# Patient Record
Sex: Male | Born: 1975 | Race: White | Hispanic: No | Marital: Single | Smoking: Former smoker
Health system: Southern US, Community
[De-identification: ages and names within clinical notes are randomized; demographics above are authoritative.]

## PROBLEM LIST (undated history)

## (undated) DIAGNOSIS — D6861 Antiphospholipid syndrome: Secondary | ICD-10-CM

## (undated) DIAGNOSIS — E119 Type 2 diabetes mellitus without complications: Secondary | ICD-10-CM

## (undated) DIAGNOSIS — Z87891 Personal history of nicotine dependence: Secondary | ICD-10-CM

## (undated) DIAGNOSIS — Z5189 Encounter for other specified aftercare: Secondary | ICD-10-CM

## (undated) DIAGNOSIS — Z0000 Encounter for general adult medical examination without abnormal findings: Principal | ICD-10-CM

## (undated) DIAGNOSIS — G4730 Sleep apnea, unspecified: Secondary | ICD-10-CM

## (undated) DIAGNOSIS — R760 Raised antibody titer: Secondary | ICD-10-CM

## (undated) DIAGNOSIS — T79A0XA Compartment syndrome, unspecified, initial encounter: Secondary | ICD-10-CM

## (undated) DIAGNOSIS — F209 Schizophrenia, unspecified: Secondary | ICD-10-CM

## (undated) DIAGNOSIS — H6120 Impacted cerumen, unspecified ear: Secondary | ICD-10-CM

## (undated) DIAGNOSIS — K5900 Constipation, unspecified: Secondary | ICD-10-CM

## (undated) DIAGNOSIS — E049 Nontoxic goiter, unspecified: Secondary | ICD-10-CM

## (undated) DIAGNOSIS — D649 Anemia, unspecified: Secondary | ICD-10-CM

## (undated) DIAGNOSIS — E669 Obesity, unspecified: Secondary | ICD-10-CM

## (undated) DIAGNOSIS — R030 Elevated blood-pressure reading, without diagnosis of hypertension: Secondary | ICD-10-CM

## (undated) DIAGNOSIS — F419 Anxiety disorder, unspecified: Secondary | ICD-10-CM

## (undated) DIAGNOSIS — L0390 Cellulitis, unspecified: Secondary | ICD-10-CM

## (undated) DIAGNOSIS — E785 Hyperlipidemia, unspecified: Secondary | ICD-10-CM

## (undated) DIAGNOSIS — F32A Depression, unspecified: Secondary | ICD-10-CM

## (undated) DIAGNOSIS — F329 Major depressive disorder, single episode, unspecified: Secondary | ICD-10-CM

## (undated) HISTORY — DX: Sleep apnea, unspecified: G47.30

## (undated) HISTORY — DX: Impacted cerumen, unspecified ear: H61.20

## (undated) HISTORY — PX: I & D EXTREMITY: SHX5045

## (undated) HISTORY — DX: Encounter for other specified aftercare: Z51.89

## (undated) HISTORY — PX: WISDOM TOOTH EXTRACTION: SHX21

## (undated) HISTORY — DX: Depression, unspecified: F32.A

## (undated) HISTORY — DX: Encounter for general adult medical examination without abnormal findings: Z00.00

## (undated) HISTORY — DX: Anxiety disorder, unspecified: F41.9

## (undated) HISTORY — DX: Cellulitis, unspecified: L03.90

## (undated) HISTORY — DX: Nontoxic goiter, unspecified: E04.9

## (undated) HISTORY — DX: Type 2 diabetes mellitus without complications: E11.9

## (undated) HISTORY — DX: Constipation, unspecified: K59.00

## (undated) HISTORY — DX: Obesity, unspecified: E66.9

## (undated) HISTORY — DX: Personal history of nicotine dependence: Z87.891

## (undated) HISTORY — DX: Raised antibody titer: R76.0

## (undated) HISTORY — DX: Anemia, unspecified: D64.9

## (undated) HISTORY — DX: Elevated blood-pressure reading, without diagnosis of hypertension: R03.0

## (undated) HISTORY — PX: TONSILLECTOMY: SUR1361

## (undated) HISTORY — DX: Hyperlipidemia, unspecified: E78.5

## (undated) HISTORY — PX: LEG SURGERY: SHX1003

## (undated) HISTORY — DX: Antiphospholipid syndrome: D68.61

## (undated) HISTORY — PX: COLONOSCOPY: SHX174

## (undated) HISTORY — DX: Schizophrenia, unspecified: F20.9

## (undated) HISTORY — PX: OTHER SURGICAL HISTORY: SHX169

---
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
## 1998-01-14 ENCOUNTER — Inpatient Hospital Stay (HOSPITAL_COMMUNITY): Admission: EM | Admit: 1998-01-14 | Discharge: 1998-02-04 | Payer: Self-pay | Admitting: Emergency Medicine

## 1998-04-15 ENCOUNTER — Inpatient Hospital Stay (HOSPITAL_COMMUNITY): Admission: RE | Admit: 1998-04-15 | Discharge: 1998-04-19 | Payer: Self-pay | Admitting: *Deleted

## 1999-02-24 ENCOUNTER — Inpatient Hospital Stay (HOSPITAL_COMMUNITY): Admission: EM | Admit: 1999-02-24 | Discharge: 1999-02-25 | Payer: Self-pay | Admitting: Emergency Medicine

## 1999-02-25 ENCOUNTER — Inpatient Hospital Stay (HOSPITAL_COMMUNITY): Admission: AD | Admit: 1999-02-25 | Discharge: 1999-03-21 | Payer: Self-pay | Admitting: *Deleted

## 2000-04-27 ENCOUNTER — Encounter: Payer: Self-pay | Admitting: Internal Medicine

## 2000-04-27 ENCOUNTER — Encounter: Admission: RE | Admit: 2000-04-27 | Discharge: 2000-04-27 | Payer: Self-pay | Admitting: Internal Medicine

## 2004-12-21 ENCOUNTER — Encounter: Admission: RE | Admit: 2004-12-21 | Discharge: 2004-12-21 | Payer: Self-pay | Admitting: Internal Medicine

## 2006-01-04 ENCOUNTER — Encounter: Admission: RE | Admit: 2006-01-04 | Discharge: 2006-01-04 | Payer: Self-pay

## 2006-03-29 ENCOUNTER — Encounter: Admission: RE | Admit: 2006-03-29 | Discharge: 2006-06-27 | Payer: Self-pay | Admitting: Psychiatry

## 2006-08-02 ENCOUNTER — Encounter: Admission: RE | Admit: 2006-08-02 | Discharge: 2006-10-31 | Payer: Self-pay | Admitting: Psychiatry

## 2006-10-30 ENCOUNTER — Encounter: Admission: RE | Admit: 2006-10-30 | Discharge: 2007-01-28 | Payer: Self-pay | Admitting: Psychiatry

## 2007-03-05 ENCOUNTER — Encounter: Admission: RE | Admit: 2007-03-05 | Discharge: 2007-06-03 | Payer: Self-pay | Admitting: Psychiatry

## 2007-06-27 ENCOUNTER — Encounter: Admission: RE | Admit: 2007-06-27 | Discharge: 2007-09-25 | Payer: Self-pay | Admitting: Psychiatry

## 2007-11-21 ENCOUNTER — Encounter: Admission: RE | Admit: 2007-11-21 | Discharge: 2008-02-19 | Payer: Self-pay | Admitting: Psychiatry

## 2008-04-16 ENCOUNTER — Encounter: Admission: RE | Admit: 2008-04-16 | Discharge: 2008-06-16 | Payer: Self-pay | Admitting: Psychiatry

## 2008-06-24 ENCOUNTER — Encounter: Admission: RE | Admit: 2008-06-24 | Discharge: 2008-07-27 | Payer: Self-pay | Admitting: Psychiatry

## 2008-07-28 ENCOUNTER — Encounter: Admission: RE | Admit: 2008-07-28 | Discharge: 2008-10-26 | Payer: Self-pay | Admitting: Psychiatry

## 2008-10-28 ENCOUNTER — Encounter: Admission: RE | Admit: 2008-10-28 | Discharge: 2008-10-28 | Payer: Self-pay | Admitting: Psychiatry

## 2009-01-04 ENCOUNTER — Encounter: Admission: RE | Admit: 2009-01-04 | Discharge: 2009-04-04 | Payer: Self-pay | Admitting: Psychiatry

## 2009-04-06 ENCOUNTER — Encounter: Admission: RE | Admit: 2009-04-06 | Discharge: 2009-07-05 | Payer: Self-pay | Admitting: Psychiatry

## 2009-07-06 ENCOUNTER — Encounter: Admission: RE | Admit: 2009-07-06 | Discharge: 2009-07-06 | Payer: Self-pay | Admitting: Psychiatry

## 2009-08-30 ENCOUNTER — Encounter: Admission: RE | Admit: 2009-08-30 | Discharge: 2009-08-30 | Payer: Self-pay | Admitting: Internal Medicine

## 2009-09-14 ENCOUNTER — Encounter: Admission: RE | Admit: 2009-09-14 | Discharge: 2009-09-22 | Payer: Self-pay | Admitting: Psychiatry

## 2009-11-09 ENCOUNTER — Encounter: Admission: RE | Admit: 2009-11-09 | Discharge: 2009-11-09 | Payer: Self-pay | Admitting: Psychiatry

## 2010-02-08 ENCOUNTER — Encounter: Admission: RE | Admit: 2010-02-08 | Discharge: 2010-02-08 | Payer: Self-pay | Admitting: Psychiatry

## 2010-05-11 ENCOUNTER — Encounter: Admission: RE | Admit: 2010-05-11 | Discharge: 2010-06-23 | Payer: Self-pay | Admitting: Psychiatry

## 2010-07-12 ENCOUNTER — Encounter
Admission: RE | Admit: 2010-07-12 | Discharge: 2010-07-12 | Payer: Self-pay | Source: Home / Self Care | Attending: Psychiatry | Admitting: Psychiatry

## 2010-10-13 ENCOUNTER — Encounter
Admission: RE | Admit: 2010-10-13 | Discharge: 2010-10-25 | Payer: Self-pay | Source: Home / Self Care | Attending: Psychiatry | Admitting: Psychiatry

## 2011-01-12 ENCOUNTER — Encounter: Payer: Medicare Other | Attending: Psychiatry | Admitting: *Deleted

## 2011-01-12 ENCOUNTER — Encounter: Admit: 2011-01-12 | Payer: Self-pay | Admitting: Psychiatry

## 2011-01-12 DIAGNOSIS — Z713 Dietary counseling and surveillance: Secondary | ICD-10-CM | POA: Insufficient documentation

## 2011-01-12 DIAGNOSIS — E669 Obesity, unspecified: Secondary | ICD-10-CM | POA: Insufficient documentation

## 2011-03-15 ENCOUNTER — Emergency Department (HOSPITAL_COMMUNITY): Payer: Medicare Other

## 2011-03-15 ENCOUNTER — Inpatient Hospital Stay (HOSPITAL_COMMUNITY)
Admission: EM | Admit: 2011-03-15 | Discharge: 2011-03-24 | DRG: 464 | Disposition: A | Discharge status: Home Health | Payer: Medicare Other | Attending: Internal Medicine | Admitting: Internal Medicine

## 2011-03-15 ENCOUNTER — Encounter (HOSPITAL_COMMUNITY): Payer: Self-pay | Admitting: Radiology

## 2011-03-15 DIAGNOSIS — IMO0001 Reserved for inherently not codable concepts without codable children: Secondary | ICD-10-CM | POA: Diagnosis present

## 2011-03-15 DIAGNOSIS — F209 Schizophrenia, unspecified: Secondary | ICD-10-CM | POA: Diagnosis present

## 2011-03-15 DIAGNOSIS — K5900 Constipation, unspecified: Secondary | ICD-10-CM | POA: Diagnosis present

## 2011-03-15 DIAGNOSIS — R7401 Elevation of levels of liver transaminase levels: Secondary | ICD-10-CM | POA: Diagnosis present

## 2011-03-15 DIAGNOSIS — D62 Acute posthemorrhagic anemia: Secondary | ICD-10-CM | POA: Diagnosis not present

## 2011-03-15 DIAGNOSIS — M7989 Other specified soft tissue disorders: Secondary | ICD-10-CM

## 2011-03-15 DIAGNOSIS — D509 Iron deficiency anemia, unspecified: Secondary | ICD-10-CM | POA: Diagnosis present

## 2011-03-15 DIAGNOSIS — M6282 Rhabdomyolysis: Secondary | ICD-10-CM | POA: Diagnosis present

## 2011-03-15 DIAGNOSIS — L03119 Cellulitis of unspecified part of limb: Secondary | ICD-10-CM | POA: Diagnosis present

## 2011-03-15 DIAGNOSIS — E871 Hypo-osmolality and hyponatremia: Secondary | ICD-10-CM | POA: Diagnosis present

## 2011-03-15 DIAGNOSIS — R7402 Elevation of levels of lactic acid dehydrogenase (LDH): Secondary | ICD-10-CM | POA: Diagnosis present

## 2011-03-15 DIAGNOSIS — E785 Hyperlipidemia, unspecified: Secondary | ICD-10-CM | POA: Diagnosis present

## 2011-03-15 DIAGNOSIS — L02419 Cutaneous abscess of limb, unspecified: Secondary | ICD-10-CM | POA: Diagnosis present

## 2011-03-15 DIAGNOSIS — R51 Headache: Secondary | ICD-10-CM | POA: Diagnosis not present

## 2011-03-15 DIAGNOSIS — M79A29 Nontraumatic compartment syndrome of unspecified lower extremity: Principal | ICD-10-CM | POA: Diagnosis present

## 2011-03-15 LAB — COMPREHENSIVE METABOLIC PANEL
ALT: 121 U/L — ABNORMAL HIGH (ref 0–53)
AST: 252 U/L — ABNORMAL HIGH (ref 0–37)
Albumin: 3.3 g/dL — ABNORMAL LOW (ref 3.5–5.2)
Alkaline Phosphatase: 50 U/L (ref 39–117)
BUN: 12 mg/dL (ref 6–23)
CO2: 26 mEq/L (ref 19–32)
Calcium: 8.4 mg/dL (ref 8.4–10.5)
Chloride: 94 mEq/L — ABNORMAL LOW (ref 96–112)
Creatinine, Ser: 0.84 mg/dL (ref 0.50–1.35)
GFR calc Af Amer: >60 mL/min (ref >60)
GFR calc non Af Amer: >60 mL/min (ref >60)
Glucose, Bld: 104 mg/dL — ABNORMAL HIGH (ref 70–99)
Potassium: 3.9 mEq/L (ref 3.5–5.1)
Sodium: 130 mEq/L — ABNORMAL LOW (ref 135–145)
Total Bilirubin: 0.3 mg/dL (ref 0.3–1.2)
Total Protein: 7 g/dL (ref 6.0–8.3)

## 2011-03-15 LAB — CBC
HCT: 35.8 % — ABNORMAL LOW (ref 39.0–52.0)
Hemoglobin: 12.2 g/dL — ABNORMAL LOW (ref 13.0–17.0)
MCH: 31 pg (ref 26.0–34.0)
MCHC: 34.1 g/dL (ref 30.0–36.0)
MCV: 90.9 fL (ref 78.0–100.0)
Platelets: 239 10*3/uL (ref 150–400)
RBC: 3.94 MIL/uL — ABNORMAL LOW (ref 4.22–5.81)
RDW: 12.8 % (ref 11.5–15.5)
WBC: 14.5 10*3/uL — ABNORMAL HIGH (ref 4.0–10.5)

## 2011-03-15 LAB — DIFFERENTIAL
Basophils Absolute: 0 10*3/uL (ref 0.0–0.1)
Basophils Relative: 0 % (ref 0–1)
Eosinophils Absolute: 0.1 10*3/uL (ref 0.0–0.7)
Eosinophils Relative: 1 % (ref 0–5)
Lymphocytes Relative: 11 % — ABNORMAL LOW (ref 12–46)
Lymphs Abs: 1.6 10*3/uL (ref 0.7–4.0)
Monocytes Absolute: 2 10*3/uL — ABNORMAL HIGH (ref 0.1–1.0)
Monocytes Relative: 14 % — ABNORMAL HIGH (ref 3–12)
Neutro Abs: 10.7 10*3/uL — ABNORMAL HIGH (ref 1.7–7.7)
Neutrophils Relative %: 74 % (ref 43–77)

## 2011-03-15 LAB — CK: Total CK: 10781 U/L — ABNORMAL HIGH (ref 7–232)

## 2011-03-15 MED ORDER — IOHEXOL 300 MG/ML  SOLN
100.0000 mL | Freq: Once | INTRAMUSCULAR | Status: AC | PRN
  Administered 2011-03-15: 100 mL via INTRAVENOUS

## 2011-03-16 ENCOUNTER — Inpatient Hospital Stay (HOSPITAL_COMMUNITY): Payer: Medicare Other

## 2011-03-16 ENCOUNTER — Other Ambulatory Visit: Payer: Self-pay | Admitting: Orthopedic Surgery

## 2011-03-16 LAB — BASIC METABOLIC PANEL
BUN: 13 mg/dL (ref 6–23)
CO2: 27 mEq/L (ref 19–32)
Calcium: 7.6 mg/dL — ABNORMAL LOW (ref 8.4–10.5)
Chloride: 103 mEq/L (ref 96–112)
Creatinine, Ser: 0.89 mg/dL (ref 0.50–1.35)
GFR calc Af Amer: >60 mL/min (ref >60)
GFR calc non Af Amer: >60 mL/min (ref >60)
Glucose, Bld: 109 mg/dL — ABNORMAL HIGH (ref 70–99)
Potassium: 4.1 mEq/L (ref 3.5–5.1)
Sodium: 137 mEq/L (ref 135–145)

## 2011-03-16 LAB — HEPATIC FUNCTION PANEL
ALT: 94 U/L — ABNORMAL HIGH (ref 0–53)
AST: 176 U/L — ABNORMAL HIGH (ref 0–37)
Albumin: 2.5 g/dL — ABNORMAL LOW (ref 3.5–5.2)
Alkaline Phosphatase: 53 U/L (ref 39–117)
Bilirubin, Direct: <0.1 mg/dL (ref 0.0–0.3)
Total Bilirubin: 0.2 mg/dL — ABNORMAL LOW (ref 0.3–1.2)
Total Protein: 5.7 g/dL — ABNORMAL LOW (ref 6.0–8.3)

## 2011-03-16 LAB — POCT I-STAT 3, ART BLOOD GAS (G3+)
Acid-Base Excess: 1 mmol/L (ref 0.0–2.0)
Bicarbonate: 27.5 mEq/L — ABNORMAL HIGH (ref 20.0–24.0)
O2 Saturation: 95 %
Patient temperature: 98.6
TCO2: 29 mmol/L (ref 0–100)
pCO2 arterial: 50.3 mmHg — ABNORMAL HIGH (ref 35.0–45.0)
pH, Arterial: 7.345 — ABNORMAL LOW (ref 7.350–7.450)
pO2, Arterial: 81 mmHg (ref 80.0–100.0)

## 2011-03-16 LAB — CBC
HCT: 31.8 % — ABNORMAL LOW (ref 39.0–52.0)
Hemoglobin: 10.5 g/dL — ABNORMAL LOW (ref 13.0–17.0)
MCH: 30.5 pg (ref 26.0–34.0)
MCHC: 33 g/dL (ref 30.0–36.0)
MCV: 92.4 fL (ref 78.0–100.0)
Platelets: 217 10*3/uL (ref 150–400)
RBC: 3.44 MIL/uL — ABNORMAL LOW (ref 4.22–5.81)
RDW: 13.1 % (ref 11.5–15.5)
WBC: 10 10*3/uL (ref 4.0–10.5)

## 2011-03-16 LAB — DIFFERENTIAL
Basophils Absolute: 0 10*3/uL (ref 0.0–0.1)
Basophils Relative: 0 % (ref 0–1)
Eosinophils Absolute: 0.1 10*3/uL (ref 0.0–0.7)
Eosinophils Relative: 1 % (ref 0–5)
Lymphocytes Relative: 12 % (ref 12–46)
Lymphs Abs: 1.2 10*3/uL (ref 0.7–4.0)
Monocytes Absolute: 1.4 10*3/uL — ABNORMAL HIGH (ref 0.1–1.0)
Monocytes Relative: 14 % — ABNORMAL HIGH (ref 3–12)
Neutro Abs: 7.4 10*3/uL (ref 1.7–7.7)
Neutrophils Relative %: 74 % (ref 43–77)

## 2011-03-16 LAB — MRSA PCR SCREENING: MRSA by PCR: NEGATIVE

## 2011-03-16 LAB — AMMONIA: Ammonia: 45 umol/L (ref 11–60)

## 2011-03-16 LAB — VALPROIC ACID LEVEL: Valproic Acid Lvl: 24.3 ug/mL — ABNORMAL LOW (ref 50.0–100.0)

## 2011-03-16 LAB — MAGNESIUM: Magnesium: 2.1 mg/dL (ref 1.5–2.5)

## 2011-03-17 ENCOUNTER — Ambulatory Visit: Payer: Medicare Other | Admitting: Internal Medicine

## 2011-03-17 ENCOUNTER — Inpatient Hospital Stay (HOSPITAL_COMMUNITY): Payer: Medicare Other

## 2011-03-17 LAB — DIFFERENTIAL
Basophils Absolute: 0 10*3/uL (ref 0.0–0.1)
Basophils Relative: 0 % (ref 0–1)
Eosinophils Absolute: 0.2 10*3/uL (ref 0.0–0.7)
Eosinophils Relative: 2 % (ref 0–5)
Lymphocytes Relative: 15 % (ref 12–46)
Lymphs Abs: 1.4 10*3/uL (ref 0.7–4.0)
Monocytes Absolute: 1.5 10*3/uL — ABNORMAL HIGH (ref 0.1–1.0)
Monocytes Relative: 16 % — ABNORMAL HIGH (ref 3–12)
Neutro Abs: 6.2 10*3/uL (ref 1.7–7.7)
Neutrophils Relative %: 67 % (ref 43–77)

## 2011-03-17 LAB — CBC
HCT: 29.2 % — ABNORMAL LOW (ref 39.0–52.0)
Hemoglobin: 10.1 g/dL — ABNORMAL LOW (ref 13.0–17.0)
MCH: 32.2 pg (ref 26.0–34.0)
MCHC: 34.6 g/dL (ref 30.0–36.0)
MCV: 93 fL (ref 78.0–100.0)
Platelets: 234 10*3/uL (ref 150–400)
RBC: 3.14 MIL/uL — ABNORMAL LOW (ref 4.22–5.81)
RDW: 13.2 % (ref 11.5–15.5)
WBC: 9.2 10*3/uL (ref 4.0–10.5)

## 2011-03-17 LAB — COMPREHENSIVE METABOLIC PANEL
ALT: 83 U/L — ABNORMAL HIGH (ref 0–53)
AST: 94 U/L — ABNORMAL HIGH (ref 0–37)
Albumin: 2.3 g/dL — ABNORMAL LOW (ref 3.5–5.2)
Alkaline Phosphatase: 55 U/L (ref 39–117)
BUN: 11 mg/dL (ref 6–23)
CO2: 29 mEq/L (ref 19–32)
Calcium: 7.9 mg/dL — ABNORMAL LOW (ref 8.4–10.5)
Chloride: 101 mEq/L (ref 96–112)
Creatinine, Ser: 0.81 mg/dL (ref 0.50–1.35)
GFR calc Af Amer: >60 mL/min (ref >60)
GFR calc non Af Amer: >60 mL/min (ref >60)
Glucose, Bld: 185 mg/dL — ABNORMAL HIGH (ref 70–99)
Potassium: 3.9 mEq/L (ref 3.5–5.1)
Sodium: 135 mEq/L (ref 135–145)
Total Bilirubin: 0.1 mg/dL — ABNORMAL LOW (ref 0.3–1.2)
Total Protein: 5.6 g/dL — ABNORMAL LOW (ref 6.0–8.3)

## 2011-03-17 LAB — HEPATITIS PANEL, ACUTE
HCV Ab: NEGATIVE
Hep A IgM: NEGATIVE
Hep B C IgM: NEGATIVE
Hepatitis B Surface Ag: NEGATIVE

## 2011-03-17 LAB — PRO B NATRIURETIC PEPTIDE: Pro B Natriuretic peptide (BNP): 46.9 pg/mL (ref 0–125)

## 2011-03-17 LAB — PHOSPHORUS: Phosphorus: 2.9 mg/dL (ref 2.3–4.6)

## 2011-03-17 LAB — MAGNESIUM: Magnesium: 2.2 mg/dL (ref 1.5–2.5)

## 2011-03-17 LAB — CK: Total CK: 3523 U/L — ABNORMAL HIGH (ref 7–232)

## 2011-03-18 LAB — CK: Total CK: 1008 U/L — ABNORMAL HIGH (ref 7–232)

## 2011-03-18 LAB — CBC
HCT: 26.8 % — ABNORMAL LOW (ref 39.0–52.0)
Hemoglobin: 8.9 g/dL — ABNORMAL LOW (ref 13.0–17.0)
MCH: 30.7 pg (ref 26.0–34.0)
MCHC: 33.2 g/dL (ref 30.0–36.0)
MCV: 92.4 fL (ref 78.0–100.0)
Platelets: 267 10*3/uL (ref 150–400)
RBC: 2.9 MIL/uL — ABNORMAL LOW (ref 4.22–5.81)
RDW: 13 % (ref 11.5–15.5)
WBC: 8.7 10*3/uL (ref 4.0–10.5)

## 2011-03-19 LAB — VITAMIN B12: Vitamin B-12: 589 pg/mL (ref 211–911)

## 2011-03-19 LAB — CK TOTAL AND CKMB (NOT AT ARMC)
CK, MB: 2.4 ng/mL (ref 0.3–4.0)
Relative Index: 0.7 (ref 0.0–2.5)
Total CK: 360 U/L — ABNORMAL HIGH (ref 7–232)

## 2011-03-19 LAB — COMPREHENSIVE METABOLIC PANEL
ALT: 50 U/L (ref 0–53)
AST: 26 U/L (ref 0–37)
Albumin: 2 g/dL — ABNORMAL LOW (ref 3.5–5.2)
Alkaline Phosphatase: 43 U/L (ref 39–117)
BUN: 12 mg/dL (ref 6–23)
CO2: 29 mEq/L (ref 19–32)
Calcium: 8 mg/dL — ABNORMAL LOW (ref 8.4–10.5)
Chloride: 103 mEq/L (ref 96–112)
Creatinine, Ser: 0.8 mg/dL (ref 0.50–1.35)
GFR calc Af Amer: >60 mL/min (ref >60)
GFR calc non Af Amer: >60 mL/min (ref >60)
Glucose, Bld: 101 mg/dL — ABNORMAL HIGH (ref 70–99)
Potassium: 3.8 mEq/L (ref 3.5–5.1)
Sodium: 139 mEq/L (ref 135–145)
Total Bilirubin: 0.1 mg/dL — ABNORMAL LOW (ref 0.3–1.2)
Total Protein: 5.4 g/dL — ABNORMAL LOW (ref 6.0–8.3)

## 2011-03-19 LAB — CBC
HCT: 25.9 % — ABNORMAL LOW (ref 39.0–52.0)
Hemoglobin: 9 g/dL — ABNORMAL LOW (ref 13.0–17.0)
MCH: 31.7 pg (ref 26.0–34.0)
MCHC: 34.7 g/dL (ref 30.0–36.0)
MCV: 91.2 fL (ref 78.0–100.0)
Platelets: 281 10*3/uL (ref 150–400)
RBC: 2.84 MIL/uL — ABNORMAL LOW (ref 4.22–5.81)
RDW: 13 % (ref 11.5–15.5)
WBC: 7.9 10*3/uL (ref 4.0–10.5)

## 2011-03-19 LAB — DIFFERENTIAL
Basophils Absolute: 0 10*3/uL (ref 0.0–0.1)
Basophils Relative: 0 % (ref 0–1)
Eosinophils Absolute: 0.5 10*3/uL (ref 0.0–0.7)
Eosinophils Relative: 6 % — ABNORMAL HIGH (ref 0–5)
Lymphocytes Relative: 30 % (ref 12–46)
Lymphs Abs: 2.4 10*3/uL (ref 0.7–4.0)
Monocytes Absolute: 0.8 10*3/uL (ref 0.1–1.0)
Monocytes Relative: 10 % (ref 3–12)
Neutro Abs: 4.3 10*3/uL (ref 1.7–7.7)
Neutrophils Relative %: 54 % (ref 43–77)

## 2011-03-19 LAB — IRON AND TIBC
Iron: 28 ug/dL — ABNORMAL LOW (ref 42–135)
Saturation Ratios: 14 % — ABNORMAL LOW (ref 20–55)
TIBC: 204 ug/dL — ABNORMAL LOW (ref 215–435)
UIBC: 176 ug/dL

## 2011-03-19 LAB — VANCOMYCIN, TROUGH: Vancomycin Tr: 5.8 ug/mL — ABNORMAL LOW (ref 10.0–20.0)

## 2011-03-19 LAB — HIV ANTIBODY (ROUTINE TESTING W REFLEX): HIV: NONREACTIVE

## 2011-03-19 LAB — FOLATE: Folate: 9.7 ng/mL

## 2011-03-19 LAB — FERRITIN: Ferritin: 190 ng/mL (ref 22–322)

## 2011-03-20 ENCOUNTER — Inpatient Hospital Stay (HOSPITAL_COMMUNITY): Payer: Medicare Other

## 2011-03-20 LAB — CBC
HCT: 22.1 % — ABNORMAL LOW (ref 39.0–52.0)
Hemoglobin: 7.5 g/dL — ABNORMAL LOW (ref 13.0–17.0)
MCH: 31 pg (ref 26.0–34.0)
MCHC: 33.9 g/dL (ref 30.0–36.0)
MCV: 91.3 fL (ref 78.0–100.0)
Platelets: 282 10*3/uL (ref 150–400)
RBC: 2.42 MIL/uL — ABNORMAL LOW (ref 4.22–5.81)
RDW: 13.2 % (ref 11.5–15.5)
WBC: 8.1 10*3/uL (ref 4.0–10.5)

## 2011-03-20 LAB — BASIC METABOLIC PANEL
BUN: 11 mg/dL (ref 6–23)
CO2: 30 mEq/L (ref 19–32)
Calcium: 7.8 mg/dL — ABNORMAL LOW (ref 8.4–10.5)
Chloride: 104 mEq/L (ref 96–112)
Creatinine, Ser: 0.74 mg/dL (ref 0.50–1.35)
GFR calc Af Amer: >60 mL/min (ref >60)
GFR calc non Af Amer: >60 mL/min (ref >60)
Glucose, Bld: 103 mg/dL — ABNORMAL HIGH (ref 70–99)
Potassium: 3.8 mEq/L (ref 3.5–5.1)
Sodium: 138 mEq/L (ref 135–145)

## 2011-03-20 LAB — ABO/RH: ABO/RH(D): O NEG

## 2011-03-20 LAB — HEMOGLOBIN: Hemoglobin: 8.2 g/dL — ABNORMAL LOW (ref 13.0–17.0)

## 2011-03-20 NOTE — H&P (Signed)
Joseph [name], Joseph [name]                ACCOUNT NO.:  [id number]  MEDICAL RECORD NO.:  [id number]  LOCATION:  [facility]                         FACILITY:  [facility]  PHYSICIAN:  Eduard Clos, MDDATE OF BIRTH:  1975-10-20  DATE OF ADMISSION:  03/15/2011 DATE OF DISCHARGE:                             HISTORY & PHYSICAL   PRIMARY CARE PHYSICIAN:  [facility] [place].  CHIEF COMPLAINT:  Left leg pain and swelling.  HISTORY OF PRESENT ILLNESS:  A 35 year old male with a history of schizophrenia and hyperlipidemia has been experiencing some swelling in the left lower extremity on Saturday, that is almost 5 days ago.  The patient states that the weekend he did mow the lawn.  He did not notice any trauma or any insect bite.  His left anterior shin started swelling slowly with fever and chills which progressively got worst involving his left foot.  He came to the ER yesterday and he had Dopplers done which is not showing a definite DVT.  His CBC shows leukocytosis, so at this time he has been admitted for cellulitis.  CAT scan of the left lower extremity done eventually showed features of cellulitis and myositis. His CK has been very high at this time and is also concerning for any compartment syndrome.  Orthopedic Dr. Dorene Grebe has been already consulted by ER physician who is going to see the patient soon.  The patient does have some pain in the left lower extremity which is relieved by Dilaudid.  Denies any chest pain, shortness of breath, nausea, vomiting, abdominal pain, dysuria, discharge, diarrhea.  Denies any dizziness or loss of consciousness.  PAST MEDICAL HISTORY:  History of schizophrenia and hyperlipidemia.  MEDICATIONS PRIOR TO ADMISSION: 1. Aleve 220 mg as needed p.r.n. 2. Clonazepam 1 mg p.o. at bedtime. 3. Depakote 1000 mg at bedtime. 4. Docusate. 5. Fenofibrate 134 mg p.o. at bedtime. 6. Klonopin 1 mg twice daily. 7. Multivitamins p.o. daily. 8. Pravastatin 40 mg  daily. 9. Prozac 20 mg daily. 10.Zyprexa 20 mg at bedtime.  ALLERGIES:  HALDOL.  SOCIAL HISTORY:  The patient denies smoking cigarettes, drinking alcohol or use illegal drugs.  Lives in a group home.  FAMILY HISTORY:  Significant for grandparents having MI.  REVIEW OF SYSTEMS:  As per the history of present illness nothing else significant.  PHYSICAL EXAMINATION:  GENERAL:  The patient examined at bedside not in acute distress. VITAL SIGNS:  Blood pressure 112/70, pulse is 97 per minute, temperature 98.6, respirations 18 per minute, O2 sat 94%. HEENT:  Anicteric.  No pallor.  No discharge from ears, eyes, nose or mouth. CHEST:  Bilateral air entry present.  No rhonchi.  No crepitation. HEART:  S1 and S2 heard. ABDOMEN:  Soft, nontender.  Bowel sounds heard. CNS:  The patient is alert, awake and oriented to time, place, and person.  Moves upper and lower extremity 5/5. EXTREMITIES:  There is swelling in the left anterior shin up to his left foot.  In the anterior shin has a very erythematous area involving 10 cm to 5 cm which is also tender.  At this time does not show any acute ischemic changes, cyanosis or clubbing.  Pulses  are feeble.  Right lower extremity looks normal.  Upper extremities look normal.  LABORATORY FINDINGS:  Doppler of the lower extremities done, preliminary report shows his left lower extremity venous completed.  No obvious evidence of DVT, superficial thrombosis or Baker cyst in the left lower extremity.  Cannot completely rule out DVT in the left PTV and peroneal veins due to nonvisualization.  Swelling and pressure in calf may be compressing them.  CT tibia and fibula of the left lower extremity. Impression, findings consistent with cellulitis, edema in the tibialis, anterior extensor digitorum longus and tibialis posterior muscles is consistent with myositis.  No discrete rim-enhancing fluid collection to suggest abscess as identified.  CBC; WBCs 14.5,  hemoglobin is 2.2, hematocrit is 35.8, platelets 239.  Complete metabolic panel; sodium 130, potassium 3.9, chloride 94, carbon dioxide 26, glucose 104, BUN 12, creatinine 0.8, total bilirubin is 0.3, alka phos 50, AST 252, ALT 121, total protein 7, albumin 3.3, calcium 8.4, [place] [address].  ASSESSMENT: 1. Cellulitis of the left lower extremity with myositis. 2. Clinical features concerning for compartment syndrome. 3. Increased CPK and LFTs, may be related to rhabdomyolysis and also     #2 diagnosis. 4. History of schizophrenia. 5. History of hyperlipidemia.  PLAN: 1. At this time I will admit the patient to medical floor. 2. For his cellulitis, the patient has been already started on vanc     and Zosyn without clindamycin.  At this time patient's CAT scan     does show features of myositis and CK being high is concerning for     compartment syndrome.  At this time the patient does have sensation     in his left lower extremity.  Dr. Dorene Grebe of orthopedic has been     already consulted by ER physician Dr. Denton Lank.  Dr. Dorene Grebe is     going to see the patient very soon to make sure there is no     compartment syndrome.  He will follow the recommendations with     regarding this.  At this time his CK is high, his LFTs also high     which may be part of his rhabdo secondary to his myositis.  I am     going to aggressively hydrate the patient and get a UA, closely     follow his CK and his LFTs.  At this time I am ordering acute     hepatitis panel.  As the patient is on pravastatin, we will     discontinue     because of his high CK levels.  His LFTs also high.  We will check     ammonia level as the patient is also on Depakote levels.  His LFTs     may be high because of the rhabdomyolysis.  We will closely follow     his LFTs and CK and further recommendation as condition evolves.     Eduard Clos, MD     ANK/MEDQ  D:  03/16/2011  T:  03/16/2011  Job:   [id number]  cc:   [facility]  Electronically Signed by Midge Minium MD on 03/20/2011 07:45:48 AM

## 2011-03-21 LAB — CBC
HCT: 22 % — ABNORMAL LOW (ref 39.0–52.0)
Hemoglobin: 7.4 g/dL — ABNORMAL LOW (ref 13.0–17.0)
MCH: 30.6 pg (ref 26.0–34.0)
MCHC: 33.6 g/dL (ref 30.0–36.0)
MCV: 90.9 fL (ref 78.0–100.0)
Platelets: 316 10*3/uL (ref 150–400)
RBC: 2.42 MIL/uL — ABNORMAL LOW (ref 4.22–5.81)
RDW: 13.5 % (ref 11.5–15.5)
WBC: 9.2 10*3/uL (ref 4.0–10.5)

## 2011-03-21 LAB — POCT I-STAT 4, (NA,K, GLUC, HGB,HCT)
Glucose, Bld: 128 mg/dL — ABNORMAL HIGH (ref 70–99)
HCT: 30 % — ABNORMAL LOW (ref 39.0–52.0)
Hemoglobin: 10.2 g/dL — ABNORMAL LOW (ref 13.0–17.0)
Potassium: 4.5 mEq/L (ref 3.5–5.1)
Sodium: 137 mEq/L (ref 135–145)

## 2011-03-21 LAB — PREPARE RBC (CROSSMATCH)

## 2011-03-22 LAB — CULTURE, BLOOD (ROUTINE X 2)
Culture  Setup Time: 201206210319
Culture  Setup Time: 201206210319
Culture: NO GROWTH
Culture: NO GROWTH

## 2011-03-22 LAB — TYPE AND SCREEN
ABO/RH(D): O NEG
Antibody Screen: NEGATIVE
Unit division: 0
Unit division: 0

## 2011-03-22 LAB — CBC
HCT: 26.9 % — ABNORMAL LOW (ref 39.0–52.0)
Hemoglobin: 8.9 g/dL — ABNORMAL LOW (ref 13.0–17.0)
MCH: 30 pg (ref 26.0–34.0)
MCHC: 33.1 g/dL (ref 30.0–36.0)
MCV: 90.6 fL (ref 78.0–100.0)
Platelets: 362 10*3/uL (ref 150–400)
RBC: 2.97 MIL/uL — ABNORMAL LOW (ref 4.22–5.81)
RDW: 14.6 % (ref 11.5–15.5)
WBC: 8.9 10*3/uL (ref 4.0–10.5)

## 2011-03-22 LAB — BASIC METABOLIC PANEL
BUN: 13 mg/dL (ref 6–23)
CO2: 26 mEq/L (ref 19–32)
Calcium: 8.1 mg/dL — ABNORMAL LOW (ref 8.4–10.5)
Chloride: 109 mEq/L (ref 96–112)
Creatinine, Ser: 0.95 mg/dL (ref 0.50–1.35)
GFR calc Af Amer: >60 mL/min (ref >60)
GFR calc non Af Amer: >60 mL/min (ref >60)
Glucose, Bld: 128 mg/dL — ABNORMAL HIGH (ref 70–99)
Potassium: 3.8 mEq/L (ref 3.5–5.1)
Sodium: 143 mEq/L (ref 135–145)

## 2011-03-22 LAB — POCT OCCULT BLOOD STOOL (DEVICE): Fecal Occult Bld: NEGATIVE

## 2011-03-22 LAB — VANCOMYCIN, TROUGH: Vancomycin Tr: 9.9 ug/mL — ABNORMAL LOW (ref 10.0–20.0)

## 2011-03-25 NOTE — Discharge Summary (Signed)
  Joseph [name], Joseph [name]                ACCOUNT NO.:  [id number]  MEDICAL RECORD NO.:  [id number]  LOCATION:  5008                         FACILITY:  [facility]  PHYSICIAN:  Lonia Blood, M.D.       DATE OF BIRTH:  1976-03-11  DATE OF ADMISSION:  03/15/2011 DATE OF DISCHARGE:  03/25/2011                              DISCHARGE SUMMARY   PRIMARY CARE PHYSICIAN:  Cala Bradford R. Renae Gloss, MD with [facility].  DISCHARGE DIAGNOSES: 1. Left lower extremity cellulitis with myositis in compartments     syndrome, status post fasciotomy by Dr. Dorene Grebe from     Orthopedics. 2. Acute blood loss anemia postoperatively. 3. Hyperlipidemia. 4. Schizophrenia. 5. Constipation.  DISCHARGE MEDICATIONS: 1. Aleve 1 tablet by mouth twice a day as needed for pain. 2. Klonopin 1 mg by mouth twice a day and 1 mg at bedtime. 3. Depakote Extended Release 1000 mg at bedtime. 4. Colace 100 mg twice a day. 5. Fenofibric acid 135 mg daily. 6. Fluconazole 100 mg daily for 10 days. 7. Multivitamin 1 tablet daily. 8. Percocet 5/325 one to two tablets every 4 hours as needed for pain. 9. Pravachol 40 mg daily. 10.Prozac 20 mg daily. 11.Septra Double Strength 800/160 two tablets twice a day. 12.Flomax 0.4 mg daily. 13.Zyprexa 20 mg at bedtime.  CONDITION ON DISCHARGE:  Joseph [name] is going to be discharged home under the care of his family with home health wound care.  He will follow up with Dr. Dorene Grebe on March 30, 2011, in the office for followup on his leg.  For complete hospital course including radiographic studies, procedures performed, history of present illness, and hospital course, refer to dictated progress note done by Dr. Hartley Barefoot on March 21, 2011.  On March 24, 2011, Joseph [name] wound [facility] was removed.  The wound was found to be healing nicely.  He was dressed as per the instructions of Dr. August Saucer.  The patient had no other complications since the dictation done by Dr. Sunnie Nielsen on March 21, 2011.     Lonia Blood, M.D.     SL/MEDQ  D:  03/24/2011  T:  03/25/2011  Job:  [id number]  Electronically Signed by Lonia Blood M.D. on 03/25/2011 04:47:28 PM

## 2011-03-26 NOTE — Group Therapy Note (Signed)
Joseph [name], Joseph [name]                ACCOUNT NO.:  [id number]  MEDICAL RECORD NO.:  [id number]  LOCATION:                                 FACILITY:  PHYSICIAN:  Hartley Barefoot, MD    DATE OF BIRTH:  1975-10-19                                PROGRESS NOTE   CURRENT DIAGNOSES: 1. Left lower extremity cellulitis, myositis, compartment syndrome. 2. Anemia, acute blood loss postsurgery expected. 3. Hyperlipidemia. 4. Transaminases in the setting of increased total CK. 5. Hyponatremia. 6. Constipation. 7. Schizophrenia.  CURRENT MEDICATIONS: 1. Klonopin 1 mg p.o. b.i.d. 2. Depakote 1000 mg nightly. 3. Docusate 100 mg p.o. b.i.d. 4. Ferrous sulfate 325 mg p.o. b.i.d. 5. Prozac 20 mg p.o. daily. 6. Multivitamins p.o. daily. 7. Zyprexa 20 mg p.o. daily. 8. Zofran. 9. MiraLax p.o. b.i.d. 10.Flomax 0.4 mg p.o. daily. 11.Vancomycin per pharmacy.  RADIOGRAPHIC STUDY: 1. Abdominal x-ray on June 25 showed no acute or specific finding. 2. Chest x-ray June 22, improved air aeration.  No definitive     pneumonia.  Stable cardiomegaly. 3. CT of tibia and fibula left show on June 20, finding consistent     with cellulitis.  Edema in the tibialis anterior, extensor     digitorum longus and tibialis posterior muscle is consistent with     myositis.  No discrete rim enhancing fluid collection to suggest     abscess is identified.  PROCEDURES PERFORMED: 1. On June 21, left leg full compartment fasciotomy with debridement     of anterior compartment devitalized muscle. 2. June 22, debridement of the left leg devitalized anterior     compartment muscle with replication of a wound VAC 3. June 24, left leg I and D with delayed primary closure, a split     thickness skin graft 25 cm x 4 cm.  BRIEF HISTORY OF PRESENT ILLNESS: This is a very pleasant 35 year old with past medical history of schizophrenia, hyperlipidemia has been experiencing some swelling in the left lower extremity that  started 5 days prior to admission.  The patient states that the weekend he did mow the lawn.  He did not notice any trauma or any insect bite.  Hs left anterior shin is sweating slowly with fever and chills which progressively got worse involving his left foot.  He came to the emergency department.  His white blood cell showed leukocytosis,  so at this time he has been admitted for cellulitis.  A CT scan of the left lower extremity show features of cellulitis and myositis.  His CK has been very elevated and he has some clinical findings concerning for compartment syndrome.  Orthopedic Dr. Dorene Grebe has been already consulted by the ER physician who is going to see the patient soon.  HOSPITAL COURSE: 1. Left lower extremity cellulitis, myositis with compartment     syndrome.  The patient was admitted to regular floor.  He was taken     to the OR on the date of admission and he had a left leg full     compartment fasciotomy with debridement of anterior compartment     devitalized muscle.  He has had 2 more debridement procedures.  The     patient has been on IV antibiotics, today he is day 6 of IV van,     Zosyn and clindamycin.  I will discontinue clindamycin today.  I     discussed with Dr. August Saucer and he was recommending to continue the IV     antibiotics while the patient is in the hospital.  We can consider     to discharge him on oral antibiotics but that will depend on how     further debridement and how the tissue looks.  So we will need to     follow Dr. Diamantina Providence recommendation to see if the patient is going to     be able to go home on oral antibiotics. 2. Anemia, acute blood loss expected postsurgery.  The patient's     hemoglobin on admission was 12.2.  Over the course of     hospitalization has decreased.  Today is at 7.4.  I will transfuse     2 units of packed red blood cell.  He was started on ferrous     sulfate.  He had an anemia panel that had some features of iron      deficiency anemia.  We will guaiac stools. 3. Hyperlipidemia, hold statin in the setting of increased total CK. 4. Transaminases resolved.  It has normalized.  A viral hepatitis     panel negative.  I suspect elevated liver enzymes in the setting of     an elevated total CK and muscle enzymes test. 5. Constipation.  KUB was negative.  The patient had a bowel movement     on June 25.  We will continue with bowel regimen. 6. Elevated CK, early rhabdo secondary to cellulitis compartment     syndrome.  The patient's CK level on admission was at 1078 but     over the course of hospitalization has decreased to 360. 7. Hyponatremia.  On admission, the patient's sodium was 130.  He     received IV fluids. Hyponatremia has resolved. 9. History of schizophrenia.  We will continue with home medications.     He is stable at this time.       Hartley Barefoot, MD     BR/MEDQ  D:  03/21/2011  T:  03/22/2011  Job:  [id number]  Electronically Signed by Hartley Barefoot MD on 03/26/2011 09:06:24 AM

## 2011-04-07 NOTE — Op Note (Signed)
  [name], [name]                ACCOUNT NO.:  [id number]  MEDICAL RECORD NO.:  [id number]  LOCATION:  5008                         FACILITY:  [facility]  PHYSICIAN:  Burnard Bunting, M.D.    DATE OF BIRTH:  1976-05-19  DATE OF PROCEDURE:  03/17/2011 DATE OF DISCHARGE:                              OPERATIVE REPORT   PREOPERATIVE DIAGNOSIS:  Left leg compartment syndrome.  POSTOPERATIVE DIAGNOSIS:  Left leg compartment syndrome.  PROCEDURE:  Debridement of left leg devitalized anterior compartment muscle with reapplication of wound VAC.  SURGEON:  Burnard Bunting, MD  ASSISTANT:  None.  ANESTHESIA:  General endotracheal.  ESTIMATED BLOOD LOSS:  200 mL.  DRAINS:  None.  Wound VAC applied.  INDICATIONS:  Joseph [name] is a patient 2 days out, left 4-compartment fasciotomy, did have some dead muscle in the anterior compartment which was debrided at the initial setting, presents now for repeat debridement.  The patient presents now for repeat debridement, possible delayed primary closure.  PROCEDURE IN DETAIL:  The patient was brought to the operating room where general endotracheal anesthesia was induced.  Preoperative antibiotics remained.  Time-out was called.  Left leg was prescrubbed with Hibiclens and draped in sterile manner.  The anterior compartment muscle which remained after the initial compartment release and debridement was completely devitalized, appeared dusky without bleed, would not contract, it was removed.  All muscle from the anterior compartment required debridement.  The incisions were extended distally and proximally. Neurovascular bundle was maintained.  Following a debridement, thorough irrigation was performed and a thrombin spray was applied.  The wound was reapproximated.  About a 25 x 4 cm strip of lateral muscle remained exposed.  Plan is to bring him back in 2-3 days for repeat closure versus skin grafting.    Burnard Bunting, M.D.    GSD/MEDQ   D:  03/17/2011  T:  03/18/2011  Job:  [id number]  Electronically Signed by Reece Agar.  Jiro Kiester M.D. on 04/07/2011 05:38:07 PM

## 2011-04-07 NOTE — Op Note (Signed)
  Joseph [name], Joseph [name]                ACCOUNT NO.:  [id number]  MEDICAL RECORD NO.:  [id number]  LOCATION:                                 FACILITY:  PHYSICIAN:  Burnard Bunting, M.D.    DATE OF BIRTH:  1975/12/10  DATE OF PROCEDURE:  03/19/2011 DATE OF DISCHARGE:                              OPERATIVE REPORT   PREOPERATIVE DIAGNOSIS:  Left leg compartment syndrome with devitalized anterior compartment muscle.  POSTOPERATIVE DIAGNOSIS:  Left leg compartment syndrome with devitalized anterior compartment muscle.  PROCEDURE:  Left leg I and D with delayed primary closure, split thickness skin graft 25 cm x 4 cm.  SURGEON:  Burnard Bunting, MD  ASSISTANT:  None.  ANESTHESIA:  General endotracheal.  BLOOD LOSS:  150 mL.  DRAINS:  None.  INDICATIONS:  This is a third operative trip for Joseph [name] who is a patient with compartment syndrome and who had excision of his anterior compartment musculature and he presents now for continued operative management of this problem.  PROCEDURE IN DETAIL:  The patient was brought to the operating room where general endotracheal anesthesia was induced.  Preoperative IV antibiotics were maintained.  Time-out was called.  Left leg was scrubbed with Hibiclens and saline and draped in sterile manner.  The lateral posterior compartment muscle was viable and intact, it did contract with cauterization.  There was serous fluid collection within the anterior compartment which had several dead space.  Anterior tibial artery remains intact.  Debridement was performed using curettes.  No more devitalized muscle was noted distally or proximally.  Serous fluid collection was removed and 6 liters of irrigating solution were used to irrigate out this anterior compartment bed.  The fascia overlying the tibia in the lateral compartment was then used to close over a Hemovac drain, the dead space which was the anterior compartment.  The skin was then  reapproximating using #2 Nylon sutures.  The strip 4 cm x 25 cm were made and that was covered by split-thickness skin graft which was sutured with chromic suture into position.  The wound VAC was reapplied over this skin graft bed.  The patient tolerated procedure well without immediate complication.  Bulky wrap was applied.  Mepilex was applied to the donor site.     Burnard Bunting, M.D.     GSD/MEDQ  D:  03/21/2011  T:  03/21/2011  Job:  [id number]  Electronically Signed by Reece Agar.  Vaden Becherer M.D. on 04/07/2011 05:38:11 PM

## 2011-04-07 NOTE — Op Note (Signed)
  NAMETEHRAN, RABENOLD                ACCOUNT NO.:  [id number]  MEDICAL RECORD NO.:  [id number]  LOCATION:  3302                         FACILITY:  [facility]  PHYSICIAN:  Burnard Bunting, M.D.    DATE OF BIRTH:  Oct 07, 1975  DATE OF PROCEDURE:  03/16/2011 DATE OF DISCHARGE:                              OPERATIVE REPORT   PREOPERATIVE DIAGNOSIS:  Left leg compartment syndrome.  POSTOPERATIVE DIAGNOSIS:  Left leg compartment syndrome with dead anterior compartment muscle.  PROCEDURE:  Left leg full compartment fasciotomy with debridement of anterior compartment devitalized muscle.  SURGEON:  Burnard Bunting, MD  ASSISTANT:  None.  ANESTHESIA:  General endotracheal.  ESTIMATED BLOOD LOSS:  100 mL.  DRAINS:  None.  HEMOVAC:  None.  Wound VAC was utilized to the lateral compartment.  INDICATIONS:  Joseph [name] is a 35 year old patient with left leg compartment syndrome that developed likely as a result of exertional compartment syndrome presents for operative management.  Compartment pressures were elevated and detailed in the consult note.  PROCEDURE IN DETAIL:  The patient was brought to the operating room where general endotracheal anesthesia was induced and preoperative antibiotics were administered.  Left leg was shaved and prepped with Hibiclens and saline and draped in a sterile manner.  Time-out was called.  Lateral incision was made from just below the fibular head to about 8 cm above the lateral malleolus.  Skin and subcutaneous tissues were sharply divided.  The transverse incision was made traversing the intermuscular septum between the anterior and lateral compartments. Lateral compartments released with care being taken to avoid injury to superficial peroneal nerve.  Lateral compartment musculature appeared viable and it did contract with stimulation.  Anterior compartment was released.  This muscle was nonviable.  Significant portion had to be debrided including almost  all the tibialis anterior muscle.  There was a peripheral rim of muscle which did bleed but it was not attached to functional tendons.  Significant portion of the central portion of the anterior compartment did require debridement because of the dead devitalized muscle.  This muscle was sent to Pathology for assessment. At this time, the superficial posterior compartment was released.  This compartment was soft.  No devitalized muscle was present.  Attention was directed medially.  An incision was made approximately 20 cm on the posterior medial aspect of the tibial crest.  Skin and subcutaneous tissues were sharply divided.  Care was taken to avoid injury to saphenous vein and nerve.  The fascia overlying the deep posterior compartment was divided.  This muscle tissue also appeared viable.  This medial incision was irrigated.  Skin was able to be closed using 2-0 Vicryl suture.  Wound VAC was placed in the lateral incision.  The plan is for return to OR on Friday for repeat debridement and delayed primary closure.  He will not have functional anterior compartment musculature in the future.     Burnard Bunting, M.D.     GSD/MEDQ  D:  03/16/2011  T:  03/16/2011  Job:  [id number]  Electronically Signed by Reece Agar.  DEAN M.D. on 04/07/2011 05:38:04 PM

## 2011-04-07 NOTE — Consult Note (Signed)
NAMEJABIER, [name]                ACCOUNT NO.:  [id number]  MEDICAL RECORD NO.:  [id number]  LOCATION:  3302                         FACILITY:  [facility]  PHYSICIAN:  Burnard Bunting, M.D.    DATE OF BIRTH:  04-May-1976  DATE OF CONSULTATION:  03/16/2011 DATE OF DISCHARGE:                                CONSULTATION   REQUESTING PHYSICIAN:  Trudi Ida. Denton Lank, MD  CHIEF COMPLAINT:  Left leg pain.  HISTORY OF PRESENT ILLNESS:  Joseph [name] is a 35 year old patient with left leg pain.  The patient actually developed left leg swelling after cutting the grass on Saturday 3-4 days ago.  Denies any trauma or injury.  He went to the primary care provider's office and no definite diagnosis was made.  Denies any fever and chills.  No prior history of cellulitis.  The patient's pain has become progressively severe and the patient reports decreased foot strength.  ALLERGIES:  He is allergic to HALDOL.  CURRENT MEDICATIONS:  Aleve, clonazepam, Depakote, docusate sodium, fenofibrate, Klonopin, multiple vitamins, pravastatin, Prozac, and Zyprexa.  PAST MEDICAL HISTORY:  Notable for schizophrenia.  FAMILY HISTORY:  There is no history of schizophrenia.  SOCIAL HISTORY:  He works as a Public affairs consultant.  No smoking, no drinking, and no drug abuse.  Lives in a group home.  REVIEW OF SYSTEMS:  All other systems reviewed are negative as they relate to the left leg.  Specifically, there are no right leg symptoms.  PHYSICAL EXAMINATION:  GENERAL:  He is well developed, well nourished, not in acute distress, alert, and oriented. VITAL SIGNS:  Heart rate 98, blood pressure 121/81. CHEST:  Clear to auscultation. HEART:  Beats are regular rate and rhythm. ABDOMEN:  Benign. EXTREMITIES:  Left leg demonstrates swelling in the calf.  He has no active dorsiflexion or eversion on the left.  He does have active dorsiflexion and eversion on the right.  On the left hand side, he does have active plantar flexion.   He does not report any decreased sensation on the dorsal plantar aspect of the foot on the left-hand side.  Does have swelling.  His anterior and lateral compartments are tight to palpation of the left-hand side, not on the right.  LABORATORY VALUES:  White count 14.5 and hematocrit 35.8.  Sodium and potassium 130 and 3.9, BUN and creatinine 12 and 0.84.  CK total is 10,781.  MRI scan shows edema in the anterior and lateral compartment. Compartment pressures are measured.  Anterior compartment is 80, lateral compartment is 45, and superficial posterior compartment is 15.  IMPRESSION:  Compartment syndrome.  PLAN:  Emergent fasciotomies, nerve is not working, foot is perfused, difficult to palpate a dorsalis pedis pulse because of the swelling in the foot.  This is a potential limb threatening injury.  Medical decision-making is complicated by decision for surgery and the severe nature of the problem.  The patient understands the risks and benefits of surgery.  All questions answered.  His nerve damage could be permanent.     Burnard Bunting, M.D.     GSD/MEDQ  D:  03/16/2011  T:  03/16/2011  Job:  [id number]  Electronically Signed by Reece Agar.  Hoyt Leanos M.D. on 04/07/2011 05:38:01 PM

## 2011-04-13 ENCOUNTER — Ambulatory Visit: Payer: Medicare Other | Admitting: *Deleted

## 2011-04-26 ENCOUNTER — Ambulatory Visit: Payer: Medicare Other | Attending: Orthopedic Surgery | Admitting: Physical Therapy

## 2011-04-26 DIAGNOSIS — R262 Difficulty in walking, not elsewhere classified: Secondary | ICD-10-CM | POA: Insufficient documentation

## 2011-04-26 DIAGNOSIS — M25676 Stiffness of unspecified foot, not elsewhere classified: Secondary | ICD-10-CM | POA: Insufficient documentation

## 2011-04-26 DIAGNOSIS — M25673 Stiffness of unspecified ankle, not elsewhere classified: Secondary | ICD-10-CM | POA: Insufficient documentation

## 2011-04-26 DIAGNOSIS — R5381 Other malaise: Secondary | ICD-10-CM | POA: Insufficient documentation

## 2011-04-26 DIAGNOSIS — IMO0001 Reserved for inherently not codable concepts without codable children: Secondary | ICD-10-CM | POA: Insufficient documentation

## 2011-04-26 DIAGNOSIS — M25579 Pain in unspecified ankle and joints of unspecified foot: Secondary | ICD-10-CM | POA: Insufficient documentation

## 2011-05-02 ENCOUNTER — Ambulatory Visit: Payer: Medicare Other | Admitting: *Deleted

## 2011-05-08 ENCOUNTER — Ambulatory Visit: Payer: Medicare Other | Admitting: Rehabilitative and Restorative Service Providers"

## 2011-05-09 ENCOUNTER — Ambulatory Visit: Payer: Medicare Other | Admitting: Physical Therapy

## 2011-05-11 ENCOUNTER — Ambulatory Visit: Payer: Medicare Other | Admitting: Physical Therapy

## 2011-05-16 ENCOUNTER — Ambulatory Visit: Payer: Medicare Other | Admitting: Physical Therapy

## 2011-05-18 ENCOUNTER — Encounter: Payer: Medicare Other | Admitting: Physical Therapy

## 2011-05-23 ENCOUNTER — Encounter: Payer: Medicare Other | Admitting: Physical Therapy

## 2011-06-05 ENCOUNTER — Encounter: Payer: Medicare Other | Admitting: Physical Therapy

## 2011-06-13 ENCOUNTER — Encounter: Payer: Self-pay | Admitting: Internal Medicine

## 2011-06-20 ENCOUNTER — Ambulatory Visit (INDEPENDENT_AMBULATORY_CARE_PROVIDER_SITE_OTHER): Payer: Medicare Other | Admitting: Internal Medicine

## 2011-06-20 ENCOUNTER — Encounter: Payer: Self-pay | Admitting: Internal Medicine

## 2011-06-20 VITALS — BP 112/70 | HR 84 | Ht 73.0 in | Wt 309.4 lb

## 2011-06-20 DIAGNOSIS — F411 Generalized anxiety disorder: Secondary | ICD-10-CM

## 2011-06-20 DIAGNOSIS — R1310 Dysphagia, unspecified: Secondary | ICD-10-CM

## 2011-06-20 NOTE — Progress Notes (Signed)
HISTORY OF PRESENT ILLNESS:  Joseph [name] is a 35 y.o. male with morbid obesity, hyperlipidemia, anxiety, and schizophrenia for which she is on multiple psychotropic drugs. He presents today regarding swallowing issues. He is accompanied by his father. He reports, for the first time, about 2 weeks ago dosing irritation in the posterior pharyngeal region with meals. He describes a "loose skin sensation". He said that, approximately 7 times, he has had coughing spells after meals. Generally solid food. No classic reflux symptoms or esophageal dysphagia. No history of aspiration pneumonia. No odynophagia. His GI review of systems is otherwise negative. No prior history of GI issues. Most recently, dealing with compartment syndrome of the left lower extremity, for which he underwent surgery. He is recovering well.  REVIEW OF SYSTEMS:  All non-GI ROS negative except for anxiety  Past Medical History  Diagnosis Date  . Anemia     post operative  . Hyperlipidemia   . Schizophrenia   . Anxiety   . Obesity   . Cellulitis     bilateral thighs    Past Surgical History  Procedure Date  . I&d extremity     left leg  . Tonsillectomy   . Wisdom tooth extraction     Social History [name]  reports that he has never smoked. He has never used smokeless tobacco. He reports that he does not drink alcohol or use illicit drugs.  family history includes Diverticulosis in his father and GER disease in his father.  There is no history of Colon cancer.  Allergies  Allergen Reactions  . Haldol (Haloperidol Decanoate)        PHYSICAL EXAMINATION: Vital signs: BP 112/70  Pulse 84  Ht 6\' 1"  (1.854 m)  Wt 309 lb 6.4 oz (140.343 kg)  BMI 40.82 kg/m2  Constitutional: Obese, generally well-appearing, no acute distress Psychiatric: alert and oriented x3, cooperative Eyes: extraocular movements intact, anicteric, conjunctiva pink Mouth: oral pharynx moist, no lesions Neck: supple no  lymphadenopathy Cardiovascular: heart regular rate and rhythm, no murmur Lungs: clear to auscultation bilaterally Abdomen: soft, obese, nontender, nondistended, no obvious ascites, no peritoneal signs, normal bowel sounds, no organomegaly Extremities: no lower extremity edema bilaterally. Left leg brace Skin: no lesions on visible extremities Neuro: No focal deficits.  ASSESSMENT:  #1. Vague pharyngeal discomfort with meals for 2 weeks duration. Associated coughing. Rule out anatomic abnormality. Rule out oropharyngeal dysphagia #2. Morbid obesity #3. Schizophrenia and anxiety on multiple psychotropic meds  PLAN:  #1. Upper endoscopy with possible esophageal dilation.The nature of the procedure, as well as the risks, benefits, and alternatives were carefully and thoroughly reviewed with the patient. Ample time for discussion and questions allowed. The patient understood, was satisfied, and agreed to proceed.  #2. Given his body habitus and psychiatric issues in combination with his medications, he would be best served with propofol sedation and CRNA supervision. I have discussed this with the patient and his father.

## 2011-06-20 NOTE — Patient Instructions (Signed)
EGD [facility] 07/05/11 3:00 pm arrive 2:00 pm  Endoscopy brochure given for you to read

## 2011-07-03 ENCOUNTER — Telehealth: Payer: Self-pay | Admitting: Internal Medicine

## 2011-07-05 ENCOUNTER — Encounter: Payer: Medicare Other | Admitting: Internal Medicine

## 2011-07-05 ENCOUNTER — Telehealth: Payer: Self-pay | Admitting: Internal Medicine

## 2011-07-05 NOTE — Telephone Encounter (Signed)
Excellent. Thanks!

## 2011-07-05 NOTE — Telephone Encounter (Signed)
Called to let Dr. Marina Goodell know that his son has not had difficulty swallowing or choking for a week so they have cancelled the appt for the endoscopy.

## 2011-07-09 ENCOUNTER — Inpatient Hospital Stay (HOSPITAL_COMMUNITY)
Admission: EM | Admit: 2011-07-09 | Discharge: 2011-07-15 | DRG: 501 | Disposition: A | Discharge status: Home / Self Care | Payer: Medicare Other | Attending: Orthopedic Surgery | Admitting: Orthopedic Surgery

## 2011-07-09 ENCOUNTER — Emergency Department (HOSPITAL_COMMUNITY): Payer: Medicare Other

## 2011-07-09 DIAGNOSIS — R339 Retention of urine, unspecified: Secondary | ICD-10-CM | POA: Diagnosis present

## 2011-07-09 DIAGNOSIS — I10 Essential (primary) hypertension: Secondary | ICD-10-CM | POA: Diagnosis present

## 2011-07-09 DIAGNOSIS — C96A Histiocytic sarcoma: Secondary | ICD-10-CM | POA: Diagnosis present

## 2011-07-09 DIAGNOSIS — E785 Hyperlipidemia, unspecified: Secondary | ICD-10-CM | POA: Diagnosis present

## 2011-07-09 DIAGNOSIS — F209 Schizophrenia, unspecified: Secondary | ICD-10-CM | POA: Diagnosis present

## 2011-07-09 DIAGNOSIS — M79A29 Nontraumatic compartment syndrome of unspecified lower extremity: Principal | ICD-10-CM | POA: Diagnosis present

## 2011-07-09 LAB — DIFFERENTIAL
Basophils Absolute: 0 10*3/uL (ref 0.0–0.1)
Basophils Relative: 0 % (ref 0–1)
Eosinophils Absolute: 0.1 10*3/uL (ref 0.0–0.7)
Eosinophils Relative: 1 % (ref 0–5)
Lymphocytes Relative: 13 % (ref 12–46)
Lymphs Abs: 1.5 10*3/uL (ref 0.7–4.0)
Monocytes Absolute: 0.9 10*3/uL (ref 0.1–1.0)
Monocytes Relative: 8 % (ref 3–12)
Neutro Abs: 9.5 10*3/uL — ABNORMAL HIGH (ref 1.7–7.7)
Neutrophils Relative %: 79 % — ABNORMAL HIGH (ref 43–77)

## 2011-07-09 LAB — COMPREHENSIVE METABOLIC PANEL
ALT: 43 U/L (ref 0–53)
AST: 37 U/L (ref 0–37)
Albumin: 3.9 g/dL (ref 3.5–5.2)
Alkaline Phosphatase: 56 U/L (ref 39–117)
BUN: 15 mg/dL (ref 6–23)
CO2: 26 mEq/L (ref 19–32)
Calcium: 8.3 mg/dL — ABNORMAL LOW (ref 8.4–10.5)
Chloride: 97 mEq/L (ref 96–112)
Creatinine, Ser: 0.99 mg/dL (ref 0.50–1.35)
GFR calc Af Amer: >90 mL/min (ref >90)
GFR calc non Af Amer: >90 mL/min (ref >90)
Glucose, Bld: 127 mg/dL — ABNORMAL HIGH (ref 70–99)
Potassium: 4 mEq/L (ref 3.5–5.1)
Sodium: 130 mEq/L — ABNORMAL LOW (ref 135–145)
Total Bilirubin: 0.2 mg/dL — ABNORMAL LOW (ref 0.3–1.2)
Total Protein: 6.6 g/dL (ref 6.0–8.3)

## 2011-07-09 LAB — CBC
HCT: 36.5 % — ABNORMAL LOW (ref 39.0–52.0)
Hemoglobin: 12.5 g/dL — ABNORMAL LOW (ref 13.0–17.0)
MCH: 30.4 pg (ref 26.0–34.0)
MCHC: 34.2 g/dL (ref 30.0–36.0)
MCV: 88.8 fL (ref 78.0–100.0)
Platelets: 235 10*3/uL (ref 150–400)
RBC: 4.11 MIL/uL — ABNORMAL LOW (ref 4.22–5.81)
RDW: 13.2 % (ref 11.5–15.5)
WBC: 12.1 10*3/uL — ABNORMAL HIGH (ref 4.0–10.5)

## 2011-07-09 LAB — CK: Total CK: 677 U/L — ABNORMAL HIGH (ref 7–232)

## 2011-07-09 MED ORDER — IOHEXOL 300 MG/ML  SOLN
100.0000 mL | Freq: Once | INTRAMUSCULAR | Status: AC | PRN
  Administered 2011-07-09: 100 mL via INTRAVENOUS

## 2011-07-10 ENCOUNTER — Other Ambulatory Visit: Payer: Self-pay | Admitting: Orthopedic Surgery

## 2011-07-10 ENCOUNTER — Ambulatory Visit: Payer: Medicare Other | Admitting: *Deleted

## 2011-07-10 LAB — BASIC METABOLIC PANEL
BUN: 14 mg/dL (ref 6–23)
CO2: 25 mEq/L (ref 19–32)
Calcium: 8 mg/dL — ABNORMAL LOW (ref 8.4–10.5)
Chloride: 102 mEq/L (ref 96–112)
Creatinine, Ser: 1.1 mg/dL (ref 0.50–1.35)
GFR calc Af Amer: >90 mL/min (ref >90)
GFR calc non Af Amer: 84 mL/min — ABNORMAL LOW (ref >90)
Glucose, Bld: 158 mg/dL — ABNORMAL HIGH (ref 70–99)
Potassium: 4.5 mEq/L (ref 3.5–5.1)
Sodium: 134 mEq/L — ABNORMAL LOW (ref 135–145)

## 2011-07-10 LAB — CBC
HCT: 30.3 % — ABNORMAL LOW (ref 39.0–52.0)
Hemoglobin: 10.2 g/dL — ABNORMAL LOW (ref 13.0–17.0)
MCH: 30.1 pg (ref 26.0–34.0)
MCHC: 33.7 g/dL (ref 30.0–36.0)
MCV: 89.4 fL (ref 78.0–100.0)
Platelets: 255 10*3/uL (ref 150–400)
RBC: 3.39 MIL/uL — ABNORMAL LOW (ref 4.22–5.81)
RDW: 13.6 % (ref 11.5–15.5)
WBC: 10.9 10*3/uL — ABNORMAL HIGH (ref 4.0–10.5)

## 2011-07-10 LAB — GLUCOSE, CAPILLARY
Glucose-Capillary: 129 mg/dL — ABNORMAL HIGH (ref 70–99)
Glucose-Capillary: 137 mg/dL — ABNORMAL HIGH (ref 70–99)
Glucose-Capillary: 153 mg/dL — ABNORMAL HIGH (ref 70–99)
Glucose-Capillary: 131 mg/dL — ABNORMAL HIGH (ref 70–99)
Glucose-Capillary: 148 mg/dL — ABNORMAL HIGH (ref 70–99)
Glucose-Capillary: 194 mg/dL — ABNORMAL HIGH (ref 70–99)

## 2011-07-11 LAB — CBC
HCT: 23.2 % — ABNORMAL LOW (ref 39.0–52.0)
Hemoglobin: 7.7 g/dL — ABNORMAL LOW (ref 13.0–17.0)
MCH: 30.1 pg (ref 26.0–34.0)
MCHC: 33.6 g/dL (ref 30.0–36.0)
MCV: 89.6 fL (ref 78.0–100.0)
Platelets: 173 10*3/uL (ref 150–400)
RBC: 2.59 MIL/uL — ABNORMAL LOW (ref 4.22–5.81)
RDW: 13.7 % (ref 11.5–15.5)
WBC: 6.7 10*3/uL (ref 4.0–10.5)

## 2011-07-11 LAB — GLUCOSE, CAPILLARY
Glucose-Capillary: 115 mg/dL — ABNORMAL HIGH (ref 70–99)
Glucose-Capillary: 114 mg/dL — ABNORMAL HIGH (ref 70–99)
Glucose-Capillary: 100 mg/dL — ABNORMAL HIGH (ref 70–99)
Glucose-Capillary: 118 mg/dL — ABNORMAL HIGH (ref 70–99)

## 2011-07-12 ENCOUNTER — Telehealth: Payer: Self-pay | Admitting: *Deleted

## 2011-07-12 LAB — CROSSMATCH
ABO/RH(D): O NEG
Antibody Screen: NEGATIVE
Unit division: 0
Unit division: 0

## 2011-07-12 LAB — CBC
HCT: 25.1 % — ABNORMAL LOW (ref 39.0–52.0)
Hemoglobin: 8.4 g/dL — ABNORMAL LOW (ref 13.0–17.0)
MCH: 30.7 pg (ref 26.0–34.0)
MCHC: 33.5 g/dL (ref 30.0–36.0)
MCV: 91.6 fL (ref 78.0–100.0)
Platelets: 159 10*3/uL (ref 150–400)
RBC: 2.74 MIL/uL — ABNORMAL LOW (ref 4.22–5.81)
RDW: 13.7 % (ref 11.5–15.5)
WBC: 7.6 10*3/uL (ref 4.0–10.5)

## 2011-07-12 LAB — GLUCOSE, CAPILLARY
Glucose-Capillary: 114 mg/dL — ABNORMAL HIGH (ref 70–99)
Glucose-Capillary: 101 mg/dL — ABNORMAL HIGH (ref 70–99)
Glucose-Capillary: 165 mg/dL — ABNORMAL HIGH (ref 70–99)
Glucose-Capillary: 108 mg/dL — ABNORMAL HIGH (ref 70–99)

## 2011-07-12 LAB — BASIC METABOLIC PANEL
BUN: 12 mg/dL (ref 6–23)
CO2: 30 mEq/L (ref 19–32)
Calcium: 8.3 mg/dL — ABNORMAL LOW (ref 8.4–10.5)
Chloride: 104 mEq/L (ref 96–112)
Creatinine, Ser: 1.12 mg/dL (ref 0.50–1.35)
GFR calc Af Amer: >90 mL/min (ref >90)
GFR calc non Af Amer: 82 mL/min — ABNORMAL LOW (ref >90)
Glucose, Bld: 118 mg/dL — ABNORMAL HIGH (ref 70–99)
Potassium: 4.4 mEq/L (ref 3.5–5.1)
Sodium: 139 mEq/L (ref 135–145)

## 2011-07-12 NOTE — Op Note (Signed)
Joseph [name], Joseph [name]                ACCOUNT NO.:  [id number]  MEDICAL RECORD NO.:  [id number]  LOCATION:  5007                         FACILITY:  [facility]  PHYSICIAN:  Nadara Mustard, MD     DATE OF BIRTH:  1975/10/27  DATE OF PROCEDURE:  07/11/2011 DATE OF DISCHARGE:                              OPERATIVE REPORT   PREOPERATIVE DIAGNOSIS:  Status post fasciotomies for compartment syndrome with necrotic anterior and lateral compartments with debridement in the anterior and lateral compartments and placement of a wound VAC.  POSTOP DIAGNOSIS:  Status post fasciotomies for compartment syndrome with necrotic anterior and lateral compartments with debridement in the anterior and lateral compartments and placement of a wound VAC.  PROCEDURES: 1. Further excisional debridement of the anterior and lateral     compartments of the muscle. 2. Irrigation, debridement, and cleansing with pulsatile lavage. 3. Complex wound closure.  SURGEON:  Nadara Mustard, MD  ANESTHESIA:  General.  ESTIMATED BLOOD LOSS:  Minimal.  ANTIBIOTICS:  Vancomycin and Zosyn preoperatively.  DRAINS:  None.  COMPLICATIONS:  None.  TOURNIQUET TIME:  None.  CULTURES OBTAINED:  X1.  DISPOSITION:  To PACU in stable condition.  INDICATION FOR PROCEDURE:  The patient is a 35 year old gentleman who was initially seen late Sunday evening, early Monday morning with a compartment syndrome.  His anterior and lateral compartment pressures were over 120.  He was brought emergently to the operating room for decompression of the compartments.  At this time, he was found to have necrotic muscles in both the anterior and lateral compartments.  These were debrided.  A wound VAC was placed, and he returns today for further irrigation, debridement, and wound closure.  Risks and benefits were discussed with the patient and his parents including infection, neurovascular injury, nonhealing of the wound, need for  additional surgery.  The patient's parents state they understand and wished to proceed at this time.  DESCRIPTION OF PROCEDURE:  The patient was brought to OR #1 and underwent a general anesthetic.  After adequate level of anesthesia was obtained, the patient's right lower extremity was prepped using Betadine paint, Betadine scrub, and draped into a sterile field.  The wound VAC was removed prior to scrubbing.  The wound was irrigated with pulsatile lavage.  There was further necrotic tissue within the compartment.  This was debrided back to viable healthy muscle.  The wound was irrigated with pulsatile lavage.  All fascial planes were clean.  There was no purulence, no signs of any deep abscess.  Deep culture was obtained. The wound was then closed.  The wound extended essentially from the knee to the ankle over the anterior and lateral compartments.  This was closed using 2-0 nylon with a far-near-near-far suture technique as well as interspersed staples.  The wound closed well without tension on the skin.  The wound was covered with Adaptic, orthopedic sponges, AB dressing, Kerlix, and Coban.  The patient was extubated and taken to the PACU in stable condition.  Continue IV antibiotics.  Anticipate discharge home with possibly some p.o. antibiotics.     Nadara Mustard, MD     MVD/MEDQ  D:  07/11/2011  T:  07/12/2011  Job:  [id number]  Electronically Signed by [name] MD on 07/12/2011 06:30:05 AM

## 2011-07-12 NOTE — Op Note (Signed)
Joseph [name], Joseph [name]                ACCOUNT NO.:  [id number]  MEDICAL RECORD NO.:  [id number]  LOCATION:  5007                         FACILITY:  [facility]  PHYSICIAN:  Nadara Mustard, MD     DATE OF BIRTH:  Mar 03, 1976  DATE OF PROCEDURE:  07/10/2011 DATE OF DISCHARGE:                              OPERATIVE REPORT   PREOPERATIVE DIAGNOSIS:  Compartment syndrome, anterior and lateral compartments, right leg.  POSTOPERATIVE DIAGNOSES: 1. Compartment syndrome, anterior and lateral compartments, right leg. 2. Necrotic muscle, anterior and lateral compartments, right leg.  SURGICAL PROCEDURE: 1. Fasciotomy, anterior and lateral compartments, right leg. 2. Excision of the muscles of the entire anterior and lateral     compartments, right leg. 3. Placement of a wound VAC at -75 mmHg.  SURGEON:  Nadara Mustard, MD.  ANESTHESIA:  General.  ESTIMATED BLOOD LOSS:  Minimal.  ANTIBIOTICS:  Kefzol 2 grams.  DRAINS:  None.  COMPLICATIONS:  None.  TOURNIQUET TIME:  None.  DISPOSITION:  To PACU in stable condition.  INDICATIONS FOR PROCEDURE:  The patient is a 35 year old gentleman whose mother called me approximately a quarter to 5 stating that the patient felt like he had a compartment syndrome in the right leg which was somewhat to the compartment syndrome that he had in his left leg.  She told me that the EMS was there and they were bringing him to [facility] ER.  I was called approximately 6 hours later just before 11:00 p.m. stating that the patient had a potential compartment syndrome and that they had obtained a CT scan of the leg.  ER physician Dr. Dione Booze states that he did not personally examine the patient and that the PA was evaluating and treating the patient.  The patient's family stated they told the ER staff 2 times that they wanted me to be called and I was not called at their request.  PAST MEDICAL HISTORY:  Significant for diabetes, hypertension,  high cholesterol, and schizophrenia.  FAMILY HISTORY:  Negative.  SOCIAL HISTORY:  Negative for tobacco.  ALLERGIES:  HALDOL.  MEDICATIONS:  Clonazepam, Colace, Depakote, fenofibrate, Klonopin, pravastatin.  REVIEW OF SYSTEMS:  Positive for sleep apnea.  The patient uses a CPAP machine.  PHYSICAL EXAMINATION:  Blood pressure 139/73, pulse 74, respiratory rate 20, temperature 98.7.  White blood cell count 12100.  In general, he is in mild distress.  Review of general, HEENT, neck, chest, breasts, heart, abdomen, and pelvis GU within normal limits.  Examination of both lower extremities, he does have palpable pulses bilaterally.  He does have a significant swelling in the right lower extremity with a hard tense distended anterior and lateral compartments.  There are palpable pulses.  There is no tenderness to palpation medially.  CT scan is equivocal.  Compartment pressures were measured at bedside and showed the posterior and deep compartment pressures of approximately 15 mmHg. The anterior and lateral compartment pressures were 120.  ASSESSMENT:  Compartment syndrome, right leg.  PLAN:  Discussed with the patient and his parents the need for emergent surgical intervention.  Discussed the risks including infection, neurovascular injury, dead muscle, need for additional surgery.  The patient  and family state they understand and wished to proceed at this time.  DESCRIPTION OF PROCEDURE:  The patient was brought to OR room #5 and underwent a general anesthetic.  After adequate level of anesthesia obtained, patient's right lower extremity was prepped using DuraPrep and draped into the sterile field.  An incision was made longitudinally over the length of the anterior and lateral compartments.  The compartments were distended.  There was a significant amount of subcutaneous fluid. The anterior and lateral compartments were both released.  Care was taken to protect the superficial  peroneal nerve.  Examination of the muscles showed a foul odor.  The muscles had no contractility.  They had a soft consistency.  There was no contractility. Electrocautery provided no muscle contracture.  The muscles were easily debrided from the anterior and lateral compartment with blunt dissection.  The anterior and lateral compartments of the muscles had essentially melted.  After continued debridement, there was more foul odor but there was no deep purulence.  Hemostasis was obtained.  There was 1 small bleeder coming off the anterior tibial artery and this was closed with 6-0 Prolene. The wound was cleansed.  A wound VAC was placed deep within the wound. This was covered with [place].  This was set to -75 mm of suction.  There was a good suction fit.  The patient was extubated, taken to PACU in stable condition.  Plan for return to the operating room either Tuesday or Wednesday.  This was also discussed with his parents.     Nadara Mustard, MD     MVD/MEDQ  D:  07/10/2011  T:  07/10/2011  Job:  [id number]  Electronically Signed by [name] MD on 07/12/2011 06:30:00 AM

## 2011-07-13 DIAGNOSIS — T798XXA Other early complications of trauma, initial encounter: Secondary | ICD-10-CM

## 2011-07-13 LAB — URINE MICROSCOPIC-ADD ON

## 2011-07-13 LAB — RETICULOCYTES
RBC.: 2.73 MIL/uL — ABNORMAL LOW (ref 4.22–5.81)
Retic Count, Absolute: 87.4 10*3/uL (ref 19.0–186.0)
Retic Ct Pct: 3.2 % — ABNORMAL HIGH (ref 0.4–3.1)

## 2011-07-13 LAB — IRON AND TIBC
Iron: 10 ug/dL — ABNORMAL LOW (ref 42–135)
Saturation Ratios: 3 % — ABNORMAL LOW (ref 20–55)
TIBC: 291 ug/dL (ref 215–435)
UIBC: 281 ug/dL (ref 125–400)

## 2011-07-13 LAB — CBC
HCT: 23.6 % — ABNORMAL LOW (ref 39.0–52.0)
Hemoglobin: 7.9 g/dL — ABNORMAL LOW (ref 13.0–17.0)
MCH: 30.6 pg (ref 26.0–34.0)
MCHC: 33.5 g/dL (ref 30.0–36.0)
MCV: 91.5 fL (ref 78.0–100.0)
Platelets: 157 10*3/uL (ref 150–400)
RBC: 2.58 MIL/uL — ABNORMAL LOW (ref 4.22–5.81)
RDW: 14 % (ref 11.5–15.5)
WBC: 6.1 10*3/uL (ref 4.0–10.5)

## 2011-07-13 LAB — BASIC METABOLIC PANEL
BUN: 10 mg/dL (ref 6–23)
CO2: 29 mEq/L (ref 19–32)
Calcium: 8.6 mg/dL (ref 8.4–10.5)
Chloride: 106 mEq/L (ref 96–112)
Creatinine, Ser: 1.04 mg/dL (ref 0.50–1.35)
GFR calc Af Amer: >90 mL/min (ref >90)
GFR calc non Af Amer: >90 mL/min (ref >90)
Glucose, Bld: 102 mg/dL — ABNORMAL HIGH (ref 70–99)
Potassium: 3.8 mEq/L (ref 3.5–5.1)
Sodium: 142 mEq/L (ref 135–145)

## 2011-07-13 LAB — URINALYSIS, ROUTINE W REFLEX MICROSCOPIC
Bilirubin Urine: NEGATIVE
Glucose, UA: NEGATIVE mg/dL
Ketones, ur: NEGATIVE mg/dL
Nitrite: NEGATIVE
Protein, ur: NEGATIVE mg/dL
Specific Gravity, Urine: 1.019 (ref 1.005–1.030)
Urobilinogen, UA: 0.2 mg/dL (ref 0.0–1.0)
pH: 7 (ref 5.0–8.0)

## 2011-07-13 LAB — VITAMIN B12: Vitamin B-12: 538 pg/mL (ref 211–911)

## 2011-07-13 LAB — GLUCOSE, CAPILLARY
Glucose-Capillary: 98 mg/dL (ref 70–99)
Glucose-Capillary: 133 mg/dL — ABNORMAL HIGH (ref 70–99)

## 2011-07-13 LAB — FERRITIN: Ferritin: 29 ng/mL (ref 22–322)

## 2011-07-13 LAB — FOLATE: Folate: 18.6 ng/mL

## 2011-07-13 NOTE — Telephone Encounter (Signed)
ERROR

## 2011-07-14 LAB — GLUCOSE, CAPILLARY
Glucose-Capillary: 132 mg/dL — ABNORMAL HIGH (ref 70–99)
Glucose-Capillary: 122 mg/dL — ABNORMAL HIGH (ref 70–99)
Glucose-Capillary: 115 mg/dL — ABNORMAL HIGH (ref 70–99)
Glucose-Capillary: 117 mg/dL — ABNORMAL HIGH (ref 70–99)
Glucose-Capillary: 119 mg/dL — ABNORMAL HIGH (ref 70–99)
Glucose-Capillary: 109 mg/dL — ABNORMAL HIGH (ref 70–99)

## 2011-07-14 LAB — HOMOCYSTEINE: Homocysteine: 8 umol/L (ref 4.0–15.4)

## 2011-07-14 LAB — WOUND CULTURE: Culture: NO GROWTH

## 2011-07-15 LAB — GLUCOSE, CAPILLARY
Glucose-Capillary: 95 mg/dL (ref 70–99)
Glucose-Capillary: 124 mg/dL — ABNORMAL HIGH (ref 70–99)

## 2011-07-17 LAB — LUPUS ANTICOAGULANT PANEL
DRVVT: 43.8 secs — ABNORMAL HIGH (ref 34.1–42.2)
Lupus Anticoagulant: DETECTED — AB
PTT Lupus Anticoagulant: 49.5 secs — ABNORMAL HIGH (ref 28.0–43.0)
PTTLA 4:1 Mix: 49.1 secs — ABNORMAL HIGH (ref 28.0–43.0)
PTTLA Confirmation: 11.8 secs — ABNORMAL HIGH (ref <8.0)
dRVVT Incubated 1:1 Mix: 37.8 secs (ref 34.1–42.2)

## 2011-07-17 LAB — CARDIOLIPIN ANTIBODIES, IGG, IGM, IGA
Anticardiolipin IgA: 4 U/mL — ABNORMAL LOW
Anticardiolipin IgG: 2 GPL U/mL — ABNORMAL LOW
Anticardiolipin IgM: 1 [MPL'U]/mL — ABNORMAL LOW

## 2011-07-17 LAB — ANTITHROMBIN III: AntiThromb III Func: 91 % (ref 76–126)

## 2011-07-17 LAB — PROTEIN S ACTIVITY: Protein S Activity: 76 % (ref 69–129)

## 2011-07-17 LAB — PROTEIN C ACTIVITY: Protein C Activity: 116 % (ref 75–133)

## 2011-07-17 LAB — BETA-2-GLYCOPROTEIN I ABS, IGG/M/A
Beta-2 Glyco I IgG: 0 G Units (ref <20)
Beta-2-Glycoprotein I IgA: 4 A Units (ref <20)
Beta-2-Glycoprotein I IgM: 1 M Units (ref <20)

## 2011-07-17 LAB — FACTOR 7 ASSAY: Factor VII Activity: 123 % (ref 60–150)

## 2011-07-17 LAB — FACTOR 8 ASSAY: Coagulation Factor VIII: 82 % (ref 73–140)

## 2011-07-17 LAB — FACTOR 5 LEIDEN

## 2011-07-17 LAB — FACTOR 9 ASSAY: Coagulation Factor IX: 173 % — ABNORMAL HIGH (ref 75–134)

## 2011-07-17 NOTE — Discharge Summary (Signed)
  NAMEGLENNIE, RODDA                ACCOUNT NO.:  [id number]  MEDICAL RECORD NO.:  [id number]  LOCATION:  5007                         FACILITY:  [facility]  PHYSICIAN:  Nadara Mustard, MD     DATE OF BIRTH:  Jul 26, 1976  DATE OF ADMISSION:  07/09/2011 DATE OF DISCHARGE:  07/15/2011                              DISCHARGE SUMMARY   DISCHARGE DIAGNOSIS:  Compartment syndrome of the anterior and lateral compartments of right leg.  SURGICAL PROCEDURES: 1. Fasciotomy anterior and lateral compartments right leg. 2. Excision of necrotic muscle of the entire anterior and lateral     compartments 3. Placement of a wound VAC.  FOLLOWUP SURGERIES:  Removal of the wound VAC, further irrigation and debridement of skin, soft tissue, and necrotic muscle, and closure of the wound.  DISCHARGE MEDICATIONS:  Include the admission medications plus a prescription for Vicodin for pain.  The patient underwent physical therapy during his hospital stay and wasindependent with ambulation with Physical Therapy.  ANTIBIOTIC TREATMENT:  The patient underwent treatment with both vancomycin and Zosyn during his hospital stay.  CONSULTATION:  The patient received a Hemonc consultation for evaluation for a possible hypercoagulable state.  The patient's hospital course was essentially unremarkable.  He had no complications during his hospital stay.  The patient did have extended stay in the emergency room prior to consultation by Orthopedics.  The patient was discharged to home in stable condition with follow up in the office this next week.  The dressing will stay intact.  He will wear a fracture boot for a foot drop on the right.  The patient's family is instructed to call should there be any abnormalities prior to follow up. Prescription for Vicodin for pain provided.  Discharged to home in stable condition.     Nadara Mustard, MD     MVD/MEDQ  D:  07/14/2011  T:  07/15/2011  Job:   [id number]  Electronically Signed by [name] MD on 07/17/2011 05:57:34 PM

## 2011-07-18 LAB — PROTEIN S, TOTAL: Protein S Ag, Total: 167 % — ABNORMAL HIGH (ref 60–150)

## 2011-07-18 LAB — PROTEIN C, TOTAL: Protein C, Total: 99 % (ref 72–160)

## 2011-07-20 LAB — PROTHROMBIN GENE MUTATION

## 2011-07-26 NOTE — Consult Note (Signed)
Joseph [name], Joseph [name]                ACCOUNT NO.:  [id number]  MEDICAL RECORD NO.:  [id number]  LOCATION:  5007                         FACILITY:  [facility]  PHYSICIAN:  Drue Second, M.D.     DATE OF BIRTH:  03-Dec-1975  DATE OF CONSULTATION:  07/13/2011 DATE OF DISCHARGE:                                CONSULTATION   CONSULTING PHYSICIAN:  Dr. Welton Flakes.  REQUESTING PHYSICIAN:  Dr. Lajoyce Corners.  REASON FOR CONSULT:  Compartment syndrome.  HISTORY OF PRESENT ILLNESS:  Joseph [name] is a 35 year old white male with history of cellulitis and myositis of the left lower extremity back in June 2012, with subsequent compartment syndrome requiring debridements. He also required at that time IV antibiotics including Zosyn and clindamycin.  At that time, he was also noticed to have anemia, with a hemoglobin of 7.4, felt to be secondary to acute blood loss requiring transfusion of 2 units of packed RBCs as well as p.o. iron.  He was discharged on March 25, 2011, to return on July 09, 2011, this time with right lower extremity compartment syndrome requiring debridement on July 10, 2011, and revision on July 12, 2011.  His H and H on admission was 12.5 and 36.5 respectively, dropping to 7.7 and 23 today. He received transfusion of 2 units of packed RBCs on July 09, 2011, with a negative Hemoccult.  We were asked to see the patient in consultation, regarding the diagnosis of compartment syndrome, as well as the anemia involved, in the setting of acute blood loss.  PAST MEDICAL HISTORY: 1. Left lower extremity cellulitis and myositis and compartment     syndrome in June 2012, with recurrence in the opposite extremity in     October 2012. 2. History of acute blood loss anemia. 3. Hyperlipidemia. 4. Schizophrenia.  SURGERIES:  Status post multiple excisional debridements in the lower extremities secondary to compartment syndrome.  ALLERGIES:  HALDOL.  MEDICATIONS:  Klonopin, Depakote, Colace,  Ensure, fenofibrate, Prozac, multivitamin, Zyprexa, Zosyn, Pravachol, vancomycin, Tylenol, AlternaGEL, Dulcolax, Benadryl, Norco, Maalox, Cepacol, Robaxin, Reglan, Zofran, Percocet, temazepam.  REVIEW OF SYSTEMS:  At this time, the patient is denying any fever, chills, night sweats, or shortness of breath at rest, but on exertion, he becomes short of breath, with increasing fatigue, and of note, he states that 10 minutes prior to the events of compartment syndrome, he experiences excruciating pain preceding this.  There are no other symptoms associated with that.  He denies any GI or GU symptoms.  No cardiac complaints.  Rest of the review of systems is essentially negative.  FAMILY HISTORY:  Mother and father are both alive, with no history of clotting disorders.  Mostly, in the family there is strong history of CAD.  SOCIAL HISTORY:  The patient is single.  No children.  Full code.  He is dish washer.  He lives at a group home.  Of note, his father, his friend's with the father of one of our partners, Dr. Truett Perna.  PHYSICAL EXAMINATION:  GENERAL:  [name], somnolent at this time, but able to answer questions appropriately. VITAL SIGNS:  Blood pressure 115/54, pulse 85, respirations 18, temperature 98.2, O2  sats 96% on room air. HEENT:  Normocephalic, atraumatic.  Sclerae anicteric.  Oral cavity without thrush or lesions.  NECK:  Supple.  No cervical or supraclavicular masses. LUNGS:  Clear to auscultation bilaterally.  No axillary masses. CARDIOVASCULAR:  Regular rate and rhythm without murmurs, rubs, or gallops.  ABDOMEN:  Obese, nontender, bowel sounds x4.  No hepatosplenomegaly. GU AND RECTAL:  Deferred. EXTREMITIES:  Both lower extremities had splint/braces in place.  Unable to assess fully, due to the significant wrapping around it.  There are no other areas of bruising or bleeding or petechial rash. NEURO:  Otherwise nonfocal.  LABORATORY  DATA:  Hemoglobin 7.9, hematocrit 23.6, white count 6.1, platelets 127,000.  MCV 91.5, ANC 9.5, lymphocytes 1.5, monocytes 0.9. Iron on June 2012 was 28, TIBC 204, percentage saturation 14, ferritin 190, folic acid 9.7, B12 589.  New iron studies are pending.  Sodium 142, potassium 3.8, BUN 10, creatinine 1.04, glucose 102, total bilirubin 0.2, alkaline phosphatase 56, AST 37, ALT 43, total protein 6.6, albumin 3.9, calcium 8.6.  HIV was negative on June 2012 along with negative hepatitis panel.  Anemia panel is pending.  Wound cultures negative.  Hemoccult negative.  ASSESSMENT/PLAN:  Dr. Welton Flakes has seen and evaluated the patient.  This is a 35 year old male admitted with compartment syndrome on the right lower extremity, who previously had his left leg with compartment syndrome in June 2012.  We were asked to see the patient for possible causes.  The review of the patient's history does not reveal a history of coagulopathy.  He had never had a history of PE or DVTs to indicate a hypercoagulable state.  RECOMMENDATIONS: 1. Check full hypercoagulable panel to rule out hypercoagulability. 2. Check factors 8, 9, and 12. 3. We will follow with you and depending on the results, further     recommendations are to proceed. 4. Suggest having Vascular Service involved.  Thank you very much for allowing [place] the opportunity to participate in the care of Joseph [name].     Drue Second, M.D.     KK/MEDQ  D:  07/14/2011  T:  07/14/2011  Job:  [id number]  Electronically Signed by Drue Second MD on 07/26/2011 08:22:50 AM

## 2011-07-27 ENCOUNTER — Encounter: Payer: Medicare Other | Admitting: Oncology

## 2011-07-31 ENCOUNTER — Encounter (HOSPITAL_COMMUNITY): Payer: Self-pay | Admitting: Emergency Medicine

## 2011-07-31 ENCOUNTER — Emergency Department (HOSPITAL_COMMUNITY)
Admission: EM | Admit: 2011-07-31 | Discharge: 2011-07-31 | Disposition: A | Discharge status: Home / Self Care | Payer: Medicare Other | Attending: Emergency Medicine | Admitting: Emergency Medicine

## 2011-07-31 ENCOUNTER — Telehealth: Payer: Self-pay | Admitting: *Deleted

## 2011-07-31 ENCOUNTER — Other Ambulatory Visit: Payer: Self-pay

## 2011-07-31 ENCOUNTER — Emergency Department (HOSPITAL_COMMUNITY): Payer: Medicare Other

## 2011-07-31 DIAGNOSIS — IMO0001 Reserved for inherently not codable concepts without codable children: Secondary | ICD-10-CM | POA: Insufficient documentation

## 2011-07-31 DIAGNOSIS — E669 Obesity, unspecified: Secondary | ICD-10-CM | POA: Insufficient documentation

## 2011-07-31 DIAGNOSIS — M7989 Other specified soft tissue disorders: Secondary | ICD-10-CM | POA: Insufficient documentation

## 2011-07-31 DIAGNOSIS — R079 Chest pain, unspecified: Secondary | ICD-10-CM | POA: Insufficient documentation

## 2011-07-31 DIAGNOSIS — M791 Myalgia, unspecified site: Secondary | ICD-10-CM

## 2011-07-31 DIAGNOSIS — M79609 Pain in unspecified limb: Secondary | ICD-10-CM | POA: Insufficient documentation

## 2011-07-31 DIAGNOSIS — R209 Unspecified disturbances of skin sensation: Secondary | ICD-10-CM | POA: Insufficient documentation

## 2011-07-31 LAB — COMPREHENSIVE METABOLIC PANEL
ALT: 24 U/L (ref 0–53)
AST: 33 U/L (ref 0–37)
Albumin: 4.2 g/dL (ref 3.5–5.2)
Alkaline Phosphatase: 53 U/L (ref 39–117)
BUN: 12 mg/dL (ref 6–23)
CO2: 22 mEq/L (ref 19–32)
Calcium: 10 mg/dL (ref 8.4–10.5)
Chloride: 103 mEq/L (ref 96–112)
Creatinine, Ser: 0.98 mg/dL (ref 0.50–1.35)
GFR calc Af Amer: >90 mL/min (ref >90)
GFR calc non Af Amer: >90 mL/min (ref >90)
Glucose, Bld: 87 mg/dL (ref 70–99)
Potassium: 4.4 mEq/L (ref 3.5–5.1)
Sodium: 139 mEq/L (ref 135–145)
Total Bilirubin: 0.2 mg/dL — ABNORMAL LOW (ref 0.3–1.2)
Total Protein: 7.2 g/dL (ref 6.0–8.3)

## 2011-07-31 LAB — CBC
HCT: 31.4 % — ABNORMAL LOW (ref 39.0–52.0)
Hemoglobin: 10.2 g/dL — ABNORMAL LOW (ref 13.0–17.0)
MCH: 28.5 pg (ref 26.0–34.0)
MCHC: 32.5 g/dL (ref 30.0–36.0)
MCV: 87.7 fL (ref 78.0–100.0)
Platelets: 415 10*3/uL — ABNORMAL HIGH (ref 150–400)
RBC: 3.58 MIL/uL — ABNORMAL LOW (ref 4.22–5.81)
RDW: 13.8 % (ref 11.5–15.5)
WBC: 5.9 10*3/uL (ref 4.0–10.5)

## 2011-07-31 LAB — DIFFERENTIAL
Basophils Absolute: 0 10*3/uL (ref 0.0–0.1)
Basophils Relative: 1 % (ref 0–1)
Eosinophils Absolute: 0.2 10*3/uL (ref 0.0–0.7)
Eosinophils Relative: 3 % (ref 0–5)
Lymphocytes Relative: 35 % (ref 12–46)
Lymphs Abs: 2 10*3/uL (ref 0.7–4.0)
Monocytes Absolute: 0.6 10*3/uL (ref 0.1–1.0)
Monocytes Relative: 11 % (ref 3–12)
Neutro Abs: 3 10*3/uL (ref 1.7–7.7)
Neutrophils Relative %: 51 % (ref 43–77)

## 2011-07-31 LAB — CK: Total CK: 258 U/L — ABNORMAL HIGH (ref 7–232)

## 2011-07-31 LAB — TROPONIN I: Troponin I: <0.3 ng/mL (ref <0.30)

## 2011-07-31 MED ORDER — DIAZEPAM 5 MG PO TABS: 5.0000 mg | ORAL_TABLET | Freq: Two times a day (BID) | ORAL | Status: AC

## 2011-07-31 MED ORDER — SODIUM CHLORIDE 0.9 % IV BOLUS (SEPSIS)
1000.0000 mL | Freq: Once | INTRAVENOUS | Status: AC
  Administered 2011-07-31: 1000 mL via INTRAVENOUS

## 2011-07-31 MED ORDER — SODIUM CHLORIDE 0.9 % IV SOLN
1000.0000 mL | INTRAVENOUS | Status: AC
  Administered 2011-07-31: 1000 mL via INTRAVENOUS

## 2011-07-31 NOTE — ED Provider Notes (Signed)
4:43 PM  Patient care resumed from Dr. Fonnie Jarvis and Trixie Dredge.  Patient able to be discharged due to labs listed below. It was advised to the patient that he stop taking his statin. He also will followup with Dr. Lajoyce Corners sometime this week to discuss this medication change and current muscle aches. The patient stated to me that he has been having muscle spasms. I will place the patient on valium as needed, however strict instructions were given not to take valium and home medications including kolonipin and vicoden for increased risk of falling.   Component     Latest Ref Rng 07/31/2011  CK Total     7 - 232 U/L 258 (H)  Troponin I     <0.30 ng/mL <0.30    [place], [place] 07/31/11 1643

## 2011-07-31 NOTE — ED Notes (Signed)
Patient states June 2012 left lower extremity compartment syndrome unknown reason and then two weeks ago developed compartment syndrome right lower extremity. Developed 3 days ago chest pain bilateral upper extremity pain and bilateral lower extremity pain.  Took pain medication at home for compartment syndrome and pain resolved 0/10. Currently pain now 3/10 achy pain. Airway intact bilateral equal chest rise and fall.  Edema right foot +2 and left foot trace. Full sensation.

## 2011-07-31 NOTE — ED Provider Notes (Signed)
History     CSN: [id number] Arrival date & time: 07/31/2011 12:39 PM   First MD Initiated Contact with Patient 07/31/11 1259      Chief Complaint  Patient presents with  . Arm Pain    (Consider location/radiation/quality/duration/timing/severity/associated sxs/prior treatment) HPI This 35 year old male has had compartment syndrome in each leg in the past with an unusual history of no trauma but just sudden severe acute sharp pain. He now presents pain free however over the last several days for the least the last 4-5 days 24 hours a day and pain to both arms and both legs of the entire arm in the entire leg in both sides as well as chest pain 24 hours a day for several days and since he couldn't sleep well last night he took 4 Klonopin this morning but was able to rest and then became totally pain-free. He now feels back to baseline with baseline slight numbness to both feet with mild swelling to both feet which is baseline for him. There is no color change to his arms or his legs no exertional chest pain no cough no shortness of breath no fever no confusion no localizing new weakness or numbness or other concerns. Past Medical History  Diagnosis Date  . Anemia     post operative  . Hyperlipidemia   . Schizophrenia   . Anxiety   . Obesity   . Cellulitis     bilateral thighs    Past Surgical History  Procedure Date  . I&d extremity     left leg  . Tonsillectomy   . Wisdom tooth extraction     Family History  Problem Relation Age of Onset  . GER disease Father   . Diverticulosis Father   . Colon cancer Neg Hx     History  Substance Use Topics  . Smoking status: Never Smoker   . Smokeless tobacco: Never Used  . Alcohol Use: No      Review of Systems  Constitutional: Negative for fever.       10 Systems reviewed and are negative for acute change except as noted in the HPI.  HENT: Negative for congestion.   Eyes: Negative for discharge and redness.  Respiratory:  Negative for cough and shortness of breath.   Cardiovascular: Positive for chest pain.  Gastrointestinal: Negative for vomiting and abdominal pain.  Musculoskeletal: Positive for myalgias. Negative for back pain.  Skin: Negative for rash.  Neurological: Negative for syncope, numbness and headaches.  Psychiatric/Behavioral:       No behavior change.    Allergies  Haldol  Home Medications   Current Outpatient Rx  Name Route Sig Dispense Refill  . CLONAZEPAM 1 MG PO TABS Oral Take 1 mg by mouth 2 (two) times daily as needed.      Marland Kitchen DIVALPROEX SODIUM 500 MG PO TBEC Oral Take 1,000 mg by mouth at bedtime.      Marland Kitchen DOXYCYCLINE MONOHYDRATE 100 MG PO TABS Oral Take 100 mg by mouth daily.      . FENOFIBRATE MICRONIZED 134 MG PO CAPS Oral Take 134 mg by mouth daily.      Marland Kitchen FLUOXETINE HCL 20 MG PO TABS Oral Take 20 mg by mouth daily.      Marland Kitchen HYDROCODONE-ACETAMINOPHEN 5-500 MG PO TABS Oral Take 1 tablet by mouth every 6 (six) hours as needed. For pain     . MULTIVITAMINS PO CAPS Oral Take 1 capsule by mouth daily.      Marland Kitchen  NAPROXEN SODIUM 220 MG PO TABS Oral Take 220 mg by mouth every 8 (eight) hours as needed.      Marland Kitchen PRAVASTATIN SODIUM 40 MG PO TABS Oral Take 40 mg by mouth daily.      Marland Kitchen DIAZEPAM 5 MG PO TABS Oral Take 1 tablet (5 mg total) by mouth 2 (two) times daily. 10 tablet 0    BP 119/84  Pulse 85  Temp(Src) 98.6 F (37 C) (Oral)  Resp 18  SpO2 98%  Physical Exam  Nursing note and vitals reviewed. Constitutional:       Awake, alert, nontoxic appearance.  HENT:  Head: Atraumatic.  Eyes: Right eye exhibits no discharge. Left eye exhibits no discharge.  Neck: Normal range of motion. Neck supple.  Cardiovascular: Normal rate and regular rhythm.   No murmur heard. Pulmonary/Chest: Effort normal and breath sounds normal. No respiratory distress. He has no wheezes. He has no rales. He exhibits no tenderness.  Abdominal: Soft. There is no tenderness. There is no rebound.    Musculoskeletal: He exhibits edema. He exhibits no tenderness.       Baseline ROM, no obvious new focal weakness.  He does have baseline decreased light touch to both feet, both hands and both feet capillary refill less than 2 seconds and are warm with baseline light touch, he moves all 4 Shoney's at baseline, his right lower leg wound has healed well with no erythema or dehiscence or purulent drainage, both arms and both legs are totally nontender at this time of initial examination.  Neurological:       Mental status and motor strength appears baseline for patient and situation.  Skin: No rash noted.  Psychiatric: He has a normal mood and affect.    ED Course  Procedures (including critical care time) The case was discussed with Dr. [name] who agrees to the ED assessment of checking labs it does not appear that the patient is at high risk for compartment syndrome at this time. If labs are unremarkable the patient can follow up with Dr. due to later this week and the patient will stop his pravastatin.  ECG: Normal sinus rhythm, ventricular rate 84 beats per minute, normal axis, normal intervals, no acute ischemic changes noted, normal ECG, no significant change from prior ECG Labs Reviewed  CBC - Abnormal; Notable for the following:    RBC 3.58 (*)    Hemoglobin 10.2 (*)    HCT 31.4 (*)    Platelets 415 (*)    All other components within normal limits  COMPREHENSIVE METABOLIC PANEL - Abnormal; Notable for the following:    Total Bilirubin 0.2 (*)    All other components within normal limits  CK - Abnormal; Notable for the following:    Total CK 258 (*)    All other components within normal limits  DIFFERENTIAL  TROPONIN I  LAB REPORT - SCANNED   No results found.   1. Muscle ache   2.  Generalized pain.    MDM  See CDU holding PA note for ED Course.  Case d/w Ortho.  Pt stable in ED with no significant deterioration in condition.        Hurman Horn,  MD 08/02/11 [phone]

## 2011-07-31 NOTE — Telephone Encounter (Signed)
Returned call to [employer] mother, Rommie Dunn.

## 2011-07-31 NOTE — ED Notes (Signed)
Pt here for eval of bilateral arm pain, chest pain and bilateral leg pain x 5 days; pt sts can not sleep; pt with hx of compartment syndrome to bilateral LE; pt sts took 4 Klonipin this am with relief

## 2011-08-02 NOTE — ED Provider Notes (Signed)
Medical screening examination/treatment/procedure(s) were conducted as a shared visit with non-physician practitioner(s) and myself.  I personally evaluated the patient during the encounter  Hurman Horn, MD 08/02/11 2217

## 2011-08-11 ENCOUNTER — Telehealth: Payer: Self-pay | Admitting: *Deleted

## 2011-08-11 NOTE — Telephone Encounter (Signed)
Made appt for 11/27 @9am .

## 2011-08-15 ENCOUNTER — Other Ambulatory Visit: Payer: Medicare Other | Admitting: Lab

## 2011-08-15 ENCOUNTER — Telehealth: Payer: Self-pay | Admitting: Oncology

## 2011-08-15 ENCOUNTER — Ambulatory Visit (HOSPITAL_BASED_OUTPATIENT_CLINIC_OR_DEPARTMENT_OTHER): Payer: Medicare Other

## 2011-08-15 ENCOUNTER — Encounter: Payer: Self-pay | Admitting: Oncology

## 2011-08-15 ENCOUNTER — Ambulatory Visit (HOSPITAL_BASED_OUTPATIENT_CLINIC_OR_DEPARTMENT_OTHER): Payer: Medicare Other | Admitting: Oncology

## 2011-08-15 ENCOUNTER — Ambulatory Visit: Payer: Medicare Other

## 2011-08-15 DIAGNOSIS — F419 Anxiety disorder, unspecified: Secondary | ICD-10-CM | POA: Insufficient documentation

## 2011-08-15 DIAGNOSIS — D6861 Antiphospholipid syndrome: Secondary | ICD-10-CM

## 2011-08-15 DIAGNOSIS — D6859 Other primary thrombophilia: Secondary | ICD-10-CM

## 2011-08-15 DIAGNOSIS — R894 Abnormal immunological findings in specimens from other organs, systems and tissues: Secondary | ICD-10-CM

## 2011-08-15 DIAGNOSIS — R760 Raised antibody titer: Secondary | ICD-10-CM

## 2011-08-15 DIAGNOSIS — D689 Coagulation defect, unspecified: Secondary | ICD-10-CM

## 2011-08-15 DIAGNOSIS — D649 Anemia, unspecified: Secondary | ICD-10-CM

## 2011-08-15 DIAGNOSIS — D509 Iron deficiency anemia, unspecified: Secondary | ICD-10-CM

## 2011-08-15 HISTORY — DX: Raised antibody titer: R76.0

## 2011-08-15 HISTORY — DX: Antiphospholipid syndrome: D68.61

## 2011-08-15 LAB — CBC WITH DIFFERENTIAL/PLATELET
BASO%: 0.7 % (ref 0.0–2.0)
Basophils Absolute: 0 10*3/uL (ref 0.0–0.1)
EOS%: 3.5 % (ref 0.0–7.0)
Eosinophils Absolute: 0.2 10*3/uL (ref 0.0–0.5)
HCT: 36.3 % — ABNORMAL LOW (ref 38.4–49.9)
HGB: 11.7 g/dL — ABNORMAL LOW (ref 13.0–17.1)
LYMPH%: 29.7 % (ref 14.0–49.0)
MCH: 27.8 pg (ref 27.2–33.4)
MCHC: 32.4 g/dL (ref 32.0–36.0)
MCV: 85.8 fL (ref 79.3–98.0)
MONO#: 0.6 10*3/uL (ref 0.1–0.9)
MONO%: 10.7 % (ref 0.0–14.0)
NEUT#: 3.1 10*3/uL (ref 1.5–6.5)
NEUT%: 55.4 % (ref 39.0–75.0)
Platelets: 315 10*3/uL (ref 140–400)
RBC: 4.23 10*6/uL (ref 4.20–5.82)
RDW: 15.7 % — ABNORMAL HIGH (ref 11.0–14.6)
WBC: 5.6 10*3/uL (ref 4.0–10.3)
lymph#: 1.7 10*3/uL (ref 0.9–3.3)

## 2011-08-15 NOTE — Telephone Encounter (Signed)
gv pt appt for dec2012.  sent pt back to labs

## 2011-08-16 ENCOUNTER — Telehealth: Payer: Self-pay | Admitting: Oncology

## 2011-08-16 ENCOUNTER — Other Ambulatory Visit: Payer: Self-pay | Admitting: Oncology

## 2011-08-16 ENCOUNTER — Ambulatory Visit: Payer: Medicare Other | Admitting: Oncology

## 2011-08-16 MED ORDER — FERUMOXYTOL INJECTION 510 MG/17 ML
510.0000 mg | INTRAVENOUS | Status: DC
  Filled 2011-08-16: qty 17

## 2011-08-16 NOTE — Progress Notes (Signed)
Joseph [name] [id number] [birth year] 35 y.o. 08/16/2011 12:38 PM  CC [name], MD  REASON FOR CONSULTATION:  35 year old gentleman with bilateral compartment syndrome I had originally seen the patient as an inpatient consultation. Patient is seen in followup post hospitalization discharge.   REFERRING PHYSICIAN: [name], M.D.  HISTORY OF PRESENT ILLNESS:  Joseph [name] is a 35 y.o. male.  Pleasant gentleman. Patient has a history of having recurrent lateral compartment syndrome. Most recently he was admitted on 07/09/2011. Weighted on compartment syndrome of the anterior and lateral compartment of the right leg. Previously he had had a compartment syndrome of the left leg. He underwent a fasciotomy anterior and lateral compartments of the right leg. He also had excision of necrotic muscle of the entire entire anterior and lateral compartments. Then he had placement of a wound VAC. Patient's primary care physician consulted me for discussion of whether there was a hematologic reason for patient developing recurrent compartments syndromes. A full hypercoagulable panel was performed. The results of which are now known. He is noted to have a lupus anticoagulant. Also he had several protein S protein seed that were elevated. His protein S total was 167 protein C. was normal. The functional protein C was normal as well. PTT was elevated at 49.5. The one border 1 mixing study was elevated at 49.1 indicating that her lupus anticoagulant was present her postoperatively he did well and then he was sent home. Also anticardiolipin antibody was performed his cardiolipin antibody IgG was greater than 11 IgM greater than 10 IgA greater than 13.  Today patient is seen in followup with his parents accompanying him.   Past Medical History  Diagnosis Date  . Anemia     post operative  . Hyperlipidemia   . Schizophrenia   . Anxiety   . Obesity   . Cellulitis     bilateral thighs  . 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 Antiphospholipid syndrome 08/15/2011    Past Surgical History  Procedure Date  . I&d extremity     left leg  . Tonsillectomy   . Wisdom tooth extraction     Family History  Problem Relation Age of Onset  . GER disease Father   . Diverticulosis Father   . Colon cancer Neg Hx     Social History History  Substance Use Topics  . Smoking status: Never Smoker   . Smokeless tobacco: Never Used  . Alcohol Use: No    Allergies  Allergen Reactions  . Haldol (Haloperidol Decanoate)     Current Outpatient Prescriptions  Medication Sig Dispense Refill  . clonazePAM (KLONOPIN) 1 MG tablet Take 1 mg by mouth 2 (two) times daily as needed.        . divalproex (DEPAKOTE) 500 MG EC tablet Take 1,000 mg by mouth at bedtime.        Marland Kitchen FLUoxetine (PROZAC) 20 MG tablet Take 20 mg by mouth daily.        Marland Kitchen HYDROcodone-acetaminophen (VICODIN) 5-500 MG per tablet Take 1 tablet by mouth every 6 (six) hours as needed. For pain       . Multiple Vitamin (MULTIVITAMIN) capsule Take 1 capsule by mouth daily.        . naproxen sodium (ANAPROX) 220 MG tablet Take 220 mg by mouth every 8 (eight) hours as needed.        . pravastatin (PRAVACHOL) 40 MG tablet Take 40 mg by mouth daily.        Marland Kitchen  SitaGLIPtin-MetFORMIN HCl (JANUMET XR) 50-1000 MG TB24 Take 1 tablet by mouth daily.         No current facility-administered medications for this visit.   Facility-Administered Medications Ordered in Other Visits  Medication Dose Route Frequency Provider Last Rate Last Dose  . ferumoxytol (FERAHEME) injection 510 mg  510 mg Intravenous Weekly Victorino December, MD        REVIEW OF SYSTEMS:  Constitutional: negative Eyes: negative Ears, nose, mouth, throat, and face: negative Respiratory: negative Cardiovascular: negative Gastrointestinal: negative Genitourinary:negative Integument/breast: negative Hematologic/lymphatic: negative Musculoskeletal:negative, Patient has a  brace on the right leg. He also has it in his left leg as well. There is no edema noted. Neurological: negative  PHYSICAL EXAMINATION:  JXB:JYNWG, healthy, no distress, well nourished, well developed and smiling SKIN: skin color, texture, turgor are normal, no rashes or significant lesions HEAD: No masses, lesions, tenderness or abnormalities EYES: normal, PERRLA, EOMI, Conjunctiva are pink and non-injected, sclera clear EARS: External ears normal OROPHARYNX:no exudate, no erythema and lips, buccal mucosa, and tongue normal  NECK: supple, no adenopathy, no bruits, no JVD, thyroid normal size, non-tender, without nodularity LYMPH:  no palpable lymphadenopathy, no hepatosplenomegaly BREAST:not examined LUNGS: negative findings:  chest symmetric with normal A/P diameter, no chest deformities noted, normal respiratory rate and rhythm, no chest wall tenderness HEART: regular rate & rhythm, no murmurs and no gallops ABDOMEN:abdomen soft, non-tender and obese BACK: Back symmetric, no curvature., No CVA tenderness EXTREMITIES:positive findings:  edema +1 bilaterally braces are noted he has a walking June the right leg  NEURO: alert & oriented x 3 with fluent speech, no focal motor/sensory deficits    STUDIES/RESULTS: Dg Chest 2 View  07/31/2011  *RADIOLOGY REPORT*  Clinical Data: Chest pain  CHEST - 2 VIEW  Comparison: 03/17/2011  Findings: Cardiomediastinal silhouette is stable.  No acute infiltrate or pleural effusion.  No pulmonary edema.  Bony thorax is stable.  IMPRESSION: No active disease.  Original Report Authenticated By: Natasha Mead, M.D.      ASSESSMENT    35 year old gentleman with recurrent compartment syndromes. He most recently was admitted with right leg compartment syndrome. There was extensive necrotic tissue noted. He underwent a successful debridement and excision and fasciotomy. Patient had a focal hypercoag panel performed he is noted to have a lupus anticoagulant.  Question is whether this is the what is causing his compartment syndrome. Might my thinking is not but it certainly could be a possibility. Of note patient has never had previous blood clots or any other problems due to hypercoagulability. Patient also has an antiphospholipid possible syndrome. My recommendation at this time is for the patient to have a repeat hypoechoic panel and then we will discuss further as to the treatment should he continue to have persistence of a lupus anticoagulant.    PLAN:    Repeat hypercoag panel today. She is also anemic. I will check iron studies on him as well. I did ask them to [place] to have the patient start iron orally. However his ferritin is very low then I would recommend parenteral iron. I spent significant amount of time with the patient and his parents discussing hypercoagulability and the workup that it entails. They appreciated this visit. I will set him up for a followup visit after I have the results of the new hypercoag panel.     All questions were answered. The patient knows to call the clinic with any problems, questions or concerns. We can certainly see the patient  much sooner if necessary.  Thank you so much for allowing me to participate in the care of Doyce Loose. I will continue to follow up the patient with you and assist in his care.  I spent 40 minutes counseling the patient face to face. The total time spent in the appointment was 60 minutes. Drue Second, MD Medical/Oncology [facility] [phone] (beeper) [phone] (Office)  08/16/2011, 12:38 PM 08/16/2011, 12:38 PM

## 2011-08-16 NOTE — Telephone Encounter (Signed)
called pt lmovm for appts on 11/28 and 12/05

## 2011-08-21 LAB — COMPREHENSIVE METABOLIC PANEL
ALT: 30 U/L (ref 0–53)
AST: 23 U/L (ref 0–37)
Albumin: 4.6 g/dL (ref 3.5–5.2)
Alkaline Phosphatase: 58 U/L (ref 39–117)
BUN: 10 mg/dL (ref 6–23)
CO2: 22 mEq/L (ref 19–32)
Calcium: 9.5 mg/dL (ref 8.4–10.5)
Chloride: 107 mEq/L (ref 96–112)
Creatinine, Ser: 0.95 mg/dL (ref 0.50–1.35)
Glucose, Bld: 105 mg/dL — ABNORMAL HIGH (ref 70–99)
Potassium: 4.9 mEq/L (ref 3.5–5.3)
Sodium: 139 mEq/L (ref 135–145)
Total Bilirubin: 0.2 mg/dL — ABNORMAL LOW (ref 0.3–1.2)
Total Protein: 7.2 g/dL (ref 6.0–8.3)

## 2011-08-21 LAB — HYPERCOAGULABLE PANEL, COMPREHENSIVE
AntiThromb III Func: 120 % (ref 76–126)
Anticardiolipin IgA: 4 APL U/mL (ref <22)
Anticardiolipin IgG: 5 GPL U/mL (ref <23)
Anticardiolipin IgM: 2 MPL U/mL (ref <11)
Beta-2 Glyco I IgG: 0 G Units (ref <20)
Beta-2-Glycoprotein I IgA: 2 A Units (ref <20)
Beta-2-Glycoprotein I IgM: 2 M Units (ref <20)
DRVVT: 37 secs (ref 34.1–42.2)
Lupus Anticoagulant: NOT DETECTED
PTT Lupus Anticoagulant: 36.5 secs (ref 28.0–43.0)
Protein C Activity: 144 % — ABNORMAL HIGH (ref 75–133)
Protein C, Total: 85 % (ref 72–160)
Protein S Activity: 78 % (ref 69–129)
Protein S Total: 125 % (ref 60–150)

## 2011-08-21 LAB — IRON AND TIBC
%SAT: 10 % — ABNORMAL LOW (ref 20–55)
Iron: 44 ug/dL (ref 42–165)
TIBC: 438 ug/dL — ABNORMAL HIGH (ref 215–435)
UIBC: 394 ug/dL (ref 125–400)

## 2011-08-21 LAB — FERRITIN: Ferritin: 17 ng/mL — ABNORMAL LOW (ref 22–322)

## 2011-08-22 ENCOUNTER — Other Ambulatory Visit: Payer: Self-pay | Admitting: Oncology

## 2011-08-22 ENCOUNTER — Ambulatory Visit: Payer: Medicare Other | Admitting: *Deleted

## 2011-08-22 DIAGNOSIS — D509 Iron deficiency anemia, unspecified: Secondary | ICD-10-CM

## 2011-08-23 ENCOUNTER — Ambulatory Visit: Payer: Medicare Other

## 2011-08-25 ENCOUNTER — Telehealth: Payer: Self-pay | Admitting: *Deleted

## 2011-08-25 NOTE — Telephone Encounter (Signed)
Called to remind pt of appt for Iron infusion. LMOVM appt 12/5 at 9am. To call with concerns

## 2011-08-25 NOTE — Telephone Encounter (Signed)
Message copied by Cooper Render on Fri Aug 25, 2011 12:38 PM ------      Message from: Victorino December      Created: Wed Aug 16, 2011 11:19 AM       Please call patient and let them know that they need to come in for IV iron , POF to schedulers

## 2011-08-30 ENCOUNTER — Ambulatory Visit: Payer: Medicare Other

## 2011-09-15 ENCOUNTER — Telehealth: Payer: Self-pay | Admitting: *Deleted

## 2011-09-15 ENCOUNTER — Ambulatory Visit (HOSPITAL_BASED_OUTPATIENT_CLINIC_OR_DEPARTMENT_OTHER): Payer: Medicare Other | Admitting: Oncology

## 2011-09-15 ENCOUNTER — Ambulatory Visit: Payer: Medicare Other

## 2011-09-15 ENCOUNTER — Telehealth: Payer: Self-pay | Admitting: Oncology

## 2011-09-15 ENCOUNTER — Other Ambulatory Visit: Payer: Medicare Other | Admitting: Lab

## 2011-09-15 VITALS — BP 123/74 | HR 73

## 2011-09-15 DIAGNOSIS — D509 Iron deficiency anemia, unspecified: Secondary | ICD-10-CM

## 2011-09-15 DIAGNOSIS — T79A0XA Compartment syndrome, unspecified, initial encounter: Secondary | ICD-10-CM

## 2011-09-15 DIAGNOSIS — D649 Anemia, unspecified: Secondary | ICD-10-CM

## 2011-09-15 MED ORDER — SODIUM CHLORIDE 0.9 % IV SOLN: Freq: Once | INTRAVENOUS | Status: DC

## 2011-09-15 MED ORDER — FERUMOXYTOL INJECTION 510 MG/17 ML
510.0000 mg | Freq: Once | INTRAVENOUS | Status: AC
  Administered 2011-09-15: 510 mg via INTRAVENOUS
  Filled 2011-09-15: qty 17

## 2011-09-15 NOTE — Telephone Encounter (Signed)
Gv pt appt for dec201

## 2011-09-15 NOTE — Patient Instructions (Signed)
Call md for problems 

## 2011-09-15 NOTE — Telephone Encounter (Signed)
gave patient appointments for 09-15-2011 and 09-22-2011 and the 2013 asked michelle to print out calendar and give to the patient

## 2011-09-20 NOTE — Progress Notes (Signed)
OFFICE PROGRESS NOTE  CC Nadara Mustard, MD, MD [address] [place] [address]  DIAGNOSIS: 35 year old gentleman with bilateral compartment syndrome and iron deficiency   CURRENT THERAPY:patient will received parenteral iron weekly x2 weeks  INTERVAL HISTORY: Joseph [name] 35 y.o. male returns forfollowup visit today. We discussed patient's full hypoechoic panel which was nonrevealing. I have discussed this with the patient's family. I do not think that his compartment syndrome was secondary to an inherited hypocoagulability. Clinically patient seems to be doing well he has not had any recurrence of his compartment syndrome. He denies any fevers chills night sweats headaches shortness of breath chest pains palpitations no lower extremity edema or swelling or pain.  MEDICAL HISTORY: Past Medical History  Diagnosis Date  . Anemia     post operative  . Hyperlipidemia   . Schizophrenia   . Anxiety   . Obesity   . Cellulitis     bilateral thighs  . Lupus anticoagulant positive 08/15/2011  . Antiphospholipid syndrome 08/15/2011    ALLERGIES:  is allergic to haldol.  MEDICATIONS:  Current Outpatient Prescriptions  Medication Sig Dispense Refill  . clonazePAM (KLONOPIN) 1 MG tablet Take 1 mg by mouth 2 (two) times daily as needed.        . divalproex (DEPAKOTE) 500 MG EC tablet Take 1,000 mg by mouth at bedtime.        Marland Kitchen FLUoxetine (PROZAC) 20 MG tablet Take 20 mg by mouth daily.        . Multiple Vitamin (MULTIVITAMIN) capsule Take 1 capsule by mouth daily.        . pravastatin (PRAVACHOL) 40 MG tablet Take 40 mg by mouth daily.        . SitaGLIPtin-MetFORMIN HCl (JANUMET XR) 50-1000 MG TB24 Take 1 tablet by mouth daily.        Marland Kitchen HYDROcodone-acetaminophen (VICODIN) 5-500 MG per tablet Take 1 tablet by mouth every 6 (six) hours as needed. For pain       . naproxen sodium (ANAPROX) 220 MG tablet Take 220 mg by mouth every 8 (eight) hours as needed.           SURGICAL HISTORY:  Past Surgical History  Procedure Date  . I&d extremity     left leg  . Tonsillectomy   . Wisdom tooth extraction     REVIEW OF SYSTEMS:  Pertinent items are noted in HPI.    ECOG PERFORMANCE STATUS: 1 - Symptomatic but completely ambulatory  Blood pressure 131/76, pulse 76, temperature 98.2 F (36.8 C), height 6' 0.5" (1.842 m), weight 251 lb 6.4 oz (114.034 kg).  LABORATORY DATA: Lab Results  Component Value Date   WBC 5.6 08/15/2011   HGB 11.7* 08/15/2011   HCT 36.3* 08/15/2011   MCV 85.8 08/15/2011   PLT 315 08/15/2011      Chemistry      Component Value Date/Time   NA 139 08/15/2011 1129   K 4.9 08/15/2011 1129   CL 107 08/15/2011 1129   CO2 22 08/15/2011 1129   BUN 10 08/15/2011 1129   CREATININE 0.95 08/15/2011 1129      Component Value Date/Time   CALCIUM 9.5 08/15/2011 1129   ALKPHOS 58 08/15/2011 1129   AST 23 08/15/2011 1129   ALT 30 08/15/2011 1129   BILITOT 0.2* 08/15/2011 1129     ASSESSMENT: 35 year old gentleman with  #1 history of bilateral lower extremity compartment syndrome. No primary hypercoagulability has been found to account for the development of  compartment syndrome. I do think that it this may be vascular in nature.  #2 patient does have anemia due to iron deficiency and we will give him IV iron infusions. Total of 2 weeks of iron is planned in the form of feraheme.   PLAN: he will receive dose #1 of feraheme today and he will return in one week's time for dose #2. After that we will check iron studies on him again.   All questions were answered. The patient knows to call the clinic with any problems, questions or concerns. We can certainly see the patient much sooner if necessary.  I spent 20 minutes counseling the patient face to face. The total time spent in the appointment was 30 minutes.    Drue Second, MD Medical/Oncology [facility] [phone] (beeper) [phone]  (Office)  09/20/2011, 12:12 PM

## 2011-09-22 ENCOUNTER — Ambulatory Visit (HOSPITAL_BASED_OUTPATIENT_CLINIC_OR_DEPARTMENT_OTHER): Payer: Medicare Other

## 2011-09-22 VITALS — BP 118/75 | HR 88 | Temp 98.7°F

## 2011-09-22 DIAGNOSIS — D509 Iron deficiency anemia, unspecified: Secondary | ICD-10-CM

## 2011-09-22 MED ORDER — FERUMOXYTOL INJECTION 510 MG/17 ML
510.0000 mg | Freq: Once | INTRAVENOUS | Status: AC
  Administered 2011-09-22: 510 mg via INTRAVENOUS
  Filled 2011-09-22: qty 17

## 2011-09-22 MED ORDER — SODIUM CHLORIDE 0.9 % IV SOLN
Freq: Once | INTRAVENOUS | Status: AC
  Administered 2011-09-22: 15:00:00 via INTRAVENOUS

## 2011-09-22 NOTE — Patient Instructions (Signed)
1515 Pt ambulatory upon discharge and father at side.  Both verbalized understanding of next appt date and to call if any problems.

## 2011-10-07 DIAGNOSIS — F200 Paranoid schizophrenia: Secondary | ICD-10-CM | POA: Diagnosis not present

## 2011-10-24 DIAGNOSIS — F200 Paranoid schizophrenia: Secondary | ICD-10-CM | POA: Diagnosis not present

## 2011-11-03 ENCOUNTER — Emergency Department (INDEPENDENT_AMBULATORY_CARE_PROVIDER_SITE_OTHER): Payer: Medicare Other

## 2011-11-03 ENCOUNTER — Inpatient Hospital Stay (HOSPITAL_BASED_OUTPATIENT_CLINIC_OR_DEPARTMENT_OTHER)
Admission: EM | Admit: 2011-11-03 | Discharge: 2011-11-06 | DRG: 195 | Disposition: A | Discharge status: Home / Self Care | Payer: Medicare Other | Attending: Internal Medicine | Admitting: Internal Medicine

## 2011-11-03 ENCOUNTER — Encounter (HOSPITAL_BASED_OUTPATIENT_CLINIC_OR_DEPARTMENT_OTHER): Payer: Self-pay

## 2011-11-03 DIAGNOSIS — R0602 Shortness of breath: Secondary | ICD-10-CM

## 2011-11-03 DIAGNOSIS — Z0000 Encounter for general adult medical examination without abnormal findings: Secondary | ICD-10-CM | POA: Diagnosis not present

## 2011-11-03 DIAGNOSIS — Z79899 Other long term (current) drug therapy: Secondary | ICD-10-CM

## 2011-11-03 DIAGNOSIS — J189 Pneumonia, unspecified organism: Principal | ICD-10-CM | POA: Diagnosis present

## 2011-11-03 DIAGNOSIS — Z23 Encounter for immunization: Secondary | ICD-10-CM

## 2011-11-03 DIAGNOSIS — R0989 Other specified symptoms and signs involving the circulatory and respiratory systems: Secondary | ICD-10-CM

## 2011-11-03 DIAGNOSIS — R069 Unspecified abnormalities of breathing: Secondary | ICD-10-CM | POA: Diagnosis not present

## 2011-11-03 DIAGNOSIS — E119 Type 2 diabetes mellitus without complications: Secondary | ICD-10-CM | POA: Diagnosis present

## 2011-11-03 DIAGNOSIS — M79609 Pain in unspecified limb: Secondary | ICD-10-CM | POA: Diagnosis not present

## 2011-11-03 DIAGNOSIS — D649 Anemia, unspecified: Secondary | ICD-10-CM | POA: Diagnosis present

## 2011-11-03 DIAGNOSIS — E785 Hyperlipidemia, unspecified: Secondary | ICD-10-CM | POA: Diagnosis present

## 2011-11-03 DIAGNOSIS — G4730 Sleep apnea, unspecified: Secondary | ICD-10-CM | POA: Diagnosis present

## 2011-11-03 DIAGNOSIS — J029 Acute pharyngitis, unspecified: Secondary | ICD-10-CM | POA: Diagnosis not present

## 2011-11-03 DIAGNOSIS — R0902 Hypoxemia: Secondary | ICD-10-CM | POA: Diagnosis present

## 2011-11-03 DIAGNOSIS — R918 Other nonspecific abnormal finding of lung field: Secondary | ICD-10-CM | POA: Diagnosis not present

## 2011-11-03 DIAGNOSIS — F1010 Alcohol abuse, uncomplicated: Secondary | ICD-10-CM | POA: Diagnosis present

## 2011-11-03 DIAGNOSIS — F259 Schizoaffective disorder, unspecified: Secondary | ICD-10-CM | POA: Diagnosis present

## 2011-11-03 DIAGNOSIS — J069 Acute upper respiratory infection, unspecified: Secondary | ICD-10-CM | POA: Diagnosis not present

## 2011-11-03 DIAGNOSIS — D72829 Elevated white blood cell count, unspecified: Secondary | ICD-10-CM | POA: Diagnosis present

## 2011-11-03 DIAGNOSIS — R894 Abnormal immunological findings in specimens from other organs, systems and tissues: Secondary | ICD-10-CM | POA: Diagnosis present

## 2011-11-03 DIAGNOSIS — R0609 Other forms of dyspnea: Secondary | ICD-10-CM | POA: Diagnosis not present

## 2011-11-03 DIAGNOSIS — M79A29 Nontraumatic compartment syndrome of unspecified lower extremity: Secondary | ICD-10-CM | POA: Diagnosis not present

## 2011-11-03 DIAGNOSIS — F411 Generalized anxiety disorder: Secondary | ICD-10-CM | POA: Diagnosis present

## 2011-11-03 DIAGNOSIS — F209 Schizophrenia, unspecified: Secondary | ICD-10-CM | POA: Diagnosis present

## 2011-11-03 HISTORY — DX: Compartment syndrome, unspecified, initial encounter: T79.A0XA

## 2011-11-03 LAB — COMPREHENSIVE METABOLIC PANEL
ALT: 73 U/L — ABNORMAL HIGH (ref 0–53)
AST: 42 U/L — ABNORMAL HIGH (ref 0–37)
Albumin: 3.4 g/dL — ABNORMAL LOW (ref 3.5–5.2)
Alkaline Phosphatase: 74 U/L (ref 39–117)
BUN: 12 mg/dL (ref 6–23)
CO2: 27 mEq/L (ref 19–32)
Calcium: 8.2 mg/dL — ABNORMAL LOW (ref 8.4–10.5)
Chloride: 100 mEq/L (ref 96–112)
Creatinine, Ser: 0.9 mg/dL (ref 0.50–1.35)
GFR calc Af Amer: >90 mL/min (ref >90)
GFR calc non Af Amer: >90 mL/min (ref >90)
Glucose, Bld: 104 mg/dL — ABNORMAL HIGH (ref 70–99)
Potassium: 3.7 mEq/L (ref 3.5–5.1)
Sodium: 138 mEq/L (ref 135–145)
Total Bilirubin: 0.2 mg/dL — ABNORMAL LOW (ref 0.3–1.2)
Total Protein: 6.5 g/dL (ref 6.0–8.3)

## 2011-11-03 LAB — GLUCOSE, CAPILLARY
Glucose-Capillary: 115 mg/dL — ABNORMAL HIGH (ref 70–99)
Glucose-Capillary: 99 mg/dL (ref 70–99)

## 2011-11-03 LAB — DIFFERENTIAL
Basophils Absolute: 0 10*3/uL (ref 0.0–0.1)
Basophils Relative: 0 % (ref 0–1)
Eosinophils Absolute: 0.1 10*3/uL (ref 0.0–0.7)
Eosinophils Relative: 1 % (ref 0–5)
Lymphocytes Relative: 13 % (ref 12–46)
Lymphs Abs: 1.8 10*3/uL (ref 0.7–4.0)
Monocytes Absolute: 1.3 10*3/uL — ABNORMAL HIGH (ref 0.1–1.0)
Monocytes Relative: 9 % (ref 3–12)
Neutro Abs: 11.4 10*3/uL — ABNORMAL HIGH (ref 1.7–7.7)
Neutrophils Relative %: 78 % — ABNORMAL HIGH (ref 43–77)

## 2011-11-03 LAB — CBC
HCT: 36.5 % — ABNORMAL LOW (ref 39.0–52.0)
Hemoglobin: 12.3 g/dL — ABNORMAL LOW (ref 13.0–17.0)
MCH: 28.9 pg (ref 26.0–34.0)
MCHC: 33.7 g/dL (ref 30.0–36.0)
MCV: 85.7 fL (ref 78.0–100.0)
Platelets: 187 10*3/uL (ref 150–400)
RBC: 4.26 MIL/uL (ref 4.22–5.81)
RDW: 18.9 % — ABNORMAL HIGH (ref 11.5–15.5)
WBC: 14.6 10*3/uL — ABNORMAL HIGH (ref 4.0–10.5)

## 2011-11-03 MED ORDER — DEXTROSE 5 % IV SOLN
INTRAVENOUS | Status: AC
  Administered 2011-11-03: 500 mg via INTRAVENOUS
  Filled 2011-11-03: qty 500

## 2011-11-03 MED ORDER — DEXTROSE 5 % IV SOLN
500.0000 mg | Freq: Once | INTRAVENOUS | Status: AC
  Administered 2011-11-03: 500 mg via INTRAVENOUS

## 2011-11-03 MED ORDER — DEXTROSE 5 % IV SOLN
INTRAVENOUS | Status: AC
  Administered 2011-11-03: 1 g via INTRAVENOUS
  Filled 2011-11-03: qty 10

## 2011-11-03 MED ORDER — DEXTROSE 5 % IV SOLN
1.0000 g | Freq: Once | INTRAVENOUS | Status: AC
  Administered 2011-11-03: 1 g via INTRAVENOUS

## 2011-11-03 MED ORDER — PNEUMOCOCCAL VAC POLYVALENT 25 MCG/0.5ML IJ INJ
0.5000 mL | INJECTION | INTRAMUSCULAR | Status: AC
  Administered 2011-11-04: 0.5 mL via INTRAMUSCULAR
  Filled 2011-11-03: qty 0.5

## 2011-11-03 MED ORDER — BIOTENE DRY MOUTH MT LIQD
15.0000 mL | Freq: Two times a day (BID) | OROMUCOSAL | Status: DC
Start: 2011-11-03 — End: 2011-11-06
  Administered 2011-11-04 – 2011-11-06 (×4): 15 mL via OROMUCOSAL

## 2011-11-03 MED ORDER — ALBUTEROL SULFATE (5 MG/ML) 0.5% IN NEBU
5.0000 mg | INHALATION_SOLUTION | Freq: Once | RESPIRATORY_TRACT | Status: AC
  Administered 2011-11-03: 5 mg via RESPIRATORY_TRACT
  Filled 2011-11-03: qty 1

## 2011-11-03 MED ORDER — IOHEXOL 350 MG/ML SOLN: 100.0000 mL | Freq: Once | INTRAVENOUS | Status: AC | PRN

## 2011-11-03 MED ORDER — ACETAMINOPHEN 500 MG PO TABS
1000.0000 mg | ORAL_TABLET | Freq: Once | ORAL | Status: AC
  Administered 2011-11-03: 1000 mg via ORAL
  Filled 2011-11-03: qty 2

## 2011-11-03 MED ORDER — SODIUM CHLORIDE 0.9 % IV SOLN
INTRAVENOUS | Status: DC
  Administered 2011-11-03: 21:00:00 via INTRAVENOUS

## 2011-11-03 NOTE — ED Notes (Addendum)
Pt parents who are now at bedside, they state that pt took two [place] and drank a beer last night then went to bed.  Pt continues to be very drowsy unless he is continuously stimulated.  Pt was found with bed in a reclined position, mouth gaping open, with sonorous respirations.  Pt was stimulated and informed that he need to sit up in the bed and attempt to remain awake.  Pt and family were educated on obstructive sleep apnea.

## 2011-11-03 NOTE — ED Provider Notes (Signed)
History     CSN: [id number]  Arrival date & time 11/03/11  1600   First MD Initiated Contact with Patient 11/03/11 1629      Chief Complaint  Patient presents with  . Shortness of Breath    (Consider location/radiation/quality/duration/timing/severity/associated sxs/prior treatment) HPI Comments: Started last night with blood-streaked sputum, today woke short of breath.  Patient is a 36 y.o. male presenting with shortness of breath. The history is provided by the patient.  Shortness of Breath  The current episode started yesterday. The problem occurs continuously. The problem has been gradually worsening. The problem is moderate. The symptoms are relieved by nothing. The symptoms are aggravated by nothing. Associated symptoms include shortness of breath. His past medical history does not include asthma. Urine output has been normal. There were no sick contacts. He has received no recent medical care.    Past Medical History  Diagnosis Date  . Anemia     post operative  . Hyperlipidemia   . Schizophrenia   . Anxiety   . Obesity   . Cellulitis     bilateral thighs  . Lupus anticoagulant positive 08/15/2011  . Antiphospholipid syndrome 08/15/2011  . Compartment syndrome     Past Surgical History  Procedure Date  . I&d extremity     left leg  . Tonsillectomy   . Wisdom tooth extraction   . Leg surgery     Family History  Problem Relation Age of Onset  . GER disease Father   . Diverticulosis Father   . Colon cancer Neg Hx     History  Substance Use Topics  . Smoking status: Never Smoker   . Smokeless tobacco: Never Used  . Alcohol Use: Yes      Review of Systems  Respiratory: Positive for shortness of breath.   All other systems reviewed and are negative.    Allergies  Haldol  Home Medications   Current Outpatient Rx  Name Route Sig Dispense Refill  . CLONAZEPAM 1 MG PO TABS Oral Take 1 mg by mouth 2 (two) times daily as needed. For anxiety    .  DIVALPROEX SODIUM 500 MG PO TBEC Oral Take 1,000 mg by mouth at bedtime.      . FLUOXETINE HCL 20 MG PO TABS Oral Take 20 mg by mouth daily.      Marland Kitchen HYDROCODONE-ACETAMINOPHEN 5-500 MG PO TABS Oral Take 1 tablet by mouth every 6 (six) hours as needed. For pain     . MULTIVITAMINS PO CAPS Oral Take 1 capsule by mouth daily.      Marland Kitchen PRAVASTATIN SODIUM 40 MG PO TABS Oral Take 40 mg by mouth daily.      Marland Kitchen SITAGLIPTIN-METFORMIN HCL ER 50-1000 MG PO TB24 Oral Take 1 tablet by mouth daily.        BP 130/70  Pulse 127  Temp(Src) 101.2 F (38.4 C) (Oral)  Resp 28  SpO2 93%  Physical Exam  Nursing note and vitals reviewed. Constitutional: He is oriented to person, place, and time. He appears well-developed and well-nourished. No distress.  HENT:  Head: Normocephalic and atraumatic.  Right Ear: External ear normal.  Left Ear: External ear normal.  Mouth/Throat: Oropharynx is clear and moist.  Neck: Normal range of motion. Neck supple.  Cardiovascular: Normal rate and regular rhythm.   No murmur heard. Pulmonary/Chest: Effort normal. No respiratory distress.       There are rales in the left base.    Abdominal: Soft. Bowel sounds are  normal. He exhibits no distension. There is no tenderness.  Musculoskeletal: Normal range of motion. He exhibits no edema.  Lymphadenopathy:    He has no cervical adenopathy.  Neurological: He is alert and oriented to person, place, and time.  Skin: Skin is warm and dry. He is not diaphoretic.    ED Course  Procedures (including critical care time)  Labs Reviewed  GLUCOSE, CAPILLARY - Abnormal; Notable for the following:    Glucose-Capillary 115 (*)    All other components within normal limits   No results found.   No diagnosis found.    MDM  Patient with hypoxia, tachycardia.  He is febrile and chest xray shows pneumonia.  No PE per ct scan.  I called and made arrangements for the patient to be transferred to [facility].  He was given rocephin and  zithromax (after blood cultures were drawn).          Geoffery Lyons, MD 11/03/11 2230

## 2011-11-03 NOTE — ED Notes (Signed)
C/o woke shob this am-prod cough started yesterday

## 2011-11-03 NOTE — ED Notes (Signed)
Pt was connected to continuous pulse ox monitoring.

## 2011-11-03 NOTE — ED Notes (Signed)
Pt answering all ?s appropriately-falls asleep in chair when not being spoken to-easily arouses

## 2011-11-03 NOTE — ED Notes (Signed)
Resp. Note: Arrived to give patient HHN as ordered.  Patient with obvious upper airway obstruction, SpO2 85%.  Resp effort with no air exhange, patient easily wakens to voice.

## 2011-11-04 ENCOUNTER — Encounter (HOSPITAL_COMMUNITY): Payer: Self-pay

## 2011-11-04 DIAGNOSIS — F259 Schizoaffective disorder, unspecified: Secondary | ICD-10-CM | POA: Diagnosis present

## 2011-11-04 DIAGNOSIS — F1010 Alcohol abuse, uncomplicated: Secondary | ICD-10-CM | POA: Diagnosis present

## 2011-11-04 DIAGNOSIS — E119 Type 2 diabetes mellitus without complications: Secondary | ICD-10-CM | POA: Diagnosis present

## 2011-11-04 DIAGNOSIS — G4730 Sleep apnea, unspecified: Secondary | ICD-10-CM | POA: Diagnosis present

## 2011-11-04 DIAGNOSIS — J189 Pneumonia, unspecified organism: Secondary | ICD-10-CM | POA: Diagnosis present

## 2011-11-04 LAB — CBC
HCT: 35.9 % — ABNORMAL LOW (ref 39.0–52.0)
Hemoglobin: 11.5 g/dL — ABNORMAL LOW (ref 13.0–17.0)
MCH: 28.4 pg (ref 26.0–34.0)
MCHC: 32 g/dL (ref 30.0–36.0)
MCV: 88.6 fL (ref 78.0–100.0)
Platelets: 172 10*3/uL (ref 150–400)
RBC: 4.05 MIL/uL — ABNORMAL LOW (ref 4.22–5.81)
RDW: 19.4 % — ABNORMAL HIGH (ref 11.5–15.5)
WBC: 10 10*3/uL (ref 4.0–10.5)

## 2011-11-04 LAB — INFLUENZA PANEL BY PCR (TYPE A & B)
H1N1 flu by pcr: NOT DETECTED
Influenza A By PCR: NEGATIVE
Influenza B By PCR: NEGATIVE

## 2011-11-04 LAB — GLUCOSE, CAPILLARY
Glucose-Capillary: 116 mg/dL — ABNORMAL HIGH (ref 70–99)
Glucose-Capillary: 170 mg/dL — ABNORMAL HIGH (ref 70–99)
Glucose-Capillary: 116 mg/dL — ABNORMAL HIGH (ref 70–99)
Glucose-Capillary: 105 mg/dL — ABNORMAL HIGH (ref 70–99)

## 2011-11-04 LAB — BASIC METABOLIC PANEL
BUN: 11 mg/dL (ref 6–23)
CO2: 30 mEq/L (ref 19–32)
Calcium: 8.4 mg/dL (ref 8.4–10.5)
Chloride: 103 mEq/L (ref 96–112)
Creatinine, Ser: 0.94 mg/dL (ref 0.50–1.35)
GFR calc Af Amer: >90 mL/min (ref >90)
GFR calc non Af Amer: >90 mL/min (ref >90)
Glucose, Bld: 109 mg/dL — ABNORMAL HIGH (ref 70–99)
Potassium: 3.8 mEq/L (ref 3.5–5.1)
Sodium: 139 mEq/L (ref 135–145)

## 2011-11-04 LAB — HEMOGLOBIN A1C
Hgb A1c MFr Bld: 5.8 % — ABNORMAL HIGH (ref <5.7)
Mean Plasma Glucose: 120 mg/dL — ABNORMAL HIGH (ref <117)

## 2011-11-04 LAB — VALPROIC ACID LEVEL: Valproic Acid Lvl: <10 ug/mL — ABNORMAL LOW (ref 50.0–100.0)

## 2011-11-04 LAB — EXPECTORATED SPUTUM ASSESSMENT W REFEX TO RESP CULTURE: Special Requests: NORMAL

## 2011-11-04 LAB — TSH: TSH: 1.216 u[IU]/mL (ref 0.350–4.500)

## 2011-11-04 LAB — EXPECTORATED SPUTUM ASSESSMENT W GRAM STAIN, RFLX TO RESP C

## 2011-11-04 MED ORDER — SODIUM CHLORIDE 0.9 % IJ SOLN
3.0000 mL | Freq: Two times a day (BID) | INTRAMUSCULAR | Status: DC
  Administered 2011-11-04 – 2011-11-05 (×3): 3 mL via INTRAVENOUS

## 2011-11-04 MED ORDER — ACETAMINOPHEN 325 MG PO TABS
650.0000 mg | ORAL_TABLET | Freq: Four times a day (QID) | ORAL | Status: DC | PRN
  Administered 2011-11-04 – 2011-11-05 (×3): 650 mg via ORAL
  Filled 2011-11-04 (×3): qty 2

## 2011-11-04 MED ORDER — ALBUTEROL SULFATE (5 MG/ML) 0.5% IN NEBU: 2.5000 mg | INHALATION_SOLUTION | RESPIRATORY_TRACT | Status: DC

## 2011-11-04 MED ORDER — CLONAZEPAM 1 MG PO TABS: 1.0000 mg | ORAL_TABLET | Freq: Two times a day (BID) | ORAL | Status: DC | PRN

## 2011-11-04 MED ORDER — LORAZEPAM 1 MG PO TABS
1.0000 mg | ORAL_TABLET | Freq: Four times a day (QID) | ORAL | Status: DC | PRN
  Administered 2011-11-05 – 2011-11-06 (×4): 1 mg via ORAL
  Filled 2011-11-04 (×4): qty 1

## 2011-11-04 MED ORDER — IPRATROPIUM BROMIDE 0.02 % IN SOLN: 0.5000 mg | Freq: Four times a day (QID) | RESPIRATORY_TRACT | Status: DC

## 2011-11-04 MED ORDER — METFORMIN HCL 500 MG PO TABS
1000.0000 mg | ORAL_TABLET | Freq: Every day | ORAL | Status: DC
  Administered 2011-11-04 – 2011-11-06 (×3): 1000 mg via ORAL
  Filled 2011-11-04 (×4): qty 2

## 2011-11-04 MED ORDER — SITAGLIP PHOS-METFORMIN HCL ER 50-1000 MG PO TB24: 1.0000 | ORAL_TABLET | Freq: Every day | ORAL | Status: DC

## 2011-11-04 MED ORDER — ONDANSETRON HCL 4 MG PO TABS: 4.0000 mg | ORAL_TABLET | Freq: Four times a day (QID) | ORAL | Status: DC | PRN

## 2011-11-04 MED ORDER — SODIUM CHLORIDE 0.9 % IV SOLN
INTRAVENOUS | Status: DC
  Administered 2011-11-04: 100 mL via INTRAVENOUS
  Administered 2011-11-06: 1000 mL via INTRAVENOUS

## 2011-11-04 MED ORDER — LINAGLIPTIN 5 MG PO TABS
5.0000 mg | ORAL_TABLET | Freq: Every day | ORAL | Status: DC
  Administered 2011-11-04 – 2011-11-06 (×3): 5 mg via ORAL
  Filled 2011-11-04 (×4): qty 1

## 2011-11-04 MED ORDER — THIAMINE HCL 100 MG/ML IJ SOLN
100.0000 mg | Freq: Every day | INTRAMUSCULAR | Status: DC
  Filled 2011-11-04 (×2): qty 1

## 2011-11-04 MED ORDER — ONDANSETRON HCL 4 MG/2ML IJ SOLN: 4.0000 mg | Freq: Four times a day (QID) | INTRAMUSCULAR | Status: DC | PRN

## 2011-11-04 MED ORDER — IPRATROPIUM BROMIDE 0.02 % IN SOLN: 0.5000 mg | RESPIRATORY_TRACT | Status: DC

## 2011-11-04 MED ORDER — ACETAMINOPHEN 650 MG RE SUPP
650.0000 mg | Freq: Four times a day (QID) | RECTAL | Status: DC | PRN
  Filled 2011-11-04: qty 2

## 2011-11-04 MED ORDER — ALBUTEROL SULFATE (5 MG/ML) 0.5% IN NEBU
2.5000 mg | INHALATION_SOLUTION | Freq: Four times a day (QID) | RESPIRATORY_TRACT | Status: DC
  Administered 2011-11-04 – 2011-11-06 (×7): 2.5 mg via RESPIRATORY_TRACT
  Filled 2011-11-04 (×8): qty 0.5

## 2011-11-04 MED ORDER — ALBUTEROL SULFATE (5 MG/ML) 0.5% IN NEBU
2.5000 mg | INHALATION_SOLUTION | RESPIRATORY_TRACT | Status: DC
  Administered 2011-11-04: 2.5 mg via RESPIRATORY_TRACT
  Filled 2011-11-04: qty 0.5

## 2011-11-04 MED ORDER — HYDROCODONE-ACETAMINOPHEN 5-325 MG PO TABS: 1.0000 | ORAL_TABLET | ORAL | Status: DC | PRN

## 2011-11-04 MED ORDER — FLUOXETINE HCL 20 MG PO TABS: 20.0000 mg | ORAL_TABLET | Freq: Every day | ORAL | Status: DC

## 2011-11-04 MED ORDER — IPRATROPIUM BROMIDE 0.02 % IN SOLN
0.5000 mg | Freq: Four times a day (QID) | RESPIRATORY_TRACT | Status: DC
  Administered 2011-11-04 – 2011-11-06 (×7): 0.5 mg via RESPIRATORY_TRACT
  Filled 2011-11-04 (×8): qty 2.5

## 2011-11-04 MED ORDER — VITAMIN B-1 100 MG PO TABS
100.0000 mg | ORAL_TABLET | Freq: Every day | ORAL | Status: DC
  Administered 2011-11-04 – 2011-11-06 (×3): 100 mg via ORAL
  Filled 2011-11-04 (×3): qty 1

## 2011-11-04 MED ORDER — DEXTROSE 5 % IV SOLN
500.0000 mg | INTRAVENOUS | Status: DC
  Administered 2011-11-04 – 2011-11-05 (×2): 500 mg via INTRAVENOUS
  Filled 2011-11-04 (×4): qty 500

## 2011-11-04 MED ORDER — DIVALPROEX SODIUM 500 MG PO DR TAB
1000.0000 mg | DELAYED_RELEASE_TABLET | Freq: Every day | ORAL | Status: DC
  Administered 2011-11-04 – 2011-11-05 (×2): 1000 mg via ORAL
  Filled 2011-11-04 (×4): qty 2

## 2011-11-04 MED ORDER — FLUOXETINE HCL 20 MG PO CAPS
20.0000 mg | ORAL_CAPSULE | Freq: Every day | ORAL | Status: DC
  Administered 2011-11-04 – 2011-11-06 (×3): 20 mg via ORAL
  Filled 2011-11-04 (×3): qty 1

## 2011-11-04 MED ORDER — IPRATROPIUM BROMIDE 0.02 % IN SOLN
0.5000 mg | RESPIRATORY_TRACT | Status: DC
  Administered 2011-11-04: 0.5 mg via RESPIRATORY_TRACT
  Filled 2011-11-04: qty 2.5

## 2011-11-04 MED ORDER — FOLIC ACID 1 MG PO TABS
1.0000 mg | ORAL_TABLET | Freq: Every day | ORAL | Status: DC
  Administered 2011-11-04 – 2011-11-06 (×3): 1 mg via ORAL
  Filled 2011-11-04 (×3): qty 1

## 2011-11-04 MED ORDER — ADULT MULTIVITAMIN W/MINERALS CH
1.0000 | ORAL_TABLET | Freq: Every day | ORAL | Status: DC
  Administered 2011-11-04 – 2011-11-06 (×3): 1 via ORAL
  Filled 2011-11-04 (×3): qty 1

## 2011-11-04 MED ORDER — DEXTROSE 5 % IV SOLN
1.0000 g | INTRAVENOUS | Status: DC
  Administered 2011-11-04 – 2011-11-05 (×2): 1 g via INTRAVENOUS
  Filled 2011-11-04 (×4): qty 10

## 2011-11-04 MED ORDER — INSULIN ASPART 100 UNIT/ML ~~LOC~~ SOLN
0.0000 [IU] | Freq: Three times a day (TID) | SUBCUTANEOUS | Status: DC
  Administered 2011-11-04 – 2011-11-05 (×2): 3 [IU] via SUBCUTANEOUS
  Filled 2011-11-04: qty 3

## 2011-11-04 MED ORDER — LORAZEPAM 2 MG/ML IJ SOLN: 1.0000 mg | Freq: Four times a day (QID) | INTRAMUSCULAR | Status: DC | PRN

## 2011-11-04 NOTE — Progress Notes (Signed)
Checked with pt about CPAP and he states he does not wear at home and does not want to wear while in the hospital. RN aware.

## 2011-11-04 NOTE — H&P (Signed)
PCP:   Nadara Mustard, MD, MD   Chief Complaint: Shortness of breath and cough   HPI: Joseph [name] is an 36 y.o. male history of schizophrenia, hyperlipidemia, Lupus anticoagulant positive, sleep apnea, noncompliant with CPAP use, compartment syndrome while on statin, presents to [facility] complaining of shortness of breath and blood-tinged sputum productive cough for 2 days. He denied any chest pain, nausea, vomiting, but admitted to having fever. He has no diarrhea, myalgia, distant travel or any ill contact. Evaluation in the emergency room included a white count of 14,000, hemoglobin of 12.3, chest x-ray with retrocardiac infiltrate, and CT pulmonary angiogram showed no PE, confirmed multifocal infiltrates. He was found to be hypoxemic by pulse oximetry, and supplemental O2 was given. Hospitalist was asked to admit patient for pneumonia.  Rewiew of Systems:  The patient denies anorexia, fever, weight loss,, vision loss, decreased hearing, hoarseness, chest pain, syncope,  peripheral edema, balance deficits, abdominal pain, melena, hematochezia, severe indigestion/heartburn, hematuria, incontinence, genital sores, muscle weakness, suspicious skin lesions, transient blindness, difficulty walking, depression, unusual weight change, abnormal bleeding, enlarged lymph nodes, angioedema, and breast masses.   Past Medical History  Diagnosis Date  . Anemia     post operative  . Hyperlipidemia   . Schizophrenia   . Anxiety   . Obesity   . Cellulitis     bilateral thighs  . Lupus anticoagulant positive 08/15/2011  . Antiphospholipid syndrome 08/15/2011  . Compartment syndrome     Past Surgical History  Procedure Date  . I&d extremity     left leg  . Tonsillectomy   . Wisdom tooth extraction   . Leg surgery     Medications:  HOME MEDS: Prior to Admission medications   Medication Sig Start Date End Date Taking? Authorizing Provider  clonazePAM (KLONOPIN) 1 MG tablet Take  1 mg by mouth 2 (two) times daily as needed. For anxiety   Yes Historical Provider, MD  divalproex (DEPAKOTE) 500 MG EC tablet Take 1,000 mg by mouth at bedtime.     Yes Historical Provider, MD  FLUoxetine (PROZAC) 20 MG tablet Take 20 mg by mouth daily.     Yes Historical Provider, MD  HYDROcodone-acetaminophen (VICODIN) 5-500 MG per tablet Take 1 tablet by mouth every 6 (six) hours as needed. For pain    Yes Historical Provider, MD  Multiple Vitamin (MULTIVITAMIN) capsule Take 1 capsule by mouth daily.     Yes Historical Provider, MD  pravastatin (PRAVACHOL) 40 MG tablet Take 40 mg by mouth daily.     Yes Historical Provider, MD  SitaGLIPtin-MetFORMIN HCl (JANUMET XR) 50-1000 MG TB24 Take 1 tablet by mouth daily.     Yes Historical Provider, MD     Allergies:  Allergies  Allergen Reactions  . Haldol (Haloperidol Decanoate)     Painful     Social History:   reports that he has never smoked. He has never used smokeless tobacco. He reports that he drinks alcohol. He reports that he does not use illicit drugs.  Family History: Family History  Problem Relation Age of Onset  . GER disease Father   . Diverticulosis Father   . Colon cancer Neg Hx      Physical Exam: Filed Vitals:   11/03/11 1705 11/03/11 1945 11/03/11 2246 11/04/11 0512  BP:  123/71 109/76 131/74  Pulse:  107 95 105  Temp:  98.9 F (37.2 C) 98.8 F (37.1 C) 98.3 F (36.8 C)  TempSrc:  Oral Oral Oral  Resp:  28 24 22   Height:   6\' 2"  (1.88 m)   Weight:   121.4 kg (267 lb 10.2 oz)   SpO2: 94% 92% 95% 97%   Blood pressure 131/74, pulse 105, temperature 98.3 F (36.8 C), temperature source Oral, resp. rate 22, height 6\' 2"  (1.88 m), weight 121.4 kg (267 lb 10.2 oz), SpO2 97.00%.  GEN:   person lying in the stretcher in no acute distress; cooperative with exam, having somewhat flat affect PSYCH:  alert and oriented x4; does not appear anxious does not appear depressed;  HEENT: Mucous membranes pink and  anicteric; PERRLA; EOM intact; no cervical lymphadenopathy nor thyromegaly or carotid bruit; no JVD; Breasts:: Not examined CHEST WALL: No tenderness CHEST: Normal respiration, clear to auscultation bilaterally, no wheezes. HEART: Regular rate and rhythm; no murmurs rubs or gallops BACK: No kyphosis or scoliosis; no CVA tenderness ABDOMEN: Obese, soft non-tender; no masses, no organomegaly, normal abdominal bowel sounds; no pannus; no intertriginous candida. Rectal Exam: Not done EXTREMITIES: No bone or joint deformity; age-appropriate arthropathy of the hands and knees; trace edema; no ulcerations. Bilateral lower extremity surgical scar well healed Genitalia: not examined PULSES: 2+ and symmetric SKIN: Normal hydration no rash or ulceration. There is no central or peripheral cyanosis CNS: Cranial nerves 2-12 grossly intact no focal neurologic deficit   Labs & Imaging Results for orders placed during the hospital encounter of 11/03/11 (from the past 48 hour(s))  GLUCOSE, CAPILLARY     Status: Abnormal   Collection Time   11/03/11  4:24 PM      Component Value Range Comment   Glucose-Capillary 115 (*) 70 - 99 (mg/dL)   CBC     Status: Abnormal   Collection Time   11/03/11  6:33 PM      Component Value Range Comment   WBC 14.6 (*) 4.0 - 10.5 (K/uL)    RBC 4.26  4.22 - 5.81 (MIL/uL)    Hemoglobin 12.3 (*) 13.0 - 17.0 (g/dL)    HCT 16.1 (*) 09.6 - 52.0 (%)    MCV 85.7  78.0 - 100.0 (fL)    MCH 28.9  26.0 - 34.0 (pg)    MCHC 33.7  30.0 - 36.0 (g/dL)    RDW 04.5 (*) 40.9 - 15.5 (%)    Platelets 187  150 - 400 (K/uL)   DIFFERENTIAL     Status: Abnormal   Collection Time   11/03/11  6:33 PM      Component Value Range Comment   Neutrophils Relative 78 (*) 43 - 77 (%)    Neutro Abs 11.4 (*) 1.7 - 7.7 (K/uL)    Lymphocytes Relative 13  12 - 46 (%)    Lymphs Abs 1.8  0.7 - 4.0 (K/uL)    Monocytes Relative 9  3 - 12 (%)    Monocytes Absolute 1.3 (*) 0.1 - 1.0 (K/uL)    Eosinophils Relative  1  0 - 5 (%)    Eosinophils Absolute 0.1  0.0 - 0.7 (K/uL)    Basophils Relative 0  0 - 1 (%)    Basophils Absolute 0.0  0.0 - 0.1 (K/uL)   COMPREHENSIVE METABOLIC PANEL     Status: Abnormal   Collection Time   11/03/11  6:33 PM      Component Value Range Comment   Sodium 138  135 - 145 (mEq/L)    Potassium 3.7  3.5 - 5.1 (mEq/L)    Chloride 100  96 - 112 (mEq/L)  CO2 27  19 - 32 (mEq/L)    Glucose, Bld 104 (*) 70 - 99 (mg/dL)    BUN 12  6 - 23 (mg/dL)    Creatinine, Ser 0.10  0.50 - 1.35 (mg/dL)    Calcium 8.2 (*) 8.4 - 10.5 (mg/dL)    Total Protein 6.5  6.0 - 8.3 (g/dL)    Albumin 3.4 (*) 3.5 - 5.2 (g/dL)    AST 42 (*) 0 - 37 (U/L)    ALT 73 (*) 0 - 53 (U/L)    Alkaline Phosphatase 74  39 - 117 (U/L)    Total Bilirubin 0.2 (*) 0.3 - 1.2 (mg/dL)    GFR calc non Af Amer >90  >90 (mL/min)    GFR calc Af Amer >90  >90 (mL/min)   GLUCOSE, CAPILLARY     Status: Normal   Collection Time   11/03/11 10:45 PM      Component Value Range Comment   Glucose-Capillary 99  70 - 99 (mg/dL)   VALPROIC ACID LEVEL     Status: Abnormal   Collection Time   11/04/11  1:36 AM      Component Value Range Comment   Valproic Acid Lvl <10.0 (*) 50.0 - 100.0 (ug/mL)    Dg Chest 2 View  11/03/2011  *RADIOLOGY REPORT*  Clinical Data: Shortness of breath  CHEST - 2 VIEW  Comparison: 07/31/2011  Findings: Retrocardiac/left lower lobe opacity, suspicious for pneumonia.  Right lung essentially clear. No pleural effusion or pneumothorax.  Cardiomediastinal silhouette is within normal limits.  Visualized osseous structures are within normal limits.  IMPRESSION: Retrocardiac/left lower lobe opacity, suspicious for pneumonia.  Original Report Authenticated By: Charline Bills, M.D.   Ct Angio Chest W/cm &/or Wo Cm  11/03/2011  *RADIOLOGY REPORT*  Clinical Data: Shortness of breath, difficulty breathing, evaluate for PE  CT ANGIOGRAPHY CHEST  Technique:  Multidetector CT imaging of the chest using the standard protocol  during bolus administration of intravenous contrast. Multiplanar reconstructed images including MIPs were obtained and reviewed to evaluate the vascular anatomy.  Contrast:  100 ml Omnipaque-300 IV  Comparison: Chest radiographs dated 11/03/2011.  Findings: No evidence of pulmonary embolism.  Multifocal patchy opacities, most prominent in the left lower lobe but also present in the right middle lobe, lingula, and medial right lower lobe, compatible with multifocal pneumonia.  No pleural effusion or pneumothorax.  Visualized thyroid is unremarkable.  The heart is normal in size.  No pericardial effusion.  Small subcentimeter mediastinal/right hilar lymph nodes, likely reactive.  Visualized upper abdomen is unremarkable.  Visualized osseous structures are within normal limits.  IMPRESSION: No evidence of pulmonary embolism.  Multifocal patchy opacities, most prominent in the left lower lobe, compatible with multifocal pneumonia.  Original Report Authenticated By: Charline Bills, M.D.      Assessment Present on Admission:  .Antiphospholipid syndrome Community-acquired pneumonia Compartments syndrome, question related to statin Schizophrenia  PLAN: Will admit him to telemetry, give supplemental O2, treat HCAP with IV Rocephin and Zithromax. I will continue his other medications. He is very stable, full code, and will be admitted to triad hospitalist service.  Other plans as per orders.    Sylina Henion 11/04/2011, 5:16 AM

## 2011-11-04 NOTE — Progress Notes (Signed)
Subjective: Patient admitted last night- with SOB and productive cough, fever Still SOB and coughing  Objective: Vital signs in last 24 hours: Filed Vitals:   11/03/11 1705 11/03/11 1945 11/03/11 2246 11/04/11 0512  BP:  123/71 109/76 131/74  Pulse:  107 95 105  Temp:  98.9 F (37.2 C) 98.8 F (37.1 C) 98.3 F (36.8 C)  TempSrc:  Oral Oral Oral  Resp:  28 24 22   Height:   6\' 2"  (1.88 m)   Weight:   121.4 kg (267 lb 10.2 oz)   SpO2: 94% 92% 95% 97%   Weight change:   Intake/Output Summary (Last 24 hours) at 11/04/11 0933 Last data filed at 11/04/11 1610  Gross per 24 hour  Intake    940 ml  Output   1200 ml  Net   -260 ml    Physical Exam: General: Awake, Oriented, No acute distress. HEENT: EOMI. Neck: Supple, No JVD CV: S1 and S2, RRR Lungs: coarse B/S, occ wheezing Abdomen: Soft, Nontender, Nondistended, +bowel sounds, obese Ext: Good pulses. Trace edema.   Lab Results:  [facility] 11/04/11 0630 11/03/11 1833  NA 139 138  K 3.8 3.7  CL 103 100  CO2 30 27  GLUCOSE 109* 104*  BUN 11 12  CREATININE 0.94 0.90  CALCIUM 8.4 8.2*  MG -- --  PHOS -- --    Basename 11/03/11 1833  AST 42*  ALT 73*  ALKPHOS 74  BILITOT 0.2*  PROT 6.5  ALBUMIN 3.4*     Basename 11/04/11 0630 11/03/11 1833  WBC 10.0 14.6*  NEUTROABS -- 11.4*  HGB 11.5* 12.3*  HCT 35.9* 36.5*  MCV 88.6 85.7  PLT 172 187    Micro Results: No results found for this or any previous visit (from the past 240 hour(s)).  Studies/Results: Dg Chest 2 View  11/03/2011  *RADIOLOGY REPORT*  Clinical Data: Shortness of breath  CHEST - 2 VIEW  Comparison: 07/31/2011  Findings: Retrocardiac/left lower lobe opacity, suspicious for pneumonia.  Right lung essentially clear. No pleural effusion or pneumothorax.  Cardiomediastinal silhouette is within normal limits.  Visualized osseous structures are within normal limits.  IMPRESSION: Retrocardiac/left lower lobe opacity, suspicious for pneumonia.  Original  Report Authenticated By: Charline Bills, M.D.   Ct Angio Chest W/cm &/or Wo Cm  11/03/2011  *RADIOLOGY REPORT*  Clinical Data: Shortness of breath, difficulty breathing, evaluate for PE  CT ANGIOGRAPHY CHEST  Technique:  Multidetector CT imaging of the chest using the standard protocol during bolus administration of intravenous contrast. Multiplanar reconstructed images including MIPs were obtained and reviewed to evaluate the vascular anatomy.  Contrast:  100 ml Omnipaque-300 IV  Comparison: Chest radiographs dated 11/03/2011.  Findings: No evidence of pulmonary embolism.  Multifocal patchy opacities, most prominent in the left lower lobe but also present in the right middle lobe, lingula, and medial right lower lobe, compatible with multifocal pneumonia.  No pleural effusion or pneumothorax.  Visualized thyroid is unremarkable.  The heart is normal in size.  No pericardial effusion.  Small subcentimeter mediastinal/right hilar lymph nodes, likely reactive.  Visualized upper abdomen is unremarkable.  Visualized osseous structures are within normal limits.  IMPRESSION: No evidence of pulmonary embolism.  Multifocal patchy opacities, most prominent in the left lower lobe, compatible with multifocal pneumonia.  Original Report Authenticated By: Charline Bills, M.D.    Medications: I have reviewed the patient's current medications. Scheduled Meds:   . acetaminophen  1,000 mg Oral Once  . ipratropium  0.5 mg  Nebulization Q4H   And  . albuterol  2.5 mg Nebulization Q4H  . albuterol  5 mg Nebulization Once  . antiseptic oral rinse  15 mL Mouth Rinse BID  . azithromycin  500 mg Intravenous Once  . azithromycin  500 mg Intravenous Q24H  . cefTRIAXone (ROCEPHIN)  IV  1 g Intravenous Once  . cefTRIAXone (ROCEPHIN)  IV  1 g Intravenous Q24H  . divalproex  1,000 mg Oral QHS  . FLUoxetine  20 mg Oral Daily  . folic acid  1 mg Oral Daily  . insulin aspart  0-15 Units Subcutaneous TID WC  . linagliptin  5  mg Oral Q breakfast  . metFORMIN  1,000 mg Oral Q breakfast  . mulitivitamin with minerals  1 tablet Oral Daily  . pneumococcal 23 valent vaccine  0.5 mL Intramuscular Tomorrow-1000  . sodium chloride  3 mL Intravenous Q12H  . thiamine  100 mg Oral Daily   Or  . thiamine  100 mg Intravenous Daily  . DISCONTD: FLUoxetine  20 mg Oral Daily  . DISCONTD: SitaGLIPtin-MetFORMIN HCl  1 tablet Oral Daily   Continuous Infusions:   . sodium chloride 100 mL (11/04/11 0815)  . DISCONTD: sodium chloride 100 mL/hr at 11/04/11 0553   PRN Meds:.acetaminophen, acetaminophen, clonazePAM, HYDROcodone-acetaminophen, iohexol, LORazepam, LORazepam, ondansetron (ZOFRAN) IV, ondansetron  Assessment/Plan:   1. Pneumonia- azithromycin/rocephin, nebs  2. Sleep apnea- non compliant 3. Alcohol abuse- CIWA, encourage cessation 4. Schizophrenia- depakote 5. Leukocytosis- resolved 6. DM- metformin, SSI     LOS: 1 day  Rubel Heckard, DO 11/04/2011, 9:33 AM

## 2011-11-04 NOTE — Progress Notes (Signed)
11/04/11 Nursing 0107 Patient has order for cpap at [facility].  Patient states that he doesn't use a cpap at home anymore. Patient currently on 3L of O2.  Patient refusing cpap at this time. Notified Dr. Conley Rolls. No new orders given. Will continue to monitor patient.     Nelda Marseille, RN

## 2011-11-04 NOTE — Progress Notes (Signed)
Placed patient on CPAP at 10cm for the night. Sp02=97% will continue to monitor patient

## 2011-11-05 LAB — BASIC METABOLIC PANEL
BUN: 9 mg/dL (ref 6–23)
CO2: 26 mEq/L (ref 19–32)
Calcium: 9 mg/dL (ref 8.4–10.5)
Chloride: 103 mEq/L (ref 96–112)
Creatinine, Ser: 0.84 mg/dL (ref 0.50–1.35)
GFR calc Af Amer: >90 mL/min (ref >90)
GFR calc non Af Amer: >90 mL/min (ref >90)
Glucose, Bld: 93 mg/dL (ref 70–99)
Potassium: 3.9 mEq/L (ref 3.5–5.1)
Sodium: 139 mEq/L (ref 135–145)

## 2011-11-05 LAB — CBC
HCT: 36.6 % — ABNORMAL LOW (ref 39.0–52.0)
Hemoglobin: 11.9 g/dL — ABNORMAL LOW (ref 13.0–17.0)
MCH: 28.5 pg (ref 26.0–34.0)
MCHC: 32.5 g/dL (ref 30.0–36.0)
MCV: 87.8 fL (ref 78.0–100.0)
Platelets: 171 10*3/uL (ref 150–400)
RBC: 4.17 MIL/uL — ABNORMAL LOW (ref 4.22–5.81)
RDW: 18.6 % — ABNORMAL HIGH (ref 11.5–15.5)
WBC: 6.8 10*3/uL (ref 4.0–10.5)

## 2011-11-05 LAB — GLUCOSE, CAPILLARY
Glucose-Capillary: 100 mg/dL — ABNORMAL HIGH (ref 70–99)
Glucose-Capillary: 118 mg/dL — ABNORMAL HIGH (ref 70–99)
Glucose-Capillary: 155 mg/dL — ABNORMAL HIGH (ref 70–99)
Glucose-Capillary: 118 mg/dL — ABNORMAL HIGH (ref 70–99)

## 2011-11-05 MED ORDER — ENOXAPARIN SODIUM 40 MG/0.4ML ~~LOC~~ SOLN
40.0000 mg | SUBCUTANEOUS | Status: DC
  Administered 2011-11-05 – 2011-11-06 (×2): 40 mg via SUBCUTANEOUS
  Filled 2011-11-05 (×2): qty 0.4

## 2011-11-05 NOTE — Progress Notes (Signed)
Subjective: Patient feeling better, cough decreasing, no fever no chills Family at bedside  Objective: Vital signs in last 24 hours: Filed Vitals:   11/04/11 2129 11/05/11 0528 11/05/11 0829 11/05/11 0845  BP: 127/70 128/77    Pulse: 89 84  113  Temp: 98.5 F (36.9 C) 98.4 F (36.9 C)    TempSrc: Oral Oral    Resp: 18 17    Height:      Weight:      SpO2: 99% 93% 94%    Weight change:   Intake/Output Summary (Last 24 hours) at 11/05/11 1038 Last data filed at 11/05/11 1013  Gross per 24 hour  Intake   1675 ml  Output   4050 ml  Net  -2375 ml    Physical Exam: General: Awake, Oriented, No acute distress. HEENT: EOMI. Neck: Supple, No JVD CV: S1 and S2, RRR Lungs: clear B/L no wheezing Abdomen: Soft, Nontender, Nondistended, +bowel sounds, obese Ext: Good pulses. Trace edema., mild tremor   Lab Results:  Basename 11/05/11 0505 11/04/11 0630  NA 139 139  K 3.9 3.8  CL 103 103  CO2 26 30  GLUCOSE 93 109*  BUN 9 11  CREATININE 0.84 0.94  CALCIUM 9.0 8.4  MG -- --  PHOS -- --    Basename 11/03/11 1833  AST 42*  ALT 73*  ALKPHOS 74  BILITOT 0.2*  PROT 6.5  ALBUMIN 3.4*     Basename 11/05/11 0505 11/04/11 0630 11/03/11 1833  WBC 6.8 10.0 --  NEUTROABS -- -- 11.4*  HGB 11.9* 11.5* --  HCT 36.6* 35.9* --  MCV 87.8 88.6 --  PLT 171 172 --    Micro Results: Recent Results (from the past 240 hour(s))  CULTURE, BLOOD (ROUTINE X 2)     Status: Normal (Preliminary result)   Collection Time   11/03/11  8:20 PM      Component Value Range Status Comment   Specimen Description BLOOD LEFT ANTECUBITAL   Final    Special Requests NONE BOTTLES DRAWN AEROBIC AND ANAEROBIC  10 CC   Final    Culture  Setup Time [id number]   Final    Culture     Final    Value:        BLOOD CULTURE RECEIVED NO GROWTH TO DATE CULTURE WILL BE HELD FOR 5 DAYS BEFORE ISSUING A FINAL NEGATIVE REPORT   Report Status PENDING   Incomplete   CULTURE, BLOOD (ROUTINE X 2)     Status:  Normal (Preliminary result)   Collection Time   11/03/11  8:30 PM      Component Value Range Status Comment   Specimen Description BLOOD RIGHT ARM   Final    Special Requests NONE BOTTLES DRAWN AEROBIC AND ANAEROBIC 10 CC   Final    Culture  Setup Time [id number]   Final    Culture     Final    Value:        BLOOD CULTURE RECEIVED NO GROWTH TO DATE CULTURE WILL BE HELD FOR 5 DAYS BEFORE ISSUING A FINAL NEGATIVE REPORT   Report Status PENDING   Incomplete   CULTURE, SPUTUM-ASSESSMENT     Status: Normal   Collection Time   11/04/11 12:26 PM      Component Value Range Status Comment   Specimen Description SPUTUM   Final    Special Requests Normal   Final    Sputum evaluation     Final    Value: THIS SPECIMEN  IS ACCEPTABLE. RESPIRATORY CULTURE REPORT TO FOLLOW.   Report Status 11/04/2011 FINAL   Final     Studies/Results: Dg Chest 2 View  11/03/2011  *RADIOLOGY REPORT*  Clinical Data: Shortness of breath  CHEST - 2 VIEW  Comparison: 07/31/2011  Findings: Retrocardiac/left lower lobe opacity, suspicious for pneumonia.  Right lung essentially clear. No pleural effusion or pneumothorax.  Cardiomediastinal silhouette is within normal limits.  Visualized osseous structures are within normal limits.  IMPRESSION: Retrocardiac/left lower lobe opacity, suspicious for pneumonia.  Original Report Authenticated By: Charline Bills, M.D.   Ct Angio Chest W/cm &/or Wo Cm  11/03/2011  *RADIOLOGY REPORT*  Clinical Data: Shortness of breath, difficulty breathing, evaluate for PE  CT ANGIOGRAPHY CHEST  Technique:  Multidetector CT imaging of the chest using the standard protocol during bolus administration of intravenous contrast. Multiplanar reconstructed images including MIPs were obtained and reviewed to evaluate the vascular anatomy.  Contrast:  100 ml Omnipaque-300 IV  Comparison: Chest radiographs dated 11/03/2011.  Findings: No evidence of pulmonary embolism.  Multifocal patchy opacities, most prominent in  the left lower lobe but also present in the right middle lobe, lingula, and medial right lower lobe, compatible with multifocal pneumonia.  No pleural effusion or pneumothorax.  Visualized thyroid is unremarkable.  The heart is normal in size.  No pericardial effusion.  Small subcentimeter mediastinal/right hilar lymph nodes, likely reactive.  Visualized upper abdomen is unremarkable.  Visualized osseous structures are within normal limits.  IMPRESSION: No evidence of pulmonary embolism.  Multifocal patchy opacities, most prominent in the left lower lobe, compatible with multifocal pneumonia.  Original Report Authenticated By: Charline Bills, M.D.    Medications: I have reviewed the patient's current medications. Scheduled Meds:    . albuterol  2.5 mg Nebulization QID   And  . ipratropium  0.5 mg Nebulization QID  . antiseptic oral rinse  15 mL Mouth Rinse BID  . azithromycin  500 mg Intravenous Q24H  . cefTRIAXone (ROCEPHIN)  IV  1 g Intravenous Q24H  . divalproex  1,000 mg Oral QHS  . enoxaparin (LOVENOX) injection  40 mg Subcutaneous Q24H  . FLUoxetine  20 mg Oral Daily  . folic acid  1 mg Oral Daily  . insulin aspart  0-15 Units Subcutaneous TID WC  . linagliptin  5 mg Oral Q breakfast  . metFORMIN  1,000 mg Oral Q breakfast  . mulitivitamin with minerals  1 tablet Oral Daily  . pneumococcal 23 valent vaccine  0.5 mL Intramuscular Tomorrow-1000  . sodium chloride  3 mL Intravenous Q12H  . thiamine  100 mg Oral Daily   Or  . thiamine  100 mg Intravenous Daily  . DISCONTD: albuterol  2.5 mg Nebulization Q4H  . DISCONTD: albuterol  2.5 mg Nebulization Q4H  . DISCONTD: ipratropium  0.5 mg Nebulization Q4H  . DISCONTD: ipratropium  0.5 mg Nebulization QID   Continuous Infusions:    . sodium chloride 100 mL (11/04/11 0815)   PRN Meds:.acetaminophen, acetaminophen, clonazePAM, HYDROcodone-acetaminophen, LORazepam, LORazepam, ondansetron (ZOFRAN) IV,  ondansetron  Assessment/Plan:   1. Pneumonia- azithromycin/rocephin, nebs, change to PO tomm  2. Sleep apnea- non compliant- wore briefly last night 3. Alcohol abuse- CIWA, encourage cessation, mild tremor, continue to monitor 4. Schizophrenia- depakote 5. Leukocytosis- resolved 6. DM- metformin, SSI     LOS: 2 days  Kayde Atkerson, DO 11/05/2011, 10:38 AM

## 2011-11-06 ENCOUNTER — Ambulatory Visit: Payer: Medicare Other | Admitting: Internal Medicine

## 2011-11-06 LAB — GLUCOSE, CAPILLARY
Glucose-Capillary: 102 mg/dL — ABNORMAL HIGH (ref 70–99)
Glucose-Capillary: 92 mg/dL (ref 70–99)

## 2011-11-06 MED ORDER — LEVOFLOXACIN 500 MG PO TABS: 500.0000 mg | ORAL_TABLET | Freq: Every day | ORAL | Status: AC

## 2011-11-06 MED ORDER — LEVOFLOXACIN 500 MG PO TABS
500.0000 mg | ORAL_TABLET | Freq: Every day | ORAL | Status: DC
  Administered 2011-11-06: 500 mg via ORAL
  Filled 2011-11-06: qty 1

## 2011-11-06 MED ORDER — FOLIC ACID 1 MG PO TABS: 1.0000 mg | ORAL_TABLET | Freq: Every day | ORAL | Status: DC

## 2011-11-06 MED ORDER — THIAMINE HCL 100 MG PO TABS: 100.0000 mg | ORAL_TABLET | Freq: Every day | ORAL | Status: DC

## 2011-11-06 NOTE — Progress Notes (Signed)
Subjective: Patient just out of shower, feeling great  Objective: Vital signs in last 24 hours: Filed Vitals:   11/05/11 2247 11/06/11 0055 11/06/11 0533 11/06/11 1100  BP: 131/79 120/70 125/76   Pulse: 102 104 82   Temp: 98.1 F (36.7 C)  97.7 F (36.5 C)   TempSrc: Oral  Oral   Resp: 18  20   Height:      Weight:      SpO2: 97%  97% 95%   Weight change:   Intake/Output Summary (Last 24 hours) at 11/06/11 1351 Last data filed at 11/06/11 1212  Gross per 24 hour  Intake 6131.67 ml  Output   1100 ml  Net 5031.67 ml    Physical Exam: General: Awake, Oriented, No acute distress. HEENT: EOMI. Neck: Supple, No JVD CV: S1 and S2, RRR Lungs: clear B/L no wheezing Abdomen: Soft, Nontender, Nondistended, +bowel sounds, obese Ext: Good pulses. Trace edema., mild tremor   Lab Results:  Basename 11/05/11 0505 11/04/11 0630  NA 139 139  K 3.9 3.8  CL 103 103  CO2 26 30  GLUCOSE 93 109*  BUN 9 11  CREATININE 0.84 0.94  CALCIUM 9.0 8.4  MG -- --  PHOS -- --    Basename 11/03/11 1833  AST 42*  ALT 73*  ALKPHOS 74  BILITOT 0.2*  PROT 6.5  ALBUMIN 3.4*     Basename 11/05/11 0505 11/04/11 0630 11/03/11 1833  WBC 6.8 10.0 --  NEUTROABS -- -- 11.4*  HGB 11.9* 11.5* --  HCT 36.6* 35.9* --  MCV 87.8 88.6 --  PLT 171 172 --    Micro Results: Recent Results (from the past 240 hour(s))  CULTURE, BLOOD (ROUTINE X 2)     Status: Normal (Preliminary result)   Collection Time   11/03/11  8:20 PM      Component Value Range Status Comment   Specimen Description BLOOD LEFT ANTECUBITAL   Final    Special Requests NONE BOTTLES DRAWN AEROBIC AND ANAEROBIC  10 CC   Final    Culture  Setup Time [id number]   Final    Culture     Final    Value:        BLOOD CULTURE RECEIVED NO GROWTH TO DATE CULTURE WILL BE HELD FOR 5 DAYS BEFORE ISSUING A FINAL NEGATIVE REPORT   Report Status PENDING   Incomplete   CULTURE, BLOOD (ROUTINE X 2)     Status: Normal (Preliminary result)   Collection Time   11/03/11  8:30 PM      Component Value Range Status Comment   Specimen Description BLOOD RIGHT ARM   Final    Special Requests NONE BOTTLES DRAWN AEROBIC AND ANAEROBIC 10 CC   Final    Culture  Setup Time [id number]   Final    Culture     Final    Value:        BLOOD CULTURE RECEIVED NO GROWTH TO DATE CULTURE WILL BE HELD FOR 5 DAYS BEFORE ISSUING A FINAL NEGATIVE REPORT   Report Status PENDING   Incomplete   CULTURE, SPUTUM-ASSESSMENT     Status: Normal   Collection Time   11/04/11 12:26 PM      Component Value Range Status Comment   Specimen Description SPUTUM   Final    Special Requests Normal   Final    Sputum evaluation     Final    Value: THIS SPECIMEN IS ACCEPTABLE. RESPIRATORY CULTURE REPORT TO FOLLOW.  Report Status 11/04/2011 FINAL   Final   CULTURE, RESPIRATORY     Status: Normal (Preliminary result)   Collection Time   11/04/11 12:26 PM      Component Value Range Status Comment   Specimen Description SPUTUM   Final    Special Requests NONE   Final    Gram Stain PENDING   Incomplete    Culture NORMAL OROPHARYNGEAL FLORA   Final    Report Status PENDING   Incomplete     Studies/Results: No results found.  Medications: I have reviewed the patient's current medications. Scheduled Meds:    . albuterol  2.5 mg Nebulization QID   And  . ipratropium  0.5 mg Nebulization QID  . antiseptic oral rinse  15 mL Mouth Rinse BID  . azithromycin  500 mg Intravenous Q24H  . cefTRIAXone (ROCEPHIN)  IV  1 g Intravenous Q24H  . divalproex  1,000 mg Oral QHS  . enoxaparin (LOVENOX) injection  40 mg Subcutaneous Q24H  . FLUoxetine  20 mg Oral Daily  . folic acid  1 mg Oral Daily  . insulin aspart  0-15 Units Subcutaneous TID WC  . linagliptin  5 mg Oral Q breakfast  . metFORMIN  1,000 mg Oral Q breakfast  . mulitivitamin with minerals  1 tablet Oral Daily  . sodium chloride  3 mL Intravenous Q12H  . thiamine  100 mg Oral Daily   Continuous Infusions:    .  sodium chloride 1,000 mL (11/06/11 0932)   PRN Meds:.acetaminophen, acetaminophen, clonazePAM, HYDROcodone-acetaminophen, LORazepam, LORazepam, ondansetron (ZOFRAN) IV, ondansetron  Assessment/Plan:   1. Pneumonia- change to levaquin  2. Sleep apnea- non compliant- needs to wear 3. Alcohol abuse- CIWA, encourage cessation 4. Schizophrenia- depakote 5. Leukocytosis- resolved 6. DM- metformin, SSI  Plan to D/C home today   LOS: 3 days  VANN, JESSICA, DO 11/06/2011, 1:51 PM

## 2011-11-06 NOTE — Progress Notes (Signed)
Utilization review complete 

## 2011-11-06 NOTE — Discharge Summary (Signed)
Discharge Summary  Joseph [name] MR#: [id number]  DOB:07/22/1976  Date of Admission: 11/03/2011 Date of Discharge: 11/06/2011  Patient's PCP: Nadara Mustard, MD, MD  Attending Physician:Tacara Hadlock   Discharge Diagnoses: Principal Problem:  *Pneumonia Active Problems:  Sleep apnea  Alcohol abuse  Schizoaffective disorder  Diabetes mellitus  Brief Admitting History and Physical Joseph [name] is an 36 y.o. male history of schizophrenia, hyperlipidemia, Lupus anticoagulant positive, sleep apnea, noncompliant with CPAP use, compartment syndrome while on statin, presents to [facility] complaining of shortness of breath and blood-tinged sputum productive cough for 2 days. He denied any chest pain, nausea, vomiting, but admitted to having fever. He has no diarrhea, myalgia, distant travel or any ill contact. Evaluation in the emergency room included a white count of 14,000, hemoglobin of 12.3, chest x-ray with retrocardiac infiltrate, and CT pulmonary angiogram showed no PE, confirmed multifocal infiltrates. He was found to be hypoxemic by pulse oximetry, and supplemental O2 was given. Hospitalist was asked to admit patient for pneumonia.   Discharge Medications Medication List  As of 11/06/2011  1:59 PM   TAKE these medications         clonazePAM 1 MG tablet   Commonly known as: KLONOPIN   Take 1 mg by mouth 2 (two) times daily as needed. For anxiety      divalproex 500 MG DR tablet   Commonly known as: DEPAKOTE   Take 1,000 mg by mouth at bedtime.      FLUoxetine 20 MG tablet   Commonly known as: PROZAC   Take 20 mg by mouth daily.      folic acid 1 MG tablet   Commonly known as: FOLVITE   Take 1 tablet (1 mg total) by mouth daily.      HYDROcodone-acetaminophen 5-500 MG per tablet   Commonly known as: VICODIN   Take 1 tablet by mouth every 6 (six) hours as needed. For pain      JANUMET XR 50-1000 MG Tb24   Generic drug: SitaGLIPtin-MetFORMIN HCl   Take 1 tablet  by mouth daily.      levofloxacin 500 MG tablet   Commonly known as: LEVAQUIN   Take 1 tablet (500 mg total) by mouth daily.      multivitamin capsule   Take 1 capsule by mouth daily.      pravastatin 40 MG tablet   Commonly known as: PRAVACHOL   Take 40 mg by mouth daily.      thiamine 100 MG tablet   Take 1 tablet (100 mg total) by mouth daily.            Hospital Course: Pneumonia- treated with IV abx til D/C then changed to PO abx, nebs .Sleep apnea- patient not compliant with wearing his CPAP mask .Alcohol abuse- encourage cessation .Schizoaffective disorder- continue meds .Diabetes mellitus- restart medications   Day of Discharge BP 125/76  Pulse 82  Temp(Src) 97.7 F (36.5 C) (Oral)  Resp 20  Ht 6\' 2"  (1.88 m)  Wt 121.4 kg (267 lb 10.2 oz)  BMI 34.36 kg/m2  SpO2 95%  Results for orders placed during the hospital encounter of 11/03/11 (from the past 48 hour(s))  GLUCOSE, CAPILLARY     Status: Abnormal   Collection Time   11/04/11  4:44 PM      Component Value Range Comment   Glucose-Capillary 116 (*) 70 - 99 (mg/dL)   GLUCOSE, CAPILLARY     Status: Abnormal   Collection Time  11/04/11  9:08 PM      Component Value Range Comment   Glucose-Capillary 105 (*) 70 - 99 (mg/dL)    Comment 1 Notify RN     CBC     Status: Abnormal   Collection Time   11/05/11  5:05 AM      Component Value Range Comment   WBC 6.8  4.0 - 10.5 (K/uL)    RBC 4.17 (*) 4.22 - 5.81 (MIL/uL)    Hemoglobin 11.9 (*) 13.0 - 17.0 (g/dL)    HCT 11.9 (*) 14.7 - 52.0 (%)    MCV 87.8  78.0 - 100.0 (fL)    MCH 28.5  26.0 - 34.0 (pg)    MCHC 32.5  30.0 - 36.0 (g/dL)    RDW 82.9 (*) 56.2 - 15.5 (%)    Platelets 171  150 - 400 (K/uL)   BASIC METABOLIC PANEL     Status: Normal   Collection Time   11/05/11  5:05 AM      Component Value Range Comment   Sodium 139  135 - 145 (mEq/L)    Potassium 3.9  3.5 - 5.1 (mEq/L)    Chloride 103  96 - 112 (mEq/L)    CO2 26  19 - 32 (mEq/L)    Glucose,  Bld 93  70 - 99 (mg/dL)    BUN 9  6 - 23 (mg/dL)    Creatinine, Ser 1.30  0.50 - 1.35 (mg/dL)    Calcium 9.0  8.4 - 10.5 (mg/dL)    GFR calc non Af Amer >90  >90 (mL/min)    GFR calc Af Amer >90  >90 (mL/min)   GLUCOSE, CAPILLARY     Status: Abnormal   Collection Time   11/05/11  7:51 AM      Component Value Range Comment   Glucose-Capillary 100 (*) 70 - 99 (mg/dL)   GLUCOSE, CAPILLARY     Status: Abnormal   Collection Time   11/05/11 11:54 AM      Component Value Range Comment   Glucose-Capillary 118 (*) 70 - 99 (mg/dL)   GLUCOSE, CAPILLARY     Status: Abnormal   Collection Time   11/05/11  5:13 PM      Component Value Range Comment   Glucose-Capillary 155 (*) 70 - 99 (mg/dL)   GLUCOSE, CAPILLARY     Status: Abnormal   Collection Time   11/05/11 10:51 PM      Component Value Range Comment   Glucose-Capillary 118 (*) 70 - 99 (mg/dL)   GLUCOSE, CAPILLARY     Status: Abnormal   Collection Time   11/06/11  7:48 AM      Component Value Range Comment   Glucose-Capillary 102 (*) 70 - 99 (mg/dL)   GLUCOSE, CAPILLARY     Status: Normal   Collection Time   11/06/11 11:55 AM      Component Value Range Comment   Glucose-Capillary 92  70 - 99 (mg/dL)     Dg Chest 2 View  05/01/5783  *RADIOLOGY REPORT*  Clinical Data: Shortness of breath  CHEST - 2 VIEW  Comparison: 07/31/2011  Findings: Retrocardiac/left lower lobe opacity, suspicious for pneumonia.  Right lung essentially clear. No pleural effusion or pneumothorax.  Cardiomediastinal silhouette is within normal limits.  Visualized osseous structures are within normal limits.  IMPRESSION: Retrocardiac/left lower lobe opacity, suspicious for pneumonia.  Original Report Authenticated By: Charline Bills, M.D.   Ct Angio Chest W/cm &/or Wo Cm  11/03/2011  *RADIOLOGY REPORT*  Clinical Data: Shortness of breath, difficulty breathing, evaluate for PE  CT ANGIOGRAPHY CHEST  Technique:  Multidetector CT imaging of the chest using the standard protocol  during bolus administration of intravenous contrast. Multiplanar reconstructed images including MIPs were obtained and reviewed to evaluate the vascular anatomy.  Contrast:  100 ml Omnipaque-300 IV  Comparison: Chest radiographs dated 11/03/2011.  Findings: No evidence of pulmonary embolism.  Multifocal patchy opacities, most prominent in the left lower lobe but also present in the right middle lobe, lingula, and medial right lower lobe, compatible with multifocal pneumonia.  No pleural effusion or pneumothorax.  Visualized thyroid is unremarkable.  The heart is normal in size.  No pericardial effusion.  Small subcentimeter mediastinal/right hilar lymph nodes, likely reactive.  Visualized upper abdomen is unremarkable.  Visualized osseous structures are within normal limits.  IMPRESSION: No evidence of pulmonary embolism.  Multifocal patchy opacities, most prominent in the left lower lobe, compatible with multifocal pneumonia.  Original Report Authenticated By: Charline Bills, M.D.     Disposition: home with parents  Diet: diabetic  Activity: gradually increase  Follow-up Appts: PCP in 1week Discharge Orders    Future Appointments: Provider: Department: Dept Phone: Center:   11/10/2011 1:45 PM Ala Dach Letitia Libra., MD [facility] [phone] [email or web address]                   Future Orders Please Complete By Expires   Diet Carb Modified      Increase activity slowly      Discharge instructions      Comments:   Stop alcohol Return to work on 2/18      Time spent on discharge, talking to the patient, and coordinating care: 37 mins.   SignedMarlin Canary, DO 11/06/2011, 1:59 PM

## 2011-11-06 NOTE — Progress Notes (Signed)
Patient discharged to home with family, discharged instructions given and reviewed with patient.  Patient verbalized understanding, care notes given for new meds and pertinent education. Skin intact at discharge, IV discharged and intact. Patient escorted to car via wheelchair by NT. 

## 2011-11-06 NOTE — Progress Notes (Signed)
   CARE MANAGEMENT NOTE 11/06/2011  Patient:  Joseph [name], Joseph [name]   Account Number:  [id number]  Date Initiated:  11/06/2011  Documentation initiated by:  Donn Pierini  Subjective/Objective Assessment:   Pt admitted with PNA     Action/Plan:   PTA pt lived at home alone, was independent with ADLs   Anticipated DC Date:  11/07/2011   Anticipated DC Plan:  HOME/SELF CARE      DC Planning Services  CM consult      Choice offered to / List presented to:             Status of service:  Completed, signed off Medicare Important Message given?   (If response is "NO", the following Medicare IM given date fields will be blank) Date Medicare IM given:   Date Additional Medicare IM given:    Discharge Disposition:  HOME/SELF CARE  Per UR Regulation:    Comments:  PCP-Duda  11/06/11- 1530- Donn Pierini RN, BSN [phone] Pt for discharge home today, no d/c needs identified

## 2011-11-07 LAB — CULTURE, RESPIRATORY W GRAM STAIN

## 2011-11-07 LAB — CULTURE, RESPIRATORY: Culture: NORMAL

## 2011-11-10 ENCOUNTER — Encounter: Payer: Self-pay | Admitting: Internal Medicine

## 2011-11-10 ENCOUNTER — Ambulatory Visit (INDEPENDENT_AMBULATORY_CARE_PROVIDER_SITE_OTHER): Payer: Medicare Other | Admitting: Internal Medicine

## 2011-11-10 DIAGNOSIS — F259 Schizoaffective disorder, unspecified: Secondary | ICD-10-CM

## 2011-11-10 DIAGNOSIS — F1010 Alcohol abuse, uncomplicated: Secondary | ICD-10-CM

## 2011-11-10 DIAGNOSIS — J189 Pneumonia, unspecified organism: Secondary | ICD-10-CM

## 2011-11-10 DIAGNOSIS — T798XXA Other early complications of trauma, initial encounter: Secondary | ICD-10-CM

## 2011-11-10 DIAGNOSIS — T79A0XA Compartment syndrome, unspecified, initial encounter: Secondary | ICD-10-CM

## 2011-11-10 LAB — CULTURE, BLOOD (ROUTINE X 2)
Culture  Setup Time: 201302090435
Culture  Setup Time: 201302090435
Culture: NO GROWTH
Culture: NO GROWTH

## 2011-11-12 DIAGNOSIS — T79A0XA Compartment syndrome, unspecified, initial encounter: Secondary | ICD-10-CM | POA: Insufficient documentation

## 2011-11-12 NOTE — Assessment & Plan Note (Signed)
Agrees to f/u with psychiatry

## 2011-11-12 NOTE — Assessment & Plan Note (Signed)
Unknown etiology. There are reports of statin induced compartment syndrome but is felt to be rare. Obtain CK enzyem at close f/u and discuss risks/benefits of continued statin use.

## 2011-11-12 NOTE — Assessment & Plan Note (Signed)
Clinically improved. Continue abx to completion. Schedule close follow up. Discuss f/u cxr at next visit

## 2011-11-12 NOTE — Assessment & Plan Note (Signed)
Denies etoh use

## 2011-11-12 NOTE — Progress Notes (Signed)
  Subjective:    Patient ID: Joseph [name], male    DOB: 1976/02/24, 36 y.o.   MRN: [id number]  HPI Pt presents to clinic to est care and for follow up of multiple medical problems. Recent hospitalization for LLL pneumonia noted on cxr and chest ct. dc'ed on levaquin which he takes without side effect. Much improved.. H/o bilateral le compartment syndrome but without a known etiology. Questions whether statin could be related. Currently taking pravastatin without myalgias or other complaints. H/o dm well controlled with recent a1c 5.8. Relates fsbs recently ~120's without hypoglycemia.  H/o schizophrenia ?schizoaffective d/o followed by psychiatry but hasn't seen recently. Self dc'ed depakote due to drowsiness. Mood and behvaior have not suffered.   Past Medical History  Diagnosis Date  . Anemia     post operative  . Hyperlipidemia   . Schizophrenia   . Anxiety   . Obesity   . Cellulitis     bilateral thighs  . Lupus anticoagulant positive 08/15/2011  . Antiphospholipid syndrome 08/15/2011  . Compartment syndrome    Past Surgical History  Procedure Date  . I&d extremity     left leg  . Tonsillectomy   . Wisdom tooth extraction   . Leg surgery     reports that he has never smoked. He has never used smokeless tobacco. He reports that he drinks about 16.8 ounces of alcohol per week. He reports that he does not use illicit drugs. family history includes Diverticulosis in his father and GER disease in his father.  There is no history of Colon cancer. Allergies  Allergen Reactions  . Haldol (Haloperidol Decanoate)     Painful      Review of Systems  Constitutional: Negative for fever and chills.  Respiratory: Positive for cough.   Musculoskeletal: Negative for myalgias.  Psychiatric/Behavioral: Negative for behavioral problems and agitation.  All other systems reviewed and are negative.       Objective:   Physical Exam  Physical Exam  Nursing note and vitals  reviewed. Constitutional: Appears well-developed and well-nourished. No distress.  HENT:  Head: Normocephalic and atraumatic.  Right Ear: External ear normal.  Left Ear: External ear normal.  Eyes: Conjunctivae are normal. No scleral icterus.  Neck: Neck supple. Carotid bruit is not present.  Cardiovascular: Normal rate, regular rhythm and normal heart sounds.  Exam reveals no gallop and no friction rub.   No murmur heard. Pulmonary/Chest: Effort normal and breath sounds normal. No respiratory distress. He has no wheezes. no rales.  Lymphadenopathy:    He has no cervical adenopathy.  Neurological:Alert.  Skin: Skin is warm and dry. Not diaphoretic.  Psychiatric: Has a normal mood and affect.        Assessment & Plan:

## 2011-11-16 NOTE — Telephone Encounter (Signed)
completed

## 2011-11-27 ENCOUNTER — Ambulatory Visit: Payer: Medicare Other | Admitting: Internal Medicine

## 2011-11-27 DIAGNOSIS — F200 Paranoid schizophrenia: Secondary | ICD-10-CM | POA: Diagnosis not present

## 2011-11-28 ENCOUNTER — Ambulatory Visit (INDEPENDENT_AMBULATORY_CARE_PROVIDER_SITE_OTHER): Payer: Medicare Other | Admitting: Internal Medicine

## 2011-11-28 ENCOUNTER — Encounter: Payer: Self-pay | Admitting: Internal Medicine

## 2011-11-28 ENCOUNTER — Ambulatory Visit (HOSPITAL_BASED_OUTPATIENT_CLINIC_OR_DEPARTMENT_OTHER)
Admission: RE | Admit: 2011-11-28 | Discharge: 2011-11-28 | Disposition: A | Discharge status: Home / Self Care | Payer: Medicare Other | Source: Ambulatory Visit | Attending: Internal Medicine | Admitting: Internal Medicine

## 2011-11-28 VITALS — BP 126/86 | HR 96 | Temp 98.6°F | Resp 18 | Ht 72.0 in

## 2011-11-28 DIAGNOSIS — J189 Pneumonia, unspecified organism: Secondary | ICD-10-CM

## 2011-11-28 DIAGNOSIS — Z09 Encounter for follow-up examination after completed treatment for conditions other than malignant neoplasm: Secondary | ICD-10-CM | POA: Insufficient documentation

## 2011-11-28 DIAGNOSIS — E785 Hyperlipidemia, unspecified: Secondary | ICD-10-CM | POA: Diagnosis not present

## 2011-11-28 NOTE — Progress Notes (Signed)
  Subjective:    Patient ID: Joseph [name], male    DOB: 1976-05-23, 36 y.o.   MRN: [id number]  HPI Pt presents to clinic for followup of multiple medical problems. Off statin tx since bilateral le compartment syndrome. Recent LLL pneumonia and is without cough, fever or chills. Recent dx of dm with a1c 5.8. Tolerating medication without adverse effect. No active complaints.  Past Medical History  Diagnosis Date  . Anemia     post operative  . Hyperlipidemia   . Schizophrenia   . Anxiety   . Obesity   . Cellulitis     bilateral thighs  . Lupus anticoagulant positive 08/15/2011  . Antiphospholipid syndrome 08/15/2011  . Compartment syndrome    Past Surgical History  Procedure Date  . I&d extremity     left leg  . Tonsillectomy   . Wisdom tooth extraction   . Leg surgery     reports that he has never smoked. He has never used smokeless tobacco. He reports that he drinks about 16.8 ounces of alcohol per week. He reports that he does not use illicit drugs. family history includes Diverticulosis in his father and GER disease in his father.  There is no history of Colon cancer. Allergies  Allergen Reactions  . Haldol (Haloperidol Decanoate)     Painful       Review of Systems see hpi     Objective:   Physical Exam  Nursing note and vitals reviewed. Constitutional: He appears well-developed and well-nourished. No distress.  HENT:  Head: Normocephalic and atraumatic.  Right Ear: External ear normal.  Left Ear: External ear normal.  Eyes: Conjunctivae are normal. No scleral icterus.  Neck: Neck supple.  Cardiovascular: Normal rate, regular rhythm and normal heart sounds.  Exam reveals no gallop and no friction rub.   No murmur heard. Pulmonary/Chest: Effort normal and breath sounds normal. No respiratory distress. He has no wheezes. He has no rales.  Neurological: He is alert.  Skin: Skin is warm and dry. He is not diaphoretic.          Assessment & Plan:

## 2011-11-28 NOTE — Patient Instructions (Signed)
Please return to lab on Thursday morning for your lipid panel (272.4) Also please schedule fasting labwork before your next appointment. Chem7, a1c, urine microalbumin 250.0

## 2011-11-30 ENCOUNTER — Telehealth: Payer: Self-pay | Admitting: *Deleted

## 2011-11-30 DIAGNOSIS — E785 Hyperlipidemia, unspecified: Secondary | ICD-10-CM | POA: Diagnosis not present

## 2011-11-30 LAB — LIPID PANEL
Cholesterol: 130 mg/dL (ref 0–200)
HDL: 46 mg/dL (ref >39)
LDL Cholesterol: 72 mg/dL (ref 0–99)
Total CHOL/HDL Ratio: 2.8 Ratio
Triglycerides: 61 mg/dL (ref <150)
VLDL: 12 mg/dL (ref 0–40)

## 2011-11-30 NOTE — Telephone Encounter (Signed)
Pt presented to the lab for fasting lipid panel. Order entered per 11/28/11 office note and forwarded to the lab.

## 2011-12-01 ENCOUNTER — Encounter: Payer: Self-pay | Admitting: Internal Medicine

## 2011-12-01 ENCOUNTER — Ambulatory Visit (INDEPENDENT_AMBULATORY_CARE_PROVIDER_SITE_OTHER): Payer: Medicare Other | Admitting: Internal Medicine

## 2011-12-01 ENCOUNTER — Ambulatory Visit: Payer: Medicare Other | Admitting: Internal Medicine

## 2011-12-01 VITALS — BP 110/76 | HR 97 | Temp 97.9°F | Resp 18

## 2011-12-01 DIAGNOSIS — Z720 Tobacco use: Secondary | ICD-10-CM

## 2011-12-01 DIAGNOSIS — Z87891 Personal history of nicotine dependence: Secondary | ICD-10-CM

## 2011-12-01 DIAGNOSIS — G4733 Obstructive sleep apnea (adult) (pediatric): Secondary | ICD-10-CM | POA: Diagnosis not present

## 2011-12-01 DIAGNOSIS — F209 Schizophrenia, unspecified: Secondary | ICD-10-CM | POA: Insufficient documentation

## 2011-12-01 DIAGNOSIS — E785 Hyperlipidemia, unspecified: Secondary | ICD-10-CM | POA: Diagnosis not present

## 2011-12-01 DIAGNOSIS — F17200 Nicotine dependence, unspecified, uncomplicated: Secondary | ICD-10-CM | POA: Insufficient documentation

## 2011-12-01 DIAGNOSIS — J029 Acute pharyngitis, unspecified: Secondary | ICD-10-CM | POA: Insufficient documentation

## 2011-12-01 HISTORY — DX: Personal history of nicotine dependence: Z87.891

## 2011-12-01 LAB — POCT RAPID STREP A (OFFICE): Rapid Strep A Screen: NEGATIVE

## 2011-12-01 NOTE — Assessment & Plan Note (Signed)
Counseled regarding the need for cessation. Discussed potential complications of continued tobacco use. Total time of discussion ~48min

## 2011-12-01 NOTE — Assessment & Plan Note (Signed)
Rapid strep neg. Followup if no improvement or worsening.  

## 2011-12-01 NOTE — Assessment & Plan Note (Signed)
Discussed pulmonary referral to reevaluation. States will consider

## 2011-12-01 NOTE — Progress Notes (Signed)
  Subjective:    Patient ID: Joseph [name], male    DOB: 12/25/1975, 36 y.o.   MRN: [id number]  HPI Pt presents to clinic for evaluation of throat discomfort. ntoes one day h/o throat irritation, nasal congestion and NP cough. No fever or chills. No sick exposures. Reviewed recent lipid panel nl off statin tx. Father states pt smoked for two days in an attempt to help anxiety. Takes klonopin under direction of psychiatry. Notes past h/o osa previously tx'ed by cpap but patient stopped. Is sleepy this am but states did not sleep well last night and took klonopin this am.   Past Medical History  Diagnosis Date  . Anemia     post operative  . Hyperlipidemia   . Schizophrenia   . Anxiety   . Obesity   . Cellulitis     bilateral thighs  . Lupus anticoagulant positive 08/15/2011  . Antiphospholipid syndrome 08/15/2011  . Compartment syndrome    Past Surgical History  Procedure Date  . I&d extremity     left leg  . Tonsillectomy   . Wisdom tooth extraction   . Leg surgery     reports that he has never smoked. He has never used smokeless tobacco. He reports that he drinks about 16.8 ounces of alcohol per week. He reports that he does not use illicit drugs. family history includes Diverticulosis in his father and GER disease in his father.  There is no history of Colon cancer. Allergies  Allergen Reactions  . Haldol (Haloperidol Decanoate)     Painful   . Statins     ? Related to compartment syndrome    Review of Systems see hpi    Objective:   Physical Exam  Nursing note and vitals reviewed. Constitutional: He appears well-developed and well-nourished. No distress.  HENT:  Head: Normocephalic and atraumatic.  Right Ear: External ear normal.  Left Ear: External ear normal.  Nose: Nose normal.  Mouth/Throat: Uvula is midline. Posterior oropharyngeal erythema present. No oropharyngeal exudate, posterior oropharyngeal edema or tonsillar abscesses.  Eyes: Conjunctivae are normal.  No scleral icterus.  Neck: Neck supple.  Pulmonary/Chest: Effort normal and breath sounds normal. No respiratory distress. He has no wheezes. He has no rales.  Skin: He is not diaphoretic.          Assessment & Plan:

## 2011-12-01 NOTE — Assessment & Plan Note (Signed)
Good control off statin

## 2011-12-02 NOTE — Assessment & Plan Note (Signed)
Concern for statin tx having contributed to compartment syndrome. Avoid statin tx. Obtain lipid profile

## 2011-12-02 NOTE — Assessment & Plan Note (Signed)
Asx. Obtain f/u cxr 

## 2011-12-07 DIAGNOSIS — F200 Paranoid schizophrenia: Secondary | ICD-10-CM | POA: Diagnosis not present

## 2011-12-13 DIAGNOSIS — F200 Paranoid schizophrenia: Secondary | ICD-10-CM | POA: Diagnosis not present

## 2011-12-21 DIAGNOSIS — F200 Paranoid schizophrenia: Secondary | ICD-10-CM | POA: Diagnosis not present

## 2012-01-04 ENCOUNTER — Ambulatory Visit (INDEPENDENT_AMBULATORY_CARE_PROVIDER_SITE_OTHER): Payer: Medicare Other | Admitting: Internal Medicine

## 2012-01-04 ENCOUNTER — Encounter: Payer: Self-pay | Admitting: Internal Medicine

## 2012-01-04 DIAGNOSIS — E119 Type 2 diabetes mellitus without complications: Secondary | ICD-10-CM

## 2012-01-04 DIAGNOSIS — Z111 Encounter for screening for respiratory tuberculosis: Secondary | ICD-10-CM | POA: Diagnosis not present

## 2012-01-04 MED ORDER — THIAMINE HCL 100 MG PO TABS: 100.0000 mg | ORAL_TABLET | Freq: Every day | ORAL | Status: DC

## 2012-01-04 MED ORDER — MULTIVITAMINS PO CAPS: 1.0000 | ORAL_CAPSULE | Freq: Every day | ORAL | Status: DC

## 2012-01-04 MED ORDER — FOLIC ACID 1 MG PO TABS: 1.0000 mg | ORAL_TABLET | Freq: Every day | ORAL | Status: DC

## 2012-01-04 MED ORDER — ASPIRIN EC 81 MG PO TBEC: 81.0000 mg | DELAYED_RELEASE_TABLET | Freq: Every day | ORAL | Status: DC

## 2012-01-04 MED ORDER — SITAGLIPTIN PHOS-METFORMIN HCL 50-500 MG PO TABS: 1.0000 | ORAL_TABLET | Freq: Two times a day (BID) | ORAL | Status: DC

## 2012-01-04 MED ORDER — DOCUSATE SODIUM 100 MG PO CAPS: 100.0000 mg | ORAL_CAPSULE | Freq: Two times a day (BID) | ORAL | Status: DC

## 2012-01-04 MED ORDER — IBUPROFEN 200 MG PO TABS: 200.0000 mg | ORAL_TABLET | Freq: Four times a day (QID) | ORAL | Status: AC | PRN

## 2012-01-04 NOTE — Patient Instructions (Signed)
Please keep follow up appointment scheduled for June 11,2013

## 2012-01-04 NOTE — Progress Notes (Signed)
  Subjective:    Patient ID: Joseph [name], male    DOB: 09-20-1976, 36 y.o.   MRN: [id number]  HPI Pt presents to clinic for followup of multiple medical problems. Recently moved back into group home setting and is accompanied by staff member today. Reports fasting glucose low 100's without hypoglycemia. Needs ppd placed and physical form completed for home. Total time of visit ~27 minutes of which >50% spent in counseling.  Past Medical History  Diagnosis Date  . Anemia     post operative  . Hyperlipidemia   . Schizophrenia   . Anxiety   . Obesity   . Cellulitis     bilateral thighs  . Lupus anticoagulant positive 08/15/2011  . Antiphospholipid syndrome 08/15/2011  . Compartment syndrome    Past Surgical History  Procedure Date  . I&d extremity     left leg  . Tonsillectomy   . Wisdom tooth extraction   . Leg surgery     reports that he has never smoked. He has never used smokeless tobacco. He reports that he drinks about 16.8 ounces of alcohol per week. He reports that he does not use illicit drugs. family history includes Diverticulosis in his father and GER disease in his father.  There is no history of Colon cancer. Allergies  Allergen Reactions  . Haldol (Haloperidol Decanoate)     Painful   . Statins     ? Related to compartment syndrome      Review of Systems see hpi     Objective:   Physical Exam  Nursing note and vitals reviewed. Constitutional: He appears well-developed and well-nourished. No distress.  HENT:  Head: Normocephalic and atraumatic.  Right Ear: Tympanic membrane, external ear and ear canal normal.  Left Ear: Tympanic membrane, external ear and ear canal normal.  Nose: Nose normal.  Mouth/Throat: Oropharynx is clear and moist. No oropharyngeal exudate.  Eyes: Conjunctivae and EOM are normal. Pupils are equal, round, and reactive to light. No scleral icterus.  Neck: Neck supple. No thyromegaly present.  Cardiovascular: Normal rate, regular  rhythm and normal heart sounds.  Exam reveals no gallop and no friction rub.   No murmur heard. Pulmonary/Chest: Effort normal and breath sounds normal. No respiratory distress. He has no wheezes. He has no rales.  Abdominal: Soft. Bowel sounds are normal. He exhibits no distension and no mass. There is no hepatomegaly. There is no tenderness. There is no rebound and no guarding.  Lymphadenopathy:    He has no cervical adenopathy.  Skin: He is not diaphoretic.          Assessment & Plan:

## 2012-01-04 NOTE — Assessment & Plan Note (Signed)
Good control based on fsbs. Discussed nutrition consult and pt will consider

## 2012-01-08 DIAGNOSIS — F200 Paranoid schizophrenia: Secondary | ICD-10-CM | POA: Diagnosis not present

## 2012-01-08 DIAGNOSIS — E7800 Pure hypercholesterolemia, unspecified: Secondary | ICD-10-CM | POA: Diagnosis not present

## 2012-01-08 DIAGNOSIS — E119 Type 2 diabetes mellitus without complications: Secondary | ICD-10-CM | POA: Diagnosis not present

## 2012-01-08 DIAGNOSIS — Z0000 Encounter for general adult medical examination without abnormal findings: Secondary | ICD-10-CM | POA: Diagnosis not present

## 2012-01-09 ENCOUNTER — Telehealth: Payer: Self-pay | Admitting: *Deleted

## 2012-01-09 DIAGNOSIS — R7611 Nonspecific reaction to tuberculin skin test without active tuberculosis: Secondary | ICD-10-CM

## 2012-01-09 LAB — TB SKIN TEST
Induration: 15
TB Skin Test: POSITIVE mm

## 2012-01-09 NOTE — Telephone Encounter (Signed)
Call placed to [facility] [phone] spoke with Joan Mayans, she stated when ppd was read it was 15 mm induration.  Lequita Halt stated patient has a cough, but she is unable to determine if that is an actual symptom because the patient is a heavy smoker. Lequita Halt stated that she will arrange for patient to have Chest xray done on Wednesday 01/10/2012.  Call placed to [facility], spoke with Alphonse Guild, she has confirmed that patient has a positive ppd. She was advised patient will need follow up per Dr Rodena Medin instruction. She has scheduled patient  For 8:45 am on 01/10/2012. She was advised to have patient obtain chest xray around 8am prior to appointment. She has verbalized understanding and stated patient will be in a 8am.

## 2012-01-09 NOTE — Telephone Encounter (Signed)
cxr pa/lat dx +PPD. Need mm of induration for ppd since we didn't read it. Confirm no active tb sx's (cough, hemoptysis, sweats, wt loss)

## 2012-01-09 NOTE — Telephone Encounter (Signed)
Alphonse Guild called and left voice message stating a nurse has read patients ppd with definite positive reading and they would like to know what the next step is.

## 2012-01-10 ENCOUNTER — Ambulatory Visit (INDEPENDENT_AMBULATORY_CARE_PROVIDER_SITE_OTHER): Payer: Medicare Other | Admitting: Internal Medicine

## 2012-01-10 ENCOUNTER — Encounter: Payer: Self-pay | Admitting: Internal Medicine

## 2012-01-10 ENCOUNTER — Ambulatory Visit (HOSPITAL_BASED_OUTPATIENT_CLINIC_OR_DEPARTMENT_OTHER)
Admission: RE | Admit: 2012-01-10 | Discharge: 2012-01-10 | Disposition: A | Discharge status: Home / Self Care | Payer: Medicare Other | Source: Ambulatory Visit | Attending: Internal Medicine | Admitting: Internal Medicine

## 2012-01-10 VITALS — BP 124/84 | HR 102 | Temp 97.9°F

## 2012-01-10 DIAGNOSIS — R0602 Shortness of breath: Secondary | ICD-10-CM | POA: Diagnosis not present

## 2012-01-10 DIAGNOSIS — E119 Type 2 diabetes mellitus without complications: Secondary | ICD-10-CM | POA: Diagnosis not present

## 2012-01-10 DIAGNOSIS — R7611 Nonspecific reaction to tuberculin skin test without active tuberculosis: Secondary | ICD-10-CM | POA: Diagnosis not present

## 2012-01-10 DIAGNOSIS — R7401 Elevation of levels of liver transaminase levels: Secondary | ICD-10-CM

## 2012-01-10 DIAGNOSIS — R748 Abnormal levels of other serum enzymes: Secondary | ICD-10-CM

## 2012-01-10 DIAGNOSIS — R7402 Elevation of levels of lactic acid dehydrogenase (LDH): Secondary | ICD-10-CM | POA: Diagnosis not present

## 2012-01-10 LAB — HEPATIC FUNCTION PANEL
ALT: 31 U/L (ref 0–53)
AST: 22 U/L (ref 0–37)
Albumin: 4.7 g/dL (ref 3.5–5.2)
Alkaline Phosphatase: 53 U/L (ref 39–117)
Bilirubin, Direct: 0.1 mg/dL (ref 0.0–0.3)
Indirect Bilirubin: 0.2 mg/dL (ref 0.0–0.9)
Total Bilirubin: 0.3 mg/dL (ref 0.3–1.2)
Total Protein: 7 g/dL (ref 6.0–8.3)

## 2012-01-10 MED ORDER — B-6 50 MG PO TABS: ORAL_TABLET | ORAL | Status: DC

## 2012-01-10 MED ORDER — ISONIAZID 300 MG PO TABS: 300.0000 mg | ORAL_TABLET | Freq: Every day | ORAL | Status: AC

## 2012-01-10 NOTE — Patient Instructions (Signed)
Cancel June appointment.  Schedule [id number]-v58.69 prior to next appointment

## 2012-01-11 LAB — MICROALBUMIN / CREATININE URINE RATIO
Creatinine, Urine: 145.5 mg/dL
Microalb Creat Ratio: 5.3 mg/g (ref 0.0–30.0)
Microalb, Ur: 0.77 mg/dL (ref 0.00–1.89)

## 2012-01-12 ENCOUNTER — Telehealth: Payer: Self-pay | Admitting: *Deleted

## 2012-01-12 NOTE — Telephone Encounter (Signed)
Call placed to [facility]. I spoke with Alphonse Guild, she was informed per Dr Pollyann Kennedy instructions and has verbalized understanding.She acknowledged to have patient start the INH.  She has requested the physical form faxed to [phone].

## 2012-01-12 NOTE — Telephone Encounter (Signed)
Message copied by Glendell Docker on Fri Jan 12, 2012  4:53 PM ------      Message from: Staci Righter.      Created: Thu Jan 11, 2012  8:53 PM       lft nl. Ok to begin inh

## 2012-01-14 DIAGNOSIS — R748 Abnormal levels of other serum enzymes: Secondary | ICD-10-CM | POA: Insufficient documentation

## 2012-01-14 DIAGNOSIS — R7611 Nonspecific reaction to tuberculin skin test without active tuberculosis: Secondary | ICD-10-CM | POA: Insufficient documentation

## 2012-01-14 NOTE — Assessment & Plan Note (Signed)
Asx. 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 Discussed potential side effects of INH including hepatotoxicity and neuropathy. Pt wishes to proceed with INH tx. Obtain lft and if nl begin medication with b6.

## 2012-01-14 NOTE — Assessment & Plan Note (Signed)
Obtain lft. Advised to avoid etoh. States understanding.

## 2012-01-14 NOTE — Progress Notes (Signed)
  Subjective:    Patient ID: Joseph [name], male    DOB: May 26, 1976, 36 y.o.   MRN: [id number]  HPI Pt presents to clinic for evaluation of +PPD. Recently moved back into a group home and had ppd placed and read as + 15mm induration. Reviewed by me today at 15mm. No known h/o tb, bcg or tb exposure. No hemoptysis, sweats or weight loss. Chronic intermittent NP cough that is described as mild and felt to be related to smoking. CXR reviewed performed today and reviewed without infiltrate. H/o mildly elevated lfts. Denies etoh use current or recent.  Was seen by endocrinology at request of father and combo medication changed to monotherapy. No other complaint.  Past Medical History  Diagnosis Date  . Anemia     post operative  . Hyperlipidemia   . Schizophrenia   . Anxiety   . Obesity   . Cellulitis     bilateral thighs  . Lupus anticoagulant positive 08/15/2011  . Antiphospholipid syndrome 08/15/2011  . Compartment syndrome    Past Surgical History  Procedure Date  . I&d extremity     left leg  . Tonsillectomy   . Wisdom tooth extraction   . Leg surgery     reports that he has been smoking.  He has never used smokeless tobacco. He reports that he drinks about 16.8 ounces of alcohol per week. He reports that he does not use illicit drugs. family history includes Diverticulosis in his father and GER disease in his father.  There is no history of Colon cancer. Allergies  Allergen Reactions  . Haldol (Haloperidol Decanoate)     Painful   . Statins     ? Related to compartment syndrome     Review of Systems see hpi     Objective:   Physical Exam  Nursing note and vitals reviewed. Constitutional: He appears well-developed and well-nourished. No distress.  HENT:  Head: Normocephalic and atraumatic.  Eyes: Conjunctivae are normal. No scleral icterus.  Cardiovascular: Normal rate, regular rhythm and normal heart sounds.   Pulmonary/Chest: Effort normal and breath sounds normal.  No respiratory distress. He has no wheezes. He has no rales.  Neurological: He is alert.  Skin: Skin is warm and dry. He is not diaphoretic.          Assessment & Plan:

## 2012-01-15 DIAGNOSIS — F209 Schizophrenia, unspecified: Secondary | ICD-10-CM | POA: Diagnosis not present

## 2012-01-19 ENCOUNTER — Telehealth: Payer: Self-pay | Admitting: *Deleted

## 2012-01-19 NOTE — Telephone Encounter (Signed)
Voice message received from Macario Golds asst. Interior and spatial designer at [employer]. Her voice message stated there was a delay in getting the Rx for the Isoniazid for patient and he will be starting the medication today. The medication was received, and she would like to know if patient should discontinue the thiamine and folic acid since he is starting B6. Her message stated she was not sure if Dr Rodena Medin wanted patient to continue the additional B-vitamins since he will be taking the B6.

## 2012-01-19 NOTE — Telephone Encounter (Signed)
He can continue thiamin and folic acid with the b6 vitamin.

## 2012-01-19 NOTE — Telephone Encounter (Signed)
Call returned to Joseph [name] at [phone], she was advised per [facility]'s instructions and has verbalized understanding.

## 2012-01-29 DIAGNOSIS — F200 Paranoid schizophrenia: Secondary | ICD-10-CM | POA: Diagnosis not present

## 2012-02-08 ENCOUNTER — Ambulatory Visit: Payer: Medicare Other | Admitting: Internal Medicine

## 2012-02-13 ENCOUNTER — Ambulatory Visit (INDEPENDENT_AMBULATORY_CARE_PROVIDER_SITE_OTHER): Payer: Medicare Other | Admitting: Internal Medicine

## 2012-02-13 ENCOUNTER — Ambulatory Visit: Payer: Medicare Other | Admitting: Internal Medicine

## 2012-02-13 ENCOUNTER — Encounter: Payer: Self-pay | Admitting: Internal Medicine

## 2012-02-13 VITALS — BP 130/82 | HR 91 | Temp 97.9°F | Resp 20 | Wt 256.0 lb

## 2012-02-13 DIAGNOSIS — R7611 Nonspecific reaction to tuberculin skin test without active tuberculosis: Secondary | ICD-10-CM | POA: Diagnosis not present

## 2012-02-13 NOTE — Patient Instructions (Signed)
Please cancel your June appointment

## 2012-02-13 NOTE — Progress Notes (Signed)
  Subjective:    Patient ID: Joseph [name], male    DOB: 1976/05/16, 36 y.o.   MRN: [id number]  HPI Pt presents to clinic for followup of multiple medical problems. Tolerating INH without complaint. No definitive neuropathy sx's. Baseline lfts prior to beginning tx were nl. Denies recent etoh use. Seeing endocrine for DM currently. Total time of visit ~26 minutes of which >50% spent in counseling.  Past Medical History  Diagnosis Date  . Anemia     post operative  . Hyperlipidemia   . Schizophrenia   . Anxiety   . Obesity   . Cellulitis     bilateral thighs  . Lupus anticoagulant positive 08/15/2011  . Antiphospholipid syndrome 08/15/2011  . Compartment syndrome    Past Surgical History  Procedure Date  . I&d extremity     left leg  . Tonsillectomy   . Wisdom tooth extraction   . Leg surgery     reports that he has been smoking.  He has never used smokeless tobacco. He reports that he drinks about 16.8 ounces of alcohol per week. He reports that he does not use illicit drugs. family history includes Diverticulosis in his father and GER disease in his father.  There is no history of Colon cancer. Allergies  Allergen Reactions  . Haldol (Haloperidol Decanoate)     Painful   . Statins     ? Related to compartment syndrome      Review of Systems see hpi     Objective:   Physical Exam  Physical Exam  Nursing note and vitals reviewed. Constitutional: Appears well-developed and well-nourished. No distress.  HENT:  Head: Normocephalic and atraumatic.  Right Ear: External ear normal.  Left Ear: External ear normal.  Eyes: Conjunctivae are normal. No scleral icterus.  Neck: Neck supple. Carotid bruit is not present.  Cardiovascular: Normal rate, regular rhythm and normal heart sounds.  Exam reveals no gallop and no friction rub.   No murmur heard. Pulmonary/Chest: Effort normal and breath sounds normal. No respiratory distress. He has no wheezes. no rales.    Lymphadenopathy:    He has no cervical adenopathy.  Neurological:Alert.  Skin: Skin is warm and dry. Not diaphoretic.  Psychiatric: Has a normal mood and affect.        Assessment & Plan:

## 2012-02-14 ENCOUNTER — Ambulatory Visit: Payer: Medicare Other | Admitting: Internal Medicine

## 2012-02-14 NOTE — Assessment & Plan Note (Signed)
Tolerating INH. Continue for 9 month course. Given past h/o abn lft will schedule periodic lft monitoring. Continue b6 to diminish chance of neuropathy.

## 2012-02-22 DIAGNOSIS — F200 Paranoid schizophrenia: Secondary | ICD-10-CM | POA: Diagnosis not present

## 2012-03-05 ENCOUNTER — Ambulatory Visit: Payer: Medicare Other | Admitting: Internal Medicine

## 2012-03-07 ENCOUNTER — Other Ambulatory Visit: Payer: Self-pay | Admitting: Internal Medicine

## 2012-03-07 DIAGNOSIS — Z79899 Other long term (current) drug therapy: Secondary | ICD-10-CM | POA: Diagnosis not present

## 2012-03-08 LAB — HEPATIC FUNCTION PANEL
ALT: 25 U/L (ref 0–53)
AST: 20 U/L (ref 0–37)
Albumin: 4 g/dL (ref 3.5–5.2)
Alkaline Phosphatase: 55 U/L (ref 39–117)
Bilirubin, Direct: <0.1 mg/dL (ref 0.0–0.3)
Total Bilirubin: 0.2 mg/dL — ABNORMAL LOW (ref 0.3–1.2)
Total Protein: 6.3 g/dL (ref 6.0–8.3)

## 2012-03-11 DIAGNOSIS — F200 Paranoid schizophrenia: Secondary | ICD-10-CM | POA: Diagnosis not present

## 2012-04-08 DIAGNOSIS — F200 Paranoid schizophrenia: Secondary | ICD-10-CM | POA: Diagnosis not present

## 2012-04-19 DIAGNOSIS — F200 Paranoid schizophrenia: Secondary | ICD-10-CM | POA: Diagnosis not present

## 2012-04-29 DIAGNOSIS — F200 Paranoid schizophrenia: Secondary | ICD-10-CM | POA: Diagnosis not present

## 2012-05-01 ENCOUNTER — Other Ambulatory Visit: Payer: Self-pay | Admitting: Internal Medicine

## 2012-05-01 DIAGNOSIS — Z79899 Other long term (current) drug therapy: Secondary | ICD-10-CM | POA: Diagnosis not present

## 2012-05-01 LAB — HEPATIC FUNCTION PANEL
ALT: 33 U/L (ref 0–53)
AST: 32 U/L (ref 0–37)
Albumin: 4 g/dL (ref 3.5–5.2)
Alkaline Phosphatase: 63 U/L (ref 39–117)
Bilirubin, Direct: 0.1 mg/dL (ref 0.0–0.3)
Indirect Bilirubin: 0.3 mg/dL (ref 0.0–0.9)
Total Bilirubin: 0.4 mg/dL (ref 0.3–1.2)
Total Protein: 6.7 g/dL (ref 6.0–8.3)

## 2012-05-08 DIAGNOSIS — E119 Type 2 diabetes mellitus without complications: Secondary | ICD-10-CM | POA: Diagnosis not present

## 2012-05-08 DIAGNOSIS — E7800 Pure hypercholesterolemia, unspecified: Secondary | ICD-10-CM | POA: Diagnosis not present

## 2012-05-16 DIAGNOSIS — F200 Paranoid schizophrenia: Secondary | ICD-10-CM | POA: Diagnosis not present

## 2012-05-20 DIAGNOSIS — F200 Paranoid schizophrenia: Secondary | ICD-10-CM | POA: Diagnosis not present

## 2012-06-06 DIAGNOSIS — F200 Paranoid schizophrenia: Secondary | ICD-10-CM | POA: Diagnosis not present

## 2012-06-11 DIAGNOSIS — Z23 Encounter for immunization: Secondary | ICD-10-CM | POA: Diagnosis not present

## 2012-06-13 ENCOUNTER — Telehealth: Payer: Self-pay

## 2012-06-20 DIAGNOSIS — F200 Paranoid schizophrenia: Secondary | ICD-10-CM | POA: Diagnosis not present

## 2012-06-21 ENCOUNTER — Encounter: Payer: Self-pay | Admitting: Internal Medicine

## 2012-06-21 ENCOUNTER — Ambulatory Visit (INDEPENDENT_AMBULATORY_CARE_PROVIDER_SITE_OTHER): Payer: Medicare Other | Admitting: Internal Medicine

## 2012-06-21 ENCOUNTER — Ambulatory Visit (HOSPITAL_BASED_OUTPATIENT_CLINIC_OR_DEPARTMENT_OTHER)
Admission: RE | Admit: 2012-06-21 | Discharge: 2012-06-21 | Disposition: A | Discharge status: Home / Self Care | Payer: Medicare Other | Source: Ambulatory Visit | Attending: Internal Medicine | Admitting: Internal Medicine

## 2012-06-21 VITALS — BP 100/74 | HR 97 | Temp 98.2°F | Resp 16 | Wt 257.0 lb

## 2012-06-21 DIAGNOSIS — E119 Type 2 diabetes mellitus without complications: Secondary | ICD-10-CM

## 2012-06-21 DIAGNOSIS — M5412 Radiculopathy, cervical region: Secondary | ICD-10-CM

## 2012-06-21 DIAGNOSIS — Z79899 Other long term (current) drug therapy: Secondary | ICD-10-CM

## 2012-06-21 DIAGNOSIS — M47812 Spondylosis without myelopathy or radiculopathy, cervical region: Secondary | ICD-10-CM | POA: Insufficient documentation

## 2012-06-21 DIAGNOSIS — R209 Unspecified disturbances of skin sensation: Secondary | ICD-10-CM | POA: Insufficient documentation

## 2012-06-21 DIAGNOSIS — E785 Hyperlipidemia, unspecified: Secondary | ICD-10-CM

## 2012-06-21 DIAGNOSIS — R7611 Nonspecific reaction to tuberculin skin test without active tuberculosis: Secondary | ICD-10-CM

## 2012-06-21 DIAGNOSIS — R202 Paresthesia of skin: Secondary | ICD-10-CM

## 2012-06-21 DIAGNOSIS — M5030 Other cervical disc degeneration, unspecified cervical region: Secondary | ICD-10-CM | POA: Diagnosis not present

## 2012-06-21 LAB — CBC WITH DIFFERENTIAL/PLATELET
Basophils Absolute: 0 10*3/uL (ref 0.0–0.1)
Basophils Relative: 0 % (ref 0–1)
Eosinophils Absolute: 0.4 10*3/uL (ref 0.0–0.7)
Eosinophils Relative: 5 % (ref 0–5)
HCT: 43.5 % (ref 39.0–52.0)
Hemoglobin: 14.9 g/dL (ref 13.0–17.0)
Lymphocytes Relative: 32 % (ref 12–46)
Lymphs Abs: 2.3 10*3/uL (ref 0.7–4.0)
MCH: 31.2 pg (ref 26.0–34.0)
MCHC: 34.3 g/dL (ref 30.0–36.0)
MCV: 91 fL (ref 78.0–100.0)
Monocytes Absolute: 0.8 10*3/uL (ref 0.1–1.0)
Monocytes Relative: 11 % (ref 3–12)
Neutro Abs: 3.7 10*3/uL (ref 1.7–7.7)
Neutrophils Relative %: 52 % (ref 43–77)
Platelets: 275 10*3/uL (ref 150–400)
RBC: 4.78 MIL/uL (ref 4.22–5.81)
RDW: 12.7 % (ref 11.5–15.5)
WBC: 7.1 10*3/uL (ref 4.0–10.5)

## 2012-06-21 NOTE — Assessment & Plan Note (Signed)
Currently asymptomatic. History suggestive of radicular symptoms. Exam nonfocal. Obtain C-spine radiograph and followup  closely if any further symptoms.

## 2012-06-21 NOTE — Assessment & Plan Note (Signed)
Continue INH for a total of ten months. Continue B6.

## 2012-06-21 NOTE — Assessment & Plan Note (Signed)
Currently followed by endocrinology

## 2012-06-21 NOTE — Assessment & Plan Note (Signed)
Obtain fasting lipid profile and liver function tests. Avoid statin therapy

## 2012-06-21 NOTE — Progress Notes (Signed)
  Subjective:    Patient ID: Joseph [name], male    DOB: 1976/08/03, 36 y.o.   MRN: [id number]  HPI Pt presents to clinic for followup of multiple medical problems. Tolerating INH without evidence of neuropathy or abnormal LFTs. Began in April. History of hyperlipidemia and cannot tolerate statin therapy as it was suspected to have caused his compartment syndrome. Notes two episodes intermittently of bilateral arm numbness and tingling involving the entire arm and hands. He was subjective weakness. Duration was four minutes followed by spontaneous resolution. No injury or other trigger noted. Last occurrence was several weeks ago. Denies neck pain or other neurologic deficit.  Past Medical History  Diagnosis Date  . Anemia     post operative  . Hyperlipidemia   . Schizophrenia   . Anxiety   . Obesity   . Cellulitis     bilateral thighs  . Lupus anticoagulant positive 08/15/2011  . Antiphospholipid syndrome 08/15/2011  . Compartment syndrome    Past Surgical History  Procedure Date  . I&d extremity     left leg  . Tonsillectomy   . Wisdom tooth extraction   . Leg surgery     reports that he has been smoking.  He has never used smokeless tobacco. He reports that he drinks about 16.8 ounces of alcohol per week. He reports that he does not use illicit drugs. family history includes Diverticulosis in his father and GER disease in his father.  There is no history of Colon cancer. Allergies  Allergen Reactions  . Haldol (Haloperidol Decanoate)     Painful   . Statins     ? Related to compartment syndrome      Review of Systems see hpi     Objective:   Physical Exam  Physical Exam  Nursing note and vitals reviewed. Constitutional: Appears well-developed and well-nourished. No distress.  HENT:  Head: Normocephalic and atraumatic.  Right Ear: External ear normal.  Left Ear: External ear normal.  Eyes: Conjunctivae are normal. No scleral icterus.  Neck: Neck supple.  Carotid bruit is not present.  Cardiovascular: Normal rate, regular rhythm and normal heart sounds.  Exam reveals no gallop and no friction rub.   No murmur heard. Pulmonary/Chest: Effort normal and breath sounds normal. No respiratory distress. He has no wheezes. no rales.  Lymphadenopathy:    He has no cervical adenopathy.  Neurological:Alert.  Skin: Skin is warm and dry. Not diaphoretic.  Psychiatric: Has a normal mood and affect.  Muscular skeletal: Five over five strength bilateral upper chimneys including MCP wrist flexor extensors as well as intertriginous muscles. No atrophy noted. Full range of motion bilateral arms.      Assessment & Plan:

## 2012-06-22 LAB — BASIC METABOLIC PANEL WITH GFR
BUN: 11 mg/dL (ref 6–23)
CO2: 28 meq/L (ref 19–32)
Calcium: 9 mg/dL (ref 8.4–10.5)
Chloride: 104 meq/L (ref 96–112)
Creat: 0.77 mg/dL (ref 0.50–1.35)
Glucose, Bld: 66 mg/dL — ABNORMAL LOW (ref 70–99)
Potassium: 4.3 meq/L (ref 3.5–5.3)
Sodium: 139 meq/L (ref 135–145)

## 2012-06-22 LAB — HEPATIC FUNCTION PANEL
ALT: 55 U/L — ABNORMAL HIGH (ref 0–53)
AST: 31 U/L (ref 0–37)
Albumin: 4.3 g/dL (ref 3.5–5.2)
Alkaline Phosphatase: 67 U/L (ref 39–117)
Bilirubin, Direct: 0.1 mg/dL (ref 0.0–0.3)
Indirect Bilirubin: 0.3 mg/dL (ref 0.0–0.9)
Total Bilirubin: 0.4 mg/dL (ref 0.3–1.2)
Total Protein: 6.8 g/dL (ref 6.0–8.3)

## 2012-06-22 LAB — LIPID PANEL
Cholesterol: 171 mg/dL (ref 0–200)
HDL: 35 mg/dL — ABNORMAL LOW (ref >39)
LDL Cholesterol: 85 mg/dL (ref 0–99)
Total CHOL/HDL Ratio: 4.9 Ratio
Triglycerides: 254 mg/dL — ABNORMAL HIGH (ref <150)
VLDL: 51 mg/dL — ABNORMAL HIGH (ref 0–40)

## 2012-06-22 LAB — VALPROIC ACID LEVEL: Valproic Acid Lvl: 28.3 ug/mL — ABNORMAL LOW (ref 50.0–100.0)

## 2012-06-22 LAB — VITAMIN B12: Vitamin B-12: 958 pg/mL — ABNORMAL HIGH (ref 211–911)

## 2012-07-02 DIAGNOSIS — F200 Paranoid schizophrenia: Secondary | ICD-10-CM | POA: Diagnosis not present

## 2012-07-08 ENCOUNTER — Other Ambulatory Visit: Payer: Self-pay | Admitting: Internal Medicine

## 2012-07-10 NOTE — Telephone Encounter (Signed)
Done/SLS 

## 2012-07-15 ENCOUNTER — Other Ambulatory Visit: Payer: Self-pay | Admitting: Internal Medicine

## 2012-07-15 DIAGNOSIS — F200 Paranoid schizophrenia: Secondary | ICD-10-CM | POA: Diagnosis not present

## 2012-07-16 NOTE — Telephone Encounter (Signed)
Rx to pharmacy/SLS 

## 2012-07-26 DIAGNOSIS — Q66229 Congenital metatarsus adductus, unspecified foot: Secondary | ICD-10-CM | POA: Diagnosis not present

## 2012-07-26 DIAGNOSIS — Q828 Other specified congenital malformations of skin: Secondary | ICD-10-CM | POA: Diagnosis not present

## 2012-07-29 DIAGNOSIS — F200 Paranoid schizophrenia: Secondary | ICD-10-CM | POA: Diagnosis not present

## 2012-07-31 ENCOUNTER — Telehealth: Payer: Self-pay

## 2012-07-31 NOTE — Telephone Encounter (Signed)
Delmore STATES HIS SON HAD REQUESTED HIS RECORDS OVER 2 WEEKS AGO AND THEY STILL HAVEN'T BEEN COPIED FOR HIM. HE HAD SIGNED A RELEASE FOR HIS FATHER TO P/U. PLEASE CALL [phone] AND LEAVE MESSAGE WHEN THEY WOULD BE READY

## 2012-08-05 NOTE — Telephone Encounter (Signed)
Records ready for pick up. Patient not home but left message.

## 2012-08-12 DIAGNOSIS — F200 Paranoid schizophrenia: Secondary | ICD-10-CM | POA: Diagnosis not present

## 2012-08-16 ENCOUNTER — Other Ambulatory Visit: Payer: Self-pay | Admitting: Internal Medicine

## 2012-08-16 DIAGNOSIS — E785 Hyperlipidemia, unspecified: Secondary | ICD-10-CM | POA: Diagnosis not present

## 2012-08-16 DIAGNOSIS — Z79899 Other long term (current) drug therapy: Secondary | ICD-10-CM | POA: Diagnosis not present

## 2012-08-16 DIAGNOSIS — R209 Unspecified disturbances of skin sensation: Secondary | ICD-10-CM | POA: Diagnosis not present

## 2012-08-17 LAB — HEPATIC FUNCTION PANEL
ALT: 59 U/L — ABNORMAL HIGH (ref 0–53)
AST: 35 U/L (ref 0–37)
Albumin: 4.3 g/dL (ref 3.5–5.2)
Alkaline Phosphatase: 60 U/L (ref 39–117)
Bilirubin, Direct: <0.1 mg/dL (ref 0.0–0.3)
Total Bilirubin: 0.2 mg/dL — ABNORMAL LOW (ref 0.3–1.2)
Total Protein: 6.6 g/dL (ref 6.0–8.3)

## 2012-08-19 DIAGNOSIS — F200 Paranoid schizophrenia: Secondary | ICD-10-CM | POA: Diagnosis not present

## 2012-08-20 DIAGNOSIS — Q828 Other specified congenital malformations of skin: Secondary | ICD-10-CM | POA: Diagnosis not present

## 2012-09-02 ENCOUNTER — Ambulatory Visit (HOSPITAL_BASED_OUTPATIENT_CLINIC_OR_DEPARTMENT_OTHER): Payer: Medicare Other | Admitting: Oncology

## 2012-09-02 ENCOUNTER — Other Ambulatory Visit (HOSPITAL_BASED_OUTPATIENT_CLINIC_OR_DEPARTMENT_OTHER): Payer: Medicare Other | Admitting: Lab

## 2012-09-02 ENCOUNTER — Encounter: Payer: Self-pay | Admitting: Oncology

## 2012-09-02 ENCOUNTER — Telehealth: Payer: Self-pay | Admitting: *Deleted

## 2012-09-02 VITALS — BP 129/76 | HR 75 | Temp 98.9°F | Resp 20 | Ht 72.0 in | Wt 265.9 lb

## 2012-09-02 DIAGNOSIS — D509 Iron deficiency anemia, unspecified: Secondary | ICD-10-CM | POA: Diagnosis not present

## 2012-09-02 DIAGNOSIS — D649 Anemia, unspecified: Secondary | ICD-10-CM

## 2012-09-02 DIAGNOSIS — T79A0XA Compartment syndrome, unspecified, initial encounter: Secondary | ICD-10-CM | POA: Diagnosis not present

## 2012-09-02 NOTE — Telephone Encounter (Signed)
Per orders patient should come by as needed

## 2012-09-02 NOTE — Patient Instructions (Addendum)
Doing well  I can see you as needed

## 2012-09-02 NOTE — Progress Notes (Signed)
OFFICE PROGRESS NOTE  CC Hodgin Karene Fry, MD [address] [place] [address]  DIAGNOSIS: 36 year old gentleman with bilateral compartment syndrome and iron deficiency   CURRENT THERAPY: Observation  INTERVAL HISTORY: Joseph [name] 36 y.o. male returns forfollowup visit today. Clinically he is doing well without any problems. He denies any fevers chills night sweats headaches no recent hospitalizations. Of note patient recently was found to have a positive PPD test and he is on isoniazid for one year. He is tolerating this well. Remainder of the 10 point review of systems is unremarkable the MEDICAL HISTORY: Past Medical History  Diagnosis Date  . Anemia     post operative  . Hyperlipidemia   . Schizophrenia   . Anxiety   . Obesity   . Cellulitis     bilateral thighs  . Lupus anticoagulant positive 08/15/2011  . Antiphospholipid syndrome 08/15/2011  . Compartment syndrome     ALLERGIES:  is allergic to haldol and statins.  MEDICATIONS:  Current Outpatient Prescriptions  Medication Sig Dispense Refill  . aspirin EC 81 MG tablet Take 1 tablet (81 mg total) by mouth daily.  30 tablet  11  . clonazePAM (KLONOPIN) 1 MG tablet Take 1 mg by mouth 2 (two) times daily. For anxiety      . divalproex (DEPAKOTE) 500 MG EC tablet Take 1,000 mg by mouth at bedtime.       . docusate sodium (COLACE) 100 MG capsule Take 100 mg by mouth 2 (two) times daily.      Marland Kitchen FLUoxetine (PROZAC) 20 MG tablet Take 20 mg by mouth at bedtime.       . folic acid (FOLVITE) 1 MG tablet Take 1 tablet (1 mg total) by mouth daily.  30 tablet  11  . ibuprofen (ADVIL,MOTRIN) 200 MG tablet Take 200 mg by mouth every 6 (six) hours as needed.      . isoniazid (NYDRAZID) 300 MG tablet take 1 tablet by mouth once daily  30 tablet  3  . metFORMIN (GLUCOPHAGE) 500 MG tablet Take 500 mg by mouth 2 (two) times daily.       . Multiple Vitamin (MULTIVITAMIN) capsule Take 1 capsule by mouth  daily.  30 capsule  11  . OLANZapine (ZYPREXA) 20 MG tablet Take 40 mg by mouth at bedtime.      Marland Kitchen RA VITAMIN B-6 50 MG tablet take 1 tablet by mouth once daily  30 each  5  . thiamine 100 MG tablet Take 1 tablet (100 mg total) by mouth daily.  30 tablet  11    SURGICAL HISTORY:  Past Surgical History  Procedure Date  . I&d extremity     left leg  . Tonsillectomy   . Wisdom tooth extraction   . Leg surgery     REVIEW OF SYSTEMS:  Pertinent items are noted in HPI.    ECOG PERFORMANCE STATUS: 1 - Symptomatic but completely ambulatory  Blood pressure 129/76, pulse 75, temperature 98.9 F (37.2 C), temperature source Oral, resp. rate 20, height 6' (1.829 m), weight 265 lb 14.4 oz (120.611 kg).  LABORATORY DATA: Lab Results  Component Value Date   WBC 7.1 06/21/2012   HGB 14.9 06/21/2012   HCT 43.5 06/21/2012   MCV 91.0 06/21/2012   PLT 275 06/21/2012      Chemistry      Component Value Date/Time   NA 139 06/21/2012 1055   K 4.3 06/21/2012 1055   CL 104  06/21/2012 1055   CO2 28 06/21/2012 1055   BUN 11 06/21/2012 1055   CREATININE 0.77 06/21/2012 1055   CREATININE 0.84 11/05/2011 0505      Component Value Date/Time   CALCIUM 9.0 06/21/2012 1055   ALKPHOS 60 08/16/2012 1046   AST 35 08/16/2012 1046   ALT 59* 08/16/2012 1046   BILITOT 0.2* 08/16/2012 1046     ASSESSMENT: 36 year old gentleman with  #1 history of bilateral lower extremity compartment syndrome. No primary hypercoagulability has been found to account for the development of compartment syndrome. I do think that it this may be vascular in nature.  #2 history of iron deficiency status post parenteral iron  PLAN:   #1 overall patient has done well over the last 1 year. As of 06/21/2012 his blood counts look great.  #2 patient will be seen on as needed basis.  All questions were answered. The patient knows to call the clinic with any problems, questions or concerns. We can certainly see the patient much sooner if  necessary.  I spent 20 minutes counseling the patient face to face. The total time spent in the appointment was 30 minutes.    Drue Second, MD Medical/Oncology [facility] [phone] (beeper) [phone] (Office)  09/02/2012, 10:33 AM

## 2012-09-04 LAB — HYPERCOAGULABLE PANEL, COMPREHENSIVE
AntiThromb III Func: 112 % (ref 76–126)
Anticardiolipin IgA: 4 APL U/mL (ref <22)
Anticardiolipin IgG: 9 GPL U/mL (ref <23)
Anticardiolipin IgM: 1 MPL U/mL (ref <11)
Beta-2 Glyco I IgG: 0 G Units (ref <20)
Beta-2-Glycoprotein I IgA: 6 A Units (ref <20)
Beta-2-Glycoprotein I IgM: 5 M Units (ref <20)
DRVVT: 41.6 secs (ref <42.9)
Lupus Anticoagulant: NOT DETECTED
PTT Lupus Anticoagulant: 37 secs (ref 28.0–43.0)
Protein C Activity: 121 % (ref 75–133)
Protein C, Total: 71 % — ABNORMAL LOW (ref 72–160)
Protein S Activity: 122 % (ref 69–129)
Protein S Total: 101 % (ref 60–150)

## 2012-09-04 LAB — FERRITIN: Ferritin: 67 ng/mL (ref 22–322)

## 2012-09-11 DIAGNOSIS — F200 Paranoid schizophrenia: Secondary | ICD-10-CM | POA: Diagnosis not present

## 2012-09-19 ENCOUNTER — Ambulatory Visit (INDEPENDENT_AMBULATORY_CARE_PROVIDER_SITE_OTHER): Payer: Medicare Other | Admitting: Family Medicine

## 2012-09-19 ENCOUNTER — Encounter: Payer: Self-pay | Admitting: Family Medicine

## 2012-09-19 VITALS — BP 119/82 | HR 97 | Temp 98.2°F | Resp 16 | Wt 272.0 lb

## 2012-09-19 DIAGNOSIS — J069 Acute upper respiratory infection, unspecified: Secondary | ICD-10-CM

## 2012-09-19 DIAGNOSIS — J029 Acute pharyngitis, unspecified: Secondary | ICD-10-CM

## 2012-09-19 LAB — POCT RAPID STREP A (OFFICE): Rapid Strep A Screen: NEGATIVE

## 2012-09-19 MED ORDER — PHENYLEPH-CPM-DM-APAP 5-2-10-325 MG PO CAPS: 1.0000 | ORAL_CAPSULE | Freq: Four times a day (QID) | ORAL | Status: DC | PRN

## 2012-09-19 MED ORDER — SALINE SPRAY 0.2 % NA SOLN: NASAL | Status: DC

## 2012-09-19 NOTE — Addendum Note (Signed)
Addended by: Mauri Reading on: 09/19/2012 12:15 PM   Modules accepted: Orders

## 2012-09-19 NOTE — Progress Notes (Signed)
OFFICE NOTE  09/19/2012  CC:  Chief Complaint  Patient presents with  . Cough  . Nasal Congestion  . Hoarse  . Sore Throat  . URI     HPI: Patient is a 36 y.o. Caucasian male who is here for uri sx's. Pt presents complaining of respiratory symptoms for 3 days.  Primary symptoms are: ST, hoarse voice, stuffy nose/head, coughing a little.  Worst symptoms seems to be the stuffiness in nose and face.  Lately the symptoms seem to be improving some on alka selzer plus cold med. Pertinent negatives: No fevers, no wheezing, and no SOB.  No pain in face or teeth.  No significant HA.  ST mild at most.   Symptoms made worse by nothing.  Symptoms improved by alka selz cold plus. Smoker? no Recent sick contact? yes Muscle or joint aches? no Flu shot this season at least 2 wks ago? yes  Additional ROS: no n/v/d or abdominal pain.  No rash.  No neck stiffness.   +Mild fatigue.  +Mild appetite loss.   Pertinent PMH:  Past Medical History  Diagnosis Date  . Anemia     post operative  . Hyperlipidemia   . Schizophrenia   . Anxiety   . Obesity   . Cellulitis     bilateral thighs  . Lupus anticoagulant positive 08/15/2011  . Antiphospholipid syndrome 08/15/2011  . Compartment syndrome     MEDS:  Outpatient Prescriptions Prior to Visit  Medication Sig Dispense Refill  . aspirin EC 81 MG tablet Take 1 tablet (81 mg total) by mouth daily.  30 tablet  11  . clonazePAM (KLONOPIN) 1 MG tablet Take 1 mg by mouth 2 (two) times daily. For anxiety      . divalproex (DEPAKOTE) 500 MG EC tablet Take 1,000 mg by mouth at bedtime.       . docusate sodium (COLACE) 100 MG capsule Take 100 mg by mouth 2 (two) times daily.      Marland Kitchen FLUoxetine (PROZAC) 20 MG tablet Take 20 mg by mouth at bedtime.       . folic acid (FOLVITE) 1 MG tablet Take 1 tablet (1 mg total) by mouth daily.  30 tablet  11  . ibuprofen (ADVIL,MOTRIN) 200 MG tablet Take 200 mg by mouth every 6 (six) hours as needed.      . isoniazid  (NYDRAZID) 300 MG tablet take 1 tablet by mouth once daily  30 tablet  3  . metFORMIN (GLUCOPHAGE) 500 MG tablet Take 500 mg by mouth 2 (two) times daily.       . Multiple Vitamin (MULTIVITAMIN) capsule Take 1 capsule by mouth daily.  30 capsule  11  . OLANZapine (ZYPREXA) 20 MG tablet Take 40 mg by mouth at bedtime.      Marland Kitchen RA VITAMIN B-6 50 MG tablet take 1 tablet by mouth once daily  30 each  5  . thiamine 100 MG tablet Take 1 tablet (100 mg total) by mouth daily.  30 tablet  11   Last reviewed on 09/19/2012 11:25 AM by Jeoffrey Massed, MD  PE: Blood pressure 119/82, pulse 97, temperature 98.2 F (36.8 C), temperature source Oral, resp. rate 16, weight 272 lb (123.378 kg), SpO2 93.00%. VS: noted--normal. Gen: alert, NAD, NONTOXIC APPEARING. HEENT: eyes without injection, drainage, or swelling.  Ears: EACs clear, TMs with normal light reflex and landmarks.  Nose: Clear rhinorrhea, with some dried, crusty exudate adherent to mildly injected mucosa.  No purulent d/c.  No paranasal sinus TTP.  No facial swelling.  Throat and mouth without focal lesion.  No pharyngial swelling, erythema, or exudate.   Neck: supple, no LAD.   LUNGS: CTA bilat, nonlabored resps.   CV: RRR, no m/r/g. EXT: no c/c/e SKIN: no rash  LAB: Rapid strep NEG  IMPRESSION AND PLAN:  Viral URI, doing better with symptomatic care.  Continue current symptomatic care and add saline nasal spray. Push fluids, rest.   FOLLOW UP: prn

## 2012-09-19 NOTE — Patient Instructions (Signed)
Buy generic OTC saline nasal spray and use in each nostril 3-4 times per day while you have a cold.

## 2012-09-20 ENCOUNTER — Telehealth: Payer: Self-pay | Admitting: *Deleted

## 2012-09-20 NOTE — Telephone Encounter (Signed)
patient called concern of medications are making him to drowsy during the day also. Tried to return call to get more info at [phone], no answer, left message to return our call here at [facility].

## 2012-09-21 LAB — CULTURE, GROUP A STREP: Organism ID, Bacteria: NORMAL

## 2012-09-27 DIAGNOSIS — F200 Paranoid schizophrenia: Secondary | ICD-10-CM | POA: Diagnosis not present

## 2012-10-11 DIAGNOSIS — F200 Paranoid schizophrenia: Secondary | ICD-10-CM | POA: Diagnosis not present

## 2012-10-14 ENCOUNTER — Ambulatory Visit: Payer: Medicare Other | Admitting: Internal Medicine

## 2012-10-15 ENCOUNTER — Ambulatory Visit (INDEPENDENT_AMBULATORY_CARE_PROVIDER_SITE_OTHER): Payer: Medicare Other | Admitting: Family Medicine

## 2012-10-15 ENCOUNTER — Encounter: Payer: Self-pay | Admitting: Family Medicine

## 2012-10-15 ENCOUNTER — Ambulatory Visit (HOSPITAL_BASED_OUTPATIENT_CLINIC_OR_DEPARTMENT_OTHER)
Admission: RE | Admit: 2012-10-15 | Discharge: 2012-10-15 | Disposition: A | Discharge status: Home / Self Care | Payer: Medicare Other | Source: Ambulatory Visit | Attending: Family Medicine | Admitting: Family Medicine

## 2012-10-15 VITALS — BP 140/98 | HR 88 | Temp 98.4°F | Ht 72.0 in | Wt 268.0 lb

## 2012-10-15 DIAGNOSIS — F259 Schizoaffective disorder, unspecified: Secondary | ICD-10-CM

## 2012-10-15 DIAGNOSIS — R030 Elevated blood-pressure reading, without diagnosis of hypertension: Secondary | ICD-10-CM

## 2012-10-15 DIAGNOSIS — E119 Type 2 diabetes mellitus without complications: Secondary | ICD-10-CM | POA: Diagnosis not present

## 2012-10-15 DIAGNOSIS — R7611 Nonspecific reaction to tuberculin skin test without active tuberculosis: Secondary | ICD-10-CM

## 2012-10-15 DIAGNOSIS — R52 Pain, unspecified: Secondary | ICD-10-CM | POA: Diagnosis not present

## 2012-10-15 DIAGNOSIS — IMO0001 Reserved for inherently not codable concepts without codable children: Secondary | ICD-10-CM

## 2012-10-15 MED ORDER — TROLAMINE SALICYLATE 10 % EX CREA: TOPICAL_CREAM | CUTANEOUS | Status: DC | PRN

## 2012-10-15 NOTE — Patient Instructions (Addendum)
Annual exam with labs in roughly 4 months  Costochondritis Your exam shows you have symptoms and findings of costochondritis. This condition causes pain and tenderness over the rib cartilages. It is a common cause of chest pain in both children and adults. It affects women more frequently than men. The symptoms are from irritation (inflammation) of the rib cartilages. The cause is unknown. Chest pain from costochondritis is often made worse by deep breathing, coughing, and movement. Usually there is a tender area over the front of the chest. Chest pain may also be from more serious conditions such as heart disease, infections, blood clots, pleurisy, and minor injuries. Depending on the nature of your pain, your age, and other risk factors, additional medical evaluation may be needed to find the cause of your pain. Costochondritis is a self-limited condition. This means it will improve on its own without specific treatment. You should rest for the next 2 to 3 days. You can then resume full activity as the pain improves.  Anti-inflammatory drugs or pain medicine may be needed to provide relief. See your caregiver if your pain lasts longer than 1 to 2 weeks.  SEEK IMMEDIATE MEDICAL CARE IF:  You develop increased pain or pain that radiates into the back, arms, neck, or jaw.  You develop shortness of breath, productive cough, or you are coughing up blood.  You develop a fever, chills, general weakness, vomiting, or fainting. Document Released: 09/11/2005 Document Revised: 12/04/2011 Document Reviewed: 03/06/2007 [facility] Patient Information 2013 [place], [place].

## 2012-10-17 NOTE — Progress Notes (Signed)
Joseph [name] at [facility] was informed and a copy of results and order faxed

## 2012-10-20 ENCOUNTER — Encounter: Payer: Self-pay | Admitting: Family Medicine

## 2012-10-20 DIAGNOSIS — I10 Essential (primary) hypertension: Secondary | ICD-10-CM | POA: Insufficient documentation

## 2012-10-20 DIAGNOSIS — IMO0001 Reserved for inherently not codable concepts without codable children: Secondary | ICD-10-CM

## 2012-10-20 HISTORY — DX: Reserved for inherently not codable concepts without codable children: IMO0001

## 2012-10-20 NOTE — Progress Notes (Signed)
Patient ID: Joseph [name], male   DOB: 1976-02-05, 37 y.o.   MRN: [id number] TRINDON DORTON [id number] 1975-12-15 10/20/2012      Progress Note-Follow Up  Subjective  Chief Complaint  Chief Complaint  Patient presents with  . Follow-up    HPI  Patient is a 37 year old Caucasian male in today with the aid from his group home. History well. He is almost at the end of the years with INH and vitamin B6 secondary to a positive PPD last year. They deny any cough or fevers. No chest pain or palpitations. No recent illness, palpitations, shortness of breath, headaches, GI or GU complaints. Her blood sugars had been adequately controlled recently. He is taking his medications and behavioral issues are well controlled at this time.  Past Medical History  Diagnosis Date  . Anemia     post operative  . Hyperlipidemia   . Schizophrenia   . Anxiety   . Obesity   . Cellulitis     bilateral thighs  . Lupus anticoagulant positive 08/15/2011  . Antiphospholipid syndrome 08/15/2011  . Compartment syndrome   . Elevated BP 10/20/2012    Past Surgical History  Procedure Date  . I&d extremity     left leg  . Tonsillectomy   . Wisdom tooth extraction   . Leg surgery     Family History  Problem Relation Age of Onset  . GER disease Father   . Diverticulosis Father   . Colon cancer Neg Hx     History   Social History  . Marital Status: Single    Spouse Name: N/A    Number of Children: 0  . Years of Education: N/A   Occupational History  .  [employer]  .      [employer]   Social History Main Topics  . Smoking status: Former Smoker    Types: Cigarettes    Start date: 07/15/2012  . Smokeless tobacco: Former Neurosurgeon    Types: Chew     Comment: smoked for a couple of months  . 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 Drug Use: No  . Sexually Active: Not Currently   Other Topics Concern  . Not on file   Social  History Narrative  . No narrative on file    Current Outpatient Prescriptions on File Prior to Visit  Medication Sig Dispense Refill  . aspirin EC 81 MG tablet Take 1 tablet (81 mg total) by mouth daily.  30 tablet  11  . clonazePAM (KLONOPIN) 1 MG tablet Take 1 mg by mouth 2 (two) times daily as needed. For anxiety      . divalproex (DEPAKOTE) 500 MG EC tablet Take 1,000 mg by mouth at bedtime.       . docusate sodium (COLACE) 100 MG capsule Take 100 mg by mouth 2 (two) times daily.      Marland Kitchen FLUoxetine (PROZAC) 20 MG tablet Take 20 mg by mouth at bedtime.       Marland Kitchen ibuprofen (ADVIL,MOTRIN) 200 MG tablet Take 200 mg by mouth every 6 (six) hours as needed.      . isoniazid (NYDRAZID) 300 MG tablet take 1 tablet by mouth once daily  30 tablet  3  . metFORMIN (GLUCOPHAGE) 500 MG tablet Take 500 mg by mouth 2 (two) times daily.       . Multiple Vitamin (  MULTIVITAMIN) capsule Take 1 capsule by mouth daily.  30 capsule  11  . OLANZapine (ZYPREXA) 20 MG tablet Take 40 mg by mouth at bedtime.      Marland Kitchen RA VITAMIN B-6 50 MG tablet take 1 tablet by mouth once daily  30 each  5    Allergies  Allergen Reactions  . Haldol (Haloperidol Decanoate)     Painful   . Statins     ? Related to compartment syndrome    Review of Systems  Review of Systems  Constitutional: Negative for fever and malaise/fatigue.  HENT: Negative for congestion.   Eyes: Negative for discharge.  Respiratory: Negative for shortness of breath.   Cardiovascular: Negative for chest pain, palpitations and leg swelling.  Gastrointestinal: Negative for nausea, abdominal pain and diarrhea.  Genitourinary: Negative for dysuria.  Musculoskeletal: Negative for falls.  Skin: Negative for rash.  Neurological: Negative for loss of consciousness and headaches.  Endo/Heme/Allergies: Negative for polydipsia.  Psychiatric/Behavioral: Negative for depression and suicidal ideas. The patient is not nervous/anxious and does not have insomnia.      Objective  BP 140/98  Pulse 88  Temp 98.4 F (36.9 C) (Oral)  Ht 6' (1.829 m)  Wt 268 lb (121.564 kg)  BMI 36.35 kg/m2  SpO2 96%  Physical Exam  Physical Exam  Constitutional: He is oriented to person, place, and time and well-developed, well-nourished, and in no distress. No distress.  HENT:  Head: Normocephalic and atraumatic.  Eyes: Conjunctivae normal are normal.  Neck: Neck supple. No thyromegaly present.  Cardiovascular: Normal rate, regular rhythm and normal heart sounds.   No murmur heard. Pulmonary/Chest: Effort normal and breath sounds normal. No respiratory distress.  Abdominal: He exhibits no distension and no mass. There is no tenderness.  Musculoskeletal: He exhibits no edema.  Neurological: He is alert and oriented to person, place, and time.  Skin: Skin is warm.  Psychiatric: Memory, affect and judgment normal.    Lab Results  Component Value Date   TSH 1.216 11/04/2011   Lab Results  Component Value Date   WBC 7.1 06/21/2012   HGB 14.9 06/21/2012   HCT 43.5 06/21/2012   MCV 91.0 06/21/2012   PLT 275 06/21/2012   Lab Results  Component Value Date   CREATININE 0.77 06/21/2012   BUN 11 06/21/2012   NA 139 06/21/2012   K 4.3 06/21/2012   CL 104 06/21/2012   CO2 28 06/21/2012   Lab Results  Component Value Date   ALT 59* 08/16/2012   AST 35 08/16/2012   ALKPHOS 60 08/16/2012   BILITOT 0.2* 08/16/2012   Lab Results  Component Value Date   CHOL 171 06/21/2012   Lab Results  Component Value Date   HDL 35* 06/21/2012   Lab Results  Component Value Date   LDLCALC 85 06/21/2012   Lab Results  Component Value Date   TRIG 254* 06/21/2012   Lab Results  Component Value Date   CHOLHDL 4.9 06/21/2012     Assessment & Plan  Positive PPD On February 8 it will be a year since he started INH, may discontinue at that time. His CXR is normal and he is asymptomatic. Will need annual CXR for his group home but will no longer be able to get  ppd.  Diabetes mellitus Generally good control, minimize simple carbs, continue current meds.  Schizoaffective disorder Lives at a group home and is accompanied by aide today. Doing a good job of taking his medications.  Elevated  BP Minimize sodium and we will recheck at next visit.

## 2012-10-20 NOTE — Assessment & Plan Note (Signed)
Generally good control, minimize simple carbs, continue current meds.

## 2012-10-20 NOTE — Assessment & Plan Note (Signed)
Minimize sodium and we will recheck at next visit.

## 2012-10-20 NOTE — Assessment & Plan Note (Addendum)
On February 8 it will be a year since he started INH, may discontinue at that time. His CXR is normal and he is asymptomatic. Will need annual CXR for his group home but will no longer be able to get ppd.

## 2012-10-20 NOTE — Assessment & Plan Note (Signed)
Lives at a group home and is accompanied by aide today. Doing a good job of taking his medications.

## 2012-10-23 ENCOUNTER — Telehealth: Payer: Self-pay | Admitting: Internal Medicine

## 2012-10-23 DIAGNOSIS — R52 Pain, unspecified: Secondary | ICD-10-CM

## 2012-10-23 MED ORDER — TROLAMINE SALICYLATE 10 % EX CREA: TOPICAL_CREAM | CUTANEOUS | Status: DC | PRN

## 2012-10-23 NOTE — Telephone Encounter (Signed)
Joseph [name] from the group home called stating that even though Dr. Abner Greenspan called in an Rx for aspercreme, the group home still needs a written Rx faxed to them. Please fax Rx to [phone]

## 2012-10-23 NOTE — Telephone Encounter (Signed)
Faxed

## 2012-10-25 DIAGNOSIS — F200 Paranoid schizophrenia: Secondary | ICD-10-CM | POA: Diagnosis not present

## 2012-11-01 DIAGNOSIS — Q828 Other specified congenital malformations of skin: Secondary | ICD-10-CM | POA: Diagnosis not present

## 2012-11-13 DIAGNOSIS — F200 Paranoid schizophrenia: Secondary | ICD-10-CM | POA: Diagnosis not present

## 2012-11-22 DIAGNOSIS — F200 Paranoid schizophrenia: Secondary | ICD-10-CM | POA: Diagnosis not present

## 2012-11-25 DIAGNOSIS — F200 Paranoid schizophrenia: Secondary | ICD-10-CM | POA: Diagnosis not present

## 2012-12-03 ENCOUNTER — Ambulatory Visit (INDEPENDENT_AMBULATORY_CARE_PROVIDER_SITE_OTHER): Payer: Medicare Other | Admitting: Sports Medicine

## 2012-12-03 ENCOUNTER — Encounter: Payer: Self-pay | Admitting: Sports Medicine

## 2012-12-03 VITALS — BP 110/76 | HR 86 | Ht 72.0 in | Wt 268.0 lb

## 2012-12-03 DIAGNOSIS — T799XXS Unspecified early complication of trauma, sequela: Secondary | ICD-10-CM | POA: Diagnosis not present

## 2012-12-03 DIAGNOSIS — T79A0XS Compartment syndrome, unspecified, sequela: Secondary | ICD-10-CM

## 2012-12-03 DIAGNOSIS — M21372 Foot drop, left foot: Secondary | ICD-10-CM | POA: Insufficient documentation

## 2012-12-03 DIAGNOSIS — M21371 Foot drop, right foot: Secondary | ICD-10-CM | POA: Insufficient documentation

## 2012-12-03 DIAGNOSIS — M216X9 Other acquired deformities of unspecified foot: Secondary | ICD-10-CM

## 2012-12-03 NOTE — Progress Notes (Signed)
  Subjective:    Patient ID: Joseph [name], male    DOB: [birth year], 37 y.o.   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 Note his compartment syndrome resulted in bilateral  foot drop   He presents today requesting information about better AFO. Patient denies any symptoms and reports walking 1 mile a day. Patient prefers AFOs that would allow him to wear his regular shoe size.   Patient lives in a group home He is brought by one of the assistants at the home who is very knowledgeable about his health conditions  Past medical history is significant for diabetes and a series of problems as noted in his problem list   Review of Systems     Objective:   Physical Exam NAD Leg: Surgery scars on three leg compartments evident bilaterally. No swelling or redness. 1+ patella and achilles tendon reflexes. NTP. Ankle Dorsiflexion absent bilaterally.  Plantar flexion with inversion present bilaterally.  He has mildly decreased strength to eversion and plantar flexion bilaterally Inversion strength mildly decreased  Walking `gait with foot inversion bilaterally and with obvious foot drop He is able to compensate for this        Assessment & Plan:  Patient is a 37 yo male with a history of bilateral leg compartment syndrome s/p surgery in 2012 with resultant bilateral foot drops who currently uses AFOs which make his shoe size a 15. Patient would like to change AFOs and requested information about his options. Patient currently walks one mile a day while wearing his AFOs.  P Patient tested a sample Toe-off Alland AFO on the L foot compared to his current AFO on the R foot.. Though the sample AFO was not fitted to patient, the left foot showed improved foot control compared to the right. patinet will be fitted for a new AFO which  will provide improved gait and enable patient to wear his regular size shoes.

## 2012-12-03 NOTE — Assessment & Plan Note (Signed)
This left him with chronic weakness and with a foot drop in bilateral lower extremities

## 2012-12-03 NOTE — Assessment & Plan Note (Signed)
I think he is an excellent candidate for a lighter weight AFOs with an anterior tibial compression  This clearly improves his gait  We need to be able to keep him on a good walking program to lessen his risk of complications from his diabetes  I think this intervention would be helpful

## 2012-12-06 DIAGNOSIS — F200 Paranoid schizophrenia: Secondary | ICD-10-CM | POA: Diagnosis not present

## 2012-12-20 DIAGNOSIS — F200 Paranoid schizophrenia: Secondary | ICD-10-CM | POA: Diagnosis not present

## 2012-12-23 DIAGNOSIS — E7800 Pure hypercholesterolemia, unspecified: Secondary | ICD-10-CM | POA: Diagnosis not present

## 2012-12-23 DIAGNOSIS — E119 Type 2 diabetes mellitus without complications: Secondary | ICD-10-CM | POA: Diagnosis not present

## 2012-12-31 ENCOUNTER — Encounter: Payer: Self-pay | Admitting: Family Medicine

## 2012-12-31 ENCOUNTER — Other Ambulatory Visit: Payer: Self-pay | Admitting: Family Medicine

## 2012-12-31 ENCOUNTER — Ambulatory Visit (INDEPENDENT_AMBULATORY_CARE_PROVIDER_SITE_OTHER): Payer: Medicare Other | Admitting: Family Medicine

## 2012-12-31 VITALS — BP 118/78 | HR 84 | Temp 97.9°F | Ht 72.0 in | Wt 254.0 lb

## 2012-12-31 DIAGNOSIS — E785 Hyperlipidemia, unspecified: Secondary | ICD-10-CM | POA: Diagnosis not present

## 2012-12-31 DIAGNOSIS — E669 Obesity, unspecified: Secondary | ICD-10-CM

## 2012-12-31 DIAGNOSIS — K5900 Constipation, unspecified: Secondary | ICD-10-CM

## 2012-12-31 DIAGNOSIS — R7611 Nonspecific reaction to tuberculin skin test without active tuberculosis: Secondary | ICD-10-CM

## 2012-12-31 DIAGNOSIS — R569 Unspecified convulsions: Secondary | ICD-10-CM

## 2012-12-31 DIAGNOSIS — R109 Unspecified abdominal pain: Secondary | ICD-10-CM | POA: Diagnosis not present

## 2012-12-31 DIAGNOSIS — E119 Type 2 diabetes mellitus without complications: Secondary | ICD-10-CM

## 2012-12-31 DIAGNOSIS — IMO0001 Reserved for inherently not codable concepts without codable children: Secondary | ICD-10-CM

## 2012-12-31 DIAGNOSIS — R748 Abnormal levels of other serum enzymes: Secondary | ICD-10-CM

## 2012-12-31 DIAGNOSIS — G4733 Obstructive sleep apnea (adult) (pediatric): Secondary | ICD-10-CM

## 2012-12-31 DIAGNOSIS — T79A0XD Compartment syndrome, unspecified, subsequent encounter: Secondary | ICD-10-CM

## 2012-12-31 DIAGNOSIS — R030 Elevated blood-pressure reading, without diagnosis of hypertension: Secondary | ICD-10-CM

## 2012-12-31 DIAGNOSIS — Z5189 Encounter for other specified aftercare: Secondary | ICD-10-CM

## 2012-12-31 DIAGNOSIS — D509 Iron deficiency anemia, unspecified: Secondary | ICD-10-CM

## 2012-12-31 DIAGNOSIS — Z0000 Encounter for general adult medical examination without abnormal findings: Secondary | ICD-10-CM

## 2012-12-31 DIAGNOSIS — R079 Chest pain, unspecified: Secondary | ICD-10-CM

## 2012-12-31 MED ORDER — MULTIVITAMINS PO CAPS: 1.0000 | ORAL_CAPSULE | Freq: Every day | ORAL | Status: DC

## 2012-12-31 MED ORDER — ASPIRIN EC 81 MG PO TBEC: 81.0000 mg | DELAYED_RELEASE_TABLET | Freq: Every day | ORAL | Status: DC

## 2012-12-31 MED ORDER — IBUPROFEN 200 MG PO TABS: 200.0000 mg | ORAL_TABLET | Freq: Four times a day (QID) | ORAL | Status: DC | PRN

## 2012-12-31 MED ORDER — DOCUSATE SODIUM 100 MG PO CAPS: 100.0000 mg | ORAL_CAPSULE | Freq: Two times a day (BID) | ORAL | Status: DC

## 2012-12-31 NOTE — Assessment & Plan Note (Signed)
Tolerating Metformin, doing a great job with exercise and diet changes.

## 2012-12-31 NOTE — Assessment & Plan Note (Signed)
Improved repeat check today

## 2012-12-31 NOTE — Patient Instructions (Addendum)
Release of Record Dr Talmage Nap endocrinology last clinic note and [id number], labs    Preventive Care for Adults, Male A healthy lifestyle and preventive care can promote health and wellness. Preventive health guidelines for men include the following key practices:  A routine yearly physical is a good way to check with your caregiver about your health and preventative screening. It is a chance to share any concerns and updates on your health, and to receive a thorough exam.  Visit your dentist for a routine exam and preventative care every 6 months. Brush your teeth twice a day and floss once a day. Good oral hygiene prevents tooth decay and gum disease.  The frequency of eye exams is based on your age, health, family medical history, use of contact lenses, and other factors. Follow your caregiver's recommendations for frequency of eye exams.  Eat a healthy diet. Foods like vegetables, fruits, whole grains, low-fat dairy products, and lean protein foods contain the nutrients you need without too many calories. Decrease your intake of foods high in solid fats, added sugars, and salt. Eat the right amount of calories for you.Get information about a proper diet from your caregiver, if necessary.  Regular physical exercise is one of the most important things you can do for your health. Most adults should get at least 150 minutes of moderate-intensity exercise (any activity that increases your heart rate and causes you to sweat) each week. In addition, most adults need muscle-strengthening exercises on 2 or more days a week.  Maintain a healthy weight. The body mass index (BMI) is a screening tool to identify possible weight problems. It provides an estimate of body fat based on height and weight. Your caregiver can help determine your BMI, and can help you achieve or maintain a healthy weight.For adults 20 years and older:  A BMI below 18.5 is considered underweight.  A BMI of 18.5 to 24.9 is normal.  A  BMI of 25 to 29.9 is considered overweight.  A BMI of 30 and above is considered obese.  Maintain normal blood lipids and cholesterol levels by exercising and minimizing your intake of saturated fat. Eat a balanced diet with plenty of fruit and vegetables. Blood tests for lipids and cholesterol should begin at age 46 and be repeated every 5 years. If your lipid or cholesterol levels are high, you are over 50, or you are a high risk for heart disease, you may need your cholesterol levels checked more frequently.Ongoing high lipid and cholesterol levels should be treated with medicines if diet and exercise are not effective.  If you smoke, find out from your caregiver how to quit. If you do not use tobacco, do not start.  If you choose to drink alcohol, do not exceed 2 drinks per day. One drink is considered to be 12 ounces (355 mL) of beer, 5 ounces (148 mL) of wine, or 1.5 ounces (44 mL) of liquor.  Avoid use of street drugs. Do not share needles with anyone. Ask for help if you need support or instructions about stopping the use of drugs.  High blood pressure causes heart disease and increases the risk of stroke. Your blood pressure should be checked at least every 1 to 2 years. Ongoing high blood pressure should be treated with medicines, if weight loss and exercise are not effective.  If you are 34 to 39 years old, ask your caregiver if you should take aspirin to prevent heart disease.  Diabetes screening involves taking a blood sample  to check your fasting blood sugar level. This should be done once every 3 years, after age 108, if you are within normal weight and without risk factors for diabetes. Testing should be considered at a younger age or be carried out more frequently if you are overweight and have at least 1 risk factor for diabetes.  Colorectal cancer can be detected and often prevented. Most routine colorectal cancer screening begins at the age of 76 and continues through age 32.  However, your caregiver may recommend screening at an earlier age if you have risk factors for colon cancer. On a yearly basis, your caregiver may provide home test kits to check for hidden blood in the stool. Use of a small camera at the end of a tube, to directly examine the colon (sigmoidoscopy or colonoscopy), can detect the earliest forms of colorectal cancer. Talk to your caregiver about this at age 41, when routine screening begins. Direct examination of the colon should be repeated every 5 to 10 years through age 35, unless early forms of pre-cancerous polyps or small growths are found.  Hepatitis C blood testing is recommended for all people born from [birth year] through 1965 and any individual with known risks for hepatitis C.  Practice safe sex. Use condoms and avoid high-risk sexual practices to reduce the spread of sexually transmitted infections (STIs). STIs include gonorrhea, chlamydia, syphilis, trichomonas, herpes, HPV, and human immunodeficiency virus (HIV). Herpes, HIV, and HPV are viral illnesses that have no cure. They can result in disability, cancer, and death.  A one-time screening for abdominal aortic aneurysm (AAA) and surgical repair of large AAAs by sound wave imaging (ultrasonography) is recommended for ages 57 to 24 years who are current or former smokers.  Healthy men should no longer receive prostate-specific antigen (PSA) blood tests as part of routine cancer screening. Consult with your caregiver about prostate cancer screening.  Testicular cancer screening is not recommended for adult males who have no symptoms. Screening includes self-exam, caregiver exam, and other screening tests. Consult with your caregiver about any symptoms you have or any concerns you have about testicular cancer.  Use sunscreen with skin protection factor (SPF) of 30 or more. Apply sunscreen liberally and repeatedly throughout the day. You should seek shade when your shadow is shorter than you.  Protect yourself by wearing long sleeves, pants, a wide-brimmed hat, and sunglasses year round, whenever you are outdoors.  Once a month, do a whole body skin exam, using a mirror to look at the skin on your back. Notify your caregiver of new moles, moles that have irregular borders, moles that are larger than a pencil eraser, or moles that have changed in shape or color.  Stay current with required immunizations.  Influenza. You need a dose every fall (or winter). The composition of the flu vaccine changes each year, so being vaccinated once is not enough.  Pneumococcal polysaccharide. You need 1 to 2 doses if you smoke cigarettes or if you have certain chronic medical conditions. You need 1 dose at age 20 (or older) if you have never been vaccinated.  Tetanus, diphtheria, pertussis (Tdap, Td). Get 1 dose of Tdap vaccine if you are younger than age 63 years, are over 23 and have contact with an infant, are a Research scientist (physical sciences), or simply want to be protected from whooping cough. After that, you need a Td booster dose every 10 years. Consult your caregiver if you have not had at least 3 tetanus and diphtheria-containing shots sometime in  your life or have a deep or dirty wound.  HPV. This vaccine is recommended for males 13 through 39 years of age. This vaccine may be given to men 22 through 39 years of age who have not completed the 3 dose series. It is recommended for men through age 69 who have sex with men or whose immune system is weakened because of HIV infection, other illness, or medications. The vaccine is given in 3 doses over 6 months.  Measles, mumps, rubella (MMR). You need at least 1 dose of MMR if you were born in 1957 or later. You may also need a 2nd dose.  Meningococcal. If you are age 57 to 27 years and a Orthoptist living in a residence hall, or have one of several medical conditions, you need to get vaccinated against meningococcal disease. You may also need  additional booster doses.  Zoster (shingles). If you are age 43 years or older, you should get this vaccine.  Varicella (chickenpox). If you have never had chickenpox or you were vaccinated but received only 1 dose, talk to your caregiver to find out if you need this vaccine.  Hepatitis A. You need this vaccine if you have a specific risk factor for hepatitis A virus infection, or you simply wish to be protected from this disease. The vaccine is usually given as 2 doses, 6 to 18 months apart.  Hepatitis B. You need this vaccine if you have a specific risk factor for hepatitis B virus infection or you simply wish to be protected from this disease. The vaccine is given in 3 doses, usually over 6 months. Preventative Service / Frequency Ages 73 to 79  Blood pressure check.** / Every 1 to 2 years.  Lipid and cholesterol check.** / Every 5 years beginning at age 69.  Hepatitis C blood test.** / For any individual with known risks for hepatitis C.  Skin self-exam. / Monthly.  Influenza immunization.** / Every year.  Pneumococcal polysaccharide immunization.** / 1 to 2 doses if you smoke cigarettes or if you have certain chronic medical conditions.  Tetanus, diphtheria, pertussis (Tdap,Td) immunization. / A one-time dose of Tdap vaccine. After that, you need a Td booster dose every 10 years.  HPV immunization. / 3 doses over 6 months, if 26 and younger.  Measles, mumps, rubella (MMR) immunization. / You need at least 1 dose of MMR if you were born in 1957 or later. You may also need a 2nd dose.  Meningococcal immunization. / 1 dose if you are age 11 to 18 years and a Orthoptist living in a residence hall, or have one of several medical conditions, you need to get vaccinated against meningococcal disease. You may also need additional booster doses.  Varicella immunization.** / Consult your caregiver.  Hepatitis A immunization.** / Consult your caregiver. 2 doses, 6 to 18  months apart.  Hepatitis B immunization.** / Consult your caregiver. 3 doses usually over 6 months. Ages 58 to 77  Blood pressure check.** / Every 1 to 2 years.  Lipid and cholesterol check.** / Every 5 years beginning at age 25.  Fecal occult blood test (FOBT) of stool. / Every year beginning at age 63 and continuing until age 46. You may not have to do this test if you get colonoscopy every 10 years.  Flexible sigmoidoscopy** or colonoscopy.** / Every 5 years for a flexible sigmoidoscopy or every 10 years for a colonoscopy beginning at age 62 and continuing until age 11.  Hepatitis C blood test.** / For all people born from [birth year] through 1965 and any individual with known risks for hepatitis C.  Skin self-exam. / Monthly.  Influenza immunization.** / Every year.  Pneumococcal polysaccharide immunization.** / 1 to 2 doses if you smoke cigarettes or if you have certain chronic medical conditions.  Tetanus, diphtheria, pertussis (Tdap/Td) immunization.** / A one-time dose of Tdap vaccine. After that, you need a Td booster dose every 10 years.  Measles, mumps, rubella (MMR) immunization. / You need at least 1 dose of MMR if you were born in 1957 or later. You may also need a 2nd dose.  Varicella immunization.**/ Consult your caregiver.  Meningococcal immunization.** / Consult your caregiver.  Hepatitis A immunization.** / Consult your caregiver. 2 doses, 6 to 18 months apart.  Hepatitis B immunization.** / Consult your caregiver. 3 doses, usually over 6 months. Ages 38 and over  Blood pressure check.** / Every 1 to 2 years.  Lipid and cholesterol check.**/ Every 5 years beginning at age 2.  Fecal occult blood test (FOBT) of stool. / Every year beginning at age 33 and continuing until age 75. You may not have to do this test if you get colonoscopy every 10 years.  Flexible sigmoidoscopy** or colonoscopy.** / Every 5 years for a flexible sigmoidoscopy or every 10 years for a  colonoscopy beginning at age 14 and continuing until age 32.  Hepatitis C blood test.** / For all people born from [birth year] through 1965 and any individual with known risks for hepatitis C.  Abdominal aortic aneurysm (AAA) screening.** / A one-time screening for ages 39 to 15 years who are current or former smokers.  Skin self-exam. / Monthly.  Influenza immunization.** / Every year.  Pneumococcal polysaccharide immunization.** / 1 dose at age 58 (or older) if you have never been vaccinated.  Tetanus, diphtheria, pertussis (Tdap, Td) immunization. / A one-time dose of Tdap vaccine if you are over 65 and have contact with an infant, are a Research scientist (physical sciences), or simply want to be protected from whooping cough. After that, you need a Td booster dose every 10 years.  Varicella immunization. ** / Consult your caregiver.  Meningococcal immunization.** / Consult your caregiver.  Hepatitis A immunization. ** / Consult your caregiver. 2 doses, 6 to 18 months apart.  Hepatitis B immunization.** / Check with your caregiver. 3 doses, usually over 6 months. **Family history and personal history of risk and conditions may change your caregiver's recommendations. Document Released: 11/07/2001 Document Revised: 12/04/2011 Document Reviewed: 02/06/2011 [facility] Patient Information 2013 [place], [place].

## 2012-12-31 NOTE — Assessment & Plan Note (Signed)
Resolved with most recent labs

## 2012-12-31 NOTE — Assessment & Plan Note (Signed)
Resolved

## 2012-12-31 NOTE — Assessment & Plan Note (Signed)
Has finished INH treatment and had a negative CXR. asymptomatic

## 2012-12-31 NOTE — Assessment & Plan Note (Signed)
Well controlled today, no changes today
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
## 2013-01-01 ENCOUNTER — Telehealth: Payer: Self-pay

## 2013-01-01 LAB — LIPID PANEL
Cholesterol: 170 mg/dL (ref 0–200)
HDL: 45 mg/dL (ref >39)
LDL Cholesterol: 100 mg/dL — ABNORMAL HIGH (ref 0–99)
Total CHOL/HDL Ratio: 3.8 Ratio
Triglycerides: 125 mg/dL (ref <150)
VLDL: 25 mg/dL (ref 0–40)

## 2013-01-01 LAB — URINALYSIS
Bilirubin Urine: NEGATIVE
Glucose, UA: NEGATIVE mg/dL
Hgb urine dipstick: NEGATIVE
Ketones, ur: NEGATIVE mg/dL
Leukocytes, UA: NEGATIVE
Nitrite: NEGATIVE
Protein, ur: NEGATIVE mg/dL
Specific Gravity, Urine: 1.005 (ref 1.005–1.030)
Urobilinogen, UA: 0.2 mg/dL (ref 0.0–1.0)
pH: 7.5 (ref 5.0–8.0)

## 2013-01-01 LAB — HEPATIC FUNCTION PANEL
ALT: 34 U/L (ref 0–53)
AST: 20 U/L (ref 0–37)
Albumin: 4.4 g/dL (ref 3.5–5.2)
Alkaline Phosphatase: 59 U/L (ref 39–117)
Bilirubin, Direct: 0.1 mg/dL (ref 0.0–0.3)
Indirect Bilirubin: 0.4 mg/dL (ref 0.0–0.9)
Total Bilirubin: 0.5 mg/dL (ref 0.3–1.2)
Total Protein: 6.9 g/dL (ref 6.0–8.3)

## 2013-01-01 LAB — BASIC METABOLIC PANEL
BUN: 11 mg/dL (ref 6–23)
CO2: 30 mEq/L (ref 19–32)
Calcium: 9.6 mg/dL (ref 8.4–10.5)
Chloride: 103 mEq/L (ref 96–112)
Creat: 0.9 mg/dL (ref 0.50–1.35)
Glucose, Bld: 74 mg/dL (ref 70–99)
Potassium: 4.6 mEq/L (ref 3.5–5.3)
Sodium: 138 mEq/L (ref 135–145)

## 2013-01-01 LAB — CBC
HCT: 44.1 % (ref 39.0–52.0)
Hemoglobin: 14.8 g/dL (ref 13.0–17.0)
MCH: 30.8 pg (ref 26.0–34.0)
MCHC: 33.6 g/dL (ref 30.0–36.0)
MCV: 91.9 fL (ref 78.0–100.0)
Platelets: 278 10*3/uL (ref 150–400)
RBC: 4.8 MIL/uL (ref 4.22–5.81)
RDW: 13.3 % (ref 11.5–15.5)
WBC: 6.3 10*3/uL (ref 4.0–10.5)

## 2013-01-01 LAB — HEMOGLOBIN A1C
Hgb A1c MFr Bld: 5.9 % — ABNORMAL HIGH (ref <5.7)
Mean Plasma Glucose: 123 mg/dL — ABNORMAL HIGH (ref <117)

## 2013-01-01 LAB — VALPROIC ACID LEVEL: Valproic Acid Lvl: 34.1 ug/mL — ABNORMAL LOW (ref 50.0–100.0)

## 2013-01-01 LAB — PHOSPHORUS: Phosphorus: 3.1 mg/dL (ref 2.3–4.6)

## 2013-01-01 NOTE — Telephone Encounter (Signed)
Katrina informed and is calling to see if this can be added.  Katrina will call us back

## 2013-01-01 NOTE — Telephone Encounter (Signed)
Joseph [name] called back and stated this is being added

## 2013-01-01 NOTE — Telephone Encounter (Signed)
Message copied by Court [name] on Wed Jan 01, 2013  9:13 AM ------      Message from: Danise Edge A      Created: Tue Dec 31, 2012  9:45 PM       Can you see if she can add hgba1c to his labs for diabetes? Thanks            SAB ------

## 2013-01-02 ENCOUNTER — Encounter: Payer: Self-pay | Admitting: *Deleted

## 2013-01-03 ENCOUNTER — Telehealth: Payer: Self-pay

## 2013-01-03 DIAGNOSIS — F200 Paranoid schizophrenia: Secondary | ICD-10-CM | POA: Diagnosis not present

## 2013-01-03 NOTE — Telephone Encounter (Signed)
Faxed to [facility] at [phone] per request

## 2013-01-07 LAB — HM DIABETES EYE EXAM: HM Diabetic Eye Exam: NORMAL

## 2013-01-16 DIAGNOSIS — E119 Type 2 diabetes mellitus without complications: Secondary | ICD-10-CM | POA: Diagnosis not present

## 2013-01-17 DIAGNOSIS — F200 Paranoid schizophrenia: Secondary | ICD-10-CM | POA: Diagnosis not present

## 2013-01-23 ENCOUNTER — Other Ambulatory Visit: Payer: Self-pay | Admitting: Internal Medicine

## 2013-02-05 DIAGNOSIS — F200 Paranoid schizophrenia: Secondary | ICD-10-CM | POA: Diagnosis not present

## 2013-02-11 ENCOUNTER — Ambulatory Visit: Payer: Medicare Other | Admitting: Family Medicine

## 2013-02-28 DIAGNOSIS — F200 Paranoid schizophrenia: Secondary | ICD-10-CM | POA: Diagnosis not present

## 2013-03-18 DIAGNOSIS — F200 Paranoid schizophrenia: Secondary | ICD-10-CM | POA: Diagnosis not present

## 2013-04-03 ENCOUNTER — Other Ambulatory Visit: Payer: Self-pay

## 2013-04-11 DIAGNOSIS — F200 Paranoid schizophrenia: Secondary | ICD-10-CM | POA: Diagnosis not present

## 2013-05-02 DIAGNOSIS — F200 Paranoid schizophrenia: Secondary | ICD-10-CM | POA: Diagnosis not present

## 2013-05-16 DIAGNOSIS — F200 Paranoid schizophrenia: Secondary | ICD-10-CM | POA: Diagnosis not present

## 2013-05-30 DIAGNOSIS — F200 Paranoid schizophrenia: Secondary | ICD-10-CM | POA: Diagnosis not present

## 2013-06-01 DIAGNOSIS — Z23 Encounter for immunization: Secondary | ICD-10-CM | POA: Diagnosis not present

## 2013-06-02 DIAGNOSIS — F200 Paranoid schizophrenia: Secondary | ICD-10-CM | POA: Diagnosis not present

## 2013-06-12 DIAGNOSIS — Z79899 Other long term (current) drug therapy: Secondary | ICD-10-CM | POA: Diagnosis not present

## 2013-06-13 DIAGNOSIS — F200 Paranoid schizophrenia: Secondary | ICD-10-CM | POA: Diagnosis not present

## 2013-06-23 DIAGNOSIS — E7800 Pure hypercholesterolemia, unspecified: Secondary | ICD-10-CM | POA: Diagnosis not present

## 2013-06-23 DIAGNOSIS — E119 Type 2 diabetes mellitus without complications: Secondary | ICD-10-CM | POA: Diagnosis not present

## 2013-06-27 DIAGNOSIS — F200 Paranoid schizophrenia: Secondary | ICD-10-CM | POA: Diagnosis not present

## 2013-06-30 ENCOUNTER — Ambulatory Visit: Payer: Medicare Other | Admitting: Family Medicine

## 2013-06-30 ENCOUNTER — Telehealth: Payer: Self-pay | Admitting: Internal Medicine

## 2013-06-30 DIAGNOSIS — Z23 Encounter for immunization: Secondary | ICD-10-CM | POA: Diagnosis not present

## 2013-06-30 DIAGNOSIS — E119 Type 2 diabetes mellitus without complications: Secondary | ICD-10-CM | POA: Diagnosis not present

## 2013-06-30 DIAGNOSIS — E7800 Pure hypercholesterolemia, unspecified: Secondary | ICD-10-CM | POA: Diagnosis not present

## 2013-06-30 NOTE — Telephone Encounter (Signed)
So I do not do the opthamology exam he has to see an eye doctor but we can do the foot exam, just get his shoes off at visit please

## 2013-06-30 NOTE — Telephone Encounter (Signed)
Patient is due for an a1c per meaningful use.  He is coming in on 07-03-2013.  Please ask about foot exam and opthalmology exam at that time.

## 2013-07-01 NOTE — Telephone Encounter (Signed)
Left detailed message re: below instruction and to call if any questions.

## 2013-07-03 ENCOUNTER — Encounter: Payer: Self-pay | Admitting: Family Medicine

## 2013-07-03 ENCOUNTER — Ambulatory Visit (INDEPENDENT_AMBULATORY_CARE_PROVIDER_SITE_OTHER): Payer: Medicare Other | Admitting: Family Medicine

## 2013-07-03 VITALS — BP 108/80 | HR 70 | Temp 98.4°F | Ht 72.0 in | Wt 266.1 lb

## 2013-07-03 DIAGNOSIS — H6120 Impacted cerumen, unspecified ear: Secondary | ICD-10-CM

## 2013-07-03 DIAGNOSIS — G4730 Sleep apnea, unspecified: Secondary | ICD-10-CM

## 2013-07-03 DIAGNOSIS — H6123 Impacted cerumen, bilateral: Secondary | ICD-10-CM

## 2013-07-03 DIAGNOSIS — G4733 Obstructive sleep apnea (adult) (pediatric): Secondary | ICD-10-CM

## 2013-07-03 DIAGNOSIS — R030 Elevated blood-pressure reading, without diagnosis of hypertension: Secondary | ICD-10-CM | POA: Diagnosis not present

## 2013-07-03 DIAGNOSIS — E119 Type 2 diabetes mellitus without complications: Secondary | ICD-10-CM | POA: Diagnosis not present

## 2013-07-03 DIAGNOSIS — IMO0001 Reserved for inherently not codable concepts without codable children: Secondary | ICD-10-CM

## 2013-07-03 DIAGNOSIS — E785 Hyperlipidemia, unspecified: Secondary | ICD-10-CM

## 2013-07-03 MED ORDER — LISINOPRIL 2.5 MG PO TABS: 2.5000 mg | ORAL_TABLET | Freq: Every day | ORAL | Status: DC

## 2013-07-03 MED ORDER — NEOMYCIN-POLYMYXIN-HC 3.5-10000-1 OT SOLN: 3.0000 [drp] | Freq: Every day | OTIC | Status: DC

## 2013-07-03 NOTE — Patient Instructions (Signed)

## 2013-07-03 NOTE — Progress Notes (Signed)
Patient ID: Joseph [name], male   DOB: 1975/10/19, 37 y.o.   MRN: [id number] BASHIR MARCHETTI [id number] 1976-03-06 07/03/2013      Progress Note-Follow Up  Subjective  Chief Complaint  Chief Complaint  Patient presents with  . Follow-up    6 month    HPI  Patient is a 37 year old Caucasian male who is accompanied by a worker from his group home. She's doing well. Any recent illness. He has been eating better since his last visit and exercising more. He denies any recent illness. No fevers, chills, chest pain, palpitations, shortness of breath, GI or GU complaints at this time.  Past Medical History  Diagnosis Date  . Anemia     post operative  . Hyperlipidemia   . Schizophrenia   . Anxiety   . Obesity   . Cellulitis     bilateral thighs  . Lupus anticoagulant positive 08/15/2011  . Antiphospholipid syndrome 08/15/2011  . Compartment syndrome   . Elevated BP 10/20/2012    Past Surgical History  Procedure Laterality Date  . I&d extremity      left leg  . Tonsillectomy    . Wisdom tooth extraction    . Leg surgery      Family History  Problem Relation Age of Onset  . GER disease Father   . Diverticulosis Father   . Colon cancer Neg Hx     History   Social History  . Marital Status: Single    Spouse Name: N/A    Number of Children: 0  . Years of Education: N/A   Occupational History  .  [employer]  .      [employer]   Social History Main Topics  . Smoking status: Former Smoker    Types: Cigarettes    Start date: 07/15/2012  . Smokeless tobacco: Former Neurosurgeon    Types: Chew     Comment: smoked for a couple of months  . 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 Drug Use: No  . Sexual Activity: Not Currently   Other Topics Concern  . Not on file   Social History Narrative  . No narrative on file    Current Outpatient Prescriptions on File Prior to Visit  Medication Sig Dispense Refill   . clonazePAM (KLONOPIN) 1 MG tablet Take 1 mg by mouth daily as needed. For anxiety      . divalproex (DEPAKOTE) 500 MG EC tablet Take 1,000 mg by mouth at bedtime.       Marland Kitchen FLUoxetine (PROZAC) 20 MG tablet Take 10 mg at noon, and 20 mg at bedtime.      Marland Kitchen ibuprofen (ADVIL,MOTRIN) 200 MG tablet Take 1 tablet (200 mg total) by mouth every 6 (six) hours as needed.  60 tablet  5  . Multiple Vitamin (MULTIVITAMIN) capsule Take 1 capsule by mouth daily.  30 capsule  11  . OLANZapine (ZYPREXA) 20 MG tablet Take 40 mg by mouth at bedtime.      Marland Kitchen RA ASPIRIN ADULT LOW STRENGTH 81 MG EC tablet take 1 tablet by mouth once daily  30 tablet  11  . RA COL-RITE 100 MG capsule take 1 capsule by mouth twice a day  60 capsule  11  . trolamine salicylate (ASPERCREME) 10 % cream Apply topically every 2 (two) hours as needed. pain  141.7 g  5  No current facility-administered medications on file prior to visit.    Allergies  Allergen Reactions  . Haldol [Haloperidol Decanoate]     Painful   . Statins     ? Related to compartment syndrome    Review of Systems  Review of Systems  Constitutional: Negative for fever and malaise/fatigue.  HENT: Negative for congestion.   Eyes: Negative for discharge.  Respiratory: Negative for shortness of breath.   Cardiovascular: Negative for chest pain, palpitations and leg swelling.  Gastrointestinal: Negative for nausea, abdominal pain and diarrhea.  Genitourinary: Negative for dysuria.  Musculoskeletal: Negative for falls.  Skin: Negative for rash.  Neurological: Negative for loss of consciousness and headaches.  Endo/Heme/Allergies: Negative for polydipsia.  Psychiatric/Behavioral: Negative for depression and suicidal ideas. The patient is not nervous/anxious and does not have insomnia.     Objective  BP 108/80  Pulse 70  Temp(Src) 98.4 F (36.9 C) (Oral)  Ht 6' (1.829 m)  Wt 266 lb 1.9 oz (120.711 kg)  BMI 36.08 kg/m2  SpO2 94%  Physical  Exam  Physical Exam  Constitutional: He is oriented to person, place, and time and well-developed, well-nourished, and in no distress. No distress.  HENT:  Head: Normocephalic and atraumatic.  Eyes: Conjunctivae are normal.  Neck: Neck supple. No thyromegaly present.  Cardiovascular: Normal rate, regular rhythm and normal heart sounds.   No murmur heard. Pulmonary/Chest: Effort normal and breath sounds normal. No respiratory distress.  Abdominal: He exhibits no distension and no mass. There is no tenderness.  Musculoskeletal: He exhibits no edema.  Neurological: He is alert and oriented to person, place, and time.  Skin: Skin is warm.  Psychiatric: Memory, affect and judgment normal.    Lab Results  Component Value Date   TSH 1.216 11/04/2011   Lab Results  Component Value Date   WBC 6.3 12/31/2012   HGB 14.8 12/31/2012   HCT 44.1 12/31/2012   MCV 91.9 12/31/2012   PLT 278 12/31/2012   Lab Results  Component Value Date   CREATININE 0.90 12/31/2012   BUN 11 12/31/2012   NA 138 12/31/2012   K 4.6 12/31/2012   CL 103 12/31/2012   CO2 30 12/31/2012   Lab Results  Component Value Date   ALT 34 12/31/2012   AST 20 12/31/2012   ALKPHOS 59 12/31/2012   BILITOT 0.5 12/31/2012   Lab Results  Component Value Date   CHOL 170 12/31/2012   Lab Results  Component Value Date   HDL 45 12/31/2012   Lab Results  Component Value Date   LDLCALC 100* 12/31/2012   Lab Results  Component Value Date   TRIG 125 12/31/2012   Lab Results  Component Value Date   CHOLHDL 3.8 12/31/2012     Assessment & Plan  Elevated BP Well controlled, no changes.   Other and unspecified hyperlipidemia Doing well with dietary changes, and exercise  OSA (obstructive sleep apnea) Using CPAP machine  Diabetes mellitus Patient reports recent good control of sugars, eating better and exercising

## 2013-07-04 LAB — MICROALBUMIN / CREATININE URINE RATIO
Creatinine, Urine: 65.5 mg/dL
Microalb Creat Ratio: 7.6 mg/g (ref 0.0–30.0)
Microalb, Ur: 0.5 mg/dL (ref 0.00–1.89)

## 2013-07-06 NOTE — Assessment & Plan Note (Signed)
Doing well with dietary changes, and exercise

## 2013-07-06 NOTE — Assessment & Plan Note (Signed)
Patient reports recent good control of sugars, eating better and exercising

## 2013-07-06 NOTE — Assessment & Plan Note (Signed)
Well controlled, no changes 

## 2013-07-06 NOTE — Assessment & Plan Note (Signed)
Using CPAP machine

## 2013-07-11 ENCOUNTER — Encounter: Payer: Self-pay | Admitting: Family Medicine

## 2013-07-11 ENCOUNTER — Ambulatory Visit (INDEPENDENT_AMBULATORY_CARE_PROVIDER_SITE_OTHER): Payer: Medicare Other | Admitting: Family Medicine

## 2013-07-11 VITALS — BP 112/70 | HR 78 | Temp 98.2°F | Wt 263.8 lb

## 2013-07-11 DIAGNOSIS — H6120 Impacted cerumen, unspecified ear: Secondary | ICD-10-CM

## 2013-07-11 DIAGNOSIS — G4733 Obstructive sleep apnea (adult) (pediatric): Secondary | ICD-10-CM | POA: Diagnosis not present

## 2013-07-11 DIAGNOSIS — F200 Paranoid schizophrenia: Secondary | ICD-10-CM | POA: Diagnosis not present

## 2013-07-11 DIAGNOSIS — H6123 Impacted cerumen, bilateral: Secondary | ICD-10-CM

## 2013-07-11 NOTE — Patient Instructions (Signed)
Cerumen Impaction A cerumen impaction is when the wax in your ear forms a plug. This plug usually causes reduced hearing. Sometimes it also causes an earache or dizziness. Removing a cerumen impaction can be difficult and painful. The wax sticks to the ear canal. The canal is sensitive and bleeds easily. If you try to remove a heavy wax buildup with a cotton tipped swab, you may push it in further. Irrigation with water, suction, and small ear curettes may be used to clear out the wax. If the impaction is fixed to the skin in the ear canal, ear drops may be needed for a few days to loosen the wax. People who build up a lot of wax frequently can use ear wax removal products available in your local drugstore. SEEK MEDICAL CARE IF:  You develop an earache, increased hearing loss, or marked dizziness. Document Released: 10/19/2004 Document Revised: 12/04/2011 Document Reviewed: 12/09/2009 [employer] Patient Information 2014 [employer]  

## 2013-07-13 ENCOUNTER — Encounter: Payer: Self-pay | Admitting: Family Medicine

## 2013-07-13 DIAGNOSIS — H6120 Impacted cerumen, unspecified ear: Secondary | ICD-10-CM | POA: Insufficient documentation

## 2013-07-13 HISTORY — DX: Impacted cerumen, unspecified ear: H61.20

## 2013-07-13 NOTE — Assessment & Plan Note (Signed)
Tolerated flushing well. Endorses he hears better after flushing, some mild erythema in canals c/w mild Otitis externa, Cortisporin HC 3 drops bid for next 5 days then as needed.

## 2013-07-13 NOTE — Progress Notes (Signed)
Patient ID: Joseph [name], male   DOB: [birth year], 37 y.o.   MRN: [id number] Joseph [name] [id number] 03/21/1976 07/13/2013      Progress Note-Follow Up  Subjective  Chief Complaint  Chief Complaint  Patient presents with  . Cerumen Impaction    HPI  Patient is a 37 year old Caucasian male in today from his group home for disimpaction of bilateral ear canals. He continues to complain of some popping in his ears and decreased hearing. No pain. No congestion or headaches. No fevers. Otherwise he generally feels well although he is complaining of a scratchy irritated throat most notably in the mornings. His aide worker confirms he snores significantly and thinks it relates to discuss throughout the day he is better. No cough or chest pain. No reflux, palpitations, shortness of breath  Past Medical History  Diagnosis Date  . Anemia     post operative  . Hyperlipidemia   . Schizophrenia   . Anxiety   . Obesity   . Cellulitis     bilateral thighs  . Lupus anticoagulant positive 08/15/2011  . Antiphospholipid syndrome 08/15/2011  . Compartment syndrome   . Elevated BP 10/20/2012  . Cerumen impaction 07/13/2013    Past Surgical History  Procedure Laterality Date  . I&d extremity      left leg  . Tonsillectomy    . Wisdom tooth extraction    . Leg surgery      Family History  Problem Relation Age of Onset  . GER disease Father   . Diverticulosis Father   . Colon cancer Neg Hx     History   Social History  . Marital Status: Single    Spouse Name: N/A    Number of Children: 0  . Years of Education: N/A   Occupational History  .  [employer]  .      [employer]   Social History Main Topics  . Smoking status: Former Smoker    Types: Cigarettes    Start date: 07/15/2012  . Smokeless tobacco: Former Neurosurgeon    Types: Chew     Comment: smoked for a couple of months  . 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 Drug Use: No  . Sexual Activity: Not Currently   Other Topics Concern  . Not on file   Social History Narrative  . No narrative on file    Current Outpatient Prescriptions on File Prior to Visit  Medication Sig Dispense Refill  . clonazePAM (KLONOPIN) 1 MG tablet Take 1 mg by mouth daily as needed. For anxiety      . divalproex (DEPAKOTE) 500 MG EC tablet Take 1,000 mg by mouth at bedtime.       Marland Kitchen FLUoxetine (PROZAC) 20 MG tablet Take 10 mg at noon, and 20 mg at bedtime.      Marland Kitchen ibuprofen (ADVIL,MOTRIN) 200 MG tablet Take 1 tablet (200 mg total) by mouth every 6 (six) hours as needed.  60 tablet  5  . lisinopril (ZESTRIL) 2.5 MG tablet Take 1 tablet (2.5 mg total) by mouth daily.  30 tablet  3  . metFORMIN (GLUCOPHAGE-XR) 500 MG 24 hr tablet       . Multiple Vitamin (MULTIVITAMIN) capsule Take 1 capsule by mouth daily.  30 capsule  11  . neomycin-polymyxin-hydrocortisone (CORTISPORIN) otic solution Place 3 drops into both ears at bedtime. X  10 days  10 mL  0  . OLANZapine (ZYPREXA) 20 MG tablet Take 40 mg by mouth at bedtime.      Marland Kitchen RA ASPIRIN ADULT LOW STRENGTH 81 MG EC tablet take 1 tablet by mouth once daily  30 tablet  11  . RA COL-RITE 100 MG capsule take 1 capsule by mouth twice a day  60 capsule  11  . trolamine salicylate (ASPERCREME) 10 % cream Apply topically every 2 (two) hours as needed. pain  141.7 g  5   No current facility-administered medications on file prior to visit.    Allergies  Allergen Reactions  . Haldol [Haloperidol Decanoate]     Painful   . Statins     ? Related to compartment syndrome    Review of Systems  Review of Systems  Constitutional: Negative for fever and malaise/fatigue.  HENT: Negative for congestion.   Eyes: Negative for discharge.  Respiratory: Negative for shortness of breath.   Cardiovascular: Negative for chest pain, palpitations and leg swelling.  Gastrointestinal: Negative for nausea, abdominal pain and diarrhea.   Genitourinary: Negative for dysuria.  Musculoskeletal: Negative for falls.  Skin: Negative for rash.  Neurological: Negative for loss of consciousness and headaches.  Endo/Heme/Allergies: Negative for polydipsia.  Psychiatric/Behavioral: Negative for depression and suicidal ideas. The patient is not nervous/anxious and does not have insomnia.     Objective  BP 112/70  Pulse 78  Temp(Src) 98.2 F (36.8 C) (Oral)  Wt 263 lb 12 oz (119.636 kg)  BMI 35.76 kg/m2  SpO2 98%  Physical Exam  Physical Exam  Constitutional: He is oriented to person, place, and time and well-developed, well-nourished, and in no distress. No distress.  HENT:  Head: Normocephalic and atraumatic.  Eyes: Conjunctivae are normal.  Cerumen in canals b/l once removed external canal erythematous and scaly.   Neck: Neck supple. No thyromegaly present.  Cardiovascular: Normal rate, regular rhythm and normal heart sounds.   No murmur heard. Pulmonary/Chest: Effort normal and breath sounds normal. No respiratory distress.  Abdominal: He exhibits no distension and no mass. There is no tenderness.  Musculoskeletal: He exhibits no edema.  Neurological: He is alert and oriented to person, place, and time.  Skin: Skin is warm.  Psychiatric: Memory, affect and judgment normal.    Lab Results  Component Value Date   TSH 1.216 11/04/2011   Lab Results  Component Value Date   WBC 6.3 12/31/2012   HGB 14.8 12/31/2012   HCT 44.1 12/31/2012   MCV 91.9 12/31/2012   PLT 278 12/31/2012   Lab Results  Component Value Date   CREATININE 0.90 12/31/2012   BUN 11 12/31/2012   NA 138 12/31/2012   K 4.6 12/31/2012   CL 103 12/31/2012   CO2 30 12/31/2012   Lab Results  Component Value Date   ALT 34 12/31/2012   AST 20 12/31/2012   ALKPHOS 59 12/31/2012   BILITOT 0.5 12/31/2012   Lab Results  Component Value Date   CHOL 170 12/31/2012   Lab Results  Component Value Date   HDL 45 12/31/2012   Lab Results  Component Value Date   LDLCALC 100*  12/31/2012   Lab Results  Component Value Date   TRIG 125 12/31/2012   Lab Results  Component Value Date   CHOLHDL 3.8 12/31/2012     Assessment & Plan  OSA (obstructive sleep apnea) Is c/o dry, irritated throat most notably in am likely related to mouth breathing qhs, the aide  from his group home acknowledges significant snoring with patient, they are scheduled with pulmonology to evaluate his sleep apnea.   Cerumen impaction Tolerated flushing well. Endorses he hears better after flushing, some mild erythema in canals c/w mild Otitis externa, Cortisporin HC 3 drops bid for next 5 days then as needed.

## 2013-07-13 NOTE — Assessment & Plan Note (Signed)
Is c/o dry, irritated throat most notably in am likely related to mouth breathing qhs, the aide from his group home acknowledges significant snoring with patient, they are scheduled with pulmonology to evaluate his sleep apnea.

## 2013-07-25 DIAGNOSIS — F200 Paranoid schizophrenia: Secondary | ICD-10-CM | POA: Diagnosis not present

## 2013-08-02 IMAGING — CT CT TIBIA FIBULA *R* W/ CM
2 of 6 series · 10 of 20 positions shown, 12 images · IV contrast (100ml omni 300)
Comparison: CT of the left lower extremity 03/15/2011

CLINICAL DATA: Acute pain right lower leg.  History of compartment
syndrome on the left.  Edema.

CT RIGHT CALF WITH CONTRAST
TECHNIQUE: Multidetector CT imaging of the right calf was
performed according to the standard protocol following intravenous
contrast administration. Multiplanar CT image reconstructions were
also generated.
Contrast: 100mL OMNIPAQUE IOHEXOL 300 MG/ML IV SOLN

[Series 2: lower ext · axial · 0.50mm/px · z∈[-448,-138]mm · 5 of 186 slices shown, 7 images (1 of 2)]
[im 31/186  soft-tissue]
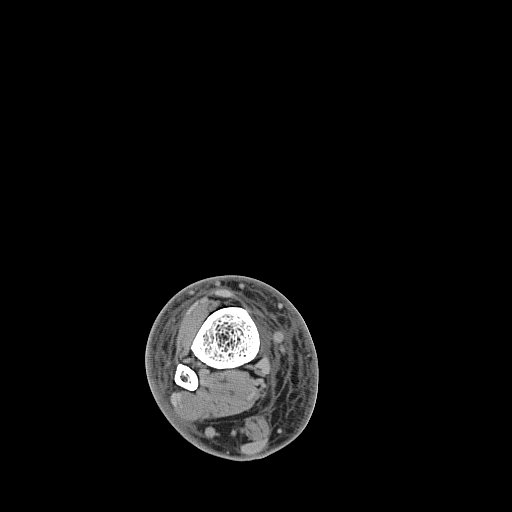
[im 31/186  bone]
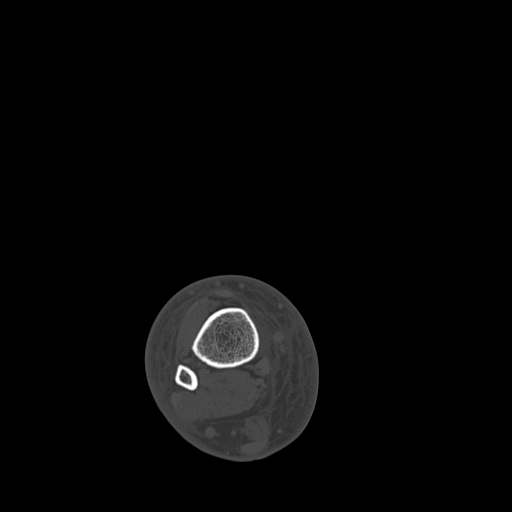
[im 62/186  bone]
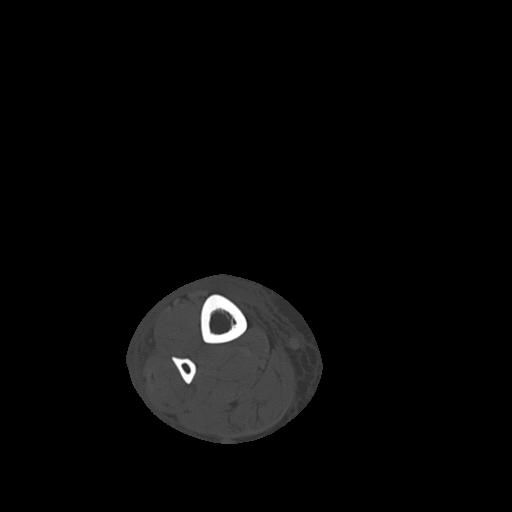
[im 93/186  bone]
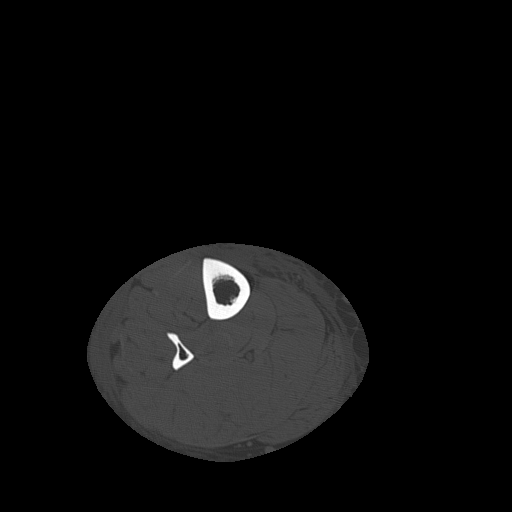
[im 124/186  bone]
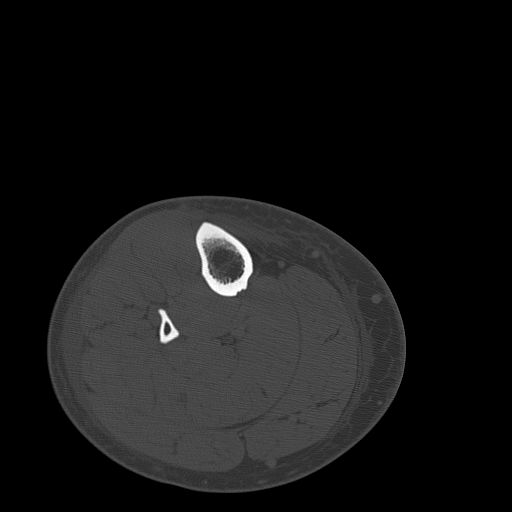
[im 155/186  soft-tissue]
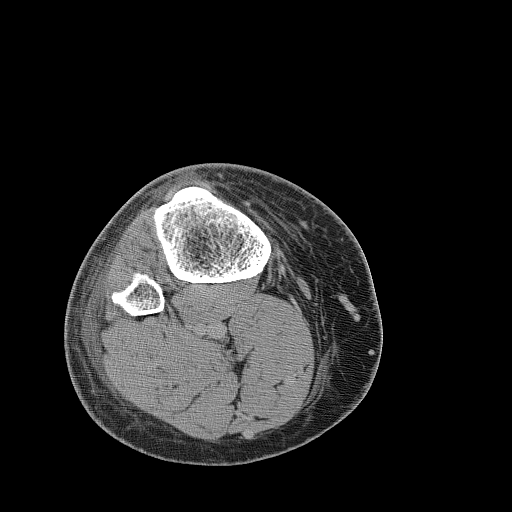
[im 155/186  bone]
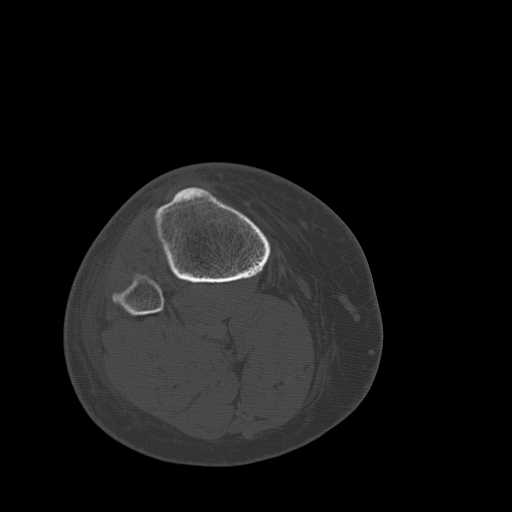

[Series 102: lower ext · axial · 0.50mm/px · z∈[-448,-138]mm · 5 of 186 slices shown (2 of 2)]
[im 31/186  bone]
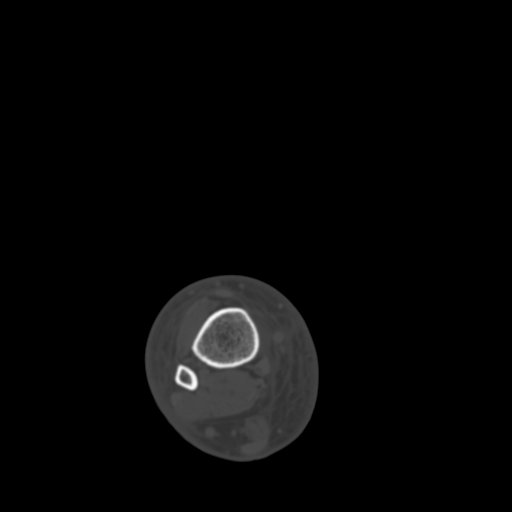
[im 62/186  bone]
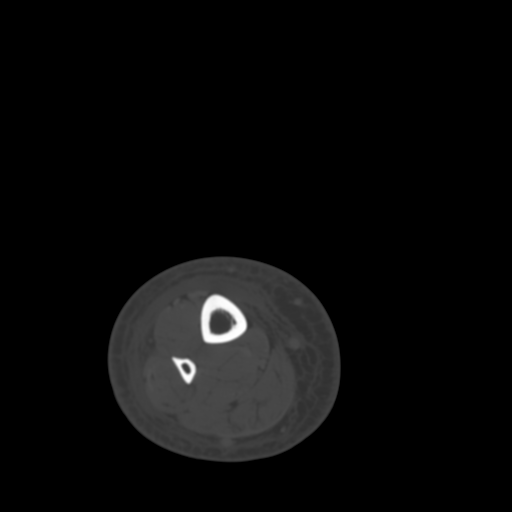
[im 93/186  bone]
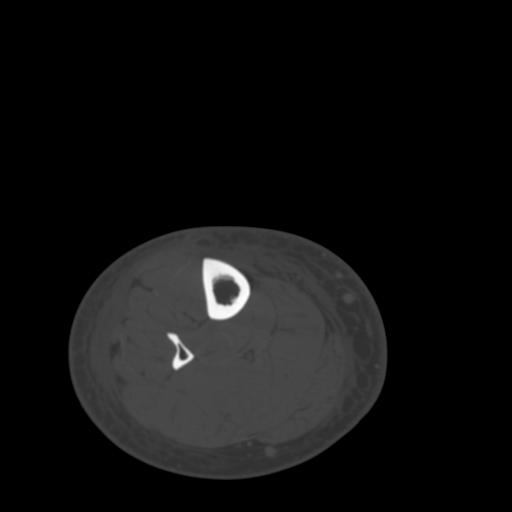
[im 124/186  bone]
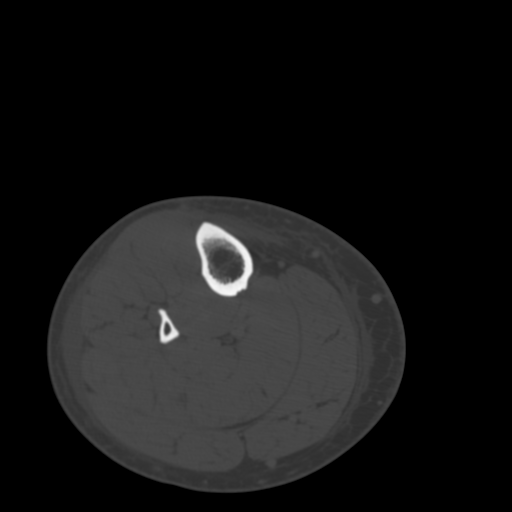
[im 155/186  bone]
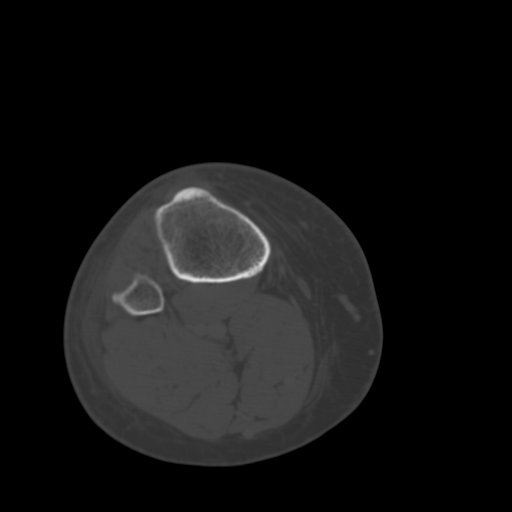

[10 of 20 positions shown; findings below may reference images not displayed]

FINDINGS: There is subcutaneous edema/stranding compatible with
cellulitis.  As seen on the prior left lower extremity CT, there is
enlargement/edema involving the tibialis anterior muscle, not as
pronounced as was seen on the opposite side previously.  The
adjacent muscles not definitively involved.  Possible involvement
of the tibialis posterior muscle.  No fluid collection to suggest
abscess.  No acute bony abnormality.
IMPRESSION: Diffuse subcutaneous edema/stranding compatible with cellulitis.

Edema/enlargement of the tibialis anterior muscle and possibly
tibialis posterior muscle compatible with myositis.  Cannot exclude
compartment syndrome.

## 2013-08-05 ENCOUNTER — Institutional Professional Consult (permissible substitution): Payer: Medicare Other | Admitting: Pulmonary Disease

## 2013-08-08 DIAGNOSIS — F200 Paranoid schizophrenia: Secondary | ICD-10-CM | POA: Diagnosis not present

## 2013-08-12 ENCOUNTER — Ambulatory Visit (INDEPENDENT_AMBULATORY_CARE_PROVIDER_SITE_OTHER): Payer: Medicare Other | Admitting: Pulmonary Disease

## 2013-08-12 ENCOUNTER — Encounter: Payer: Self-pay | Admitting: Pulmonary Disease

## 2013-08-12 VITALS — BP 100/78 | HR 84 | Temp 98.2°F | Ht 74.0 in | Wt 260.0 lb

## 2013-08-12 DIAGNOSIS — G4733 Obstructive sleep apnea (adult) (pediatric): Secondary | ICD-10-CM

## 2013-08-12 NOTE — Progress Notes (Signed)
Subjective:    Patient ID: Joseph [name], male    DOB: [birth year], 37 y.o.   MRN: [id number]  HPI 37 year old man with schizophrenia presents for evaluation of obstructive sleep apnea. He lives in a group home-[facility]. Epworth sleepiness score is 8/24. He reports daily naps for about one hour which refreshing. Report sleepiness while watching TV, as a passenger in a car and lying down to rest in the afternoon. Bedtime is around 9 PM, sleep latency about 15 minutes, he sleeps on his side with 2 pillows, he is one to 2 nocturnal awakenings for bathroom visits and is out of bed at 7 AM feeling tired. He is gained 20 pounds over the last 5 years. He drinks 2 cups of coffee in 4 cups of tea daily.s quit smoking. There is no history suggestive of cataplexy, sleep paralysis or parasomnias  Diagnostic polysomnogram will in December 2007 when he weighed 240 pounds showed predominant hypopneas with AHI of 10 events per hour with mild oxygen desaturation to lowest of 87%. This was corrected by CPAP of 6 cm with a full face mask. However he stopped using his machine and misplaced his machine between moving from the group home back to his house.   Past Medical History  Diagnosis Date  . Anemia     post operative  . Hyperlipidemia   . Schizophrenia   . Anxiety   . Obesity   . Cellulitis     bilateral thighs  . Lupus anticoagulant positive 08/15/2011  . Antiphospholipid syndrome 08/15/2011  . Compartment syndrome   . Elevated BP 10/20/2012  . Cerumen impaction 07/13/2013    Past Surgical History  Procedure Laterality Date  . I&d extremity      left leg  . Tonsillectomy    . Wisdom tooth extraction    . Leg surgery    . Eye surgery      b/l laser eye sx 2004    Allergies  Allergen Reactions  . Haldol [Haloperidol Decanoate]     Painful   . Statins     ? Related to compartment syndrome    History   Social History  . Marital Status: Single    Spouse Name: N/A     Number of Children: 0  . Years of Education: N/A   Occupational History  .  [employer]  .      [employer]   Social History Main Topics  . Smoking status: Former Smoker -- 1.00 packs/day for 1 years    Types: Cigarettes    Start date: 07/15/2012  . Smokeless tobacco: Former Neurosurgeon    Types: Chew  . 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 Drug Use: No  . Sexual Activity: Not Currently   Other Topics Concern  . Not on file   Social History Narrative  . No narrative on file    Family History  Problem Relation Age of Onset  . GER disease Father   . Diverticulosis Father   . Colon cancer Neg Hx      Review of Systems  Constitutional: Negative for fever and unexpected weight change.  HENT: Positive for sore throat. Negative for congestion, dental problem, ear pain, nosebleeds, postnasal drip, rhinorrhea, sinus pressure, sneezing and trouble swallowing.   Eyes: Negative for redness and itching.  Respiratory: Negative for cough, chest tightness, shortness of breath and wheezing.  Cardiovascular: Negative for palpitations and leg swelling.  Gastrointestinal: Negative for nausea and vomiting.  Genitourinary: Negative for dysuria.  Musculoskeletal: Negative for joint swelling.  Skin: Negative for rash.  Neurological: Negative for headaches.  Hematological: Does not bruise/bleed easily.  Psychiatric/Behavioral: Negative for dysphoric mood. The patient is nervous/anxious.        Objective:   Physical Exam  Gen. Pleasant, obese, in no distress, normal affect ENT - no lesions, no post nasal drip, class 2-3 airway Neck: No JVD, no thyromegaly, no carotid bruits Lungs: no use of accessory muscles, no dullness to percussion, decreased without rales or rhonchi  Cardiovascular: Rhythm regular, heart sounds  normal, no murmurs or gallops, no peripheral edema Abdomen: soft and non-tender, no hepatosplenomegaly, BS  normal. Musculoskeletal: No deformities, no cyanosis or clubbing Neuro:  alert, non focal, no tremors       Assessment & Plan:

## 2013-08-12 NOTE — Patient Instructions (Signed)
Sleep study will be scheduled We will get you new CPAP machine after

## 2013-08-12 NOTE — Assessment & Plan Note (Signed)
Sleep study will be scheduled We will get you new CPAP machine after  Given excessive daytime somnolence, narrow pharyngeal exam, witnessed apneas & loud snoring, obstructive sleep apnea is very likely & an overnight polysomnogram will be scheduled as a split study. The pathophysiology of obstructive sleep apnea , it's cardiovascular consequences & modes of treatment including CPAP were discused with the patient in detail & they evidenced understanding.

## 2013-08-29 DIAGNOSIS — F200 Paranoid schizophrenia: Secondary | ICD-10-CM | POA: Diagnosis not present

## 2013-09-09 ENCOUNTER — Ambulatory Visit (HOSPITAL_BASED_OUTPATIENT_CLINIC_OR_DEPARTMENT_OTHER): Payer: Medicare Other | Attending: Pulmonary Disease | Admitting: Radiology

## 2013-09-09 VITALS — Ht 72.0 in | Wt 254.0 lb

## 2013-09-09 DIAGNOSIS — G4733 Obstructive sleep apnea (adult) (pediatric): Secondary | ICD-10-CM | POA: Diagnosis not present

## 2013-09-09 DIAGNOSIS — F209 Schizophrenia, unspecified: Secondary | ICD-10-CM | POA: Diagnosis not present

## 2013-09-12 DIAGNOSIS — F200 Paranoid schizophrenia: Secondary | ICD-10-CM | POA: Diagnosis not present

## 2013-09-16 ENCOUNTER — Telehealth: Payer: Self-pay | Admitting: Pulmonary Disease

## 2013-09-16 DIAGNOSIS — G4733 Obstructive sleep apnea (adult) (pediatric): Secondary | ICD-10-CM

## 2013-09-16 DIAGNOSIS — G4710 Hypersomnia, unspecified: Secondary | ICD-10-CM

## 2013-09-16 DIAGNOSIS — G4730 Sleep apnea, unspecified: Secondary | ICD-10-CM

## 2013-09-16 NOTE — Sleep Study (Signed)
[facility]  NAME: Joseph [name]  DATE OF BIRTH: Mar 22, 1976  MEDICAL RECORD NUMBER [id number]  LOCATION: [facility]  PHYSICIAN: ALVA,RAKESH V.  DATE OF STUDY:09/16/2013   SLEEP STUDY TYPE: Split Polysomnogram               REFERRING PHYSICIAN: Oretha Milch, MD  INDICATION FOR STUDY: 37 year old man with schizophrenia who lives in a group home-[facility]. Diagnostic polysomnogram in December 2007 when he weighed 240 pounds showed predominant hypopneas with AHI of 10 events per hour with mild oxygen desaturation to lowest of 87%. This was corrected by CPAP of 6 cm with a full face mask  At the time of this study ,they weighed 254 pounds with a height of  6 ft 0 inches and the BMI of 34, neck size of 17 inches. Epworth sleepiness score was 3   This intervention polysomnogram was performed with a sleep technologist in attendance. EEG, EOG,EMG and respiratory parameters recorded. Sleep stages, arousals, limb movements and respiratory data was scored according to criteria laid out by the American Academy of sleep medicine.  SLEEP ARCHITECTURE: Lights out was at 21-33 PM and lights on was at 4-55 AM. CPAP was initiated at 00-17am.During the baseline portion ,total sleep time was 125 minutes with sleep latency of 39 minutes with latency to REM sleep of 0 minutes and wake after sleep onset of 0 minutes. Sleep efficiency was poor at 76 with frequent long periods of awakening. Sleep stages as a percentage of total sleep time was N1 -4%,N2- 95% and REM sleep 0% ( 0 minutes) . During titration REM sleep accounted for 31 minutes and supine sleep was noted .The longest period of REM sleep was around 2-30 AM.   AROUSAL DATA : At baseline ,there were 11 arousals with an arousal index of 5 events per hour. Of these 3 were spontaneous, and 8 were associated with respiratory events and 0 were associated periodic limb movements. During titration, the arousal  index was 5  events per hour  RESPIRATORY DATA: During the baseline portion,there were 1 obstructive apneas, 1 central apneas, 0 mixed apneas and 128 hypopneas with apnea -hypopnea index of 62 events per hour.  There was no relation to sleep stage or body position. Due to this degree of respiratory disturbance CPAP was initiated at 5 cm and titrated to find a level of 12 cm due to persistent events during supine sleep. At the level of 12 cm for 115 minutes of sleep, 1obs, 5 central apneas and 0 hypopneas were noted. Titration was  optimal.  MOVEMENT/PARASOMNIA: There were 0 PLMS with a PLM index of 0 events per hour. The PLM arousal index was 0 events per hour. PLMS seemed to improve during titration.  OXYGEN DATA: The lowest desaturation was 76% during non-REM sleep and the desaturation index was 65 per hour. This was corrected by titration and the saturations stayed below 88% for 20 minutes during titration.  CARDIAC DATA: The low heart rate was 47 beats per minute. The high heart rate recorded was an artifact. No arrhythmias were noted   IMPRESSION :  1. Severe obstructive sleep apnea with hypopneas causing sleep fragmentation and mild oxygen desaturation. 2. This was corrected by CPAP of 12 centimeters with medium full face mask. Titration was optimal 3. No evidence of cardiac arrhythmias or behavioral disturbance during sleep. Periodic limb movements were not noted.  RECOMMENDATION:    1. The treatment options for this  degree of sleep disordered breathing includes weight loss and CPAP therapy. 2. CPAP can be initiated at 12 centimeters with a medium full face mask and compliance monitored at this level. 3. Patient should be cautioned against driving when sleepy.They should be asked to avoid medications with sedative side effects  ALVA,RAKESH V. Diplomate, Biomedical engineer of Sleep Medicine  ELECTRONICALLY SIGNED ON:  08/26/2013, 1:24 PM [facility] PH: [phone]  [phone]   FX: [phone] ACCREDITED BY THE AMERICAN ACADEMY OF SLEEP MEDICINE

## 2013-09-16 NOTE — Telephone Encounter (Signed)
Order sent to DME for CPAP 12cm, med FF mask

## 2013-09-16 NOTE — Telephone Encounter (Signed)
I spoke with patient about results and he verbalized understanding and had no questions 

## 2013-10-01 DIAGNOSIS — F200 Paranoid schizophrenia: Secondary | ICD-10-CM | POA: Diagnosis not present

## 2013-10-17 DIAGNOSIS — F200 Paranoid schizophrenia: Secondary | ICD-10-CM | POA: Diagnosis not present

## 2013-10-30 ENCOUNTER — Telehealth: Payer: Self-pay

## 2013-10-30 DIAGNOSIS — R52 Pain, unspecified: Secondary | ICD-10-CM

## 2013-10-30 MED ORDER — TROLAMINE SALICYLATE 10 % EX CREA: TOPICAL_CREAM | CUTANEOUS | Status: DC | PRN

## 2013-10-30 NOTE — Telephone Encounter (Signed)
[facility] called stating that the RX for Aspercreme has expired and they would like a new RX faxed to [facility] sent

## 2013-10-31 DIAGNOSIS — F200 Paranoid schizophrenia: Secondary | ICD-10-CM | POA: Diagnosis not present

## 2013-11-14 DIAGNOSIS — F200 Paranoid schizophrenia: Secondary | ICD-10-CM | POA: Diagnosis not present

## 2013-11-25 ENCOUNTER — Ambulatory Visit (INDEPENDENT_AMBULATORY_CARE_PROVIDER_SITE_OTHER): Payer: Medicare Other | Admitting: Pulmonary Disease

## 2013-11-25 ENCOUNTER — Encounter: Payer: Self-pay | Admitting: Pulmonary Disease

## 2013-11-25 VITALS — BP 124/76 | HR 76 | Temp 97.8°F | Ht 73.0 in | Wt 274.4 lb

## 2013-11-25 DIAGNOSIS — G4733 Obstructive sleep apnea (adult) (pediatric): Secondary | ICD-10-CM

## 2013-11-25 NOTE — Patient Instructions (Signed)
Your CPAP is set at 12 cm Change filters every6 months SUpplies at least once/year Usage of at least 6h/ night is expected

## 2013-11-25 NOTE — Assessment & Plan Note (Signed)
Your CPAP is set at 12 cm Change filters every6 months SUpplies at least once/year Usage of at least 6h/ night is expected  Weight loss encouraged, compliance with goal of at least 4-6 hrs every night is the expectation. Advised against medications with sedative side effects Cautioned against driving when sleepy - understanding that sleepiness will vary on a day to day basis

## 2013-11-25 NOTE — Progress Notes (Signed)
   Subjective:    Patient ID: Joseph [name], male    DOB: 07-31-1976, 38 y.o.   MRN: [id number]  HPI  38 year old man with schizophrenia who lives in a group home-[facility].  Diagnostic polysomnogram will in December 2007 when he weighed 240 pounds showed predominant hypopneas with AHI of 10 events per hour with mild oxygen desaturation to lowest of 87%. This was corrected by CPAP of 6 cm with a full face mask.  However he stopped using his machine and misplaced his machine between moving from the group home back to his house.   11/25/2013  Chief Complaint  Patient presents with  . Follow-up    OSA. Wears CPAP from 9p-7a every night. Pt states pressure setting feel fine. Overall doing well.    PSG 08/2013 showed AHI 62/h, predom hypopneas corrected by 12 cm  Order sent to DME for CPAP 12cm, med FF mask Download 11/2013 - good compliance, > 6h, only occ missed day, leak ++, AHI 1.5/h Mask ok , pressure ok, leak does not bother him Feels better rested   Review of Systems neg for any significant sore throat, dysphagia, itching, sneezing, nasal congestion or excess/ purulent secretions, fever, chills, sweats, unintended wt loss, pleuritic or exertional cp, hempoptysis, orthopnea pnd or change in chronic leg swelling. Also denies presyncope, palpitations, heartburn, abdominal pain, nausea, vomiting, diarrhea or change in bowel or urinary habits, dysuria,hematuria, rash, arthralgias, visual complaints, headache, numbness weakness or ataxia.     Objective:   Physical Exam  Gen. Pleasant, well-nourished, in no distress ENT - no lesions, no post nasal drip Neck: No JVD, no thyromegaly, no carotid bruits Lungs: no use of accessory muscles, no dullness to percussion, clear without rales or rhonchi  Cardiovascular: Rhythm regular, heart sounds  normal, no murmurs or gallops, no peripheral edema Musculoskeletal: No deformities, no cyanosis or clubbing        Assessment & Plan:

## 2013-12-02 ENCOUNTER — Telehealth: Payer: Self-pay | Admitting: Family Medicine

## 2013-12-02 DIAGNOSIS — R52 Pain, unspecified: Secondary | ICD-10-CM

## 2013-12-02 MED ORDER — TROLAMINE SALICYLATE 10 % EX CREA: TOPICAL_CREAM | CUTANEOUS | Status: DC | PRN

## 2013-12-02 NOTE — Telephone Encounter (Signed)
Arbie Cookey called in for patient stating that he needs a refill of Aspercreme sent to [facility], Phone# [phone]

## 2013-12-12 DIAGNOSIS — F200 Paranoid schizophrenia: Secondary | ICD-10-CM | POA: Diagnosis not present

## 2013-12-22 IMAGING — CR DG CHEST 2V
2 series · 2 of 2 positions shown · non-contrast
Comparison: 11/03/2011

CLINICAL DATA: Follow-up left lower lobe pneumonia.

CHEST - 2 VIEW

[w chest pa]
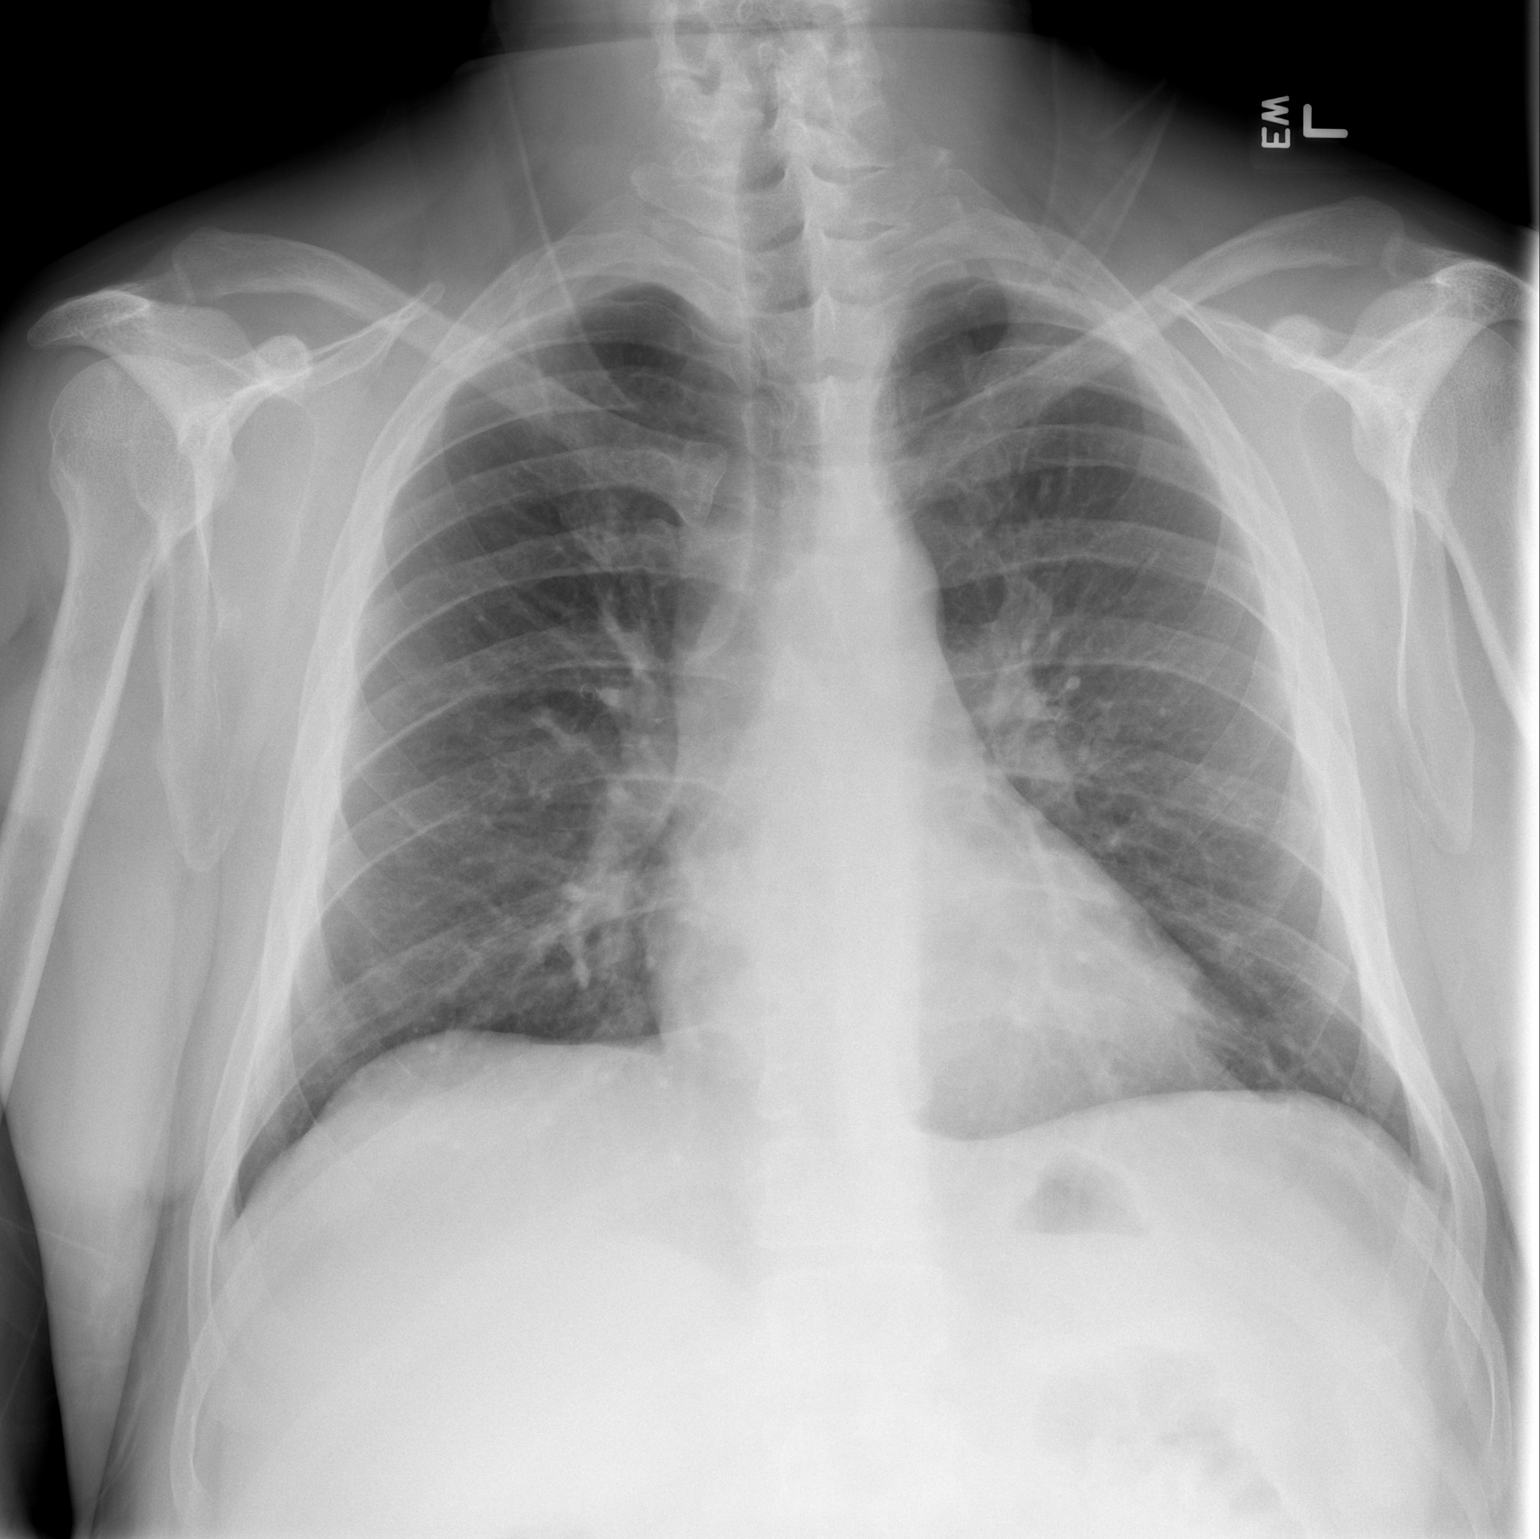

[w chest lat]
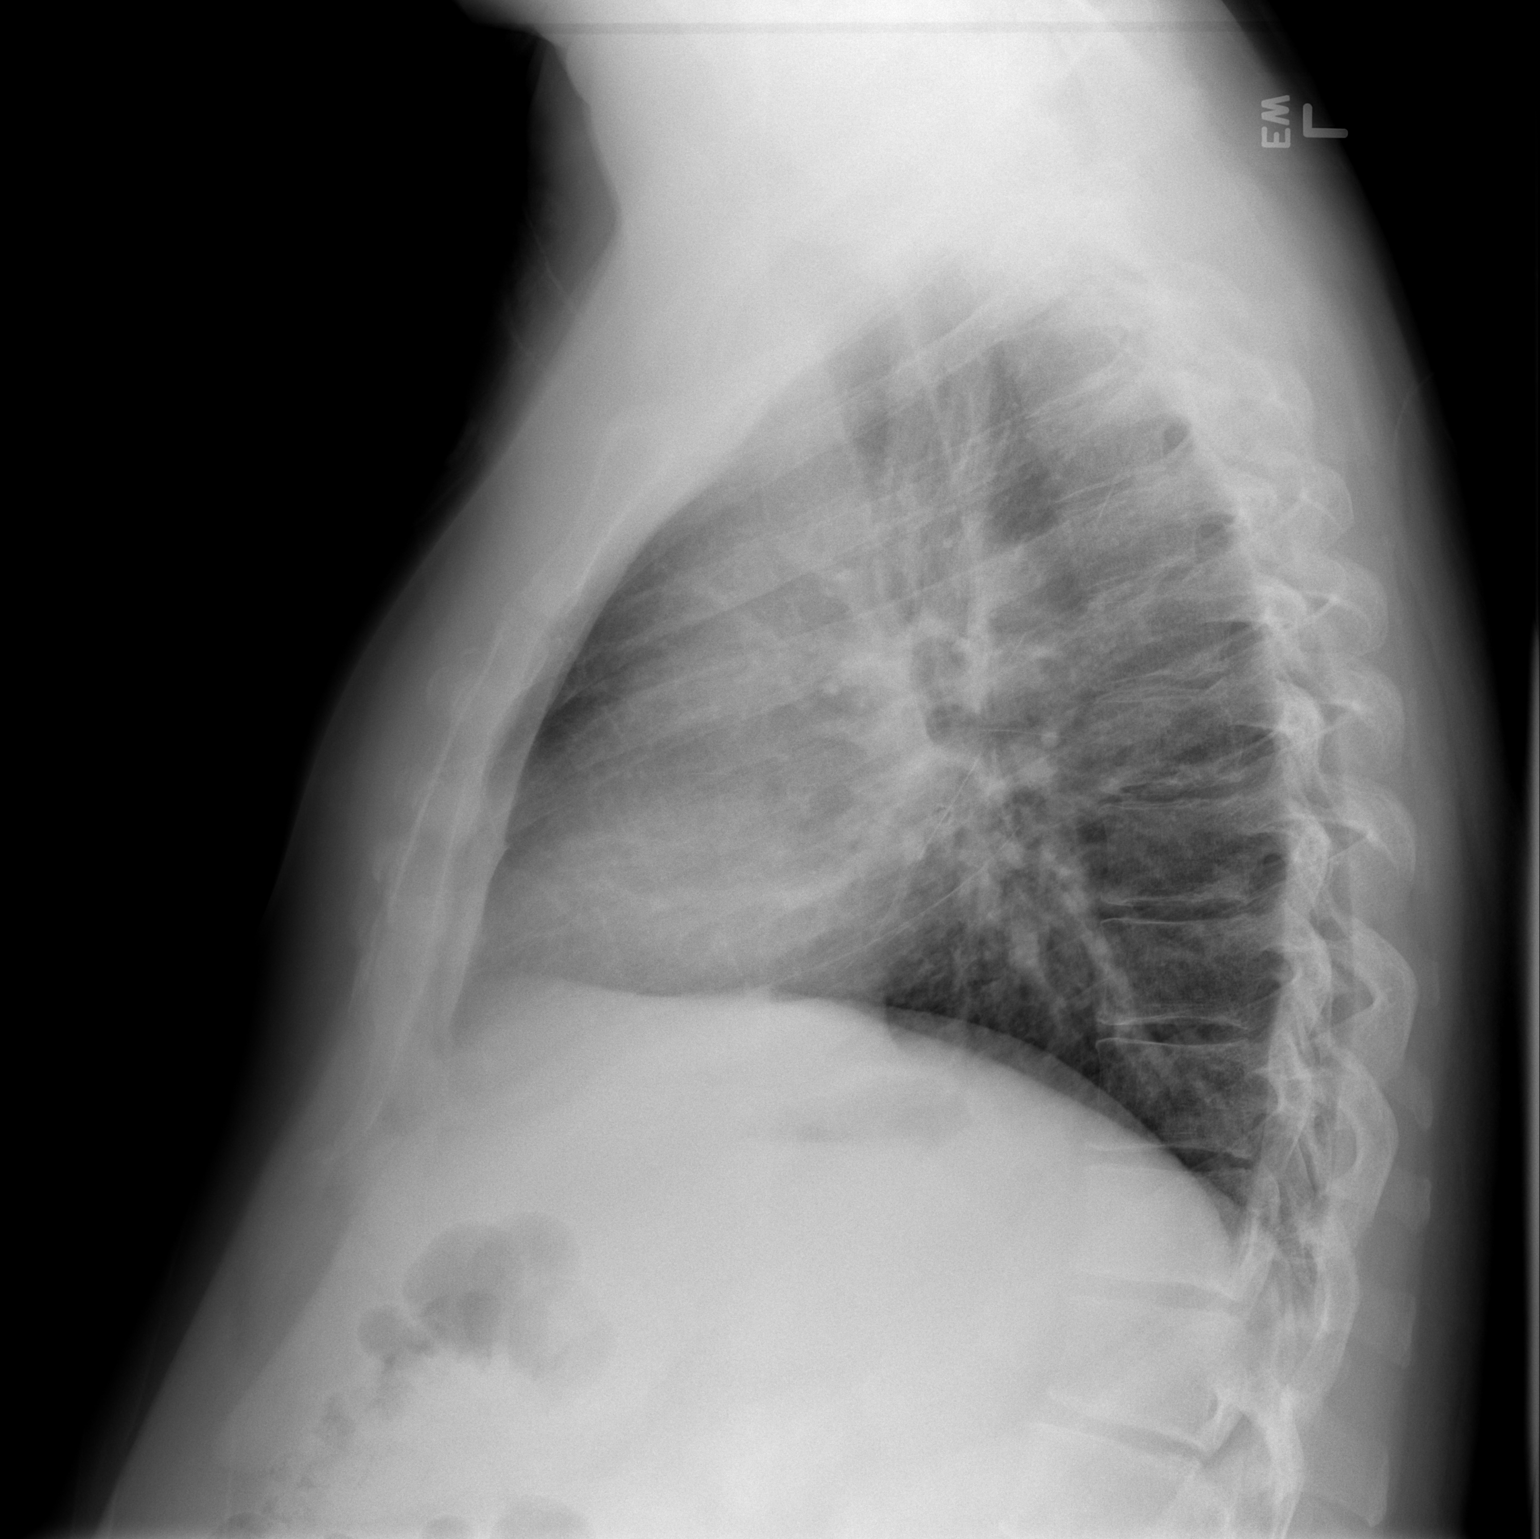

[2 of 2 positions shown; findings below may reference images not displayed]

FINDINGS: Lungs are clear.  No residual airspace opacities.  No
effusions.  Heart is normal size.  No acute bony abnormality.
IMPRESSION: Interval resolution of airspace opacities.  No acute or active
disease.

## 2013-12-29 DIAGNOSIS — E119 Type 2 diabetes mellitus without complications: Secondary | ICD-10-CM | POA: Diagnosis not present

## 2013-12-29 DIAGNOSIS — E7800 Pure hypercholesterolemia, unspecified: Secondary | ICD-10-CM | POA: Diagnosis not present

## 2014-01-01 DIAGNOSIS — E119 Type 2 diabetes mellitus without complications: Secondary | ICD-10-CM | POA: Diagnosis not present

## 2014-01-01 DIAGNOSIS — E7800 Pure hypercholesterolemia, unspecified: Secondary | ICD-10-CM | POA: Diagnosis not present

## 2014-01-06 DIAGNOSIS — F200 Paranoid schizophrenia: Secondary | ICD-10-CM | POA: Diagnosis not present

## 2014-01-09 DIAGNOSIS — F200 Paranoid schizophrenia: Secondary | ICD-10-CM | POA: Diagnosis not present

## 2014-01-15 ENCOUNTER — Telehealth: Payer: Self-pay | Admitting: Family Medicine

## 2014-01-15 NOTE — Telephone Encounter (Signed)
Needs physical, no appts available till Sept. [facility] needs it by May for him to live there. Please advise.

## 2014-01-15 NOTE — Telephone Encounter (Signed)
Can you find a spot to see him, he is easy. I will come in early the day you suggest in next month

## 2014-01-20 NOTE — Telephone Encounter (Signed)
Can you see if he can come in at 11:45 on Thursday 01-22-14 please and let us know

## 2014-01-22 NOTE — Telephone Encounter (Signed)
Spoke to [place] at [facility] and she states that she cannot bring patient in this afternoon or at 7:15 am.

## 2014-01-22 NOTE — Telephone Encounter (Signed)
Already scheduled on 02-03-14 at 7:45 am

## 2014-02-02 ENCOUNTER — Other Ambulatory Visit: Payer: Self-pay | Admitting: Family Medicine

## 2014-02-03 ENCOUNTER — Ambulatory Visit (INDEPENDENT_AMBULATORY_CARE_PROVIDER_SITE_OTHER): Payer: Medicare Other | Admitting: Family Medicine

## 2014-02-03 ENCOUNTER — Ambulatory Visit (HOSPITAL_BASED_OUTPATIENT_CLINIC_OR_DEPARTMENT_OTHER)
Admission: RE | Admit: 2014-02-03 | Discharge: 2014-02-03 | Disposition: A | Discharge status: Home / Self Care | Payer: Medicare Other | Source: Ambulatory Visit | Attending: Family Medicine | Admitting: Family Medicine

## 2014-02-03 ENCOUNTER — Encounter: Payer: Self-pay | Admitting: Family Medicine

## 2014-02-03 ENCOUNTER — Other Ambulatory Visit: Payer: Self-pay | Admitting: Family Medicine

## 2014-02-03 VITALS — BP 112/68 | HR 66 | Temp 97.4°F | Ht 72.0 in | Wt 264.0 lb

## 2014-02-03 DIAGNOSIS — Z0000 Encounter for general adult medical examination without abnormal findings: Secondary | ICD-10-CM

## 2014-02-03 DIAGNOSIS — E119 Type 2 diabetes mellitus without complications: Secondary | ICD-10-CM | POA: Diagnosis not present

## 2014-02-03 DIAGNOSIS — Z720 Tobacco use: Secondary | ICD-10-CM

## 2014-02-03 DIAGNOSIS — R109 Unspecified abdominal pain: Secondary | ICD-10-CM | POA: Diagnosis not present

## 2014-02-03 DIAGNOSIS — R7611 Nonspecific reaction to tuberculin skin test without active tuberculosis: Secondary | ICD-10-CM | POA: Insufficient documentation

## 2014-02-03 DIAGNOSIS — R52 Pain, unspecified: Secondary | ICD-10-CM

## 2014-02-03 DIAGNOSIS — F209 Schizophrenia, unspecified: Secondary | ICD-10-CM | POA: Diagnosis not present

## 2014-02-03 DIAGNOSIS — R030 Elevated blood-pressure reading, without diagnosis of hypertension: Secondary | ICD-10-CM

## 2014-02-03 DIAGNOSIS — K1370 Unspecified lesions of oral mucosa: Secondary | ICD-10-CM | POA: Diagnosis not present

## 2014-02-03 DIAGNOSIS — IMO0001 Reserved for inherently not codable concepts without codable children: Secondary | ICD-10-CM

## 2014-02-03 DIAGNOSIS — F259 Schizoaffective disorder, unspecified: Secondary | ICD-10-CM

## 2014-02-03 DIAGNOSIS — F17200 Nicotine dependence, unspecified, uncomplicated: Secondary | ICD-10-CM

## 2014-02-03 LAB — URINALYSIS
Bilirubin Urine: NEGATIVE
Glucose, UA: NEGATIVE mg/dL
Hgb urine dipstick: NEGATIVE
Ketones, ur: NEGATIVE mg/dL
Leukocytes, UA: NEGATIVE
Nitrite: NEGATIVE
Protein, ur: NEGATIVE mg/dL
Specific Gravity, Urine: 1.016 (ref 1.005–1.030)
Urobilinogen, UA: 0.2 mg/dL (ref 0.0–1.0)
pH: 7 (ref 5.0–8.0)

## 2014-02-03 LAB — CBC
HCT: 41 % (ref 39.0–52.0)
Hemoglobin: 14.1 g/dL (ref 13.0–17.0)
MCH: 31.4 pg (ref 26.0–34.0)
MCHC: 34.4 g/dL (ref 30.0–36.0)
MCV: 91.3 fL (ref 78.0–100.0)
Platelets: 229 10*3/uL (ref 150–400)
RBC: 4.49 MIL/uL (ref 4.22–5.81)
RDW: 13 % (ref 11.5–15.5)
WBC: 6.3 10*3/uL (ref 4.0–10.5)

## 2014-02-03 LAB — TSH: TSH: 2.37 u[IU]/mL (ref 0.350–4.500)

## 2014-02-03 MED ORDER — LISINOPRIL 2.5 MG PO TABS: 2.5000 mg | ORAL_TABLET | Freq: Every day | ORAL | Status: DC

## 2014-02-03 MED ORDER — TROLAMINE SALICYLATE 10 % EX CREA: TOPICAL_CREAM | CUTANEOUS | Status: DC | PRN

## 2014-02-03 MED ORDER — RA ONE DAILY MAXIMUM PO TABS: 1.0000 | ORAL_TABLET | Freq: Every day | ORAL | Status: DC

## 2014-02-03 MED ORDER — IBUPROFEN 200 MG PO TABS: 200.0000 mg | ORAL_TABLET | Freq: Four times a day (QID) | ORAL | Status: DC | PRN

## 2014-02-03 MED ORDER — DOCUSATE SODIUM 100 MG PO CAPS: 100.0000 mg | ORAL_CAPSULE | Freq: Two times a day (BID) | ORAL | Status: DC

## 2014-02-03 MED ORDER — ASPIRIN 81 MG PO TBEC: 81.0000 mg | DELAYED_RELEASE_TABLET | Freq: Every day | ORAL | Status: DC

## 2014-02-03 NOTE — Progress Notes (Signed)
Pre visit review using our clinic review tool, if applicable. No additional management support is needed unless otherwise documented below in the visit note. 

## 2014-02-03 NOTE — Patient Instructions (Signed)

## 2014-02-05 DIAGNOSIS — E119 Type 2 diabetes mellitus without complications: Secondary | ICD-10-CM | POA: Diagnosis not present

## 2014-02-06 DIAGNOSIS — F200 Paranoid schizophrenia: Secondary | ICD-10-CM | POA: Diagnosis not present

## 2014-02-08 DIAGNOSIS — K1370 Unspecified lesions of oral mucosa: Secondary | ICD-10-CM | POA: Insufficient documentation

## 2014-02-08 DIAGNOSIS — R7611 Nonspecific reaction to tuberculin skin test without active tuberculosis: Secondary | ICD-10-CM | POA: Insufficient documentation

## 2014-02-08 NOTE — Assessment & Plan Note (Signed)
Well controlled, no changes. Encouraged heart healthy diet such as the DASH diet and exercise as tolerated.  

## 2014-02-08 NOTE — Progress Notes (Signed)
Patient ID: Joseph [name], male   DOB: [birth year], 38 y.o.   MRN: [id number] TOBI LEINWEBER [id number] 03/18/1976 02/08/2014      Progress Note-Follow Up  Subjective  Chief Complaint  Chief Complaint  Patient presents with  . Annual Exam    physical    HPI  Patient is a 38 year old male in today for routine medical care. In today with the aid from his group home for an annual exam. Any paperwork filled out for discharge. He has no significant acute concerns. He has been feeling well. They do acknowledge some intermittent sore throats but nothing significant has been having some popping in drainage from his left ear occasionally but none today. No fevers or chills. Denies CP/palp/SOB/HA/congestion/fevers/GI or GU c/o. Taking meds as prescribed   Past Medical History  Diagnosis Date  . Anemia     post operative  . Hyperlipidemia   . Schizophrenia   . Anxiety   . Obesity   . Cellulitis     bilateral thighs  . Lupus anticoagulant positive 08/15/2011  . Antiphospholipid syndrome 08/15/2011  . Compartment syndrome   . Elevated BP 10/20/2012  . Cerumen impaction 07/13/2013    Past Surgical History  Procedure Laterality Date  . I&d extremity      left leg  . Tonsillectomy    . Wisdom tooth extraction    . Leg surgery    . Eye surgery      b/l laser eye sx 2004    Family History  Problem Relation Age of Onset  . GER disease Father   . Diverticulosis Father   . Colon cancer Neg Hx     History   Social History  . Marital Status: Single    Spouse Name: N/A    Number of Children: 0  . Years of Education: N/A   Occupational History  .  [employer]  .      [employer]   Social History Main Topics  . Smoking status: Former Smoker -- 1.00 packs/day for 1 years    Types: Cigarettes    Start date: 07/15/2012  . Smokeless tobacco: Former Systems developer    Types: Chew  . 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 Drug  Use: No  . Sexual Activity: Not Currently   Other Topics Concern  . Not on file   Social History Narrative  . No narrative on file    Current Outpatient Prescriptions on File Prior to Visit  Medication Sig Dispense Refill  . clonazePAM (KLONOPIN) 1 MG tablet Take 1 mg by mouth daily. For anxiety      . divalproex (DEPAKOTE) 500 MG EC tablet Take 1,000 mg by mouth 3 (three) times daily. 1 in a.m., 2 at bedtime      . FLUoxetine (PROZAC) 20 MG tablet Take 10 mg at noon, and 20 mg at bedtime.      Marland Kitchen OLANZapine (ZYPREXA) 20 MG tablet Take 40 mg by mouth at bedtime.      Marland Kitchen RA COL-RITE 100 MG capsule take 1 capsule by mouth twice a day  60 capsule  11   No current facility-administered medications on file prior to visit.    Allergies  Allergen Reactions  . Haldol [Haloperidol Decanoate]     Painful   . Statins     ? Related to compartment syndrome  Review of Systems  Review of Systems  Constitutional: Negative for fever, chills and malaise/fatigue.  HENT: Positive for sore throat. Negative for congestion, hearing loss and nosebleeds.   Eyes: Negative for discharge.  Respiratory: Negative for cough, sputum production, shortness of breath and wheezing.   Cardiovascular: Negative for chest pain, palpitations and leg swelling.  Gastrointestinal: Negative for heartburn, nausea, vomiting, abdominal pain, diarrhea, constipation and blood in stool.  Genitourinary: Negative for dysuria, urgency, frequency and hematuria.  Musculoskeletal: Negative for back pain, falls and myalgias.  Skin: Negative for rash.  Neurological: Negative for dizziness, tremors, sensory change, focal weakness, loss of consciousness, weakness and headaches.  Endo/Heme/Allergies: Negative for polydipsia. Does not bruise/bleed easily.  Psychiatric/Behavioral: Negative for depression and suicidal ideas. The patient is not nervous/anxious and does not have insomnia.     Objective  BP 112/68  Pulse 66   Temp(Src) 97.4 F (36.3 C) (Oral)  Ht 6' (1.829 m)  Wt 264 lb (119.75 kg)  BMI 35.80 kg/m2  SpO2 96%  Physical Exam  Physical Exam  Constitutional: He is oriented to person, place, and time and well-developed, well-nourished, and in no distress. No distress.  HENT:  Head: Normocephalic and atraumatic.  White patch on uvula, no surrounding erythema or swelling  Eyes: Conjunctivae are normal.  Neck: Neck supple. No thyromegaly present.  Cardiovascular: Normal rate, regular rhythm and normal heart sounds.   No murmur heard. Pulmonary/Chest: Effort normal and breath sounds normal. No respiratory distress.  Abdominal: He exhibits no distension and no mass. There is no tenderness.  Musculoskeletal: He exhibits no edema.  Neurological: He is alert and oriented to person, place, and time.  Skin: Skin is warm.  Psychiatric: Memory, affect and judgment normal.    Lab Results  Component Value Date   TSH 2.370 02/03/2014   Lab Results  Component Value Date   WBC 6.3 02/03/2014   HGB 14.1 02/03/2014   HCT 41.0 02/03/2014   MCV 91.3 02/03/2014   PLT 229 02/03/2014   Lab Results  Component Value Date   CREATININE 0.90 12/31/2012   BUN 11 12/31/2012   NA 138 12/31/2012   K 4.6 12/31/2012   CL 103 12/31/2012   CO2 30 12/31/2012   Lab Results  Component Value Date   ALT 34 12/31/2012   AST 20 12/31/2012   ALKPHOS 59 12/31/2012   BILITOT 0.5 12/31/2012   Lab Results  Component Value Date   CHOL 170 12/31/2012   Lab Results  Component Value Date   HDL 45 12/31/2012   Lab Results  Component Value Date   LDLCALC 100* 12/31/2012   Lab Results  Component Value Date   TRIG 125 12/31/2012   Lab Results  Component Value Date   CHOLHDL 3.8 12/31/2012     Assessment & Plan  Tobacco use Quit October 2014  Schizoaffective disorder Doing well in group home on current meds. Is following with psychiatry  Oral lesion On uvula, white patch and recurrent sore throat. Will refer to Oral maxillofacial  surgery  Diabetes mellitus hgba1c acceptable, minimize simple carbs. Increase exercise as tolerated. Continue current meds  Elevated BP Well controlled, no changes. Encouraged heart healthy diet such as the DASH diet and exercise as tolerated.   PPD positive CXR negative

## 2014-02-08 NOTE — Assessment & Plan Note (Signed)
hgba1c acceptable, minimize simple carbs. Increase exercise as tolerated. Continue current meds 

## 2014-02-08 NOTE — Assessment & Plan Note (Signed)
CXR negative

## 2014-02-08 NOTE — Assessment & Plan Note (Signed)
Quit October 2014

## 2014-02-08 NOTE — Assessment & Plan Note (Signed)
Doing well in group home on current meds. Is following with psychiatry

## 2014-02-08 NOTE — Assessment & Plan Note (Signed)
On uvula, white patch and recurrent sore throat. Will refer to Oral maxillofacial surgery

## 2014-02-16 ENCOUNTER — Other Ambulatory Visit: Payer: Self-pay | Admitting: Family Medicine

## 2014-02-17 NOTE — Telephone Encounter (Signed)
Refills were printed on 02/03/14 for ASA and lisinopril. Re-sent refills electronically.

## 2014-03-02 DIAGNOSIS — D3701 Neoplasm of uncertain behavior of lip: Secondary | ICD-10-CM | POA: Diagnosis not present

## 2014-03-02 DIAGNOSIS — D3709 Neoplasm of uncertain behavior of other specified sites of the oral cavity: Secondary | ICD-10-CM | POA: Diagnosis not present

## 2014-03-06 DIAGNOSIS — F200 Paranoid schizophrenia: Secondary | ICD-10-CM | POA: Diagnosis not present

## 2014-04-03 DIAGNOSIS — F200 Paranoid schizophrenia: Secondary | ICD-10-CM | POA: Diagnosis not present

## 2014-05-01 DIAGNOSIS — F200 Paranoid schizophrenia: Secondary | ICD-10-CM | POA: Diagnosis not present

## 2014-05-28 DIAGNOSIS — F200 Paranoid schizophrenia: Secondary | ICD-10-CM | POA: Diagnosis not present

## 2014-06-09 ENCOUNTER — Encounter: Payer: Self-pay | Admitting: Pulmonary Disease

## 2014-06-09 ENCOUNTER — Ambulatory Visit (INDEPENDENT_AMBULATORY_CARE_PROVIDER_SITE_OTHER): Payer: Medicare Other | Admitting: Pulmonary Disease

## 2014-06-09 ENCOUNTER — Encounter (INDEPENDENT_AMBULATORY_CARE_PROVIDER_SITE_OTHER): Payer: Self-pay

## 2014-06-09 VITALS — BP 120/78 | HR 71 | Temp 98.6°F | Ht 73.0 in | Wt 280.0 lb

## 2014-06-09 DIAGNOSIS — Z23 Encounter for immunization: Secondary | ICD-10-CM | POA: Diagnosis not present

## 2014-06-09 DIAGNOSIS — G4730 Sleep apnea, unspecified: Secondary | ICD-10-CM

## 2014-06-09 NOTE — Assessment & Plan Note (Signed)
CPAP supplies will be renewed for a year Change filters once / 3 months Change mask if lining worn out -every 3-6 months Flu shot today  Weight loss encouraged, compliance with goal of at least 4-6 hrs every night is the expectation. Advised against medications with sedative side effects Cautioned against driving when sleepy - understanding that sleepiness will vary on a day to day basis

## 2014-06-09 NOTE — Patient Instructions (Signed)
CPAP supplies will be renewed for a year Change filters once / 3 months Change mask if lining worn out -every 3-6 months Flu shot today

## 2014-06-09 NOTE — Progress Notes (Signed)
   Subjective:    Patient ID: Joseph [name], male    DOB: [birth year], 38 y.o.   MRN: [id number]  HPI  38 year old man with schizophrenia who lives in a group home-[facility].  PSG 08/2006 - 240 pounds- AHI of 10 events per hour with mild oxygen desaturation to lowest of 87%. This was corrected by CPAP of 6 cm with a full face mask.  However he stopped using his machine and misplaced his machine between moving from the group home back to his house.  PSG 08/2013 showed AHI 62/h, predom hypopneas corrected by 12 cm  Set up with CPAP 12cm, med FF mask  Download 11/2013 - good compliance, > 6h, only occ missed day, leak ++, AHI 1.5/h   06/09/2014  Chief Complaint  Patient presents with  . Sleep Apnea    Pt is wearing cpap every night for about 9 hours a night. Pt denies any issues with mask fit or pressure at this time.    Accompanide by home manager Mask ok , pressure ok, leak does not bother him  Feels better rested No snoring   Review of Systems neg for any significant sore throat, dysphagia, itching, sneezing, nasal congestion or excess/ purulent secretions, fever, chills, sweats, unintended wt loss, pleuritic or exertional cp, hempoptysis, orthopnea pnd or change in chronic leg swelling. Also denies presyncope, palpitations, heartburn, abdominal pain, nausea, vomiting, diarrhea or change in bowel or urinary habits, dysuria,hematuria, rash, arthralgias, visual complaints, headache, numbness weakness or ataxia.     Objective:   Physical Exam  pleasant HEENT - no thrush, no post nasal drip no lymphadenopathy CVS- s1s2 nml, no murmur, no JVD RS- clear, no crackles or rhonchi Abd- soft, non-tender, no organomegaly CNS- non focal Ext - no clubbing, cyanosis or edema       Assessment & Plan:

## 2014-07-02 DIAGNOSIS — E780 Pure hypercholesterolemia: Secondary | ICD-10-CM | POA: Diagnosis not present

## 2014-07-02 DIAGNOSIS — E119 Type 2 diabetes mellitus without complications: Secondary | ICD-10-CM | POA: Diagnosis not present

## 2014-07-03 DIAGNOSIS — F200 Paranoid schizophrenia: Secondary | ICD-10-CM | POA: Diagnosis not present

## 2014-07-07 DIAGNOSIS — E780 Pure hypercholesterolemia: Secondary | ICD-10-CM | POA: Diagnosis not present

## 2014-07-07 DIAGNOSIS — E119 Type 2 diabetes mellitus without complications: Secondary | ICD-10-CM | POA: Diagnosis not present

## 2014-07-14 DIAGNOSIS — F200 Paranoid schizophrenia: Secondary | ICD-10-CM | POA: Diagnosis not present

## 2014-07-31 DIAGNOSIS — F200 Paranoid schizophrenia: Secondary | ICD-10-CM | POA: Diagnosis not present

## 2014-08-28 DIAGNOSIS — F200 Paranoid schizophrenia: Secondary | ICD-10-CM | POA: Diagnosis not present

## 2014-09-12 ENCOUNTER — Other Ambulatory Visit: Payer: Self-pay | Admitting: Nurse Practitioner

## 2014-09-29 DIAGNOSIS — F200 Paranoid schizophrenia: Secondary | ICD-10-CM | POA: Diagnosis not present

## 2014-10-12 ENCOUNTER — Other Ambulatory Visit: Payer: Self-pay | Admitting: Family Medicine

## 2014-10-14 NOTE — Telephone Encounter (Signed)
Patient requesting refill.  Last refill was 02/03/14- got 60 and 11 (prescribed q6h PRN).  Patient has CPE scheduled for 02/08/15, but last labs were 02/03/14.  Please advise.  eal

## 2014-10-26 DIAGNOSIS — F200 Paranoid schizophrenia: Secondary | ICD-10-CM | POA: Diagnosis not present

## 2014-11-27 DIAGNOSIS — F200 Paranoid schizophrenia: Secondary | ICD-10-CM | POA: Diagnosis not present

## 2014-12-21 DIAGNOSIS — F200 Paranoid schizophrenia: Secondary | ICD-10-CM | POA: Diagnosis not present

## 2015-01-05 DIAGNOSIS — E119 Type 2 diabetes mellitus without complications: Secondary | ICD-10-CM | POA: Diagnosis not present

## 2015-01-05 DIAGNOSIS — E780 Pure hypercholesterolemia: Secondary | ICD-10-CM | POA: Diagnosis not present

## 2015-01-11 DIAGNOSIS — E780 Pure hypercholesterolemia: Secondary | ICD-10-CM | POA: Diagnosis not present

## 2015-01-11 DIAGNOSIS — E119 Type 2 diabetes mellitus without complications: Secondary | ICD-10-CM | POA: Diagnosis not present

## 2015-01-12 DIAGNOSIS — E780 Pure hypercholesterolemia: Secondary | ICD-10-CM | POA: Diagnosis not present

## 2015-01-12 DIAGNOSIS — E119 Type 2 diabetes mellitus without complications: Secondary | ICD-10-CM | POA: Diagnosis not present

## 2015-01-18 DIAGNOSIS — F200 Paranoid schizophrenia: Secondary | ICD-10-CM | POA: Diagnosis not present

## 2015-01-20 DIAGNOSIS — F200 Paranoid schizophrenia: Secondary | ICD-10-CM | POA: Diagnosis not present

## 2015-01-28 ENCOUNTER — Encounter: Payer: Self-pay | Admitting: Family Medicine

## 2015-02-08 ENCOUNTER — Other Ambulatory Visit: Payer: Self-pay | Admitting: Family Medicine

## 2015-02-08 ENCOUNTER — Telehealth: Payer: Self-pay | Admitting: Family Medicine

## 2015-02-08 ENCOUNTER — Encounter: Payer: Medicare Other | Admitting: Family Medicine

## 2015-02-08 DIAGNOSIS — E119 Type 2 diabetes mellitus without complications: Secondary | ICD-10-CM | POA: Diagnosis not present

## 2015-02-08 NOTE — Telephone Encounter (Signed)
Pre Visit letter sent  °

## 2015-02-11 DIAGNOSIS — E780 Pure hypercholesterolemia: Secondary | ICD-10-CM | POA: Diagnosis not present

## 2015-02-11 DIAGNOSIS — E119 Type 2 diabetes mellitus without complications: Secondary | ICD-10-CM | POA: Diagnosis not present

## 2015-02-22 ENCOUNTER — Other Ambulatory Visit: Payer: Self-pay | Admitting: Family Medicine

## 2015-02-26 ENCOUNTER — Telehealth: Payer: Self-pay | Admitting: *Deleted

## 2015-02-26 NOTE — Telephone Encounter (Signed)
Unable to reach patient at time of Pre-Visit Call.  The individual who answered phone stated that he was unavailable but knew he had an appt at 345 Monday

## 2015-03-01 ENCOUNTER — Other Ambulatory Visit: Payer: Self-pay | Admitting: Family Medicine

## 2015-03-01 ENCOUNTER — Encounter: Payer: Self-pay | Admitting: Family Medicine

## 2015-03-01 ENCOUNTER — Ambulatory Visit (HOSPITAL_BASED_OUTPATIENT_CLINIC_OR_DEPARTMENT_OTHER)
Admission: RE | Admit: 2015-03-01 | Discharge: 2015-03-01 | Disposition: A | Discharge status: Home / Self Care | Payer: Medicare Other | Source: Ambulatory Visit | Attending: Family Medicine | Admitting: Family Medicine

## 2015-03-01 ENCOUNTER — Ambulatory Visit (INDEPENDENT_AMBULATORY_CARE_PROVIDER_SITE_OTHER): Payer: Medicare Other | Admitting: Family Medicine

## 2015-03-01 VITALS — BP 110/82 | HR 66 | Temp 98.7°F | Ht 74.0 in | Wt 266.1 lb

## 2015-03-01 DIAGNOSIS — R7611 Nonspecific reaction to tuberculin skin test without active tuberculosis: Secondary | ICD-10-CM

## 2015-03-01 DIAGNOSIS — L659 Nonscarring hair loss, unspecified: Secondary | ICD-10-CM

## 2015-03-01 DIAGNOSIS — E049 Nontoxic goiter, unspecified: Secondary | ICD-10-CM | POA: Diagnosis not present

## 2015-03-01 DIAGNOSIS — F259 Schizoaffective disorder, unspecified: Secondary | ICD-10-CM

## 2015-03-01 DIAGNOSIS — E785 Hyperlipidemia, unspecified: Secondary | ICD-10-CM

## 2015-03-01 DIAGNOSIS — R32 Unspecified urinary incontinence: Secondary | ICD-10-CM

## 2015-03-01 DIAGNOSIS — E119 Type 2 diabetes mellitus without complications: Secondary | ICD-10-CM

## 2015-03-01 DIAGNOSIS — R030 Elevated blood-pressure reading, without diagnosis of hypertension: Secondary | ICD-10-CM

## 2015-03-01 DIAGNOSIS — F200 Paranoid schizophrenia: Secondary | ICD-10-CM | POA: Diagnosis not present

## 2015-03-01 DIAGNOSIS — R748 Abnormal levels of other serum enzymes: Secondary | ICD-10-CM

## 2015-03-01 DIAGNOSIS — IMO0001 Reserved for inherently not codable concepts without codable children: Secondary | ICD-10-CM

## 2015-03-01 MED ORDER — ASPIRIN 81 MG PO TBEC: 81.0000 mg | DELAYED_RELEASE_TABLET | Freq: Every day | ORAL | Status: DC

## 2015-03-01 MED ORDER — LISINOPRIL 2.5 MG PO TABS: 2.5000 mg | ORAL_TABLET | Freq: Every day | ORAL | Status: DC

## 2015-03-01 MED ORDER — RA ONE DAILY MAXIMUM PO TABS: 1.0000 | ORAL_TABLET | Freq: Every day | ORAL | Status: DC

## 2015-03-01 MED ORDER — IBUPROFEN 200 MG PO TABS: ORAL_TABLET | ORAL | Status: DC

## 2015-03-01 MED ORDER — MINOXIDIL 5 % EX SOLN: 1.0000 "application " | Freq: Two times a day (BID) | CUTANEOUS | Status: DC

## 2015-03-01 MED ORDER — DOCUSATE SODIUM 100 MG PO CAPS: 100.0000 mg | ORAL_CAPSULE | Freq: Two times a day (BID) | ORAL | Status: DC

## 2015-03-01 NOTE — Progress Notes (Signed)
Joseph [name]  [id number] 06-09-1976 03/01/2015      Progress Note-Follow Up  Subjective  Chief Complaint  Chief Complaint  Patient presents with  . Annual Exam    HPI  Patient is a 39 y.o. male in today for routine medical care. Patient is in today brought in by the leader of his group home for annual exam. They report he is doing very well. No recent illness of acute concerns. Denies CP/palp/SOB/HA/congestion/fevers/GI or GU c/o. Taking meds as prescribed  Past Medical History  Diagnosis Date  . Anemia     post operative  . Hyperlipidemia   . Schizophrenia   . Anxiety   . Obesity   . Cellulitis     bilateral thighs  . Lupus anticoagulant positive 08/15/2011  . Antiphospholipid syndrome 08/15/2011  . Compartment syndrome   . Elevated BP 10/20/2012  . Cerumen impaction 07/13/2013    Past Surgical History  Procedure Laterality Date  . I&d extremity      left leg  . Tonsillectomy    . Wisdom tooth extraction    . Leg surgery    . Eye surgery      b/l laser eye sx 2004    Family History  Problem Relation Age of Onset  . GER disease Father   . Diverticulosis Father   . Colon cancer Neg Hx     History   Social History  . Marital Status: Single    Spouse Name: N/A  . Number of Children: 0  . Years of Education: N/A   Occupational History  .  [employer]  .      [employer]   Social History Main Topics  . Smoking status: Former Smoker -- 1.00 packs/day for 1 years    Types: Cigarettes    Start date: 07/15/2012  . Smokeless tobacco: Former Systems developer    Types: Chew  . 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 Drug Use: No  . Sexual Activity: Not Currently   Other Topics Concern  . Not on file   Social History Narrative    Current Outpatient Prescriptions on File Prior to Visit  Medication Sig Dispense Refill  . clonazePAM (KLONOPIN) 1 MG tablet Take 1 mg by mouth daily. For anxiety    .  divalproex (DEPAKOTE) 500 MG EC tablet Take 1,000 mg by mouth 2 (two) times daily. 1 in a.m., 2 at bedtime    . FLUoxetine (PROZAC) 20 MG tablet Take 1 by mouth twice daily    . OLANZapine (ZYPREXA) 20 MG tablet Take 40 mg by mouth at bedtime.     No current facility-administered medications on file prior to visit.    Allergies  Allergen Reactions  . Haldol [Haloperidol Decanoate]     Painful   . Statins     ? Related to compartment syndrome    Review of Systems  Review of Systems  Constitutional: Negative for fever, chills and malaise/fatigue.  HENT: Negative for congestion, hearing loss and nosebleeds.   Eyes: Negative for discharge.  Respiratory: Negative for cough, sputum production, shortness of breath and wheezing.   Cardiovascular: Negative for chest pain, palpitations and leg swelling.  Gastrointestinal: Negative for heartburn, nausea, vomiting, abdominal pain, diarrhea, constipation and blood in stool.  Genitourinary: Negative for dysuria, urgency, frequency and hematuria.  Musculoskeletal: Negative for myalgias, back pain and falls.  Skin: Negative  for rash.  Neurological: Negative for dizziness, tremors, sensory change, focal weakness, loss of consciousness, weakness and headaches.  Endo/Heme/Allergies: Negative for polydipsia. Does not bruise/bleed easily.  Psychiatric/Behavioral: Negative for depression and suicidal ideas. The patient is not nervous/anxious and does not have insomnia.     Objective  BP 110/82 mmHg  Pulse 66  Temp(Src) 98.7 F (37.1 C) (Oral)  Ht 6\' 2"  (1.88 m)  Wt 266 lb 2 oz (120.714 kg)  BMI 34.15 kg/m2  SpO2 98%  Physical Exam  Physical Exam  Constitutional: He is oriented to person, place, and time and well-developed, well-nourished, and in no distress. No distress.  HENT:  Head: Normocephalic and atraumatic.  Eyes: Conjunctivae are normal.  Neck: Neck supple. No thyromegaly present.  Cardiovascular: Normal rate, regular rhythm and  normal heart sounds.   No murmur heard. Pulmonary/Chest: Effort normal and breath sounds normal. No respiratory distress.  Abdominal: He exhibits no distension and no mass. There is no tenderness.  Musculoskeletal: He exhibits no edema.  Neurological: He is alert and oriented to person, place, and time.  Skin: Skin is warm.  Psychiatric: Memory, affect and judgment normal.    Lab Results  Component Value Date   TSH 2.370 02/03/2014   Lab Results  Component Value Date   WBC 6.3 02/03/2014   HGB 14.1 02/03/2014   HCT 41.0 02/03/2014   MCV 91.3 02/03/2014   PLT 229 02/03/2014   Lab Results  Component Value Date   CREATININE 0.90 12/31/2012   BUN 11 12/31/2012   NA 138 12/31/2012   K 4.6 12/31/2012   CL 103 12/31/2012   CO2 30 12/31/2012   Lab Results  Component Value Date   ALT 34 12/31/2012   AST 20 12/31/2012   ALKPHOS 59 12/31/2012   BILITOT 0.5 12/31/2012   Lab Results  Component Value Date   CHOL 170 12/31/2012   Lab Results  Component Value Date   HDL 45 12/31/2012   Lab Results  Component Value Date   LDLCALC 100* 12/31/2012   Lab Results  Component Value Date   TRIG 125 12/31/2012   Lab Results  Component Value Date   CHOLHDL 3.8 12/31/2012     Assessment & Plan  Elevated BP Well controlled. Encouraged heart healthy diet such as the DASH diet and exercise as tolerated.   Hyperlipidemia, mild Encouraged heart healthy diet, increase exercise, avoid trans fats, consider a krill oil cap daily. Follows with Dr Chalmers Cater. Suffered compartment syndrome on statin  PPD positive CXR stable  Abnormal liver enzymes Labs done at his endocrinologist in April 2016 wnl  Diabetes mellitus hgba1c acceptable, minimize simple carbs. Increase exercise as tolerated. Continue current meds, follows with endocrinology  Schizoaffective disorder Lives in group home, doing very well. No recent concerns. Aide reports he does very well at home

## 2015-03-01 NOTE — Progress Notes (Signed)
Pre visit review using our clinic review tool, if applicable. No additional management support is needed unless otherwise documented below in the visit note. 

## 2015-03-01 NOTE — Patient Instructions (Signed)
Preventive Care for Adults A healthy lifestyle and preventive care can promote health and wellness. Preventive health guidelines for men include the following key practices:  A routine yearly physical is a good way to check with your health care provider about your health and preventative screening. It is a chance to share any concerns and updates on your health and to receive a thorough exam.  Visit your dentist for a routine exam and preventative care every 6 months. Brush your teeth twice a day and floss once a day. Good oral hygiene prevents tooth decay and gum disease.  The frequency of eye exams is based on your age, health, family medical history, use of contact lenses, and other factors. Follow your health care provider's recommendations for frequency of eye exams.  Eat a healthy diet. Foods such as vegetables, fruits, whole grains, low-fat dairy products, and lean protein foods contain the nutrients you need without too many calories. Decrease your intake of foods high in solid fats, added sugars, and salt. Eat the right amount of calories for you.Get information about a proper diet from your health care provider, if necessary.  Regular physical exercise is one of the most important things you can do for your health. Most adults should get at least 150 minutes of moderate-intensity exercise (any activity that increases your heart rate and causes you to sweat) each week. In addition, most adults need muscle-strengthening exercises on 2 or more days a week.  Maintain a healthy weight. The body mass index (BMI) is a screening tool to identify possible weight problems. It provides an estimate of body fat based on height and weight. Your health care provider can find your BMI and can help you achieve or maintain a healthy weight.For adults 20 years and older:  A BMI below 18.5 is considered underweight.  A BMI of 18.5 to 24.9 is normal.  A BMI of 25 to 29.9 is considered overweight.  A BMI  of 30 and above is considered obese.  Maintain normal blood lipids and cholesterol levels by exercising and minimizing your intake of saturated fat. Eat a balanced diet with plenty of fruit and vegetables. Blood tests for lipids and cholesterol should begin at age 50 and be repeated every 5 years. If your lipid or cholesterol levels are high, you are over 50, or you are at high risk for heart disease, you may need your cholesterol levels checked more frequently.Ongoing high lipid and cholesterol levels should be treated with medicines if diet and exercise are not working.  If you smoke, find out from your health care provider how to quit. If you do not use tobacco, do not start.  Lung cancer screening is recommended for adults aged 73-80 years who are at high risk for developing lung cancer because of a history of smoking. A yearly low-dose CT scan of the lungs is recommended for people who have at least a 30-pack-year history of smoking and are a current smoker or have quit within the past 15 years. A pack year of smoking is smoking an average of 1 pack of cigarettes a day for 1 year (for example: 1 pack a day for 30 years or 2 packs a day for 15 years). Yearly screening should continue until the smoker has stopped smoking for at least 15 years. Yearly screening should be stopped for people who develop a health problem that would prevent them from having lung cancer treatment.  If you choose to drink alcohol, do not have more than  2 drinks per day. One drink is considered to be 12 ounces (355 mL) of beer, 5 ounces (148 mL) of wine, or 1.5 ounces (44 mL) of liquor.  Avoid use of street drugs. Do not share needles with anyone. Ask for help if you need support or instructions about stopping the use of drugs.  High blood pressure causes heart disease and increases the risk of stroke. Your blood pressure should be checked at least every 1-2 years. Ongoing high blood pressure should be treated with  medicines, if weight loss and exercise are not effective.  If you are 45-79 years old, ask your health care provider if you should take aspirin to prevent heart disease.  Diabetes screening involves taking a blood sample to check your fasting blood sugar level. This should be done once every 3 years, after age 45, if you are within normal weight and without risk factors for diabetes. Testing should be considered at a younger age or be carried out more frequently if you are overweight and have at least 1 risk factor for diabetes.  Colorectal cancer can be detected and often prevented. Most routine colorectal cancer screening begins at the age of 50 and continues through age 75. However, your health care provider may recommend screening at an earlier age if you have risk factors for colon cancer. On a yearly basis, your health care provider may provide home test kits to check for hidden blood in the stool. Use of a small camera at the end of a tube to directly examine the colon (sigmoidoscopy or colonoscopy) can detect the earliest forms of colorectal cancer. Talk to your health care provider about this at age 50, when routine screening begins. Direct exam of the colon should be repeated every 5-10 years through age 75, unless early forms of precancerous polyps or small growths are found.  People who are at an increased risk for hepatitis B should be screened for this virus. You are considered at high risk for hepatitis B if:  You were born in a country where hepatitis B occurs often. Talk with your health care provider about which countries are considered high risk.  Your parents were born in a high-risk country and you have not received a shot to protect against hepatitis B (hepatitis B vaccine).  You have HIV or AIDS.  You use needles to inject street drugs.  You live with, or have sex with, someone who has hepatitis B.  You are a man who has sex with other men (MSM).  You get hemodialysis  treatment.  You take certain medicines for conditions such as cancer, organ transplantation, and autoimmune conditions.  Hepatitis C blood testing is recommended for all people born from 1945 through 1965 and any individual with known risks for hepatitis C.  Practice safe sex. Use condoms and avoid high-risk sexual practices to reduce the spread of sexually transmitted infections (STIs). STIs include gonorrhea, chlamydia, syphilis, trichomonas, herpes, HPV, and human immunodeficiency virus (HIV). Herpes, HIV, and HPV are viral illnesses that have no cure. They can result in disability, cancer, and death.  If you are at risk of being infected with HIV, it is recommended that you take a prescription medicine daily to prevent HIV infection. This is called preexposure prophylaxis (PrEP). You are considered at risk if:  You are a man who has sex with other men (MSM) and have other risk factors.  You are a heterosexual man, are sexually active, and are at increased risk for HIV infection.    You take drugs by injection.  You are sexually active with a partner who has HIV.  Talk with your health care provider about whether you are at high risk of being infected with HIV. If you choose to begin PrEP, you should first be tested for HIV. You should then be tested every 3 months for as long as you are taking PrEP.  A one-time screening for abdominal aortic aneurysm (AAA) and surgical repair of large AAAs by ultrasound are recommended for men ages 32 to 67 years who are current or former smokers.  Healthy men should no longer receive prostate-specific antigen (PSA) blood tests as part of routine cancer screening. Talk with your health care provider about prostate cancer screening.  Testicular cancer screening is not recommended for adult males who have no symptoms. Screening includes self-exam, a health care provider exam, and other screening tests. Consult with your health care provider about any symptoms  you have or any concerns you have about testicular cancer.  Use sunscreen. Apply sunscreen liberally and repeatedly throughout the day. You should seek shade when your shadow is shorter than you. Protect yourself by wearing long sleeves, pants, a wide-brimmed hat, and sunglasses year round, whenever you are outdoors.  Once a month, do a whole-body skin exam, using a mirror to look at the skin on your back. Tell your health care provider about new moles, moles that have irregular borders, moles that are larger than a pencil eraser, or moles that have changed in shape or color.  Stay current with required vaccines (immunizations).  Influenza vaccine. All adults should be immunized every year.  Tetanus, diphtheria, and acellular pertussis (Td, Tdap) vaccine. An adult who has not previously received Tdap or who does not know his vaccine status should receive 1 dose of Tdap. This initial dose should be followed by tetanus and diphtheria toxoids (Td) booster doses every 10 years. Adults with an unknown or incomplete history of completing a 3-dose immunization series with Td-containing vaccines should begin or complete a primary immunization series including a Tdap dose. Adults should receive a Td booster every 10 years.  Varicella vaccine. An adult without evidence of immunity to varicella should receive 2 doses or a second dose if he has previously received 1 dose.  Human papillomavirus (HPV) vaccine. Males aged 68-21 years who have not received the vaccine previously should receive the 3-dose series. Males aged 22-26 years may be immunized. Immunization is recommended through the age of 6 years for any male who has sex with males and did not get any or all doses earlier. Immunization is recommended for any person with an immunocompromised condition through the age of 49 years if he did not get any or all doses earlier. During the 3-dose series, the second dose should be obtained 4-8 weeks after the first  dose. The third dose should be obtained 24 weeks after the first dose and 16 weeks after the second dose.  Zoster vaccine. One dose is recommended for adults aged 50 years or older unless certain conditions are present.  Measles, mumps, and rubella (MMR) vaccine. Adults born before [birth year] generally are considered immune to measles and mumps. Adults born in [birth year] or later should have 1 or more doses of MMR vaccine unless there is a contraindication to the vaccine or there is laboratory evidence of immunity to each of the three diseases. A routine second dose of MMR vaccine should be obtained at least 28 days after the first dose for students attending postsecondary  schools, health care workers, or international travelers. People who received inactivated measles vaccine or an unknown type of measles vaccine during 1963-1967 should receive 2 doses of MMR vaccine. People who received inactivated mumps vaccine or an unknown type of mumps vaccine before 1979 and are at high risk for mumps infection should consider immunization with 2 doses of MMR vaccine. Unvaccinated health care workers born before 1957 who lack laboratory evidence of measles, mumps, or rubella immunity or laboratory confirmation of disease should consider measles and mumps immunization with 2 doses of MMR vaccine or rubella immunization with 1 dose of MMR vaccine.  Pneumococcal 13-valent conjugate (PCV13) vaccine. When indicated, a person who is uncertain of his immunization history and has no record of immunization should receive the PCV13 vaccine. An adult aged 19 years or older who has certain medical conditions and has not been previously immunized should receive 1 dose of PCV13 vaccine. This PCV13 should be followed with a dose of pneumococcal polysaccharide (PPSV23) vaccine. The PPSV23 vaccine dose should be obtained at least 8 weeks after the dose of PCV13 vaccine. An adult aged 19 years or older who has certain medical conditions and  previously received 1 or more doses of PPSV23 vaccine should receive 1 dose of PCV13. The PCV13 vaccine dose should be obtained 1 or more years after the last PPSV23 vaccine dose.  Pneumococcal polysaccharide (PPSV23) vaccine. When PCV13 is also indicated, PCV13 should be obtained first. All adults aged 65 years and older should be immunized. An adult younger than age 65 years who has certain medical conditions should be immunized. Any person who resides in a nursing home or long-term care facility should be immunized. An adult smoker should be immunized. People with an immunocompromised condition and certain other conditions should receive both PCV13 and PPSV23 vaccines. People with human immunodeficiency virus (HIV) infection should be immunized as soon as possible after diagnosis. Immunization during chemotherapy or radiation therapy should be avoided. Routine use of PPSV23 vaccine is not recommended for American Indians, [place] Natives, or people younger than 65 years unless there are medical conditions that require PPSV23 vaccine. When indicated, people who have unknown immunization and have no record of immunization should receive PPSV23 vaccine. One-time revaccination 5 years after the first dose of PPSV23 is recommended for people aged 19-64 years who have chronic kidney failure, nephrotic syndrome, asplenia, or immunocompromised conditions. People who received 1-2 doses of PPSV23 before age 65 years should receive another dose of PPSV23 vaccine at age 65 years or later if at least 5 years have passed since the previous dose. Doses of PPSV23 are not needed for people immunized with PPSV23 at or after age 65 years.  Meningococcal vaccine. Adults with asplenia or persistent complement component deficiencies should receive 2 doses of quadrivalent meningococcal conjugate (MenACWY-D) vaccine. The doses should be obtained at least 2 months apart. Microbiologists working with certain meningococcal bacteria,  military recruits, people at risk during an outbreak, and people who travel to or live in countries with a high rate of meningitis should be immunized. A first-year college student up through age 21 years who is living in a residence hall should receive a dose if he did not receive a dose on or after his 16th birthday. Adults who have certain high-risk conditions should receive one or more doses of vaccine.  Hepatitis A vaccine. Adults who wish to be protected from this disease, have certain high-risk conditions, work with hepatitis A-infected animals, work in hepatitis A research labs, or   travel to or work in countries with a high rate of hepatitis A should be immunized. Adults who were previously unvaccinated and who anticipate close contact with an international adoptee during the first 60 days after arrival in the [place] from a country with a high rate of hepatitis A should be immunized.  Hepatitis B vaccine. Adults should be immunized if they wish to be protected from this disease, have certain high-risk conditions, may be exposed to blood or other infectious body fluids, are household contacts or sex partners of hepatitis B positive people, are clients or workers in certain care facilities, or travel to or work in countries with a high rate of hepatitis B.  Haemophilus influenzae type b (Hib) vaccine. A previously unvaccinated person with asplenia or sickle cell disease or having a scheduled splenectomy should receive 1 dose of Hib vaccine. Regardless of previous immunization, a recipient of a hematopoietic stem cell transplant should receive a 3-dose series 6-12 months after his successful transplant. Hib vaccine is not recommended for adults with HIV infection. Preventive Service / Frequency Ages 52 to 17  Blood pressure check.** / Every 1 to 2 years.  Lipid and cholesterol check.** / Every 5 years beginning at age 69.  Hepatitis C blood test.** / For any individual with known risks for  hepatitis C.  Skin self-exam. / Monthly.  Influenza vaccine. / Every year.  Tetanus, diphtheria, and acellular pertussis (Tdap, Td) vaccine.** / Consult your health care provider. 1 dose of Td every 10 years.  Varicella vaccine.** / Consult your health care provider.  HPV vaccine. / 3 doses over 6 months, if 72 or younger.  Measles, mumps, rubella (MMR) vaccine.** / You need at least 1 dose of MMR if you were born in 1957 or later. You may also need a second dose.  Pneumococcal 13-valent conjugate (PCV13) vaccine.** / Consult your health care provider.  Pneumococcal polysaccharide (PPSV23) vaccine.** / 1 to 2 doses if you smoke cigarettes or if you have certain conditions.  Meningococcal vaccine.** / 1 dose if you are age 35 to 60 years and a Market researcher living in a residence hall, or have one of several medical conditions. You may also need additional booster doses.  Hepatitis A vaccine.** / Consult your health care provider.  Hepatitis B vaccine.** / Consult your health care provider.  Haemophilus influenzae type b (Hib) vaccine.** / Consult your health care provider. Ages 35 to 8  Blood pressure check.** / Every 1 to 2 years.  Lipid and cholesterol check.** / Every 5 years beginning at age 57.  Lung cancer screening. / Every year if you are aged 44-80 years and have a 30-pack-year history of smoking and currently smoke or have quit within the past 15 years. Yearly screening is stopped once you have quit smoking for at least 15 years or develop a health problem that would prevent you from having lung cancer treatment.  Fecal occult blood test (FOBT) of stool. / Every year beginning at age 55 and continuing until age 73. You may not have to do this test if you get a colonoscopy every 10 years.  Flexible sigmoidoscopy** or colonoscopy.** / Every 5 years for a flexible sigmoidoscopy or every 10 years for a colonoscopy beginning at age 28 and continuing until age  1.  Hepatitis C blood test.** / For all people born from [birth year] through 1965 and any individual with known risks for hepatitis C.  Skin self-exam. / Monthly.  Influenza vaccine. / Every  year.  Tetanus, diphtheria, and acellular pertussis (Tdap/Td) vaccine.** / Consult your health care provider. 1 dose of Td every 10 years.  Varicella vaccine.** / Consult your health care provider.  Zoster vaccine.** / 1 dose for adults aged 24 years or older.  Measles, mumps, rubella (MMR) vaccine.** / You need at least 1 dose of MMR if you were born in 1957 or later. You may also need a second dose.  Pneumococcal 13-valent conjugate (PCV13) vaccine.** / Consult your health care provider.  Pneumococcal polysaccharide (PPSV23) vaccine.** / 1 to 2 doses if you smoke cigarettes or if you have certain conditions.  Meningococcal vaccine.** / Consult your health care provider.  Hepatitis A vaccine.** / Consult your health care provider.  Hepatitis B vaccine.** / Consult your health care provider.  Haemophilus influenzae type b (Hib) vaccine.** / Consult your health care provider. Ages 65 and over  Blood pressure check.** / Every 1 to 2 years.  Lipid and cholesterol check.**/ Every 5 years beginning at age 11.  Lung cancer screening. / Every year if you are aged 2-80 years and have a 30-pack-year history of smoking and currently smoke or have quit within the past 15 years. Yearly screening is stopped once you have quit smoking for at least 15 years or develop a health problem that would prevent you from having lung cancer treatment.  Fecal occult blood test (FOBT) of stool. / Every year beginning at age 33 and continuing until age 89. You may not have to do this test if you get a colonoscopy every 10 years.  Flexible sigmoidoscopy** or colonoscopy.** / Every 5 years for a flexible sigmoidoscopy or every 10 years for a colonoscopy beginning at age 50 and continuing until age 63.  Hepatitis C blood  test.** / For all people born from [birth year] through 1965 and any individual with known risks for hepatitis C.  Abdominal aortic aneurysm (AAA) screening.** / A one-time screening for ages 71 to 103 years who are current or former smokers.  Skin self-exam. / Monthly.  Influenza vaccine. / Every year.  Tetanus, diphtheria, and acellular pertussis (Tdap/Td) vaccine.** / 1 dose of Td every 10 years.  Varicella vaccine.** / Consult your health care provider.  Zoster vaccine.** / 1 dose for adults aged 54 years or older.  Pneumococcal 13-valent conjugate (PCV13) vaccine.** / Consult your health care provider.  Pneumococcal polysaccharide (PPSV23) vaccine.** / 1 dose for all adults aged 14 years and older.  Meningococcal vaccine.** / Consult your health care provider.  Hepatitis A vaccine.** / Consult your health care provider.  Hepatitis B vaccine.** / Consult your health care provider.  Haemophilus influenzae type b (Hib) vaccine.** / Consult your health care provider. **Family history and personal history of risk and conditions may change your health care provider's recommendations. Document Released: 11/07/2001 Document Revised: 09/16/2013 Document Reviewed: 02/06/2011 [facility] Patient Information 2015 [place], [place]. This information is not intended to replace advice given to you by your health care provider. Make sure you discuss any questions you have with your health care provider.

## 2015-03-02 ENCOUNTER — Other Ambulatory Visit: Payer: Self-pay | Admitting: Family Medicine

## 2015-03-02 ENCOUNTER — Ambulatory Visit (HOSPITAL_BASED_OUTPATIENT_CLINIC_OR_DEPARTMENT_OTHER)
Admission: RE | Admit: 2015-03-02 | Discharge: 2015-03-02 | Disposition: A | Discharge status: Home / Self Care | Payer: Medicare Other | Source: Ambulatory Visit | Attending: Family Medicine | Admitting: Family Medicine

## 2015-03-02 DIAGNOSIS — R7611 Nonspecific reaction to tuberculin skin test without active tuberculosis: Secondary | ICD-10-CM

## 2015-03-02 DIAGNOSIS — R221 Localized swelling, mass and lump, neck: Secondary | ICD-10-CM | POA: Diagnosis not present

## 2015-03-02 DIAGNOSIS — E049 Nontoxic goiter, unspecified: Secondary | ICD-10-CM

## 2015-03-02 LAB — URINALYSIS
Bilirubin Urine: NEGATIVE
Hgb urine dipstick: NEGATIVE
Ketones, ur: NEGATIVE
Leukocytes, UA: NEGATIVE
Nitrite: NEGATIVE
Specific Gravity, Urine: <=1.005 — AB (ref 1.000–1.030)
Total Protein, Urine: NEGATIVE
Urine Glucose: NEGATIVE
Urobilinogen, UA: 0.2 (ref 0.0–1.0)
pH: 7 (ref 5.0–8.0)

## 2015-03-02 LAB — TSH: TSH: 2.27 u[IU]/mL (ref 0.35–4.50)

## 2015-03-02 NOTE — Telephone Encounter (Signed)
Chest xray ordered for August.

## 2015-03-03 LAB — URINE CULTURE
Colony Count: NO GROWTH
Organism ID, Bacteria: NO GROWTH

## 2015-03-07 ENCOUNTER — Encounter: Payer: Self-pay | Admitting: Family Medicine

## 2015-03-07 DIAGNOSIS — E049 Nontoxic goiter, unspecified: Secondary | ICD-10-CM | POA: Insufficient documentation

## 2015-03-07 HISTORY — DX: Nontoxic goiter, unspecified: E04.9

## 2015-03-07 NOTE — Assessment & Plan Note (Signed)
Well controlled. Encouraged heart healthy diet such as the DASH diet and exercise as tolerated.  

## 2015-03-07 NOTE — Assessment & Plan Note (Signed)
CXR: stable.

## 2015-03-07 NOTE — Assessment & Plan Note (Addendum)
Encouraged heart healthy diet, increase exercise, avoid trans fats, consider a krill oil cap daily. Follows with Dr Chalmers Cater. 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

## 2015-03-07 NOTE — Assessment & Plan Note (Signed)
Lives in group home, doing very well. No recent concerns. Aide reports he does very well at home

## 2015-03-07 NOTE — Assessment & Plan Note (Signed)
hgba1c acceptable, minimize simple carbs. Increase exercise as tolerated. Continue current meds, follows with endocrinology 

## 2015-03-07 NOTE — Assessment & Plan Note (Signed)
Labs done at his endocrinologist in April 2016 wnl

## 2015-03-11 DIAGNOSIS — E663 Overweight: Secondary | ICD-10-CM | POA: Diagnosis not present

## 2015-03-11 DIAGNOSIS — E119 Type 2 diabetes mellitus without complications: Secondary | ICD-10-CM | POA: Diagnosis not present

## 2015-03-22 ENCOUNTER — Other Ambulatory Visit: Payer: Self-pay

## 2015-04-01 DIAGNOSIS — F200 Paranoid schizophrenia: Secondary | ICD-10-CM | POA: Diagnosis not present

## 2015-04-20 DIAGNOSIS — E663 Overweight: Secondary | ICD-10-CM | POA: Diagnosis not present

## 2015-04-20 DIAGNOSIS — E780 Pure hypercholesterolemia: Secondary | ICD-10-CM | POA: Diagnosis not present

## 2015-04-20 DIAGNOSIS — E119 Type 2 diabetes mellitus without complications: Secondary | ICD-10-CM | POA: Diagnosis not present

## 2015-05-11 DIAGNOSIS — Z23 Encounter for immunization: Secondary | ICD-10-CM | POA: Diagnosis not present

## 2015-05-13 ENCOUNTER — Encounter: Payer: Self-pay | Admitting: Family Medicine

## 2015-05-21 DIAGNOSIS — F200 Paranoid schizophrenia: Secondary | ICD-10-CM | POA: Diagnosis not present

## 2015-06-01 DIAGNOSIS — E119 Type 2 diabetes mellitus without complications: Secondary | ICD-10-CM | POA: Diagnosis not present

## 2015-06-01 DIAGNOSIS — E663 Overweight: Secondary | ICD-10-CM | POA: Diagnosis not present

## 2015-06-09 ENCOUNTER — Telehealth: Payer: Self-pay | Admitting: Family Medicine

## 2015-06-09 NOTE — Telephone Encounter (Signed)
Caller name: [facility])  Relationship to patient:  Can be reached: [phone]  Pharmacy:  Reason for call: she states that pt had an ortho appt today and was told that he needs a new script for his AFO (leg brace). They would like to have it Fax to: [phone] to take to his next appt w/ him.

## 2015-06-09 NOTE — Telephone Encounter (Signed)
Attempted to call twice for more information and received a busy signal. Will attempt later.

## 2015-06-11 NOTE — Telephone Encounter (Signed)
Spoke with Joseph [name] states pt was previously followed by Dr. Oneida [name], Joseph [name] and received rx for a Toe Off AFO brace.   Please advise on if this is something you are willing to fill or if he needs another appointment. Thank you.

## 2015-06-11 NOTE — Telephone Encounter (Signed)
Do not routinely write for AFOs should probably return to Dr Oneida Alar to make sure the AFO does not need to altered etc

## 2015-06-11 NOTE — Telephone Encounter (Signed)
LMOVM

## 2015-06-14 ENCOUNTER — Ambulatory Visit (INDEPENDENT_AMBULATORY_CARE_PROVIDER_SITE_OTHER): Payer: Medicare Other | Admitting: Adult Health

## 2015-06-14 ENCOUNTER — Telehealth: Payer: Self-pay | Admitting: Family Medicine

## 2015-06-14 ENCOUNTER — Telehealth: Payer: Self-pay | Admitting: Adult Health

## 2015-06-14 ENCOUNTER — Encounter: Payer: Self-pay | Admitting: Adult Health

## 2015-06-14 VITALS — BP 120/64 | HR 66 | Temp 97.7°F | Ht 74.0 in | Wt 263.0 lb

## 2015-06-14 DIAGNOSIS — G4730 Sleep apnea, unspecified: Secondary | ICD-10-CM

## 2015-06-14 DIAGNOSIS — G4733 Obstructive sleep apnea (adult) (pediatric): Secondary | ICD-10-CM

## 2015-06-14 NOTE — Patient Instructions (Signed)
Continue on CPAP At bedtime   Goal is at least 6 hours each night .  Work on [employer] loss-keep up good work  Do not drive if sleepy.  Fax download to our office  follow up Dr. Elsworth Soho  In  1 year and As needed

## 2015-06-14 NOTE — Assessment & Plan Note (Signed)
Work on weight loss.

## 2015-06-14 NOTE — Progress Notes (Signed)
   Subjective:    Patient ID: Joseph [name], male    DOB: 11-27-1975, 39 y.o.   MRN: [id number]  HPI  81 -year-old man with schizophrenia who lives in a group home-[facility].  PSG 08/2006 - 240 pounds- AHI of 10 events per hour with mild oxygen desaturation to lowest of 87%. This was corrected by CPAP of 6 cm with a full face mask.  However he stopped using his machine and misplaced his machine between moving from the group home back to his house.  PSG 08/2013 showed AHI 62/h, predom hypopneas corrected by 12 cm Set up with CPAP 12cm, med FF mask    06/14/2015 Follow up OSA  Pt returns for annual follow up for sleep apnea On CPAP At bedtime   Download is requested, caregiver has copy and will fax to our office  Uses CPAP on average 6-8 hours nightly, mask fits fine.  Pt would like to discuss settings- feels too much pressure .  Has been dieting , lost 20lbs over last year.  Discussed no driving if sleepy.  No chest pain , orthopnea or edema  Got flu shot already    Review of Systems neg for any significant sore throat, dysphagia, itching, sneezing, nasal congestion or excess/ purulent secretions, fever, chills, sweats, unintended wt loss, pleuritic or exertional cp, hempoptysis, orthopnea pnd or change in chronic leg swelling. Also denies presyncope, palpitations, heartburn, abdominal pain, nausea, vomiting, diarrhea or change in bowel or urinary habits, dysuria,hematuria, rash, arthralgias, visual complaints, headache, numbness weakness or ataxia.     Objective:   Physical Exam  pleasant HEENT - no thrush, no post nasal drip , Class 2 airway  no lymphadenopathy CVS- s1s2 nml, no murmur, no JVD RS- clear, no crackles or rhonchi Abd- soft, non-tender, no organomegaly CNS- non focal Ext - no clubbing, cyanosis or edema       Assessment & Plan:

## 2015-06-14 NOTE — Telephone Encounter (Signed)
[place] and she is going to call the [facility] and speak to Willette Alma to have him give her the specifics in order for PCP to know what to write for.  She will call back with the detailed information.

## 2015-06-14 NOTE — Telephone Encounter (Signed)
I never spoke with anyone. Answered a phone note saying I do not usually write the prescriptions for these so the specialists typically work with the orthosis people. What company makes his orthosis? They may be able to guide [place] some but this is just not something I typically manage and would not know what to order for him.

## 2015-06-14 NOTE — Assessment & Plan Note (Signed)
Doing well on CPAP   Plan  Continue on CPAP At bedtime   Goal is at least 6 hours each night .  Work on [employer] loss-keep up good work  Do not drive if sleepy.  Fax download to our office  follow up Dr. Elsworth Soho  In  1 year and As needed

## 2015-06-14 NOTE — Telephone Encounter (Signed)
Joseph [name], program mgr with [facility] # [phone]  She states you spoke to [facility] last week needing script for [place] to get leg braces from [facility]. They aren't able to get without the RX. Please call her back.

## 2015-06-14 NOTE — Telephone Encounter (Signed)
Per TP C/o too much pressure Mask is new Place order to lower to 11cm H2O pressure  DME: [facility]  Called pt's home number and spoke with Arbie Cookey Reviewed results and recs She voiced understanding and had no further questions Order was placed to DME Nothing further needed

## 2015-06-15 NOTE — Progress Notes (Signed)
Reviewed & agree with plan  

## 2015-06-18 DIAGNOSIS — F200 Paranoid schizophrenia: Secondary | ICD-10-CM | POA: Diagnosis not present

## 2015-06-22 ENCOUNTER — Encounter: Payer: Self-pay | Admitting: Sports Medicine

## 2015-06-22 ENCOUNTER — Ambulatory Visit (INDEPENDENT_AMBULATORY_CARE_PROVIDER_SITE_OTHER): Payer: Medicare Other | Admitting: Sports Medicine

## 2015-06-22 VITALS — BP 111/73 | Ht 74.0 in | Wt 260.0 lb

## 2015-06-22 DIAGNOSIS — M21371 Foot drop, right foot: Secondary | ICD-10-CM

## 2015-06-22 DIAGNOSIS — M21372 Foot drop, left foot: Secondary | ICD-10-CM | POA: Diagnosis not present

## 2015-06-22 NOTE — Assessment & Plan Note (Signed)
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 Current AFOs he has been using are in need of replacement due to standard wear and tear. It appears that patient has had great results with the current AFOs, he has been able to stay relatively active, and has lost ~20lbs. There are no indications to change AFO type/brand at this time. - Hand Written prescription for new AFOs given to patient to give to his Prosthetist/Orthotist, Willette Alma at the [facility]. - F/u as needed.

## 2015-06-22 NOTE — Progress Notes (Signed)
   HPI  CC: AFO prescription Patient is here today for assessment to receive a prescription for new AFOs. Patient has a PMH significant for bilateral compartment syndrome of his lower extremities, which has left him w/ bilateral foot drop. He was last seen in our office in 2014. He reports that since that time he has had some improvements. He has regained the mobility of his toes (still has foot drop). He has also been able to stay relatively active, walking every day and going to the [facility]; he believes he has lost ~20lbs so far.   He reports being very satisfied with his current AFOs. The only reason he needs new devices are due to the condition of the old pair.   ROS: denies recent trauma, injury, edema, erythema, new, numbness, new weakness, or new paresthesias.    Objective: BP 111/73 mmHg  Ht 6\' 2"  (1.88 m)  Wt 260 lb (117.935 kg)  BMI 33.37 kg/m2 Gen: NAD, alert, cooperative, and pleasant. Foot/Ankle: - No visible erythema or swelling. - Range of motion is preserved w/ toe flexion/extension, and some foot inversion/eversion. No active plantar or dorsiflexion (Full passively throughout) - Strength is 0/5 in plantar and dorsiflexion. 4/5 in eversion and inversion. 5/5 in all other directions. - Stable lateral and medial ligaments - No pain at talar dome, base of 5th MT, cuboid, navicular prominence, posterior aspects of lateral and medial malleolus - Able to walk 4 steps (with AFOs in place). Neuro: Alert and oriented, Speech clear. DTRs: +1 in patella, 0 in Achilles.  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 Current AFOs he has been using are in need of replacement due to standard wear and tear. It appears that patient has had great results with the current AFOs, he has been able to stay relatively active, and has lost ~20lbs. There are no indications to  change AFO type/brand at this time. - Hand Written prescription for new AFOs given to patient to give to his Prosthetist/Orthotist, Willette Alma at the [facility]. - F/u as needed.   Meds ordered this encounter  Medications  . FLUoxetine (PROZAC) 20 MG capsule    Sig:     Refill:  0     Joseph Leatherwood, MD,MS,  PGY2 06/22/2015 11:19 AM   Patient seen and evaluated with the above-named resident. I agree with the plan of care. Patient was given a prescription for bilateral AFO orthosis with anterior tibial panel support. He has done very well with these in the past. Follow-up as needed.

## 2015-07-22 DIAGNOSIS — F200 Paranoid schizophrenia: Secondary | ICD-10-CM | POA: Diagnosis not present

## 2015-07-23 DIAGNOSIS — F200 Paranoid schizophrenia: Secondary | ICD-10-CM | POA: Diagnosis not present

## 2015-07-27 ENCOUNTER — Encounter: Payer: Self-pay | Admitting: Adult Health

## 2015-08-03 DIAGNOSIS — E663 Overweight: Secondary | ICD-10-CM | POA: Diagnosis not present

## 2015-08-03 DIAGNOSIS — E119 Type 2 diabetes mellitus without complications: Secondary | ICD-10-CM | POA: Diagnosis not present

## 2015-10-01 DIAGNOSIS — F200 Paranoid schizophrenia: Secondary | ICD-10-CM | POA: Diagnosis not present

## 2015-10-04 DIAGNOSIS — E663 Overweight: Secondary | ICD-10-CM | POA: Diagnosis not present

## 2015-10-04 DIAGNOSIS — E7800 Pure hypercholesterolemia, unspecified: Secondary | ICD-10-CM | POA: Diagnosis not present

## 2015-11-26 DIAGNOSIS — F200 Paranoid schizophrenia: Secondary | ICD-10-CM | POA: Diagnosis not present

## 2015-12-02 DIAGNOSIS — E663 Overweight: Secondary | ICD-10-CM | POA: Diagnosis not present

## 2016-01-03 ENCOUNTER — Ambulatory Visit (INDEPENDENT_AMBULATORY_CARE_PROVIDER_SITE_OTHER): Payer: Medicare Other | Admitting: Medical

## 2016-01-03 ENCOUNTER — Telehealth: Payer: Self-pay | Admitting: Medical

## 2016-01-03 ENCOUNTER — Encounter: Payer: Self-pay | Admitting: Medical

## 2016-01-03 VITALS — BP 122/80 | HR 62 | Temp 98.0°F | Ht 72.0 in | Wt 271.0 lb

## 2016-01-03 DIAGNOSIS — H6123 Impacted cerumen, bilateral: Secondary | ICD-10-CM | POA: Diagnosis not present

## 2016-01-03 NOTE — Progress Notes (Signed)
Pre visit review using our clinic review tool, if applicable. No additional management support is needed unless otherwise documented below in the visit note. 

## 2016-01-03 NOTE — Telephone Encounter (Signed)
Pt wants to know if he can use biota botanical hair thinning treatement product. He lives at [facility]. So they need clearance from [place]. He has physical upcoming with you per staff at his home. So advised to bring bottle when he is in to see you. One of those situations where product no  fda not approved. So was not able to give definitive answer.

## 2016-01-03 NOTE — Progress Notes (Signed)
Subjective:    Patient ID: Joseph [name], male    DOB: 24-Jun-1976, 40 y.o.   MRN: [id number]  HPI  Pt in for evaluation. Pt states he had some wax in the past. Feeling blocked for one week. Decreased hearing. No fever, no chills, or sweats. No preceding uri or allergy type symptoms.  Question on biota botanicals for his thinning hair. He lives at [facility] and can't use this without our approval.   Review of Systems  Constitutional: Negative for fever, chills and fatigue.  HENT: Negative for congestion and drooling.        Ear wax blockage on both sides.  Respiratory: Negative for cough, chest tightness, shortness of breath and wheezing.   Cardiovascular: Negative for chest pain and palpitations.  Gastrointestinal: Negative for abdominal pain.  Musculoskeletal: Negative for back pain.  Skin: Negative for rash.  Neurological: Negative for dizziness and headaches.  Psychiatric/Behavioral: Negative for confusion and agitation.     Past Medical History  Diagnosis Date  . Anemia     post operative  . Hyperlipidemia   . Schizophrenia ([place])   . Anxiety   . Obesity   . Cellulitis     bilateral thighs  . Lupus anticoagulant positive 08/15/2011  . Antiphospholipid syndrome ([place]) 08/15/2011  . 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[place])   . Elevated BP 10/20/2012  . Cerumen impaction 07/13/2013  . Enlarged thyroid 03/07/2015    Social History   Social History  . Marital Status: Single    Spouse Name: N/A  . Number of Children: 0  . Years of Education: N/A   Occupational History  .  [employer]  .      [employer]   Social History Main Topics  . Smoking status: Former Smoker -- 1.00 packs/day for 1 years    Types: Cigarettes    Start date: 07/15/2012    Quit date: 06/13/2013  . Smokeless tobacco: Former Systems developer    Types: Chew  . 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 Drug Use: No  . Sexual Activity: Not Currently   Other  Topics Concern  . Not on file   Social History Narrative    Past Surgical History  Procedure Laterality Date  . I&d extremity      left leg  . Tonsillectomy    . Wisdom tooth extraction    . Leg surgery    . Eye surgery      b/l laser eye sx 2004    Family History  Problem Relation Age of Onset  . GER disease Father   . Diverticulosis Father   . Colon cancer Neg Hx     Allergies  Allergen Reactions  . Haldol [Haloperidol Decanoate]     Painful   . Statins     ? Related to compartment syndrome    Current Outpatient Prescriptions on File Prior to Visit  Medication Sig Dispense Refill  . aspirin (RA ASPIRIN ADULT LOW STRENGTH) 81 MG EC tablet Take 1 tablet (81 mg total) by mouth daily. Swallow whole. 30 tablet 11  . clonazePAM (KLONOPIN) 1 MG tablet Take 1 mg by mouth 2 (two) times daily as needed. For anxiety    . divalproex (DEPAKOTE) 500 MG EC tablet Take 1,000 mg by mouth 2 (two) times daily. 1 in a.m., 2 at bedtime    . docusate sodium (RA COL-RITE) 100 MG capsule  Take 1 capsule (100 mg total) by mouth 2 (two) times daily. 60 capsule 11  . FLUoxetine (PROZAC) 20 MG capsule   0  . ibuprofen (RA IBUPROFEN) 200 MG tablet take 1 tablet by mouth every 6 hours if needed 100 tablet 6  . lisinopril (PRINIVIL,ZESTRIL) 2.5 MG tablet Take 1 tablet (2.5 mg total) by mouth daily. 30 tablet 11  . Multiple Vitamins-Minerals (RA ONE DAILY MAXIMUM) TABS Take 1 tablet by mouth daily. 100 tablet 3  . OLANZapine (ZYPREXA) 20 MG tablet Take 40 mg by mouth at bedtime.     No current facility-administered medications on file prior to visit.    BP 122/80 mmHg  Pulse 62  Temp(Src) 98 F (36.7 C) (Oral)  Ht 6' (1.829 m)  Wt 271 lb (122.925 kg)  BMI 36.75 kg/m2  SpO2 98%       Objective:   Physical Exam   General  Mental Status - Alert. General Appearance - Well groomed. Not in acute distress.  Skin Rashes- No Rashes. thining hair.  HEENT Head- Normal. Ear Auditory  Canal - Left- Normal. Right - Normal.Tympanic Membrane- Left- severe cerumen impaction Right- severe cerumen impaction.  Eye Sclera/Conjunctiva- Left- Normal. Right- Normal. Nose & Sinuses Nasal Mucosa- Left-  Boggy and Congested. Right-  Boggy and  Congested.Bilateral maxillary and frontal sinus pressure. Mouth & Throat Lips: Upper Lip- Normal: no dryness, cracking, pallor, cyanosis, or vesicular eruption. Lower Lip-Normal: no dryness, cracking, pallor, cyanosis or vesicular eruption. Buccal Mucosa- Bilateral- No Aphthous ulcers. Oropharynx- No Discharge or Erythema. Tonsils: Characteristics- Bilateral- No Erythema or Congestion. Size/Enlargement- Bilateral- No enlargement. Discharge- bilateral-None.  Neck Neck- Supple. No Masses.   Chest and Lung Exam Auscultation: Breath Sounds:-Clear even and unlabored.  Cardiovascular Auscultation:Rythm- Regular, rate and rhythm. Murmurs & Other Heart Sounds:Ausculatation of the heart reveal- No Murmurs.  Lymphatic Head & Neck General Head & Neck Lymphatics: Bilateral: Description- No Localized lymphadenopathy.      Assessment & Plan:  Your ears are cleared post lavage. No significant wax present. If re-accumulates then can irrigate.  Regarding you hair product will send Dr. Charlett Blake e-mail as to if she would approve.But also bring in product on your physical so Dr .Charlett Blake can review.   Follow up here as regularly scheduled or as needed.

## 2016-01-03 NOTE — Patient Instructions (Addendum)
Your ears are cleared post lavage. No significant wax present. If re-accumulates then can irrigate.  Regarding you hair product will send Dr. Charlett Blake e-mail as to if she would approve. But also bring in product on your physical so Dr .Charlett Blake can review.   Follow up here as regularly scheduled or as needed.

## 2016-01-04 DIAGNOSIS — E7800 Pure hypercholesterolemia, unspecified: Secondary | ICD-10-CM | POA: Diagnosis not present

## 2016-01-04 DIAGNOSIS — E119 Type 2 diabetes mellitus without complications: Secondary | ICD-10-CM | POA: Diagnosis not present

## 2016-01-11 DIAGNOSIS — E663 Overweight: Secondary | ICD-10-CM | POA: Diagnosis not present

## 2016-01-11 DIAGNOSIS — E119 Type 2 diabetes mellitus without complications: Secondary | ICD-10-CM | POA: Diagnosis not present

## 2016-01-11 DIAGNOSIS — E7800 Pure hypercholesterolemia, unspecified: Secondary | ICD-10-CM | POA: Diagnosis not present

## 2016-01-18 DIAGNOSIS — F200 Paranoid schizophrenia: Secondary | ICD-10-CM | POA: Diagnosis not present

## 2016-01-18 DIAGNOSIS — E119 Type 2 diabetes mellitus without complications: Secondary | ICD-10-CM | POA: Diagnosis not present

## 2016-01-21 DIAGNOSIS — F200 Paranoid schizophrenia: Secondary | ICD-10-CM | POA: Diagnosis not present

## 2016-03-07 ENCOUNTER — Ambulatory Visit (HOSPITAL_BASED_OUTPATIENT_CLINIC_OR_DEPARTMENT_OTHER)
Admission: RE | Admit: 2016-03-07 | Discharge: 2016-03-07 | Disposition: A | Discharge status: Home / Self Care | Payer: Medicare Other | Source: Ambulatory Visit | Attending: Family Medicine | Admitting: Family Medicine

## 2016-03-07 ENCOUNTER — Encounter: Payer: Self-pay | Admitting: Family Medicine

## 2016-03-07 ENCOUNTER — Ambulatory Visit (INDEPENDENT_AMBULATORY_CARE_PROVIDER_SITE_OTHER): Payer: Medicare Other | Admitting: Family Medicine

## 2016-03-07 VITALS — BP 120/78 | HR 84 | Temp 98.3°F | Ht 72.0 in | Wt 274.2 lb

## 2016-03-07 DIAGNOSIS — E669 Obesity, unspecified: Secondary | ICD-10-CM | POA: Diagnosis not present

## 2016-03-07 DIAGNOSIS — R030 Elevated blood-pressure reading, without diagnosis of hypertension: Secondary | ICD-10-CM | POA: Diagnosis not present

## 2016-03-07 DIAGNOSIS — IMO0001 Reserved for inherently not codable concepts without codable children: Secondary | ICD-10-CM

## 2016-03-07 DIAGNOSIS — E119 Type 2 diabetes mellitus without complications: Secondary | ICD-10-CM | POA: Diagnosis not present

## 2016-03-07 DIAGNOSIS — Z0000 Encounter for general adult medical examination without abnormal findings: Secondary | ICD-10-CM

## 2016-03-07 DIAGNOSIS — E1169 Type 2 diabetes mellitus with other specified complication: Secondary | ICD-10-CM

## 2016-03-07 DIAGNOSIS — R7611 Nonspecific reaction to tuberculin skin test without active tuberculosis: Secondary | ICD-10-CM

## 2016-03-07 LAB — COMPREHENSIVE METABOLIC PANEL
ALT: 28 U/L (ref 0–53)
AST: 26 U/L (ref 0–37)
Albumin: 4.3 g/dL (ref 3.5–5.2)
Alkaline Phosphatase: 41 U/L (ref 39–117)
BUN: 13 mg/dL (ref 6–23)
CO2: 27 mEq/L (ref 19–32)
Calcium: 8.9 mg/dL (ref 8.4–10.5)
Chloride: 103 mEq/L (ref 96–112)
Creatinine, Ser: 0.89 mg/dL (ref 0.40–1.50)
GFR: 99.59 mL/min (ref >60.00)
Glucose, Bld: 81 mg/dL (ref 70–99)
Potassium: 4.3 mEq/L (ref 3.5–5.1)
Sodium: 137 mEq/L (ref 135–145)
Total Bilirubin: 0.4 mg/dL (ref 0.2–1.2)
Total Protein: 6.8 g/dL (ref 6.0–8.3)

## 2016-03-07 LAB — LIPID PANEL
Cholesterol: 167 mg/dL (ref 0–200)
HDL: 52.4 mg/dL (ref >39.00)
LDL Cholesterol: 93 mg/dL (ref 0–99)
NonHDL: 114.29
Total CHOL/HDL Ratio: 3
Triglycerides: 104 mg/dL (ref 0.0–149.0)
VLDL: 20.8 mg/dL (ref 0.0–40.0)

## 2016-03-07 LAB — URINALYSIS
Bilirubin Urine: NEGATIVE
Hgb urine dipstick: NEGATIVE
Ketones, ur: NEGATIVE
Leukocytes, UA: NEGATIVE
Nitrite: NEGATIVE
Specific Gravity, Urine: <=1.005 — AB (ref 1.000–1.030)
Total Protein, Urine: NEGATIVE
Urine Glucose: NEGATIVE
Urobilinogen, UA: 0.2 (ref 0.0–1.0)
pH: 7.5 (ref 5.0–8.0)

## 2016-03-07 LAB — CBC
HCT: 40 % (ref 39.0–52.0)
Hemoglobin: 13.4 g/dL (ref 13.0–17.0)
MCHC: 33.6 g/dL (ref 30.0–36.0)
MCV: 94.5 fl (ref 78.0–100.0)
Platelets: 239 10*3/uL (ref 150.0–400.0)
RBC: 4.24 Mil/uL (ref 4.22–5.81)
RDW: 13.2 % (ref 11.5–15.5)
WBC: 7.1 10*3/uL (ref 4.0–10.5)

## 2016-03-07 LAB — TSH: TSH: 1.69 u[IU]/mL (ref 0.35–4.50)

## 2016-03-07 NOTE — Patient Instructions (Signed)

## 2016-03-07 NOTE — Progress Notes (Signed)
Pre visit review using our clinic review tool, if applicable. No additional management support is needed unless otherwise documented below in the visit note. 

## 2016-03-08 ENCOUNTER — Other Ambulatory Visit: Payer: Self-pay | Admitting: Family Medicine

## 2016-03-08 NOTE — Telephone Encounter (Signed)
Rx sent to the pharmacy by e-script.//AB/CMA 

## 2016-03-13 DIAGNOSIS — E663 Overweight: Secondary | ICD-10-CM | POA: Diagnosis not present

## 2016-03-17 ENCOUNTER — Other Ambulatory Visit: Payer: Self-pay | Admitting: Family Medicine

## 2016-03-19 ENCOUNTER — Encounter: Payer: Self-pay | Admitting: Family Medicine

## 2016-03-19 DIAGNOSIS — R7611 Nonspecific reaction to tuberculin skin test without active tuberculosis: Secondary | ICD-10-CM | POA: Insufficient documentation

## 2016-03-19 DIAGNOSIS — Z0000 Encounter for general adult medical examination without abnormal findings: Secondary | ICD-10-CM | POA: Insufficient documentation

## 2016-03-19 HISTORY — DX: Encounter for general adult medical examination without abnormal findings: Z00.00

## 2016-03-19 NOTE — Assessment & Plan Note (Signed)
Well controlled. Encouraged heart healthy diet such as the DASH diet and exercise as tolerated.  

## 2016-03-19 NOTE — Assessment & Plan Note (Signed)
Follows with endocrinology minimize simple carbs. Increase exercise as tolerated.  

## 2016-03-19 NOTE — Progress Notes (Signed)
Patient ID: Joseph [name], male   DOB: [birth year], 40 y.o.   MRN: [id number]   Subjective:    Patient ID: Joseph [name], male    DOB: October 14, 1975, 40 y.o.   MRN: [id number]  Chief Complaint  Patient presents with  . Annual Exam    HPI Patient is in today for annual exam. He is accompanied by an aide from his group home. No recent illness or acute concerns. He has been exercising nearly daily and eating well. Doing well with ADLs. Denies CP/palp/SOB/HA/congestion/fevers/GI or GU c/o. Taking meds as prescribed  Past Medical History  Diagnosis Date  . Anemia     post operative  . Hyperlipidemia   . Schizophrenia ([place])   . Anxiety   . Obesity   . Cellulitis     bilateral thighs  . Lupus anticoagulant positive 08/15/2011  . Antiphospholipid syndrome ([place]) 08/15/2011  . 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[place])   . Elevated BP 10/20/2012  . Cerumen impaction 07/13/2013  . Enlarged thyroid 03/07/2015  . H/O tobacco use, presenting hazards to health 12/01/2011    Quit October 2014   . Medicare annual wellness visit, subsequent 03/19/2016    Past Surgical History  Procedure Laterality Date  . I&d extremity      left leg  . Tonsillectomy    . Wisdom tooth extraction    . Leg surgery    . Eye surgery      b/l laser eye sx 2004    Family History  Problem Relation Age of Onset  . GER disease Father   . Diverticulosis Father   . Colon cancer Neg Hx     Social History   Social History  . Marital Status: Single    Spouse Name: N/A  . Number of Children: 0  . Years of Education: N/A   Occupational History  .  [employer]  .      [employer]   Social History Main Topics  . Smoking status: Former Smoker -- 1.00 packs/day for 1 years    Types: Cigarettes    Start date: 07/15/2012    Quit date: 06/13/2013  . Smokeless tobacco: Former Systems developer    Types: Chew  . 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 Drug Use: No  . Sexual  Activity: Not Currently   Other Topics Concern  . Not on file   Social History Narrative    Outpatient Prescriptions Prior to Visit  Medication Sig Dispense Refill  . clonazePAM (KLONOPIN) 1 MG tablet Take 1 mg by mouth 2 (two) times daily as needed. For anxiety    . divalproex (DEPAKOTE) 500 MG EC tablet Take 1,000 mg by mouth 2 (two) times daily. 1 in a.m., 2 at bedtime    . FLUoxetine (PROZAC) 20 MG capsule   0  . ibuprofen (RA IBUPROFEN) 200 MG tablet take 1 tablet by mouth every 6 hours if needed 100 tablet 6  . lisinopril (PRINIVIL,ZESTRIL) 2.5 MG tablet Take 1 tablet (2.5 mg total) by mouth daily. 30 tablet 11  . Multiple Vitamins-Minerals (RA ONE DAILY MAXIMUM) TABS Take 1 tablet by mouth daily. 100 tablet 3  . OLANZapine (ZYPREXA) 20 MG tablet Take 40 mg by mouth at bedtime.    Marland Kitchen aspirin (RA ASPIRIN ADULT LOW STRENGTH) 81 MG EC tablet Take 1 tablet (81 mg total) by mouth daily. Swallow whole. 30 tablet  11  . docusate sodium (RA COL-RITE) 100 MG capsule Take 1 capsule (100 mg total) by mouth 2 (two) times daily. 60 capsule 11   No facility-administered medications prior to visit.    Allergies  Allergen Reactions  . Haldol [Haloperidol Decanoate]     Painful   . Statins     ? Related to compartment syndrome    Review of Systems  Constitutional: Negative for fever and malaise/fatigue.  HENT: Negative for congestion.   Eyes: Negative for blurred vision.  Respiratory: Negative for shortness of breath.   Cardiovascular: Negative for chest pain, palpitations and leg swelling.  Gastrointestinal: Negative for nausea, abdominal pain and blood in stool.  Genitourinary: Negative for dysuria and frequency.  Musculoskeletal: Negative for falls.  Skin: Negative for rash.  Neurological: Negative for dizziness, loss of consciousness and headaches.  Endo/Heme/Allergies: Negative for environmental allergies.  Psychiatric/Behavioral: Negative for depression. The patient is not  nervous/anxious.        Objective:    Physical Exam  Constitutional: He is oriented to person, place, and time. He appears well-developed and well-nourished. No distress.  HENT:  Head: Normocephalic and atraumatic.  Eyes: Conjunctivae are normal.  Neck: Neck supple. No thyromegaly present.  Cardiovascular: Normal rate, regular rhythm and normal heart sounds.   No murmur heard. Pulmonary/Chest: Effort normal and breath sounds normal. No respiratory distress. He has no wheezes.  Abdominal: Soft. Bowel sounds are normal. He exhibits no mass. There is no tenderness.  Musculoskeletal: He exhibits no edema.  Lymphadenopathy:    He has no cervical adenopathy.  Neurological: He is alert and oriented to person, place, and time.  Skin: Skin is warm and dry.  Psychiatric: He has a normal mood and affect. His behavior is normal.    BP 120/78 mmHg  Pulse 84  Temp(Src) 98.3 F (36.8 C) (Oral)  Ht 6' (1.829 m)  Wt 274 lb 4 oz (124.399 kg)  BMI 37.19 kg/m2  SpO2 97% Wt Readings from Last 3 Encounters:  03/07/16 274 lb 4 oz (124.399 kg)  01/03/16 271 lb (122.925 kg)  06/22/15 260 lb (117.935 kg)     Lab Results  Component Value Date   WBC 7.1 03/07/2016   HGB 13.4 03/07/2016   HCT 40.0 03/07/2016   PLT 239.0 03/07/2016   GLUCOSE 81 03/07/2016   CHOL 167 03/07/2016   TRIG 104.0 03/07/2016   HDL 52.40 03/07/2016   LDLCALC 93 03/07/2016   ALT 28 03/07/2016   AST 26 03/07/2016   NA 137 03/07/2016   K 4.3 03/07/2016   CL 103 03/07/2016   CREATININE 0.89 03/07/2016   BUN 13 03/07/2016   CO2 27 03/07/2016   TSH 1.69 03/07/2016   HGBA1C 5.9* 12/31/2012   MICROALBUR 0.50 07/03/2013    Lab Results  Component Value Date   TSH 1.69 03/07/2016   Lab Results  Component Value Date   WBC 7.1 03/07/2016   HGB 13.4 03/07/2016   HCT 40.0 03/07/2016   MCV 94.5 03/07/2016   PLT 239.0 03/07/2016   Lab Results  Component Value Date   NA 137 03/07/2016   K 4.3 03/07/2016   CO2  27 03/07/2016   GLUCOSE 81 03/07/2016   BUN 13 03/07/2016   CREATININE 0.89 03/07/2016   BILITOT 0.4 03/07/2016   ALKPHOS 41 03/07/2016   AST 26 03/07/2016   ALT 28 03/07/2016   PROT 6.8 03/07/2016   ALBUMIN 4.3 03/07/2016   CALCIUM 8.9 03/07/2016   GFR 99.59 03/07/2016  Lab Results  Component Value Date   CHOL 167 03/07/2016   Lab Results  Component Value Date   HDL 52.40 03/07/2016   Lab Results  Component Value Date   LDLCALC 93 03/07/2016   Lab Results  Component Value Date   TRIG 104.0 03/07/2016   Lab Results  Component Value Date   CHOLHDL 3 03/07/2016   Lab Results  Component Value Date   HGBA1C 5.9* 12/31/2012       Assessment & Plan:   Problem List Items Addressed This Visit    PPD positive    CXR negative      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 Needs 7-8 hours of sleep nightly. Avoid trans fats, eat small, frequent meals every 4-5 hours with lean proteins, complex carbs and healthy fats. Minimize simple carbs. Has done a good job this past year.       Medicare annual wellness visit, subsequent    Patient denies any difficulties at home. No trouble with ADLs, depression or falls. See EMR for functional status screen and depression screen. No recent changes to vision or hearing. Is UTD with immunizations. Is UTD with screening. Discussed Advanced directives. Encouraged heart healthy diet, exercise as tolerated and adequate sleep. See patient's problem list for health risk factors to monitor. See AVS for preventative healthcare recommendation schedule. Labs ordered. CXR ordered       Elevated BP    Well controlled. Encouraged heart healthy diet such as the DASH diet and exercise as  tolerated.       Diabetes mellitus ([place])    Follows with endocrinology. minimize simple carbs. Increase exercise as tolerated.       Other Visit Diagnoses    Preventative health care        Relevant Orders    Lipid panel (Completed)    Comprehensive metabolic panel (Completed)    CBC (Completed)    TSH (Completed)    DG Chest 2 View (Completed)    Urinalysis (Completed)    Obesity        Relevant Orders    Lipid panel (Completed)    Comprehensive metabolic panel (Completed)    CBC (Completed)    TSH (Completed)    DG Chest 2 View (Completed)    Diabetes mellitus type 2 in obese (HCC)        Relevant Orders    Lipid panel (Completed)    Comprehensive metabolic panel (Completed)    CBC (Completed)    TSH (Completed)    DG Chest 2 View (Completed)    Urinalysis (Completed)       I am having Mr. Pfeuffer maintain his clonazePAM, divalproex, OLANZapine, ibuprofen, RA ONE DAILY MAXIMUM, lisinopril, and FLUoxetine.  No orders of the defined types were placed in this encounter.     Penni Homans, MD

## 2016-03-19 NOTE — Assessment & Plan Note (Signed)
CXR negative

## 2016-03-19 NOTE — Assessment & Plan Note (Signed)
Patient denies any difficulties at home. No trouble with ADLs, depression or falls. See EMR for functional status screen and depression screen. No recent changes to vision or hearing. Is UTD with immunizations. Is UTD with screening. Discussed Advanced directives. Encouraged heart healthy diet, exercise as tolerated and adequate sleep. See patient's problem list for health risk factors to monitor. See AVS for preventative healthcare recommendation schedule. Labs ordered. CXR ordered

## 2016-03-19 NOTE — Assessment & Plan Note (Signed)
Encouraged DASH diet, decrease po intake and increase exercise as tolerated. Needs 7-8 hours of sleep nightly. Avoid trans fats, eat small, frequent meals every 4-5 hours with lean proteins, complex carbs and healthy fats. Minimize simple carbs. Has done a good job this past year.

## 2016-04-03 ENCOUNTER — Other Ambulatory Visit: Payer: Self-pay | Admitting: Family Medicine

## 2016-05-17 DIAGNOSIS — R404 Transient alteration of awareness: Secondary | ICD-10-CM | POA: Diagnosis not present

## 2016-05-17 DIAGNOSIS — R531 Weakness: Secondary | ICD-10-CM | POA: Diagnosis not present

## 2016-06-02 ENCOUNTER — Other Ambulatory Visit: Payer: Self-pay | Admitting: Family Medicine

## 2016-06-08 DIAGNOSIS — Z23 Encounter for immunization: Secondary | ICD-10-CM | POA: Diagnosis not present

## 2016-06-23 ENCOUNTER — Ambulatory Visit (INDEPENDENT_AMBULATORY_CARE_PROVIDER_SITE_OTHER): Payer: Medicare Other | Admitting: Pulmonary Disease

## 2016-06-23 ENCOUNTER — Encounter: Payer: Self-pay | Admitting: Pulmonary Disease

## 2016-06-23 DIAGNOSIS — G4733 Obstructive sleep apnea (adult) (pediatric): Secondary | ICD-10-CM

## 2016-06-23 NOTE — Patient Instructions (Addendum)
It is good to see you You are doing well on your CPAP Continue on CPAP at bedtime. You appear to be benefiting from the treatment Goal is to wear for at least 4-6 hours each night for maximal clinical benefit. Continue to work on weight loss, as the link between excess weight  and sleep apnea is well established.  Do not drive if sleepy. Follow up with Dr. Elsworth Soho  In 1 year or before as needed.

## 2016-06-23 NOTE — Progress Notes (Signed)
History of Present Illness ERVIL LAINHART III is a 40 y.o. male with    06/23/2016 Follow Up Visit for CPAP Pt. Is compliant with his CPAP at bedtime. Down load indicates 94% usage. He uses his CPAP for an average of 7.5 hours a night. He is comfortable with the pressures, and feels his mask is fitting fine.His weight has been stable.No further weight loss.He denies chest pain, fever, orthopnea or edema.He has already had his flu shot this year.  Tests Down Load: CPAP 11 cm AHI is 1.5  Past medical hx Past Medical History:  Diagnosis Date  . Anemia    post operative  . Antiphospholipid syndrome ([place]) 08/15/2011  . Anxiety   . Cellulitis    bilateral thighs  . Cerumen impaction 07/13/2013  . 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 Elevated BP 10/20/2012  . Enlarged thyroid 03/07/2015  . H/O tobacco use, presenting hazards to health 12/01/2011   Quit October 2014   . Hyperlipidemia   . Lupus anticoagulant positive 08/15/2011  . Medicare annual wellness visit, subsequent 03/19/2016  . Obesity   . Schizophrenia ([place])      Past surgical hx, Family hx, Social hx all reviewed.  Current Outpatient Prescriptions on File Prior to Visit  Medication Sig  . clonazePAM (KLONOPIN) 1 MG tablet Take 1 mg by mouth 2 (two) times daily. And As needed For anxiety  . divalproex (DEPAKOTE) 500 MG EC tablet Take 1,000 mg by mouth 2 (two) times daily. 1 in a.m., 2 at bedtime  . FLUoxetine (PROZAC) 20 MG capsule   . ibuprofen (RA IBUPROFEN) 200 MG tablet take 1 tablet by mouth every 6 hours if needed  . lisinopril (PRINIVIL,ZESTRIL) 2.5 MG tablet take 1 tablet by mouth once daily  . Multiple Vitamins-Minerals (RA ONE DAILY MAXIMUM) TABS take 1 tablet by mouth once daily  . OLANZapine (ZYPREXA) 20 MG tablet Take 40 mg by mouth at bedtime.  Marland Kitchen RA ASPIRIN ADULT LOW STRENGTH 81 MG EC tablet take 1 tablet by mouth once daily SWALLOW WHOLE  . RA COL-RITE 100 MG capsule take 1 capsule by mouth twice a day   No  current facility-administered medications on file prior to visit.      Allergies  Allergen Reactions  . Haldol [Haloperidol Decanoate]     Painful   . Statins     ? Related to compartment syndrome    Review Of Systems:  Constitutional:   No  weight loss, night sweats,  Fevers, chills, fatigue, or  lassitude.  HEENT:   No headaches,  Difficulty swallowing,  Tooth/dental problems, or  Sore throat,                No sneezing, itching, ear ache, nasal congestion, post nasal drip,   CV:  No chest pain,  Orthopnea, PND, swelling in lower extremities, anasarca, dizziness, palpitations, syncope.   GI  No heartburn, indigestion, abdominal pain, nausea, vomiting, diarrhea, change in bowel habits, loss of appetite, bloody stools.   Resp: No shortness of breath with exertion or at rest.  No excess mucus, no productive cough,  No non-productive cough,  No coughing up of blood.  No change in color of mucus.  No wheezing.  No chest wall deformity  Skin: no rash or lesions.  GU: no dysuria, change in color of urine, no urgency or frequency.  No flank pain, no hematuria   MS:  No joint pain or swelling.  No decreased range of motion.  No back pain.  Psych:  No change in mood or affect. No depression or anxiety.  No memory loss.   Vital Signs BP 110/62 (BP Location: Right Arm, Cuff Size: Normal)   Pulse 70   Ht 6\' 1"  (1.854 m)   Wt 269 lb 6.4 oz (122.2 kg)   SpO2 97%   BMI 35.54 kg/m    Physical Exam:  General- No distress,  A&Ox3, pleasant, obese. ENT: No sinus tenderness, TM clear, pale nasal mucosa, no oral exudate,no post nasal drip, no LAN Cardiac: S1, S2, regular rate and rhythm, no murmur Chest: No wheeze/ rales/ dullness; no accessory muscle use, no nasal flaring, no sternal retractions Abd.: Soft Non-tender Ext: No clubbing cyanosis, edema Neuro:  normal strength Skin: No rashes, warm and dry Psych: flat affect, otherwise normal mood and  behavior   Assessment/Plan  OSA (obstructive sleep apnea) Compliant with CPAP AHI 1.5>> clearly benefiting from therapy. You are doing well on your CPAP Continue on CPAP at bedtime. You appear to be benefiting from the treatment Goal is to wear for at least 4-6 hours each night for maximal clinical benefit. Continue to work on weight loss, as the link between excess weight  and sleep apnea is well established.  Do not drive if sleepy. Follow up with Dr. Elsworth Soho  In 1 year or before as needed.  Please contact office for sooner follow up if symptoms do not improve or worsen or seek emergency care       Magdalen Spatz, NP 06/23/2016  3:45 PM   Tae is doing well on CPAP 11 cm. His caregiver from the group home reports significant improvement in his daytime somnolence, he is more alert and energetic and able to participate in group activities. His download shows good control of events of 11 cm and mild leak on the fullface mask. This does not seem to wake him up and does not seem to affect CPAP efficacy.  Continue same settings, his supplies will be renewed for a year Weight loss was again encouraged He was advised to take CPAP with him when he travels He has received the flu shot already  [facility] MD

## 2016-06-23 NOTE — Assessment & Plan Note (Signed)
Compliant with CPAP AHI 1.5>> clearly benefiting from therapy. You are doing well on your CPAP Continue on CPAP at bedtime. You appear to be benefiting from the treatment Goal is to wear for at least 4-6 hours each night for maximal clinical benefit. Continue to work on weight loss, as the link between excess weight  and sleep apnea is well established.  Do not drive if sleepy. Follow up with Dr. Elsworth Soho  In 1 year or before as needed.  Please contact office for sooner follow up if symptoms do not improve or worsen or seek emergency care

## 2016-07-05 ENCOUNTER — Encounter: Payer: Self-pay | Admitting: Pulmonary Disease

## 2016-07-07 ENCOUNTER — Other Ambulatory Visit: Payer: Self-pay | Admitting: Family Medicine

## 2016-07-07 ENCOUNTER — Encounter: Payer: Self-pay | Admitting: Family Medicine

## 2016-07-07 MED ORDER — LISINOPRIL 2.5 MG PO TABS: 2.5000 mg | ORAL_TABLET | Freq: Every day | ORAL | 11 refills | Status: DC

## 2016-07-07 MED ORDER — DOCUSATE SODIUM 100 MG PO CAPS: 100.0000 mg | ORAL_CAPSULE | Freq: Two times a day (BID) | ORAL | 6 refills | Status: DC

## 2016-07-07 MED ORDER — RA ONE DAILY MAXIMUM PO TABS: 1.0000 | ORAL_TABLET | Freq: Every day | ORAL | 3 refills | Status: DC

## 2016-07-07 MED ORDER — ASPIRIN 81 MG PO TBEC
81.0000 mg | DELAYED_RELEASE_TABLET | Freq: Every day | ORAL | 11 refills | Status: DC
Start: 2016-07-07 — End: 2017-03-12

## 2016-07-07 MED ORDER — IBUPROFEN 200 MG PO TABS: ORAL_TABLET | ORAL | 6 refills | Status: DC

## 2016-07-07 NOTE — Telephone Encounter (Signed)
Printed prescriptions for nursing home to have on patients file.  Included is [employer] and fax number to fax to once PCP has signed prescriptions.  Nursing home was ok to wait to get prescriptions once PCP returned in the office Prescript Good afternoon, back when Bluford Main had his physical over the summer the staff accompanying him from our facility failed to get the written prescriptions we are required to have in his medical file here at [facility] that you prescribe for him. I'm wondering if copies of these prescriptions could be faxed to [place] at the facility [phone] that we can update his file?   They include: Lisinopril 2.5mg  One tablet daily, Docusate Sodium 100mg  One capsule twice a day, Aspirin 81mg  One tablet daily, Multivitamin One tablet daily, and Ibuprofen 200mg  One tablet every 6 hours as needed PRN.  Thank you so much. If there are any questions feel free to call the facility and ask for Arbie Cookey or [place].   ions on counter for signature.

## 2016-07-17 NOTE — Telephone Encounter (Signed)
Faxed all prescriptions this morning.  Notified nursing home by [employer] that scripts done.

## 2016-07-25 DIAGNOSIS — E663 Overweight: Secondary | ICD-10-CM | POA: Diagnosis not present

## 2016-08-21 DIAGNOSIS — F200 Paranoid schizophrenia: Secondary | ICD-10-CM | POA: Diagnosis not present

## 2016-09-01 ENCOUNTER — Other Ambulatory Visit: Payer: Self-pay | Admitting: Family Medicine

## 2016-09-19 DIAGNOSIS — E119 Type 2 diabetes mellitus without complications: Secondary | ICD-10-CM | POA: Diagnosis not present

## 2016-09-19 DIAGNOSIS — H52203 Unspecified astigmatism, bilateral: Secondary | ICD-10-CM | POA: Diagnosis not present

## 2016-09-19 DIAGNOSIS — H5213 Myopia, bilateral: Secondary | ICD-10-CM | POA: Diagnosis not present

## 2016-11-28 DIAGNOSIS — E663 Overweight: Secondary | ICD-10-CM | POA: Diagnosis not present

## 2016-12-06 ENCOUNTER — Other Ambulatory Visit: Payer: Self-pay | Admitting: Family Medicine

## 2017-01-03 DIAGNOSIS — E7800 Pure hypercholesterolemia, unspecified: Secondary | ICD-10-CM | POA: Diagnosis not present

## 2017-01-03 DIAGNOSIS — E119 Type 2 diabetes mellitus without complications: Secondary | ICD-10-CM | POA: Diagnosis not present

## 2017-01-03 LAB — HEMOGLOBIN A1C: Hemoglobin A1C: 5.4

## 2017-01-03 LAB — CBC AND DIFFERENTIAL
Hemoglobin: 14.9 (ref 13.5–17.5)
Hemoglobin: 5.4 — AB (ref 13.5–17.5)

## 2017-01-10 DIAGNOSIS — E7800 Pure hypercholesterolemia, unspecified: Secondary | ICD-10-CM | POA: Diagnosis not present

## 2017-01-10 DIAGNOSIS — E663 Overweight: Secondary | ICD-10-CM | POA: Diagnosis not present

## 2017-01-10 DIAGNOSIS — E119 Type 2 diabetes mellitus without complications: Secondary | ICD-10-CM | POA: Diagnosis not present

## 2017-02-13 DIAGNOSIS — F200 Paranoid schizophrenia: Secondary | ICD-10-CM | POA: Diagnosis not present

## 2017-02-20 ENCOUNTER — Other Ambulatory Visit: Payer: Self-pay | Admitting: Family Medicine

## 2017-03-12 ENCOUNTER — Encounter: Payer: Self-pay | Admitting: Family Medicine

## 2017-03-12 ENCOUNTER — Ambulatory Visit (HOSPITAL_BASED_OUTPATIENT_CLINIC_OR_DEPARTMENT_OTHER)
Admission: RE | Admit: 2017-03-12 | Discharge: 2017-03-12 | Disposition: A | Discharge status: Home / Self Care | Payer: Medicare Other | Source: Ambulatory Visit | Attending: Family Medicine | Admitting: Family Medicine

## 2017-03-12 ENCOUNTER — Ambulatory Visit (INDEPENDENT_AMBULATORY_CARE_PROVIDER_SITE_OTHER): Payer: Medicare Other | Admitting: Family Medicine

## 2017-03-12 VITALS — BP 122/65 | HR 71 | Temp 98.5°F | Resp 18 | Ht 72.0 in | Wt 261.6 lb

## 2017-03-12 DIAGNOSIS — G4733 Obstructive sleep apnea (adult) (pediatric): Secondary | ICD-10-CM

## 2017-03-12 DIAGNOSIS — D509 Iron deficiency anemia, unspecified: Secondary | ICD-10-CM

## 2017-03-12 DIAGNOSIS — Z0000 Encounter for general adult medical examination without abnormal findings: Secondary | ICD-10-CM | POA: Diagnosis not present

## 2017-03-12 DIAGNOSIS — R7611 Nonspecific reaction to tuberculin skin test without active tuberculosis: Secondary | ICD-10-CM

## 2017-03-12 DIAGNOSIS — R918 Other nonspecific abnormal finding of lung field: Secondary | ICD-10-CM | POA: Insufficient documentation

## 2017-03-12 DIAGNOSIS — M79A29 Nontraumatic compartment syndrome of unspecified lower extremity: Secondary | ICD-10-CM

## 2017-03-12 DIAGNOSIS — J9811 Atelectasis: Secondary | ICD-10-CM | POA: Diagnosis not present

## 2017-03-12 DIAGNOSIS — E785 Hyperlipidemia, unspecified: Secondary | ICD-10-CM

## 2017-03-12 DIAGNOSIS — Z79899 Other long term (current) drug therapy: Secondary | ICD-10-CM

## 2017-03-12 LAB — CBC
HCT: 42.3 % (ref 39.0–52.0)
Hemoglobin: 14.1 g/dL (ref 13.0–17.0)
MCHC: 33.4 g/dL (ref 30.0–36.0)
MCV: 95.8 fl (ref 78.0–100.0)
Platelets: 242 10*3/uL (ref 150.0–400.0)
RBC: 4.41 Mil/uL (ref 4.22–5.81)
RDW: 12.5 % (ref 11.5–15.5)
WBC: 7.6 10*3/uL (ref 4.0–10.5)

## 2017-03-12 LAB — VALPROIC ACID LEVEL: Valproic Acid Lvl: 63 mg/L (ref 50.0–100.0)

## 2017-03-12 MED ORDER — LISINOPRIL 2.5 MG PO TABS: 2.5000 mg | ORAL_TABLET | Freq: Every day | ORAL | 11 refills | Status: DC

## 2017-03-12 MED ORDER — IBUPROFEN 200 MG PO TABS: ORAL_TABLET | ORAL | 6 refills | Status: DC

## 2017-03-12 MED ORDER — RA ONE DAILY MAXIMUM PO TABS: 1.0000 | ORAL_TABLET | Freq: Every day | ORAL | 3 refills | Status: DC

## 2017-03-12 MED ORDER — DOCUSATE SODIUM 100 MG PO CAPS: 100.0000 mg | ORAL_CAPSULE | Freq: Two times a day (BID) | ORAL | 2 refills | Status: DC

## 2017-03-12 MED ORDER — ASPIRIN 81 MG PO TBEC: 81.0000 mg | DELAYED_RELEASE_TABLET | Freq: Every day | ORAL | 11 refills | Status: DC

## 2017-03-12 NOTE — Assessment & Plan Note (Deleted)
Encouraged heart healthy diet, increase exercise, avoid trans fats, consider a krill oil cap daily. Reviewed labs from endocrinology

## 2017-03-12 NOTE — Assessment & Plan Note (Signed)
Encouraged heart healthy diet, increase exercise, avoid trans fats, consider a krill oil cap daily. Reviewed labs from endocrinology

## 2017-03-12 NOTE — Progress Notes (Signed)
Subjective:  I acted as a Education administrator for Dr. Charlett Blake. [place], [place]  Patient ID: Joseph [name], male    DOB: 03/16/1976, 41 y.o.   MRN: [id number]  No chief complaint on file.   HPI  Patient is in today for an Annual wellness visit. Patient states his right thumb has pain or pops with movement. Patient denies any difficulties at home. No trouble with ADLs, depression or falls. See EMR for functional status screen and depression screen. No recent changes to vision or hearing. Is UTD with immunizations. Is UTD with screening. Discussed Advanced Directives. Encouraged heart healthy diet, exercise as tolerated and adequate sleep. See patient's problem list for health risk factors to monitor. See AVS for preventative healthcare recommendation schedule. He continues to do well in his group home. He is exercising by walking 7 days a week [facility]. He is eating better and minimizing simple carbs.    Patient Care Team: Mosie Lukes, MD as PCP - General (Family Medicine) Himmelrich, Bryson Ha, RD (Inactive) as Dietitian   Past Medical History:  Diagnosis Date  . Anemia    post operative  . Antiphospholipid syndrome ([place]) 08/15/2011  . Anxiety   . Cellulitis    bilateral thighs  . Cerumen impaction 07/13/2013  . 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 Elevated BP 10/20/2012  . Enlarged thyroid 03/07/2015  . H/O tobacco use, presenting hazards to health 12/01/2011   Quit October 2014   . Hyperlipidemia   . Lupus anticoagulant positive 08/15/2011  . Medicare annual wellness visit, subsequent 03/19/2016  . Obesity   . Schizophrenia [facility])     Past Surgical History:  Procedure Laterality Date  . eye surgery     b/l laser eye sx 2004  . I&D EXTREMITY     left leg  . LEG SURGERY    . TONSILLECTOMY    . WISDOM TOOTH EXTRACTION      Family History  Problem Relation Age of Onset  . GER disease Father   . Diverticulosis Father   . Colon cancer Neg Hx     Social History    Social History  . Marital status: Single    Spouse name: N/A  . Number of children: 0  . Years of education: N/A   Occupational History  .  [employer]  .      [employer]   Social History Main Topics  . Smoking status: Former Smoker    Packs/day: 1.00    Years: 1.00    Types: Cigarettes    Start date: 07/15/2012    Quit date: 06/13/2013  . Smokeless tobacco: Former Systems developer    Types: Chew  . 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 Drug use: No  . Sexual activity: Not Currently   Other Topics Concern  . Not on file   Social History Narrative  . No narrative on file    Outpatient Medications Prior to Visit  Medication Sig Dispense Refill  . clonazePAM (KLONOPIN) 1 MG tablet Take 1 mg by mouth 2 (two) times daily. And As needed For anxiety    . divalproex (DEPAKOTE) 500 MG EC tablet Take 1,000 mg by mouth 2 (two) times daily. 1 in a.m., 2 at bedtime    . FLUoxetine (PROZAC) 20 MG capsule   0  . OLANZapine (ZYPREXA) 20 MG tablet  Take 40 mg by mouth at bedtime.    Marland Kitchen aspirin (RA ASPIRIN ADULT LOW STRENGTH) 81 MG EC tablet Take 1 tablet (81 mg total) by mouth daily. Swallow whole. 30 tablet 11  . ibuprofen (RA IBUPROFEN) 200 MG tablet take 1 tablet by mouth every 6 hours if needed 100 tablet 6  . lisinopril (PRINIVIL,ZESTRIL) 2.5 MG tablet Take 1 tablet (2.5 mg total) by mouth daily. 30 tablet 11  . Multiple Vitamins-Minerals (RA ONE DAILY MAXIMUM) TABS Take 1 tablet by mouth daily. 100 tablet 3  . RA COL-RITE 100 MG capsule take 1 capsule by mouth twice a day 60 capsule 2   No facility-administered medications prior to visit.     Allergies  Allergen Reactions  . Haldol [Haloperidol Decanoate]     Painful   . Statins     ? Related to compartment syndrome    Review of Systems  Constitutional: Negative for fever and malaise/fatigue.  HENT: Negative for congestion.   Eyes: Negative for blurred vision.   Respiratory: Negative for cough and shortness of breath.   Cardiovascular: Negative for chest pain, palpitations and leg swelling.  Gastrointestinal: Negative for vomiting.  Musculoskeletal: Negative for back pain.  Skin: Negative for rash.  Neurological: Negative for loss of consciousness and headaches.       Objective:    Physical Exam  Constitutional: He is oriented to person, place, and time. He appears well-developed and well-nourished. No distress.  HENT:  Head: Normocephalic and atraumatic.  Eyes: Conjunctivae are normal.  Neck: Normal range of motion. No thyromegaly present.  Cardiovascular: Normal rate and regular rhythm.   Pulmonary/Chest: Effort normal and breath sounds normal. He has no wheezes.  Abdominal: Soft. Bowel sounds are normal. There is no tenderness.  Musculoskeletal: Normal range of motion. He exhibits no edema or deformity.  Neurological: He is alert and oriented to person, place, and time.  Skin: Skin is warm and dry. He is not diaphoretic.  Psychiatric: He has a normal mood and affect.    BP 122/65 (BP Location: Left Arm, Patient Position: Sitting, Cuff Size: Normal)   Pulse 71   Temp 98.5 F (36.9 C) (Oral)   Resp 18   Ht 6' (1.829 m)   Wt 261 lb 9.6 oz (118.7 kg)   SpO2 97%   BMI 35.48 kg/m  Wt Readings from Last 3 Encounters:  03/12/17 261 lb 9.6 oz (118.7 kg)  06/23/16 269 lb 6.4 oz (122.2 kg)  03/07/16 274 lb 4 oz (124.4 kg)   BP Readings from Last 3 Encounters:  03/12/17 122/65  06/23/16 110/62  03/07/16 120/78     Immunization History  Administered Date(s) Administered  . Influenza Whole 06/01/2013  . Influenza,inj,Quad PF,36+ Mos 06/09/2014  . Influenza-Unspecified 06/11/2012, 05/11/2015, 06/07/2016  . PPD Test 01/04/2012  . Pneumococcal Polysaccharide-23 11/04/2011    Health Maintenance  Topic Date Due  . HEMOGLOBIN A1C  07/02/2013  . OPHTHALMOLOGY EXAM  01/07/2014  . PNEUMOCOCCAL POLYSACCHARIDE VACCINE (2) 11/03/2016   . FOOT EXAM  03/07/2017  . INFLUENZA VACCINE  04/25/2017  . TETANUS/TDAP  11/02/2020  . HIV Screening  Completed    Lab Results  Component Value Date   WBC 7.6 03/12/2017   HGB 14.1 03/12/2017   HCT 42.3 03/12/2017   PLT 242.0 03/12/2017   GLUCOSE 81 03/07/2016   CHOL 167 03/07/2016   TRIG 104.0 03/07/2016   HDL 52.40 03/07/2016   LDLCALC 93 03/07/2016   ALT 28 03/07/2016   AST  26 03/07/2016   NA 137 03/07/2016   K 4.3 03/07/2016   CL 103 03/07/2016   CREATININE 0.89 03/07/2016   BUN 13 03/07/2016   CO2 27 03/07/2016   TSH 1.69 03/07/2016   HGBA1C 5.4 01/03/2017   MICROALBUR 0.50 07/03/2013    Lab Results  Component Value Date   TSH 1.69 03/07/2016   Lab Results  Component Value Date   WBC 7.6 03/12/2017   HGB 14.1 03/12/2017   HCT 42.3 03/12/2017   MCV 95.8 03/12/2017   PLT 242.0 03/12/2017   Lab Results  Component Value Date   NA 137 03/07/2016   K 4.3 03/07/2016   CO2 27 03/07/2016   GLUCOSE 81 03/07/2016   BUN 13 03/07/2016   CREATININE 0.89 03/07/2016   BILITOT 0.4 03/07/2016   ALKPHOS 41 03/07/2016   AST 26 03/07/2016   ALT 28 03/07/2016   PROT 6.8 03/07/2016   ALBUMIN 4.3 03/07/2016   CALCIUM 8.9 03/07/2016   GFR 99.59 03/07/2016   Lab Results  Component Value Date   CHOL 167 03/07/2016   Lab Results  Component Value Date   HDL 52.40 03/07/2016   Lab Results  Component Value Date   LDLCALC 93 03/07/2016   Lab Results  Component Value Date   TRIG 104.0 03/07/2016   Lab Results  Component Value Date   CHOLHDL 3 03/07/2016   Lab Results  Component Value Date   HGBA1C 5.4 01/03/2017         Assessment & Plan:   Problem List Items Addressed This Visit    Iron deficiency anemia    Check cbc today was resolved at last check      Relevant Orders   CBC (Completed)   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 Resolved since stopping statins      Hyperlipidemia, mild    Encouraged heart healthy diet,  increase exercise, avoid trans fats, consider a krill oil cap daily. Reviewed labs from endocrinology      Relevant Medications   aspirin (RA ASPIRIN ADULT LOW STRENGTH) 81 MG EC tablet   lisinopril (PRINIVIL,ZESTRIL) 2.5 MG tablet   OSA (obstructive sleep apnea)   PPD positive    xr unremarkable      Morbid obesity ([place])    Encouraged DASH diet, decrease po intake and increase exercise as tolerated. Needs 7-8 hours of sleep nightly. Avoid trans fats, eat small, frequent meals every 4-5 hours with lean proteins, complex carbs and healthy fats. Minimize simple carbs. Weight down 13# since last year. Sees a nutritionist      Medicare annual wellness visit, subsequent    Patient denies any difficulties at home. No trouble with ADLs, depression or falls. See EMR for functional status screen and depression screen. No recent changes to vision or hearing. Is UTD with immunizations. Is UTD with screening. Discussed Advanced Directives. Encouraged heart healthy diet, exercise as tolerated and adequate sleep. See patient's problem list for health risk factors to monitor. See AVS for preventative healthcare recommendation schedule.       Other Visit Diagnoses    Positive PPD, treated    -  Primary   Relevant Orders   DG Chest 2 View (Completed)   High risk medication use       Relevant Orders   Valproic Acid level (Completed)      I have changed Mr. Correll RA COL-RITE to docusate sodium. I am also having him maintain his clonazePAM, divalproex, OLANZapine,  FLUoxetine, aspirin, lisinopril, RA ONE DAILY MAXIMUM, and ibuprofen.  Meds ordered this encounter  Medications  . aspirin (RA ASPIRIN ADULT LOW STRENGTH) 81 MG EC tablet    Sig: Take 1 tablet (81 mg total) by mouth daily. Swallow whole.    Dispense:  30 tablet    Refill:  11  . lisinopril (PRINIVIL,ZESTRIL) 2.5 MG tablet    Sig: Take 1 tablet (2.5 mg total) by mouth daily.    Dispense:  30 tablet    Refill:  11  . Multiple  Vitamins-Minerals (RA ONE DAILY MAXIMUM) TABS    Sig: Take 1 tablet by mouth daily.    Dispense:  100 tablet    Refill:  3  . ibuprofen (RA IBUPROFEN) 200 MG tablet    Sig: take 1 tablet by mouth every 6 hours if needed    Dispense:  100 tablet    Refill:  6  . docusate sodium (RA COL-RITE) 100 MG capsule    Sig: Take 1 capsule (100 mg total) by mouth 2 (two) times daily.    Dispense:  60 capsule    Refill:  2    CMA served as scribe during this visit. History, Physical and Plan performed by medical provider. Documentation and orders reviewed and attested to.  Penni Homans, MD

## 2017-03-12 NOTE — Assessment & Plan Note (Signed)
Patient denies any difficulties at home. No trouble with ADLs, depression or falls. See EMR for functional status screen and depression screen. No recent changes to vision or hearing. Is UTD with immunizations. Is UTD with screening. Discussed Advanced Directives. Encouraged heart healthy diet, exercise as tolerated and adequate sleep. See patient's problem list for health risk factors to monitor. See AVS for preventative healthcare recommendation schedule. 

## 2017-03-12 NOTE — Assessment & Plan Note (Signed)
Check cbc today was resolved at last check

## 2017-03-12 NOTE — Assessment & Plan Note (Signed)
Encouraged DASH diet, decrease po intake and increase exercise as tolerated. Needs 7-8 hours of sleep nightly. Avoid trans fats, eat small, frequent meals every 4-5 hours with lean proteins, complex carbs and healthy fats. Minimize simple carbs. Weight down 13# since last year. Sees a nutritionist

## 2017-03-12 NOTE — Assessment & Plan Note (Signed)
B/l from prolonged statin use. Resolved since stopping statins

## 2017-03-12 NOTE — Assessment & Plan Note (Signed)
xr unremarkable

## 2017-03-12 NOTE — Patient Instructions (Signed)
Preventive Care 18-39 Years, Male Preventive care refers to lifestyle choices and visits with your health care provider that can promote health and wellness. What does preventive care include?  A yearly physical exam. This is also called an annual well check.  Dental exams once or twice a year.  Routine eye exams. Ask your health care provider how often you should have your eyes checked.  Personal lifestyle choices, including: ? Daily care of your teeth and gums. ? Regular physical activity. ? Eating a healthy diet. ? Avoiding tobacco and drug use. ? Limiting alcohol use. ? Practicing safe sex. What happens during an annual well check? The services and screenings done by your health care provider during your annual well check will depend on your age, overall health, lifestyle risk factors, and family history of disease. Counseling Your health care provider may ask you questions about your:  Alcohol use.  Tobacco use.  Drug use.  Emotional well-being.  Home and relationship well-being.  Sexual activity.  Eating habits.  Work and work environment.  Screening You may have the following tests or measurements:  Height, weight, and BMI.  Blood pressure.  Lipid and cholesterol levels. These may be checked every 5 years starting at age 20.  Diabetes screening. This is done by checking your blood sugar (glucose) after you have not eaten for a while (fasting).  Skin check.  Hepatitis C blood test.  Hepatitis B blood test.  Sexually transmitted disease (STD) testing.  Discuss your test results, treatment options, and if necessary, the need for more tests with your health care provider. Vaccines Your health care provider may recommend certain vaccines, such as:  Influenza vaccine. This is recommended every year.  Tetanus, diphtheria, and acellular pertussis (Tdap, Td) vaccine. You may need a Td booster every 10 years.  Varicella vaccine. You may need this if you  have not been vaccinated.  HPV vaccine. If you are 26 or younger, you may need three doses over 6 months.  Measles, mumps, and rubella (MMR) vaccine. You may need at least one dose of MMR.You may also need a second dose.  Pneumococcal 13-valent conjugate (PCV13) vaccine. You may need this if you have certain conditions and have not been vaccinated.  Pneumococcal polysaccharide (PPSV23) vaccine. You may need one or two doses if you smoke cigarettes or if you have certain conditions.  Meningococcal vaccine. One dose is recommended if you are age 19-21 years and a first-year college student living in a residence hall, or if you have one of several medical conditions. You may also need additional booster doses.  Hepatitis A vaccine. You may need this if you have certain conditions or if you travel or work in places where you may be exposed to hepatitis A.  Hepatitis B vaccine. You may need this if you have certain conditions or if you travel or work in places where you may be exposed to hepatitis B.  Haemophilus influenzae type b (Hib) vaccine. You may need this if you have certain risk factors.  Talk to your health care provider about which screenings and vaccines you need and how often you need them. This information is not intended to replace advice given to you by your health care provider. Make sure you discuss any questions you have with your health care provider. Document Released: 11/07/2001 Document Revised: 05/31/2016 Document Reviewed: 07/13/2015 Elsevier Interactive Patient Education  2017 [employer]  

## 2017-04-03 DIAGNOSIS — E663 Overweight: Secondary | ICD-10-CM | POA: Diagnosis not present

## 2017-04-03 DIAGNOSIS — E7800 Pure hypercholesterolemia, unspecified: Secondary | ICD-10-CM | POA: Diagnosis not present

## 2017-04-06 ENCOUNTER — Other Ambulatory Visit: Payer: Self-pay | Admitting: Family Medicine

## 2017-04-26 ENCOUNTER — Other Ambulatory Visit: Payer: Self-pay | Admitting: Family Medicine

## 2017-05-22 ENCOUNTER — Ambulatory Visit (INDEPENDENT_AMBULATORY_CARE_PROVIDER_SITE_OTHER): Payer: Medicare Other | Admitting: Sports Medicine

## 2017-05-22 ENCOUNTER — Encounter: Payer: Self-pay | Admitting: Sports Medicine

## 2017-05-22 VITALS — BP 110/64 | Ht 74.0 in | Wt 259.0 lb

## 2017-05-22 DIAGNOSIS — M21372 Foot drop, left foot: Secondary | ICD-10-CM | POA: Diagnosis not present

## 2017-05-22 DIAGNOSIS — M21371 Foot drop, right foot: Secondary | ICD-10-CM

## 2017-05-22 NOTE — Progress Notes (Signed)
   Subjective:    Joseph [name] - 41 y.o. male MRN [id number]  Date of birth: 1976-04-19  CC: AFO Prescription    HPI  ASANI MCBURNEY [name] is here for new prescription for AFOs. 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 Last seen in clinic in 2016. Reports that since then he has had improvement in mobility with dorsiflexion at ankles. He is able to ambulate somewhat without AFOs. He walks daily and goes to the [facility] to work out 4x/week. He needs new Rx for AFOs due to condition of old pair. He has had to use duct tape to hold some of the pieces together.    - Review of Systems: No recent trauma, injury, edema, new weakness, new numbness or tingling.    reports that he quit smoking about 3 years ago. His smoking use included Cigarettes. He started smoking about 4 years ago. He has a 1.00 pack-year smoking history. He has quit using smokeless tobacco. His smokeless tobacco use included Chew. - Past Medical History: Patient Active Problem List   Diagnosis Date Noted  . Medicare annual wellness visit, subsequent 03/19/2016  . Morbid obesity ([place]) 06/14/2015  . Enlarged thyroid 03/07/2015  . 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 Cerumen impaction 07/13/2013  . Foot drop, bilateral 12/03/2012  . Elevated BP 10/20/2012  . 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  . Abnormal liver enzymes 01/14/2012  . Hyperlipidemia, mild 12/01/2011  . OSA (obstructive sleep apnea) 12/01/2011  . H/O tobacco use, presenting hazards to health 12/01/2011  . 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[place]) 11/12/2011  . Pneumonia 11/04/2011  . Schizoaffective disorder ([place]) 11/04/2011  . Diabetes mellitus ([place]) 11/04/2011  . Iron deficiency anemia 08/15/2011  . Lupus anticoagulant positive 08/15/2011      Objective:   Physical Exam Blood pressure 110/64, height 6\' 2"  (1.88 m), weight 259 lb (117.5 kg). Gen: NAD, alert, cooperative with exam, well-appearing Ankle/Feet: No  visibile erythema or edema. ROM with toe flexion/extension preserved. Ankle inversion, eversion and dorsiflexion preserved. No active plantarflexion but intact passively. Strength 0/5 in plantar flexion bilaterally. Strength 3/5 in dorsiflexion bilaterally. 5/5 in eversion and inversion. Ankle ligaments stable and intact. Able to ambulate with AFOs in place.     Assessment & Plan:   Bilateral Foot Drop:  History of chronic foot drop as a result of bilateral compartment syndrome. Current AFOs in need of replacement due to standard wear and tear from daily use. Rx for provided for two separate pairs of AFOs as patient requires different type of work shoe for safety reasons. Hand written prescription provided and patient will follow up with orthotist at [facility]. Follow up yearly for new Rxs for AFOs.     Phill Myron, D.O. 05/22/2017, 11:57 AM PGY-3, [facility]  Patient seen and evaluated with the resident. I agree with the above plan of care. Follow-up in one year or sooner if needed.

## 2017-06-15 DIAGNOSIS — Z23 Encounter for immunization: Secondary | ICD-10-CM | POA: Diagnosis not present

## 2017-06-25 ENCOUNTER — Encounter: Payer: Self-pay | Admitting: Pulmonary Disease

## 2017-06-26 ENCOUNTER — Ambulatory Visit (INDEPENDENT_AMBULATORY_CARE_PROVIDER_SITE_OTHER): Payer: Medicare Other | Admitting: Pulmonary Disease

## 2017-06-26 ENCOUNTER — Encounter: Payer: Self-pay | Admitting: Pulmonary Disease

## 2017-06-26 DIAGNOSIS — M79A29 Nontraumatic compartment syndrome of unspecified lower extremity: Secondary | ICD-10-CM

## 2017-06-26 DIAGNOSIS — G4733 Obstructive sleep apnea (adult) (pediatric): Secondary | ICD-10-CM

## 2017-06-26 NOTE — Progress Notes (Signed)
   Subjective:    Patient ID: Joseph [name], male    DOB: 06/04/1976, 41 y.o.   MRN: [id number]  HPI  Joseph [name] lives in a group home and has severe OSA well controlled on CPAP 11 cm.   He is accompanied by his caregiver from his group home, Joseph [name] today. He reports good improvement in his daytime somnolence and fatigue with his CPAP and does not have any problems with mask or pressure. Joseph [name] reports that he has 9 other roommates and they don't complain about his snoring or hear a leak from his machine. CPAP download was checked and this shows excellent compliance with no residual events on CPAP pressure of 11 cm. There is a moderate leak. Compliance is excellent with average 8.5 hours every night  She also reports that he had his braces changed on his legs and was told that he has pedal edema. He has compartment syndrome in both legs (different occasions]  Significant tests/ events reviewed  PSG 08/2006 - 240 pounds- AHI of 10 events per hour with mild oxygen desaturation to lowest of 87%. This was corrected by CPAP of 6 cm with a full face mask.   PSG 08/2013 showed AHI 62/h, predom hypopneas corrected by 12 cm   Review of Systems Patient denies significant dyspnea,cough, hemoptysis,  chest pain, palpitations, pedal edema, orthopnea, paroxysmal nocturnal dyspnea, lightheadedness, nausea, vomiting, abdominal or  leg pains      Objective:   Physical Exam  Gen. Pleasant, obese, in no distress ENT - no lesions, no post nasal drip Neck: No JVD, no thyromegaly, no carotid bruits Lungs: no use of accessory muscles, no dullness to percussion, decreased without rales or rhonchi  Cardiovascular: Rhythm regular, heart sounds  normal, no murmurs or gallops, no peripheral edema Musculoskeletal: No deformities, no cyanosis or clubbing , no tremors       Assessment & Plan:

## 2017-06-26 NOTE — Assessment & Plan Note (Signed)
I did not detect any leg swelling today. Advised him leg evaluation

## 2017-06-26 NOTE — Assessment & Plan Note (Signed)
CPAP is set at 11 cm and seems to be working well. Although he has moderate leak, this does not affect control of events and does not affect his sleep either.  Weight loss encouraged, compliance with goal of at least 4-6 hrs every night is the expectation. Advised against medications with sedative side effects Cautioned against driving when sleepy - understanding that sleepiness will vary on a day to day basis

## 2017-06-26 NOTE — Patient Instructions (Signed)
CPAP is set at 11 cm and seems to be working well

## 2017-07-02 ENCOUNTER — Ambulatory Visit (HOSPITAL_BASED_OUTPATIENT_CLINIC_OR_DEPARTMENT_OTHER)
Admission: RE | Admit: 2017-07-02 | Discharge: 2017-07-02 | Disposition: A | Discharge status: Home / Self Care | Payer: Medicare Other | Source: Ambulatory Visit | Attending: Medical | Admitting: Medical

## 2017-07-02 ENCOUNTER — Encounter: Payer: Self-pay | Admitting: Medical

## 2017-07-02 ENCOUNTER — Ambulatory Visit (INDEPENDENT_AMBULATORY_CARE_PROVIDER_SITE_OTHER): Payer: Medicare Other | Admitting: Medical

## 2017-07-02 VITALS — BP 103/56 | HR 62 | Temp 98.1°F | Resp 16 | Ht 74.0 in | Wt 267.6 lb

## 2017-07-02 DIAGNOSIS — R600 Localized edema: Secondary | ICD-10-CM

## 2017-07-02 DIAGNOSIS — M7989 Other specified soft tissue disorders: Secondary | ICD-10-CM | POA: Diagnosis not present

## 2017-07-02 DIAGNOSIS — R918 Other nonspecific abnormal finding of lung field: Secondary | ICD-10-CM | POA: Insufficient documentation

## 2017-07-02 LAB — BRAIN NATRIURETIC PEPTIDE: Pro B Natriuretic peptide (BNP): 15 pg/mL (ref 0.0–100.0)

## 2017-07-02 MED ORDER — MUPIROCIN 2 % EX OINT: TOPICAL_OINTMENT | CUTANEOUS | 0 refills | Status: DC

## 2017-07-02 NOTE — Patient Instructions (Addendum)
  For your lower extremity swelling and obvious calf asymmetry, I put in order for chest x-ray, BNP, and left lower extremity ultrasound.  We will workup some of potential diagnoses for cause of your swelling. Your history of compartment syndrome causes me to be a little more cautious,  For your small bilateral abrasions, I'm prescribing mupirocin on topical antibiotic ointment. I do think he would benefit from moderate sized bandage to cover the entire scab area but it also does appear that you might need adjustment of the leg brace again if you get recurrent breakdown in this area.   Follow-up in 7-10 days or as needed.

## 2017-07-02 NOTE — Progress Notes (Signed)
Subjective:    Patient ID: Joseph [name], male    DOB: 12/22/1975, 41 y.o.   MRN: [id number]  HPI   Pt in for some new braces just recently. Pt had some adjustment to his new braces and physical therapist thought some swelling. Pt thinks he had some swelling in his legs.  Pt states that in past for years he could press on his calf area and would leave slight indention to area. Pt weight within his historical range. No sob or wheezing.   No sob lying flat om his back.  Pt had cxr in med summer which looked good.  Pt new braces caused some small lateral abrasion with scabbing now. They were worse a week ago.    Review of Systems  Constitutional: Negative for chills, diaphoresis and fatigue.  Respiratory: Negative for cough, chest tightness, shortness of breath and wheezing.   Cardiovascular: Negative for chest pain and palpitations.  Gastrointestinal: Negative for abdominal pain, constipation and diarrhea.  Musculoskeletal: Negative for arthralgias and gait problem.  Skin:       Abrasions/scabs. See physical exam and hpi.  Neurological: Negative for dizziness, speech difficulty, weakness, light-headedness and numbness.  Hematological: Negative for adenopathy. Does not bruise/bleed easily.  Psychiatric/Behavioral: Negative for behavioral problems, confusion, hallucinations, sleep disturbance and suicidal ideas. The patient is not nervous/anxious.    Past Medical History:  Diagnosis Date  . Anemia    post operative  . Antiphospholipid syndrome ([place]) 08/15/2011  . Anxiety   . Cellulitis    bilateral thighs  . Cerumen impaction 07/13/2013  . 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[place])   . Elevated BP 10/20/2012  . Enlarged thyroid 03/07/2015  . H/O tobacco use, presenting hazards to health 12/01/2011   Quit October 2014   . Hyperlipidemia   . Lupus anticoagulant positive 08/15/2011  . Medicare annual wellness visit, subsequent 03/19/2016  . Obesity   . Schizophrenia [facility])      Social  History   Social History  . Marital status: Single    Spouse name: N/A  . Number of children: 0  . Years of education: N/A   Occupational History  .  [employer]  .      [employer]   Social History Main Topics  . Smoking status: Former Smoker    Packs/day: 1.00    Years: 1.00    Types: Cigarettes    Start date: 07/15/2012    Quit date: 06/13/2013  . Smokeless tobacco: Former Systems developer    Types: Chew  . 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 Drug use: No  . Sexual activity: Not Currently   Other Topics Concern  . Not on file   Social History Narrative  . No narrative on file    Past Surgical History:  Procedure Laterality Date  . eye surgery     b/l laser eye sx 2004  . I&D EXTREMITY     left leg  . LEG SURGERY    . TONSILLECTOMY    . WISDOM TOOTH EXTRACTION      Family History  Problem Relation Age of Onset  . GER disease Father   . Diverticulosis Father   . Colon cancer Neg Hx     Allergies  Allergen Reactions  . Haldol [Haloperidol Decanoate]     Painful   . Statins     ? Related to compartment syndrome  Current Outpatient Prescriptions on File Prior to Visit  Medication Sig Dispense Refill  . aspirin (RA ASPIRIN ADULT LOW STRENGTH) 81 MG EC tablet Take 1 tablet (81 mg total) by mouth daily. Swallow whole. 30 tablet 11  . clonazePAM (KLONOPIN) 1 MG tablet Take 1 mg by mouth 2 (two) times daily. And As needed For anxiety    . divalproex (DEPAKOTE) 500 MG EC tablet Take 1,000 mg by mouth 2 (two) times daily. 1 in a.m., 2 at bedtime    . docusate sodium (RA COL-RITE) 100 MG capsule Take 1 capsule (100 mg total) by mouth 2 (two) times daily. 60 capsule 2  . FLUoxetine (PROZAC) 20 MG capsule Take 20 mg by mouth 2 (two) times daily.   0  . ibuprofen (RA IBUPROFEN) 200 MG tablet take 1 tablet by mouth every 6 hours if needed 100 tablet 6  . lisinopril (PRINIVIL,ZESTRIL) 2.5 MG tablet Take 1 tablet  (2.5 mg total) by mouth daily. 30 tablet 11  . Multiple Vitamins-Minerals (RA ONE DAILY MAXIMUM) TABS Take 1 tablet by mouth daily. 100 tablet 3  . OLANZapine (ZYPREXA) 20 MG tablet Take 40 mg by mouth at bedtime.     No current facility-administered medications on file prior to visit.     BP (!) 103/56   Pulse 62   Temp 98.1 F (36.7 C) (Oral)   Resp 16   Ht 6\' 2"  (1.88 m)   Wt 267 lb 9.6 oz (121.4 kg)   SpO2 96%   BMI 34.36 kg/m       Objective:   Physical Exam   General- No acute distress. Pleasant patient. Neck- Full range of motion, no jvd Lungs- Clear, even and unlabored. Heart- regular rate and rhythm. Neurologic- CNII- XII grossly intact.  Lower ext- on inspection of left calf moderate swelling compared to right.  Negative homans signs on both sides. Both calfs have scarrring from prior compartment syndrome . Later upper/lateral calf both sides of small scabs present(on inspect appears where brace rubs excessively/friction) 1+ pedal edema bilaterally.     Assessment & Plan:  For your lower extremity swelling and obvious calf asymmetry, I put in order for chest x-ray, BNP, and left lower extremity ultrasound.  We will workup some of potential diagnoses for cause of your swelling. Your history of compartment syndrome causes me to be a little more cautious,  For your small bilateral abrasions, I'm prescribing mupirocin on topical antibiotic ointment. I do think he would benefit from moderate sized bandage to cover the entire scab area but it also does appear that you might need adjustment of the leg brace again if you get recurrent breakdown in this area.   Follow-up in 7-10 days or as needed.  Note swelling not compartment syndrome like. [place] ordered to evaluate left side if possible dvt.  Call [phone]

## 2017-07-03 ENCOUNTER — Ambulatory Visit (HOSPITAL_BASED_OUTPATIENT_CLINIC_OR_DEPARTMENT_OTHER): Payer: Medicare Other

## 2017-07-17 ENCOUNTER — Ambulatory Visit (INDEPENDENT_AMBULATORY_CARE_PROVIDER_SITE_OTHER): Payer: Medicare Other | Admitting: Medical

## 2017-07-17 ENCOUNTER — Encounter: Payer: Self-pay | Admitting: Medical

## 2017-07-17 VITALS — BP 109/58 | HR 73 | Temp 97.9°F | Resp 16 | Ht 74.0 in | Wt 270.0 lb

## 2017-07-17 DIAGNOSIS — M79A9 Nontraumatic compartment syndrome of other sites: Secondary | ICD-10-CM

## 2017-07-17 DIAGNOSIS — M21372 Foot drop, left foot: Secondary | ICD-10-CM

## 2017-07-17 DIAGNOSIS — M21371 Foot drop, right foot: Secondary | ICD-10-CM | POA: Diagnosis not present

## 2017-07-17 NOTE — Patient Instructions (Addendum)
Your small scab area are now healed. Your swelling looks improved. However new small scab area occurring(use mupirocin to area). Will get staff to call [facility] orthotist and see if they can give opinion on fitting/size. Appears chronic friction affecting skin.  Follow up as regularly scheduled with pcp or as needed

## 2017-07-17 NOTE — Progress Notes (Signed)
Subjective:    Patient ID: Joseph [name], male    DOB: March 29, 1976, 41 y.o.   MRN: [id number]  HPI  Pt work up for swelling of his lower legs. Test were negative. Pt was given mupirocin for small scabs on lateral calfs. The areas are now healed. Pt has braces on his legs and he states no adjustments made to the braces since placed.   [facility] [place].  Review of Systems  Constitutional: Negative for chills, fatigue and fever.  HENT: Negative for dental problem, ear discharge, ear pain, facial swelling, mouth sores, nosebleeds, postnasal drip and rhinorrhea.   Respiratory: Negative for cough, chest tightness, shortness of breath and wheezing.   Cardiovascular: Negative for chest pain and palpitations.  Genitourinary: Negative for dysuria.  Skin: Negative for rash.       Scabs on lower ext healed.But new one present just recently.  Neurological: Negative for dizziness and headaches.  Hematological: Negative for adenopathy. Does not bruise/bleed easily.  Psychiatric/Behavioral: Negative for behavioral problems and confusion.   Past Medical History:  Diagnosis Date  . Anemia    post operative  . Antiphospholipid syndrome ([place]) 08/15/2011  . Anxiety   . Cellulitis    bilateral thighs  . Cerumen impaction 07/13/2013  . 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[place])   . Elevated BP 10/20/2012  . Enlarged thyroid 03/07/2015  . H/O tobacco use, presenting hazards to health 12/01/2011   Quit October 2014   . Hyperlipidemia   . Lupus anticoagulant positive 08/15/2011  . Medicare annual wellness visit, subsequent 03/19/2016  . Obesity   . Schizophrenia [facility])      Social History   Social History  . Marital status: Single    Spouse name: N/A  . Number of children: 0  . Years of education: N/A   Occupational History  .  [employer]  .      [employer]   Social History Main Topics  . Smoking status: Former Smoker    Packs/day: 1.00    Years: 1.00   Types: Cigarettes    Start date: 07/15/2012    Quit date: 06/13/2013  . Smokeless tobacco: Former Systems developer    Types: Chew  . 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 Drug use: No  . Sexual activity: Not Currently   Other Topics Concern  . Not on file   Social History Narrative  . No narrative on file    Past Surgical History:  Procedure Laterality Date  . eye surgery     b/l laser eye sx 2004  . I&D EXTREMITY     left leg  . LEG SURGERY    . TONSILLECTOMY    . WISDOM TOOTH EXTRACTION      Family History  Problem Relation Age of Onset  . GER disease Father   . Diverticulosis Father   . Colon cancer Neg Hx     Allergies  Allergen Reactions  . Haldol [Haloperidol Decanoate]     Painful   . Statins     ? Related to compartment syndrome    Current Outpatient Prescriptions on File Prior to Visit  Medication Sig Dispense Refill  . aspirin (RA ASPIRIN ADULT LOW STRENGTH) 81 MG EC tablet Take 1 tablet (81 mg total) by mouth daily. Swallow whole. 30 tablet 11  . clonazePAM (KLONOPIN) 1 MG tablet Take  1 mg by mouth 2 (two) times daily. And As needed For anxiety    . divalproex (DEPAKOTE) 500 MG EC tablet Take 1,000 mg by mouth 2 (two) times daily. 1 in a.m., 2 at bedtime    . docusate sodium (RA COL-RITE) 100 MG capsule Take 1 capsule (100 mg total) by mouth 2 (two) times daily. 60 capsule 2  . FLUoxetine (PROZAC) 20 MG capsule Take 20 mg by mouth 2 (two) times daily.   0  . ibuprofen (RA IBUPROFEN) 200 MG tablet take 1 tablet by mouth every 6 hours if needed 100 tablet 6  . lisinopril (PRINIVIL,ZESTRIL) 2.5 MG tablet Take 1 tablet (2.5 mg total) by mouth daily. 30 tablet 11  . Multiple Vitamins-Minerals (RA ONE DAILY MAXIMUM) TABS Take 1 tablet by mouth daily. 100 tablet 3  . mupirocin ointment (BACTROBAN) 2 % Apply twice daily to scabbed area 22 g 0  . OLANZapine (ZYPREXA) 20 MG tablet Take 40 mg by mouth at bedtime.     No  current facility-administered medications on file prior to visit.     BP (!) 109/58   Pulse 73   Temp 97.9 F (36.6 C) (Oral)   Resp 16   Ht 6\' 2"  (1.88 m)   Wt 270 lb (122.5 kg)   SpO2 97%   BMI 34.67 kg/m       Objective:   Physical Exam  General- No acute distress. Pleasant patient. Neck- Full range of motion, no jvd Lungs- Clear, even and unlabored. Heart- regular rate and rhythm. Neurologic- CNII- XII grossly intact.  Lower ext- pt lateral calf small scab areas are now healed.  1+ pedal edema not present. Medial left calf small scab forming. No redness and now warmth. No dc.      Assessment & Plan:  Your small scab area are now healed. Your swelling looks improved. However new small scab area occurring(use mupirocin to area). Will get staff to call [facility] orthotist and see if they can give opinion on fitting/size. Appears chronic friction affecting skin.  Follow up as regularly scheduled with pcp or as needed  Adell Panek, Percell Miller, PA-C

## 2017-08-14 DIAGNOSIS — F200 Paranoid schizophrenia: Secondary | ICD-10-CM | POA: Diagnosis not present

## 2017-08-29 ENCOUNTER — Telehealth: Payer: Self-pay | Admitting: *Deleted

## 2017-08-29 NOTE — Telephone Encounter (Signed)
Received request for Medical records from [place], forwarded to [place] for email/scan/SLS 12/05

## 2017-09-07 ENCOUNTER — Other Ambulatory Visit: Payer: Self-pay | Admitting: Family Medicine

## 2017-12-07 ENCOUNTER — Other Ambulatory Visit: Payer: Self-pay

## 2017-12-07 ENCOUNTER — Encounter: Payer: Self-pay | Admitting: Cardiovascular Disease

## 2017-12-07 MED ORDER — DOCUSATE SODIUM 100 MG PO CAPS: 100.0000 mg | ORAL_CAPSULE | Freq: Two times a day (BID) | ORAL | 2 refills | Status: DC

## 2017-12-17 ENCOUNTER — Ambulatory Visit: Payer: Medicare Other | Admitting: Cardiovascular Disease

## 2018-01-07 DIAGNOSIS — E663 Overweight: Secondary | ICD-10-CM | POA: Diagnosis not present

## 2018-01-07 DIAGNOSIS — E7800 Pure hypercholesterolemia, unspecified: Secondary | ICD-10-CM | POA: Diagnosis not present

## 2018-01-07 DIAGNOSIS — I10 Essential (primary) hypertension: Secondary | ICD-10-CM | POA: Diagnosis not present

## 2018-01-07 DIAGNOSIS — E119 Type 2 diabetes mellitus without complications: Secondary | ICD-10-CM | POA: Diagnosis not present

## 2018-01-14 DIAGNOSIS — E7800 Pure hypercholesterolemia, unspecified: Secondary | ICD-10-CM | POA: Diagnosis not present

## 2018-01-14 DIAGNOSIS — E119 Type 2 diabetes mellitus without complications: Secondary | ICD-10-CM | POA: Diagnosis not present

## 2018-01-14 DIAGNOSIS — I10 Essential (primary) hypertension: Secondary | ICD-10-CM | POA: Diagnosis not present

## 2018-01-25 ENCOUNTER — Other Ambulatory Visit: Payer: Self-pay

## 2018-01-25 MED ORDER — LISINOPRIL 2.5 MG PO TABS: 2.5000 mg | ORAL_TABLET | Freq: Every day | ORAL | 11 refills | Status: DC

## 2018-02-04 DIAGNOSIS — F200 Paranoid schizophrenia: Secondary | ICD-10-CM | POA: Diagnosis not present

## 2018-02-08 ENCOUNTER — Ambulatory Visit (INDEPENDENT_AMBULATORY_CARE_PROVIDER_SITE_OTHER): Payer: Medicare Other | Admitting: Adult Health

## 2018-02-08 ENCOUNTER — Encounter: Payer: Self-pay | Admitting: Adult Health

## 2018-02-08 VITALS — BP 102/60 | Temp 99.6°F | Wt 263.0 lb

## 2018-02-08 DIAGNOSIS — J029 Acute pharyngitis, unspecified: Secondary | ICD-10-CM

## 2018-02-08 LAB — POCT RAPID STREP A (OFFICE): Rapid Strep A Screen: NEGATIVE

## 2018-02-08 MED ORDER — IBUPROFEN 600 MG PO TABS: 600.0000 mg | ORAL_TABLET | Freq: Three times a day (TID) | ORAL | 0 refills | Status: DC | PRN

## 2018-02-08 NOTE — Progress Notes (Signed)
Subjective:    Patient ID: Joseph [name], male    DOB: Apr 13, 1976, 42 y.o.   MRN: [id number]  Clair G[place]ing started this morning and have dramatically improved throughout the day  Sore Throat   This is a new problem. The current episode started today. The problem has been rapidly improving. There has been no fever. Associated symptoms include trouble swallowing. Pertinent negatives include no coughing, diarrhea, drooling, plugged ear sensation, shortness of breath or vomiting. He has had no exposure to strep or mono. He has tried NSAIDs for the symptoms. The treatment provided significant relief.      Review of Systems  Constitutional: Positive for chills and fatigue. Negative for activity change, appetite change, diaphoresis and fever.  HENT: Positive for sore throat and trouble swallowing. Negative for drooling, rhinorrhea, sinus pressure and sinus pain.   Respiratory: Negative.  Negative for cough, shortness of breath and wheezing.   Cardiovascular: Negative.   Gastrointestinal: Negative for diarrhea and vomiting.  Musculoskeletal: Negative.   Skin: Negative.    Past Medical History:  Diagnosis Date  . Anemia    post operative  . Antiphospholipid syndrome ([place]) 08/15/2011  . Anxiety   . Cellulitis    bilateral thighs  . Cerumen impaction 07/13/2013  . 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[place])   . Elevated BP 10/20/2012  . Enlarged thyroid 03/07/2015  . H/O tobacco use, presenting hazards to health 12/01/2011   Quit October 2014   . Hyperlipidemia   . Lupus anticoagulant positive 08/15/2011  . Medicare annual wellness visit, subsequent 03/19/2016  . Obesity   . Schizophrenia [facility])     Social History   Socioeconomic History  . Marital status: Single    Spouse name: Not on file  . Number of children: 0  . Years of education: Not on file  . Highest education level: Not on file  Occupational History    Employer: [employer]    Comment: [employer]  Social Needs  . Financial resource  strain: Not on file  . Food insecurity:    Worry: Not on file    Inability: Not on file  . Transportation needs:    Medical: Not on file    Non-medical: Not on file  Tobacco Use  . Smoking status: Former Smoker    Packs/day: 1.00    Years: 1.00    Pack years: 1.00    Types: Cigarettes    Start date: 07/15/2012    Last attempt to quit: 06/13/2013    Years since quitting: 4.6  . Smokeless tobacco: Former Systems developer    Types: Chew  Substance and Sexual Activity  . Alcohol use: No    Alcohol/week: 16.8 oz    Types: 28 Cans of beer per week    Comment: pt claims he drinks 4, 40 oz cans of malt liquor every day  . Drug use: No  . Sexual activity: Not Currently  Lifestyle  . Physical activity:    Days per week: Not on file    Minutes per session: Not on file  . Stress: Not on file  Relationships  . Social connections:    Talks on phone: Not on file    Gets together: Not on file    Attends religious service: Not on file    Active member of club or organization: Not on file    Attends meetings of clubs or organizations: Not on file    Relationship status: Not on file  . Intimate partner violence:  Fear of current or ex partner: Not on file    Emotionally abused: Not on file    Physically abused: Not on file    Forced sexual activity: Not on file  Other Topics Concern  . Not on file  Social History Narrative  . Not on file    Past Surgical History:  Procedure Laterality Date  . eye surgery     b/l laser eye sx 2004  . I&D EXTREMITY     left leg  . LEG SURGERY    . TONSILLECTOMY    . WISDOM TOOTH EXTRACTION      Family History  Problem Relation Age of Onset  . GER disease Father   . Diverticulosis Father   . Colon cancer Neg Hx     Allergies  Allergen Reactions  . Haldol [Haloperidol Decanoate]     Painful   . Statins     ? Related to compartment syndrome    Current Outpatient Medications on File Prior to Visit  Medication Sig Dispense Refill  . aspirin  (RA ASPIRIN ADULT LOW STRENGTH) 81 MG EC tablet Take 1 tablet (81 mg total) by mouth daily. Swallow whole. 30 tablet 11  . clonazePAM (KLONOPIN) 1 MG tablet Take 1 mg by mouth 2 (two) times daily. And As needed For anxiety    . divalproex (DEPAKOTE) 500 MG EC tablet Take 1,000 mg by mouth 2 (two) times daily. 1 in a.m., 2 at bedtime    . docusate sodium (RA COL-RITE) 100 MG capsule Take 1 capsule (100 mg total) by mouth 2 (two) times daily. 60 capsule 2  . FLUoxetine (PROZAC) 20 MG capsule Take 20 mg by mouth 2 (two) times daily.   0  . ibuprofen (RA IBUPROFEN) 200 MG tablet take 1 tablet by mouth every 6 hours if needed 100 tablet 6  . lisinopril (PRINIVIL,ZESTRIL) 2.5 MG tablet Take 1 tablet (2.5 mg total) by mouth daily. 30 tablet 11  . Multiple Vitamins-Minerals (RA ONE DAILY MAXIMUM) TABS Take 1 tablet by mouth daily. 100 tablet 3  . mupirocin ointment (BACTROBAN) 2 % Apply twice daily to scabbed area 22 g 0  . OLANZapine (ZYPREXA) 20 MG tablet Take 40 mg by mouth at bedtime.     No current facility-administered medications on file prior to visit.     BP 102/60   Temp 99.6 F (37.6 C) (Oral)   Wt 263 lb (119.3 kg)   BMI 33.77 kg/m       Objective:   Physical Exam  Constitutional: He appears well-developed and well-nourished.  Non-toxic appearance. He does not appear ill.  HENT:  Right Ear: Hearing and tympanic membrane normal.  Left Ear: Hearing and tympanic membrane normal.  Mouth/Throat: Uvula is midline, oropharynx is clear and moist and mucous membranes are normal. No oropharyngeal exudate, posterior oropharyngeal edema or posterior oropharyngeal erythema.  Eyes: Pupils are equal, round, and reactive to light. EOM are normal.  Neck: Normal range of motion. Neck supple.  Cardiovascular: Normal rate, regular rhythm and normal heart sounds.  Lymphadenopathy:    He has no cervical adenopathy.  Neurological: He is alert.  Skin: Skin is warm and dry.  Psychiatric: He has a  normal mood and affect. His behavior is normal.  Nursing note and vitals reviewed.     Assessment & Plan:  1. Sore throat -Symptoms had pretty much resolved by the time he into the office.  Strep test was negative.  Advised to stay hydrated and rest.  He can take Motrin 600 mg every 8 hours as needed.  Follow-up if symptoms return  - POC Rapid Strep A  Dorothyann Peng, NP

## 2018-02-22 ENCOUNTER — Other Ambulatory Visit: Payer: Self-pay

## 2018-02-22 MED ORDER — ASPIRIN 81 MG PO TBEC: 81.0000 mg | DELAYED_RELEASE_TABLET | Freq: Every day | ORAL | 2 refills | Status: DC

## 2018-02-22 MED ORDER — DOCUSATE SODIUM 100 MG PO CAPS: 100.0000 mg | ORAL_CAPSULE | Freq: Two times a day (BID) | ORAL | 2 refills | Status: DC

## 2018-02-22 MED ORDER — ASPIRIN 81 MG PO TBEC: 81.0000 mg | DELAYED_RELEASE_TABLET | Freq: Every day | ORAL | 11 refills | Status: DC

## 2018-03-04 ENCOUNTER — Ambulatory Visit: Payer: Medicare Other | Admitting: Cardiovascular Disease

## 2018-03-14 NOTE — Progress Notes (Addendum)
Subjective:   Joseph [name] is a 42 y.o. male who presents for Medicare Annual/Subsequent preventive examination.  [place] 3 days per week. 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  Review of Systems: No ROS.  Medicare Wellness Visit. Additional risk factors are reflected in the social history. Cardiac Risk Factors include: advanced age (>24men, >40 women);dyslipidemia;diabetes mellitus;male gender Sleep patterns:  Sleeps 9 hrs  Home Safety/Smoke Alarms: Feels safe in home. Smoke alarms in place.  Living environment; residence and Adult nurse:   Male:   CCS- n/a    PSA- No results found for: PSA  Dentist-Dr.Cooper 2x/yr     Objective:    Vitals: BP 120/68 (BP Location: Left Arm, Patient Position: Sitting, Cuff Size: Normal)   Pulse 67   Ht 6' (1.829 m)   Wt 264 lb (119.7 kg)   SpO2 97%   BMI 35.80 kg/m   Body mass index is 35.8 kg/m.  Advanced Directives 03/19/2018 03/19/2016 06/22/2015 06/22/2015 06/14/2015 11/03/2011  Does Patient Have a Medical Advance Directive? No No No No No Patient does not have advance directive  Would patient like information on creating a medical advance directive? Yes (MAU/Ambulatory/Procedural Areas - Information given) Yes - Educational materials given No - patient declined information No - patient declined information - -  Pre-existing out of facility DNR order (yellow form or pink MOST form) - - - - - No    Tobacco Social History   Tobacco Use  Smoking Status Former Smoker  . Packs/day: 1.00  . Years: 1.00  . Pack years: 1.00  . Types: Cigarettes  . Start date: 07/15/2012  . Last attempt to quit: 06/13/2013  . Years since quitting: 4.7  Smokeless Tobacco Current User  . Types: Chew     Ready to quit: Not Answered Counseling given: Not Answered   Clinical Intake:     Pain : No/denies pain                 Past Medical History:  Diagnosis Date  . Anemia    post operative  . Antiphospholipid syndrome ([place]) 08/15/2011  .  Anxiety   . Cellulitis    bilateral thighs  . Cerumen impaction 07/13/2013  . 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 Elevated BP 10/20/2012  . Enlarged thyroid 03/07/2015  . H/O tobacco use, presenting hazards to health 12/01/2011   Quit October 2014   . Hyperlipidemia   . Lupus anticoagulant positive 08/15/2011  . Medicare annual wellness visit, subsequent 03/19/2016  . Obesity   . Schizophrenia [facility])    Past Surgical History:  Procedure Laterality Date  . eye surgery     b/l laser eye sx 2004  . I&D EXTREMITY     left leg  . LEG SURGERY    . TONSILLECTOMY    . WISDOM TOOTH EXTRACTION     Family History  Problem Relation Age of Onset  . GER disease Father   . Diverticulosis Father   . Colon cancer Neg Hx    Social History   Socioeconomic History  . Marital status: Single    Spouse name: Not on file  . Number of children: 0  . Years of education: Not on file  . Highest education level: Not on file  Occupational History    Employer: [employer]    Comment: [employer]  Social Needs  . Financial resource strain: Not on file  . Food insecurity:    Worry: Not on file  Inability: Not on file  . Transportation needs:    Medical: Not on file    Non-medical: Not on file  Tobacco Use  . Smoking status: Former Smoker    Packs/day: 1.00    Years: 1.00    Pack years: 1.00    Types: Cigarettes    Start date: 07/15/2012    Last attempt to quit: 06/13/2013    Years since quitting: 4.7  . Smokeless tobacco: Current User    Types: Chew  Substance and Sexual Activity  . Alcohol use: No    Alcohol/week: 16.8 oz    Types: 28 Cans of beer per week    Comment: pt claims he drinks 4, 40 oz cans of malt liquor every day  . Drug use: No  . Sexual activity: Not Currently  Lifestyle  . Physical activity:    Days per week: Not on file    Minutes per session: Not on file  . Stress: Not on file  Relationships  . Social connections:    Talks on phone: Not on file    Gets  together: Not on file    Attends religious service: Not on file    Active member of club or organization: Not on file    Attends meetings of clubs or organizations: Not on file    Relationship status: Not on file  Other Topics Concern  . Not on file  Social History Narrative  . Not on file    Outpatient Encounter Medications as of 03/19/2018  Medication Sig  . aspirin (RA ASPIRIN ADULT LOW STRENGTH) 81 MG EC tablet Take 1 tablet (81 mg total) by mouth daily. Swallow whole.  . clonazePAM (KLONOPIN) 1 MG tablet Take 1 mg by mouth 2 (two) times daily. And As needed For anxiety  . divalproex (DEPAKOTE) 500 MG EC tablet Take 1,000 mg by mouth 2 (two) times daily. 1 in a.m., 2 at bedtime  . docusate sodium (RA COL-RITE) 100 MG capsule Take 1 capsule (100 mg total) by mouth 2 (two) times daily.  Marland Kitchen FLUoxetine (PROZAC) 20 MG capsule Take 20 mg by mouth 2 (two) times daily.   Marland Kitchen ibuprofen (ADVIL,MOTRIN) 600 MG tablet Take 1 tablet (600 mg total) by mouth every 8 (eight) hours as needed.  Marland Kitchen ibuprofen (RA IBUPROFEN) 200 MG tablet take 1 tablet by mouth every 6 hours if needed  . lisinopril (PRINIVIL,ZESTRIL) 2.5 MG tablet Take 1 tablet (2.5 mg total) by mouth daily.  . Multiple Vitamins-Minerals (RA ONE DAILY MAXIMUM) TABS Take 1 tablet by mouth daily.  . mupirocin ointment (BACTROBAN) 2 % Apply twice daily to scabbed area  . OLANZapine (ZYPREXA) 20 MG tablet Take 40 mg by mouth at bedtime.   No facility-administered encounter medications on file as of 03/19/2018.     Activities of Daily Living In your present state of health, do you have any difficulty performing the following activities: 03/19/2018  Hearing? N  Vision? N  Difficulty concentrating or making decisions? N  Walking or climbing stairs? N  Dressing or bathing? N  Doing errands, shopping? N  Preparing Food and eating ? N  Using the Toilet? N  In the past six months, have you accidently leaked urine? N  Do you have problems with  loss of bowel control? N  Managing your Medications? N  Managing your Finances? N  Housekeeping or managing your Housekeeping? N  Some recent data might be hidden    Patient Care Team: Mosie Lukes, MD as  PCP - General (Family Medicine) Jacelyn Pi, MD as Consulting Physician (Endocrinology) Noemi Chapel, NP as Nurse Practitioner   Assessment:   This is a routine wellness examination for [facility]. Physical assessment deferred to PCP.  Exercise Activities and Dietary recommendations Current Exercise Habits: Home exercise routine, Type of exercise: walking;strength training/weights, Time (Minutes): 60, Frequency (Times/Week): 5, Weekly Exercise (Minutes/Week): 300, Intensity: Mild, Exercise limited by: None identified   Diet (meal preparation, eat out, water intake, caffeinated beverages, dairy products, fruits and vegetables): well balanced Reports drinking a lot of water.     Goals    . Maintain healthy diet and continue exercising.       Fall Risk Fall Risk  03/19/2018 06/22/2015  Falls in the past year? No No  Risk for fall due to : - Other (Comment)   Depression Screen PHQ 2/9 Scores 03/19/2018 07/02/2017 06/22/2015  PHQ - 2 Score 0 0 0  Exception Documentation - - Other- indicate reason in comment box    Cognitive Function        Immunization History  Administered Date(s) Administered  . Influenza Split 06/15/2017  . Influenza Whole 06/01/2013  . Influenza,inj,Quad PF,6+ Mos 06/09/2014  . Influenza-Unspecified 06/11/2012, 05/11/2015, 06/07/2016  . PPD Test 01/04/2012  . Pneumococcal Polysaccharide-23 11/04/2011    Screening Tests Health Maintenance  Topic Date Due  . PNEUMOCOCCAL POLYSACCHARIDE VACCINE (2) 11/03/2016  . FOOT EXAM  03/07/2017  . HEMOGLOBIN A1C  07/05/2017  . INFLUENZA VACCINE  04/25/2018  . OPHTHALMOLOGY EXAM  10/05/2018  . TETANUS/TDAP  11/02/2020  . HIV Screening  Completed      Plan:     Please schedule your next medicare  wellness visit with me in 1 yr.  Continue to eat heart healthy diet (full of fruits, vegetables, whole grains, lean protein, water--limit salt, fat, and sugar intake) and increase physical activity as tolerated.  Continue doing brain stimulating activities (puzzles, reading, adult coloring books, staying active) to keep memory sharp.     I have personally reviewed and noted the following in the patient's chart:   . Medical and social history . Use of alcohol, tobacco or illicit drugs  . Current medications and supplements . Functional ability and status . Nutritional status . Physical activity . Advanced directives . List of other physicians . Hospitalizations, surgeries, and ER visits in previous 12 months . Vitals . Screenings to include cognitive, depression, and falls . Referrals and appointments  In addition, I have reviewed and discussed with patient certain preventive protocols, quality metrics, and best practice recommendations. A written personalized care plan for preventive services as well as general preventive health recommendations were provided to patient.     Shela Nevin, [place]  03/19/2018   Medical screening examination/treatment was performed by qualified clinical staff member and as supervising physician I was immediately available for consultation/collaboration. I have reviewed documentation and agree with assessment and plan.  Penni Homans, MD

## 2018-03-19 ENCOUNTER — Ambulatory Visit (INDEPENDENT_AMBULATORY_CARE_PROVIDER_SITE_OTHER): Payer: Medicare Other | Admitting: *Deleted

## 2018-03-19 ENCOUNTER — Encounter: Payer: Self-pay | Admitting: *Deleted

## 2018-03-19 VITALS — BP 120/68 | HR 67 | Ht 72.0 in | Wt 264.0 lb

## 2018-03-19 DIAGNOSIS — Z0000 Encounter for general adult medical examination without abnormal findings: Secondary | ICD-10-CM | POA: Diagnosis not present

## 2018-03-19 NOTE — Patient Instructions (Signed)
Please schedule your next medicare wellness visit with me in 1 yr.  Continue to eat heart healthy diet (full of fruits, vegetables, whole grains, lean protein, water--limit salt, fat, and sugar intake) and increase physical activity as tolerated.  Continue doing brain stimulating activities (puzzles, reading, adult coloring books, staying active) to keep memory sharp.   Joseph [name] , Thank you for taking time to come for your Medicare Wellness Visit. I appreciate your ongoing commitment to your health goals. Please review the following plan we discussed and let me know if I can assist you in the future.   These are the goals we discussed: Goals    . Maintain healthy diet and continue exercising.       This is a list of the screening recommended for you and due dates:  Health Maintenance  Topic Date Due  . Pneumococcal vaccine (2) 11/03/2016  . Complete foot exam   03/07/2017  . Hemoglobin A1C  07/05/2017  . Flu Shot  04/25/2018  . Eye exam for diabetics  10/05/2018  . Tetanus Vaccine  11/02/2020  . HIV Screening  Completed    Health Maintenance, Male A healthy lifestyle and preventive care is important for your health and wellness. Ask your health care provider about what schedule of regular examinations is right for you. What should I know about weight and diet? Eat a Healthy Diet  Eat plenty of vegetables, fruits, whole grains, low-fat dairy products, and lean protein.  Do not eat a lot of foods high in solid fats, added sugars, or salt.  Maintain a Healthy Weight Regular exercise can help you achieve or maintain a healthy weight. You should:  Do at least 150 minutes of exercise each week. The exercise should increase your heart rate and make you sweat (moderate-intensity exercise).  Do strength-training exercises at least twice a week.  Watch Your Levels of Cholesterol and Blood Lipids  Have your blood tested for lipids and cholesterol every 5 years starting at 44 years  of age. If you are at high risk for heart disease, you should start having your blood tested when you are 44 years old. You may need to have your cholesterol levels checked more often if: ? Your lipid or cholesterol levels are high. ? You are older than 44 years of age. ? You are at high risk for heart disease.  What should I know about cancer screening? Many types of cancers can be detected early and may often be prevented. Lung Cancer  You should be screened every year for lung cancer if: ? You are a current smoker who has smoked for at least 30 years. ? You are a former smoker who has quit within the past 15 years.  Talk to your health care provider about your screening options, when you should start screening, and how often you should be screened.  Colorectal Cancer  Routine colorectal cancer screening usually begins at 44 years of age and should be repeated every 5-10 years until you are 44 years old. You may need to be screened more often if early forms of precancerous polyps or small growths are found. Your health care provider may recommend screening at an earlier age if you have risk factors for colon cancer.  Your health care provider may recommend using home test kits to check for hidden blood in the stool.  A small camera at the end of a tube can be used to examine your colon (sigmoidoscopy or colonoscopy). This checks for the  earliest forms of colorectal cancer.  Prostate and Testicular Cancer  Depending on your age and overall health, your health care provider may do certain tests to screen for prostate and testicular cancer.  Talk to your health care provider about any symptoms or concerns you have about testicular or prostate cancer.  Skin Cancer  Check your skin from head to toe regularly.  Tell your health care provider about any new moles or changes in moles, especially if: ? There is a change in a mole's size, shape, or color. ? You have a mole that is larger  than a pencil eraser.  Always use sunscreen. Apply sunscreen liberally and repeat throughout the day.  Protect yourself by wearing long sleeves, pants, a wide-brimmed hat, and sunglasses when outside.  What should I know about heart disease, diabetes, and high blood pressure?  If you are 29-64 years of age, have your blood pressure checked every 3-5 years. If you are 34 years of age or older, have your blood pressure checked every year. You should have your blood pressure measured twice-once when you are at a hospital or clinic, and once when you are not at a hospital or clinic. Record the average of the two measurements. To check your blood pressure when you are not at a hospital or clinic, you can use: ? An automated blood pressure machine at a pharmacy. ? A home blood pressure monitor.  Talk to your health care provider about your target blood pressure.  If you are between 64-17 years old, ask your health care provider if you should take aspirin to prevent heart disease.  Have regular diabetes screenings by checking your fasting blood sugar level. ? If you are at a normal weight and have a low risk for diabetes, have this test once every three years after the age of 65. ? If you are overweight and have a high risk for diabetes, consider being tested at a younger age or more often.  A one-time screening for abdominal aortic aneurysm (AAA) by ultrasound is recommended for men aged 54-75 years who are current or former smokers. What should I know about preventing infection? Hepatitis B If you have a higher risk for hepatitis B, you should be screened for this virus. Talk with your health care provider to find out if you are at risk for hepatitis B infection. Hepatitis C Blood testing is recommended for:  Everyone born from [birth year] through 1967.  Anyone with known risk factors for hepatitis C.  Sexually Transmitted Diseases (STDs)  You should be screened each year for STDs including  gonorrhea and chlamydia if: ? You are sexually active and are younger than 44 years of age. ? You are older than 44 years of age and your health care provider tells you that you are at risk for this type of infection. ? Your sexual activity has changed since you were last screened and you are at an increased risk for chlamydia or gonorrhea. Ask your health care provider if you are at risk.  Talk with your health care provider about whether you are at high risk of being infected with HIV. Your health care provider may recommend a prescription medicine to help prevent HIV infection.  What else can I do?  Schedule regular health, dental, and eye exams.  Stay current with your vaccines (immunizations).  Do not use any tobacco products, such as cigarettes, chewing tobacco, and e-cigarettes. If you need help quitting, ask your health care provider.  Limit alcohol  intake to no more than 2 drinks per day. One drink equals 12 ounces of beer, 5 ounces of wine, or 1 ounces of hard liquor.  Do not use street drugs.  Do not share needles.  Ask your health care provider for help if you need support or information about quitting drugs.  Tell your health care provider if you often feel depressed.  Tell your health care provider if you have ever been abused or do not feel safe at home. This information is not intended to replace advice given to you by your health care provider. Make sure you discuss any questions you have with your health care provider. Document Released: 03/09/2008 Document Revised: 05/10/2016 Document Reviewed: 06/15/2015 Elsevier Interactive Patient Education  [employer].

## 2018-03-21 ENCOUNTER — Ambulatory Visit (HOSPITAL_BASED_OUTPATIENT_CLINIC_OR_DEPARTMENT_OTHER)
Admission: RE | Admit: 2018-03-21 | Discharge: 2018-03-21 | Disposition: A | Discharge status: Home / Self Care | Payer: Medicare Other | Source: Ambulatory Visit | Attending: Family | Admitting: Family

## 2018-03-21 ENCOUNTER — Encounter: Payer: Self-pay | Admitting: Family

## 2018-03-21 ENCOUNTER — Ambulatory Visit (INDEPENDENT_AMBULATORY_CARE_PROVIDER_SITE_OTHER): Payer: Medicare Other | Admitting: Family

## 2018-03-21 VITALS — BP 107/65 | HR 81 | Temp 98.6°F | Resp 16 | Ht 74.0 in | Wt 266.0 lb

## 2018-03-21 DIAGNOSIS — Z0000 Encounter for general adult medical examination without abnormal findings: Secondary | ICD-10-CM

## 2018-03-21 DIAGNOSIS — R7611 Nonspecific reaction to tuberculin skin test without active tuberculosis: Secondary | ICD-10-CM

## 2018-03-21 DIAGNOSIS — Z23 Encounter for immunization: Secondary | ICD-10-CM

## 2018-03-21 DIAGNOSIS — E119 Type 2 diabetes mellitus without complications: Secondary | ICD-10-CM | POA: Diagnosis not present

## 2018-03-21 DIAGNOSIS — H6123 Impacted cerumen, bilateral: Secondary | ICD-10-CM | POA: Diagnosis not present

## 2018-03-21 MED ORDER — DOCUSATE SODIUM 100 MG PO CAPS
100.0000 mg | ORAL_CAPSULE | Freq: Two times a day (BID) | ORAL | 11 refills | Status: DC
Start: 2018-03-21 — End: 2019-06-24

## 2018-03-21 MED ORDER — IBUPROFEN 600 MG PO TABS: 600.0000 mg | ORAL_TABLET | Freq: Three times a day (TID) | ORAL | 2 refills | Status: DC | PRN

## 2018-03-21 MED ORDER — LISINOPRIL 2.5 MG PO TABS: 2.5000 mg | ORAL_TABLET | Freq: Every day | ORAL | 11 refills | Status: DC

## 2018-03-21 MED ORDER — RA ONE DAILY MAXIMUM PO TABS: 1.0000 | ORAL_TABLET | Freq: Every day | ORAL | 3 refills | Status: DC

## 2018-03-21 MED ORDER — LORATADINE 10 MG PO TABS: 10.0000 mg | ORAL_TABLET | Freq: Every day | ORAL | 11 refills | Status: DC

## 2018-03-21 MED ORDER — ASPIRIN 81 MG PO TBEC: 81.0000 mg | DELAYED_RELEASE_TABLET | Freq: Every day | ORAL | 11 refills | Status: DC

## 2018-03-21 MED ORDER — LISINOPRIL 2.5 MG PO TABS
2.5000 mg | ORAL_TABLET | Freq: Every day | ORAL | 11 refills | Status: DC
Start: 2018-03-21 — End: 2018-03-21

## 2018-03-21 NOTE — Patient Instructions (Signed)
Complete chest x-ray on the first floor. Complete lab work prior to leaving.

## 2018-03-21 NOTE — Progress Notes (Signed)
Subjective:    Joseph [name] is a 42 y.o. male who presents for Medicare Annual/Subsequent preventive examination.   Preventive Screening-Counseling & Management  Tobacco Social History   Tobacco Use  Smoking Status Former Smoker  . Packs/day: 1.00  . Years: 1.00  . Pack years: 1.00  . Types: Cigarettes  . Start date: 07/15/2012  . Last attempt to quit: 06/13/2013  . Years since quitting: 4.7  Smokeless Tobacco Current User  . Types: Chew    Problems Prior to Visit 1. Schizophrenia- followed by psychiatry and notes that symptoms are well controlled. Sees Noemi Chapel,      Current Problems (verified) Patient Active Problem List   Diagnosis Date Noted  . Medicare annual wellness visit, subsequent 03/19/2016  . Morbid obesity ([place]) 06/14/2015  . Enlarged thyroid 03/07/2015  . 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 Cerumen impaction 07/13/2013  . Foot drop, bilateral 12/03/2012  . Elevated BP 10/20/2012  . 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  . Abnormal liver enzymes 01/14/2012  . Hyperlipidemia, mild 12/01/2011  . OSA (obstructive sleep apnea) 12/01/2011  . H/O tobacco use, presenting hazards to health 12/01/2011  . 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 Pneumonia 11/04/2011  . Schizoaffective disorder ([place]) 11/04/2011  . Diabetes mellitus ([place]) 11/04/2011  . Iron deficiency anemia 08/15/2011  . Lupus anticoagulant positive 08/15/2011    Medications Prior to Visit Current Outpatient Medications on File Prior to Visit  Medication Sig Dispense Refill  . aspirin (RA ASPIRIN ADULT LOW STRENGTH) 81 MG EC tablet Take 1 tablet (81 mg total) by mouth daily. Swallow whole. 30 tablet 2  . clonazePAM (KLONOPIN) 1 MG tablet Take 1 mg by mouth 2 (two) times daily. And As needed For anxiety    . divalproex (DEPAKOTE) 500 MG EC tablet Take 1,000 mg by mouth 2 (two) times daily. 1 in a.m., 2 at bedtime    . docusate sodium (RA COL-RITE) 100 MG capsule Take 1 capsule (100 mg  total) by mouth 2 (two) times daily. 60 capsule 2  . FLUoxetine (PROZAC) 20 MG capsule Take 20 mg by mouth 2 (two) times daily.   0  . ibuprofen (ADVIL,MOTRIN) 600 MG tablet Take 1 tablet (600 mg total) by mouth every 8 (eight) hours as needed. 30 tablet 0  . ibuprofen (RA IBUPROFEN) 200 MG tablet take 1 tablet by mouth every 6 hours if needed 100 tablet 6  . lisinopril (PRINIVIL,ZESTRIL) 2.5 MG tablet Take 1 tablet (2.5 mg total) by mouth daily. 30 tablet 11  . Multiple Vitamins-Minerals (RA ONE DAILY MAXIMUM) TABS Take 1 tablet by mouth daily. 100 tablet 3  . OLANZapine (ZYPREXA) 20 MG tablet Take 40 mg by mouth at bedtime.    . mupirocin ointment (BACTROBAN) 2 % Apply twice daily to scabbed area (Patient not taking: Reported on 03/21/2018) 22 g 0   No current facility-administered medications on file prior to visit.     Current Medications (verified) Current Outpatient Medications  Medication Sig Dispense Refill  . aspirin (RA ASPIRIN ADULT LOW STRENGTH) 81 MG EC tablet Take 1 tablet (81 mg total) by mouth daily. Swallow whole. 30 tablet 2  . clonazePAM (KLONOPIN) 1 MG tablet Take 1 mg by mouth 2 (two) times daily. And As needed For anxiety    . divalproex (DEPAKOTE) 500 MG EC tablet Take 1,000 mg by mouth 2 (two) times daily. 1 in a.m., 2 at bedtime    . docusate sodium (RA COL-RITE) 100 MG capsule Take  1 capsule (100 mg total) by mouth 2 (two) times daily. 60 capsule 2  . FLUoxetine (PROZAC) 20 MG capsule Take 20 mg by mouth 2 (two) times daily.   0  . ibuprofen (ADVIL,MOTRIN) 600 MG tablet Take 1 tablet (600 mg total) by mouth every 8 (eight) hours as needed. 30 tablet 0  . ibuprofen (RA IBUPROFEN) 200 MG tablet take 1 tablet by mouth every 6 hours if needed 100 tablet 6  . lisinopril (PRINIVIL,ZESTRIL) 2.5 MG tablet Take 1 tablet (2.5 mg total) by mouth daily. 30 tablet 11  . Multiple Vitamins-Minerals (RA ONE DAILY MAXIMUM) TABS Take 1 tablet by mouth daily. 100 tablet 3  . OLANZapine  (ZYPREXA) 20 MG tablet Take 40 mg by mouth at bedtime.    . mupirocin ointment (BACTROBAN) 2 % Apply twice daily to scabbed area (Patient not taking: Reported on 03/21/2018) 22 g 0   No current facility-administered medications for this visit.      Allergies (verified) Haldol [haloperidol decanoate] and Statins   PAST HISTORY  Family History Family History  Problem Relation Age of Onset  . GER disease Father   . Diverticulosis Father   . Colon cancer Neg Hx     Social History Social History   Tobacco Use  . Smoking status: Former Smoker    Packs/day: 1.00    Years: 1.00    Pack years: 1.00    Types: Cigarettes    Start date: 07/15/2012    Last attempt to quit: 06/13/2013    Years since quitting: 4.7  . Smokeless tobacco: Current User    Types: Chew  Substance Use Topics  . Alcohol use: No    Alcohol/week: 16.8 oz    Types: 28 Cans of beer per week    Comment: pt claims he drinks 4, 40 oz cans of malt liquor every day    Are there smokers in your home (other than you)?  No  Risk Factors Current exercise habits: Home exercise routine includes walks regularly.  Dietary issues discussed: reports healthy diet   Cardiac risk factors: male gender.  Depression Screen (Note: if answer to either of the following is "Yes", a more complete depression screening is indicated)   Q1: Over the past two weeks, have you felt down, depressed or hopeless? No  Q2: Over the past two weeks, have you felt little interest or pleasure in doing things? No  Have you lost interest or pleasure in daily life? No  Do you often feel hopeless? No  Do you cry easily over simple problems? No  Activities of Daily Living In your present state of health, do you have any difficulty performing the following activities?:  Driving? No Managing money?  Has help managing his money Feeding yourself? No Getting from bed to chair? No Climbing a flight of stairs? No Preparing food and eating?: No Bathing  or showering? No Getting dressed: No Getting to the toilet? No Using the toilet:No Moving around from place to place: No In the past year have you fallen or had a near fall?:No   Are you sexually active?  No  Do you have more than one partner?  N/A  Hearing Difficulties: No Do you often ask people to speak up or repeat themselves? No Do you experience ringing or noises in your ears? No Do you have difficulty understanding soft or whispered voices? No   Do you feel that you have a problem with memory? No  Do you often misplace items?  No  Do you feel safe at home?  Yes  Cognitive Testing  Alert? Yes  Normal Appearance?Yes  Oriented to person? Yes  Place? Yes   Time? Yes  Recall of three objects?  Yes  Can perform simple calculations? Yes  Displays appropriate judgment?Yes  Can read the correct time from a watch face?Yes   Advanced Directives have been discussed with the patient? Yes   List the Names of Other Physician/Practitioners you currently use: 1.  Rutherford Guys, ophthalmology 2. Kara Mead, Pulmonology 3. Lilia Argue, Sports medicine 4.  [facility]  Indicate any recent Medical Services you may have received from other than [facility] providers in the past year (date may be approximate).  Immunization History  Administered Date(s) Administered  . Influenza Split 06/15/2017  . Influenza Whole 06/01/2013  . Influenza,inj,Quad PF,6+ Mos 06/09/2014  . Influenza-Unspecified 06/11/2012, 05/11/2015, 06/07/2016  . PPD Test 01/04/2012  . Pneumococcal Polysaccharide-23 11/04/2011    Screening Tests Health Maintenance  Topic Date Due  . PNEUMOCOCCAL POLYSACCHARIDE VACCINE (2) 11/03/2016  . FOOT EXAM  03/07/2017  . HEMOGLOBIN A1C  07/05/2017  . INFLUENZA VACCINE  04/25/2018  . OPHTHALMOLOGY EXAM  10/05/2018  . TETANUS/TDAP  11/02/2020  . HIV Screening  Completed    All answers were reviewed with the patient and necessary referrals were made:  Nance Pear, NP   03/21/2018   History reviewed: allergies, current medications, past family history, past medical history, past social history, past surgical history and problem list  Review of Systems Pertinent items are noted in HPI.    Objective:   Blood pressure 107/65, pulse 81, temperature 98.6 F (37 C), temperature source Oral, resp. rate 16, height 6\' 2"  (1.88 m), weight 266 lb (120.7 kg), SpO2 97 %. Body mass index is 34.15 kg/m.  Physical Exam  Constitutional: He is oriented to person, place, and time. He appears well-developed and well-nourished. No distress.  HENT:  Head: Normocephalic and atraumatic.  Ears: bilateral cerumen impaction Mouth/Throat: Oropharynx is clear and moist.  Eyes: Pupils are equal, round, and reactive to light. No scleral icterus.  Neck: Normal range of motion. No thyromegaly present.  Cardiovascular: Normal rate and regular rhythm.   No murmur heard. Pulmonary/Chest: Effort normal and breath sounds normal. No respiratory distress. He has no wheezes. He has no rales. He exhibits no tenderness.  Abdominal: Soft. Bowel sounds are normal. He exhibits no distension and no mass. There is no tenderness. There is no rebound and no guarding.  Musculoskeletal: He exhibits 2+ bilateral LE edeam Lymphadenopathy:    He has no cervical adenopathy.  Neurological: He is alert and oriented to person, place, and time. He has normal patellar reflexes. He exhibits normal muscle tone. Coordination normal.  Skin: Skin is warm and dry.  Psychiatric: He has a flat affect Judgment and thought content normal.           Assessment & Plan:   Cerumen impaction- removed by cma with syringing- pt tolerated procedure well.  DM2-diet controlled.  Obtain A1C.,  Preventative care- Tdap today.   Hx PPD positive- due for annual chest x-ray for his group home.        Assessment:     Plan:     During the course of the visit the patient was educated and counseled  about appropriate screening and preventive services including:    Td vaccine  Advanced directives: pt given paperwork, does not have advanced directive currently  Diet review for nutrition  referral? Yes ____  Not Indicated _x___   Patient Instructions (the written plan) was given to the patient.  Medicare Attestation I have personally reviewed: The patient's medical and social history Their use of alcohol, tobacco or illicit drugs Their current medications and supplements The patient's functional ability including ADLs,fall risks, home safety risks, cognitive, and hearing and visual impairment Diet and physical activities Evidence for depression or mood disorders  The patient's weight, height, BMI, and visual acuity have been recorded in the chart.  I have made referrals, counseling, and provided education to the patient based on review of the above and I have provided the patient with a written personalized care plan for preventive services.     Nance Pear, NP   03/21/2018      Patient ID: Joseph [name], male   DOB: 02/13/1976, 42 y.o.   MRN: [id number]

## 2018-03-22 LAB — LIPID PANEL
Cholesterol: 173 mg/dL (ref 0–200)
HDL: 49 mg/dL (ref >39.00)
LDL Cholesterol: 93 mg/dL (ref 0–99)
NonHDL: 123.53
Total CHOL/HDL Ratio: 4
Triglycerides: 153 mg/dL — ABNORMAL HIGH (ref 0.0–149.0)
VLDL: 30.6 mg/dL (ref 0.0–40.0)

## 2018-03-22 LAB — COMPREHENSIVE METABOLIC PANEL
ALT: 38 U/L (ref 0–53)
AST: 28 U/L (ref 0–37)
Albumin: 4.4 g/dL (ref 3.5–5.2)
Alkaline Phosphatase: 47 U/L (ref 39–117)
BUN: 10 mg/dL (ref 6–23)
CO2: 28 mEq/L (ref 19–32)
Calcium: 9.2 mg/dL (ref 8.4–10.5)
Chloride: 100 mEq/L (ref 96–112)
Creatinine, Ser: 0.93 mg/dL (ref 0.40–1.50)
GFR: 93.75 mL/min (ref >60.00)
Glucose, Bld: 95 mg/dL (ref 70–99)
Potassium: 4.1 mEq/L (ref 3.5–5.1)
Sodium: 136 mEq/L (ref 135–145)
Total Bilirubin: 0.4 mg/dL (ref 0.2–1.2)
Total Protein: 6.7 g/dL (ref 6.0–8.3)

## 2018-03-22 LAB — HEMOGLOBIN A1C: Hgb A1c MFr Bld: 5.9 % (ref 4.6–6.5)

## 2018-05-17 DIAGNOSIS — Z23 Encounter for immunization: Secondary | ICD-10-CM | POA: Diagnosis not present

## 2018-07-01 ENCOUNTER — Ambulatory Visit: Payer: Medicare Other | Admitting: Adult Health

## 2018-07-08 ENCOUNTER — Ambulatory Visit (INDEPENDENT_AMBULATORY_CARE_PROVIDER_SITE_OTHER): Payer: Medicare Other | Admitting: Adult Health

## 2018-07-08 ENCOUNTER — Encounter: Payer: Self-pay | Admitting: Adult Health

## 2018-07-08 DIAGNOSIS — G4733 Obstructive sleep apnea (adult) (pediatric): Secondary | ICD-10-CM | POA: Diagnosis not present

## 2018-07-08 NOTE — Addendum Note (Signed)
Addended by: Vivia Ewing on: 07/08/2018 02:13 PM   Modules accepted: Orders

## 2018-07-08 NOTE — Progress Notes (Signed)
@Patient  ID: Joseph [name], male    DOB: 06-05-1976, 42 y.o.   MRN: [id number]  Chief Complaint  Patient presents with  . Follow-up    OSA     Referring provider: Mosie Lukes, MD  HPI: 42 year old male followed for severe sleep apnea on CPAP Patient lives in a group home  Significant tests/ events reviewed  PSG 08/2006 - 240 pounds- AHI of 10 events per hour with mild oxygen desaturation to lowest of 87%. This was corrected by CPAP of 6 cm with a full face mask.   PSG 08/2013 showed AHI 62/h, predom hypopneas corrected by 12 cm  07/08/2018 Follow up : OSA  Patient presents for a follow-up for sleep apnea.  Patient says he is doing well on CPAP.  Feels rested with no significant daytime sleepiness.  Says he wears it every single night.  Download shows excellent compliance with average usage at 8 hours.  Patient is on CPAP 11 cm H2O.  AHI is 1.5. Patient is accompanied by group home caregiver.    Allergies  Allergen Reactions  . Haldol [Haloperidol Decanoate]     Painful   . Statins     ? Related to compartment syndrome    Immunization History  Administered Date(s) Administered  . Influenza Split 06/15/2017  . Influenza Whole 06/01/2013  . Influenza,inj,Quad PF,6+ Mos 06/09/2014, 06/15/2017, 05/17/2018  . Influenza-Unspecified 06/11/2012, 05/11/2015, 06/07/2016  . PPD Test 01/04/2012  . Pneumococcal Polysaccharide-23 11/04/2011  . Tdap 03/21/2018    Past Medical History:  Diagnosis Date  . Anemia    post operative  . Antiphospholipid syndrome ([place]) 08/15/2011  . Anxiety   . Cellulitis    bilateral thighs  . Cerumen impaction 07/13/2013  . 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 Elevated BP 10/20/2012  . Enlarged thyroid 03/07/2015  . H/O tobacco use, presenting hazards to health 12/01/2011   Quit October 2014   . Hyperlipidemia   . Lupus anticoagulant positive 08/15/2011  . Medicare annual wellness visit, subsequent 03/19/2016  . Obesity   . Schizophrenia  ([place])     Tobacco History: Social History   Tobacco Use  Smoking Status Former Smoker  . Packs/day: 1.00  . Years: 1.00  . Pack years: 1.00  . Types: Cigarettes  . Start date: 07/15/2012  . Last attempt to quit: 06/13/2013  . Years since quitting: 5.0  Smokeless Tobacco Current User  . Types: Chew   Ready to quit: Not Answered Counseling given: Not Answered   Outpatient Medications Prior to Visit  Medication Sig Dispense Refill  . aspirin (RA ASPIRIN ADULT LOW STRENGTH) 81 MG EC tablet Take 1 tablet (81 mg total) by mouth daily. Swallow whole. 30 tablet 11  . clonazePAM (KLONOPIN) 1 MG tablet Take 1 mg by mouth 2 (two) times daily. And As needed For anxiety    . divalproex (DEPAKOTE) 500 MG EC tablet Take 1,000 mg by mouth 2 (two) times daily. 1 in a.m., 2 at bedtime    . docusate sodium (RA COL-RITE) 100 MG capsule Take 1 capsule (100 mg total) by mouth 2 (two) times daily. 60 capsule 11  . FLUoxetine (PROZAC) 20 MG capsule Take 20 mg by mouth 2 (two) times daily.   0  . ibuprofen (RA IBUPROFEN) 200 MG tablet take 1 tablet by mouth every 6 hours if needed 100 tablet 6  . lisinopril (PRINIVIL,ZESTRIL) 2.5 MG tablet Take 1 tablet (2.5 mg total) by mouth daily. 30 tablet 11  .  loratadine (CLARITIN) 10 MG tablet Take 1 tablet (10 mg total) by mouth daily. 30 tablet 11  . Multiple Vitamins-Minerals (RA ONE DAILY MAXIMUM) TABS Take 1 tablet by mouth daily. 100 tablet 3  . mupirocin ointment (BACTROBAN) 2 % Apply twice daily to scabbed area 22 g 0  . OLANZapine (ZYPREXA) 20 MG tablet Take 40 mg by mouth at bedtime.    Marland Kitchen ibuprofen (ADVIL,MOTRIN) 600 MG tablet Take 1 tablet (600 mg total) by mouth every 8 (eight) hours as needed. (Patient not taking: Reported on 07/08/2018) 30 tablet 2   No facility-administered medications prior to visit.      Review of Systems  Constitutional:   No  weight loss, night sweats,  Fevers, chills, fatigue, or  lassitude.  HEENT:   No headaches,   Difficulty swallowing,  Tooth/dental problems, or  Sore throat,                No sneezing, itching, ear ache, nasal congestion, post nasal drip,   CV:  No chest pain,  Orthopnea, PND, swelling in lower extremities, anasarca, dizziness, palpitations, syncope.   GI  No heartburn, indigestion, abdominal pain, nausea, vomiting, diarrhea, change in bowel habits, loss of appetite, bloody stools.   Resp: No shortness of breath with exertion or at rest.  No excess mucus, no productive cough,  No non-productive cough,  No coughing up of blood.  No change in color of mucus.  No wheezing.  No chest wall deformity  Skin: no rash or lesions.  GU: no dysuria, change in color of urine, no urgency or frequency.  No flank pain, no hematuria   MS:  No joint pain or swelling.  No decreased range of motion.  No back pain.    Physical Exam  BP 118/78   Pulse 68   Ht 6\' 1"  (1.854 m)   Wt 270 lb 12.8 oz (122.8 kg)   SpO2 97%   BMI 35.73 kg/m   GEN: A/Ox3; pleasant , NAD, obese    HEENT:  [place]/AT,  EACs-clear, TMs-wnl, NOSE-clear, THROAT-clear, no lesions, no postnasal drip or exudate noted. Class 3 MP airway   NECK:  Supple w/ fair ROM; no JVD; normal carotid impulses w/o bruits; no thyromegaly or nodules palpated; no lymphadenopathy.    RESP  Clear  P & A; w/o, wheezes/ rales/ or rhonchi. no accessory muscle use, no dullness to percussion  CARD:  RRR, no m/r/g, no peripheral edema, pulses intact, no cyanosis or clubbing.  GI:   Soft & nt; nml bowel sounds; no organomegaly or masses detected.   Musco: Warm bil, no deformities or joint swelling noted. LE braces   Neuro: alert, no focal deficits noted.    Skin: Warm, no lesions or rashes    Lab Results:  CBC   BNP No results found for: BNP  Imaging: No results found.   Assessment & Plan:   OSA (obstructive sleep apnea) Well controlled on CPAP   Plan  Patient Instructions  Keep up the good work Continue on CPAP at  bedtime Work on healthy weight Follow-up in 1 year with Dr. Elsworth Soho or Parrett NP and As needed        Morbid obesity Weight loss      Rexene Edison, NP 07/08/2018

## 2018-07-08 NOTE — Patient Instructions (Signed)
Keep up the good work Continue on CPAP at bedtime Work on healthy weight Follow-up in 1 year with Dr. Elsworth Soho or Briannia Laba NP and As needed

## 2018-07-08 NOTE — Assessment & Plan Note (Signed)
-   Weight loss 

## 2018-07-08 NOTE — Assessment & Plan Note (Signed)
Well controlled on CPAP   Plan  Patient Instructions  Keep up the good work Continue on CPAP at bedtime Work on healthy weight Follow-up in 1 year with Dr. Elsworth Soho or Deran Barro NP and As needed

## 2018-07-17 DIAGNOSIS — N3944 Nocturnal enuresis: Secondary | ICD-10-CM | POA: Diagnosis not present

## 2018-07-17 DIAGNOSIS — R351 Nocturia: Secondary | ICD-10-CM | POA: Diagnosis not present

## 2018-07-17 DIAGNOSIS — R3914 Feeling of incomplete bladder emptying: Secondary | ICD-10-CM | POA: Diagnosis not present

## 2018-07-29 DIAGNOSIS — F200 Paranoid schizophrenia: Secondary | ICD-10-CM | POA: Diagnosis not present

## 2018-07-31 DIAGNOSIS — N3944 Nocturnal enuresis: Secondary | ICD-10-CM | POA: Diagnosis not present

## 2018-07-31 DIAGNOSIS — R351 Nocturia: Secondary | ICD-10-CM | POA: Diagnosis not present

## 2018-08-28 ENCOUNTER — Telehealth: Payer: Self-pay | Admitting: Adult Health

## 2018-08-28 NOTE — Telephone Encounter (Signed)
lmtcb for pt. I do not see where anyone from our office has contacted patient.

## 2018-08-29 NOTE — Telephone Encounter (Signed)
Attempted to call pt but unable to reach him. Left message for pt to return call. 

## 2018-09-02 NOTE — Telephone Encounter (Signed)
Called and spoke with patient. He states he never called, that he thinks his dad might have called.  I asked if he needed anything and he said he was doing good.   Nothing further needed at this time.

## 2018-11-29 ENCOUNTER — Telehealth: Payer: Self-pay | Admitting: Pulmonary Disease

## 2018-11-29 NOTE — Telephone Encounter (Signed)
Called and spoke with patient, he stated that he had a voicemail from [place] stating to call back to speak with her or Dr. Elsworth Soho. [name] please advise, thank you.

## 2018-11-29 NOTE — Telephone Encounter (Signed)
I do not recall ever speaking with this patient.

## 2019-01-13 ENCOUNTER — Telehealth: Payer: Self-pay | Admitting: Adult Health

## 2019-01-13 NOTE — Telephone Encounter (Signed)
Pt states has been having issues with cpap for the past two nights. He says his pressure feels different. Current cpap pressure at 11 cm He has had this last mask a month or two. Compliance report has been printed for suggestions.

## 2019-01-13 NOTE — Telephone Encounter (Signed)
Nothing looks different on the settings. Please have his DME company check the machine to make sure it is actually at the right pressure.

## 2019-01-13 NOTE — Telephone Encounter (Signed)
Contacted patient regarding cpap pressure issue.  Advised him of our provider's (T. Nichols,NP) recommendation to contact DME company to check his machine for proper working order.  Patient acknowledged understanding and confirms [employer] is DME and he was given phone number to contact them for this concern.  Patient to call us back if this does not resolve the issue.  Nothing further needed today.

## 2019-01-20 DIAGNOSIS — F200 Paranoid schizophrenia: Secondary | ICD-10-CM | POA: Diagnosis not present

## 2019-03-19 ENCOUNTER — Other Ambulatory Visit: Payer: Self-pay | Admitting: Family

## 2019-03-24 ENCOUNTER — Ambulatory Visit: Payer: Medicare Other | Admitting: *Deleted

## 2019-03-24 ENCOUNTER — Encounter: Payer: Medicare Other | Admitting: Family Medicine

## 2019-03-31 DIAGNOSIS — E119 Type 2 diabetes mellitus without complications: Secondary | ICD-10-CM | POA: Diagnosis not present

## 2019-03-31 DIAGNOSIS — E7800 Pure hypercholesterolemia, unspecified: Secondary | ICD-10-CM | POA: Diagnosis not present

## 2019-04-07 DIAGNOSIS — E119 Type 2 diabetes mellitus without complications: Secondary | ICD-10-CM | POA: Diagnosis not present

## 2019-04-07 DIAGNOSIS — E7800 Pure hypercholesterolemia, unspecified: Secondary | ICD-10-CM | POA: Diagnosis not present

## 2019-05-14 ENCOUNTER — Other Ambulatory Visit: Payer: Self-pay | Admitting: Family Medicine

## 2019-06-20 ENCOUNTER — Telehealth: Payer: Self-pay | Admitting: Family Medicine

## 2019-06-23 NOTE — Telephone Encounter (Signed)
He has schizophrenia and lives in a group home. Would recommend we reach out to group home and set him up with a visit in next few months and can be virtual if they prefer just to keep his meds active. This is just over the counter Docusate for constipation but I am unaware of the strength he has been using can try 100 mg po daily and see if that works.

## 2019-06-23 NOTE — Telephone Encounter (Signed)
You have not seen this patient since 2018. He has canceled the last 4 appointments with you  Please advise about removing you as PCP

## 2019-06-24 NOTE — Telephone Encounter (Signed)
Drue Dun, Can you call to have patient set up a virtual visit, he has a diagnosis of schizophrenia so maybe the number will be to a facility

## 2019-06-24 NOTE — Telephone Encounter (Signed)
NO

## 2019-06-24 NOTE — Telephone Encounter (Signed)
Pt already has appt 11/10. Does he need sooner?

## 2019-06-28 ENCOUNTER — Other Ambulatory Visit: Payer: Self-pay | Admitting: Family Medicine

## 2019-07-02 ENCOUNTER — Other Ambulatory Visit: Payer: Self-pay | Admitting: Family Medicine

## 2019-07-07 ENCOUNTER — Ambulatory Visit (INDEPENDENT_AMBULATORY_CARE_PROVIDER_SITE_OTHER): Payer: Medicare Other | Admitting: Adult Health

## 2019-07-07 ENCOUNTER — Other Ambulatory Visit: Payer: Self-pay

## 2019-07-07 ENCOUNTER — Encounter: Payer: Self-pay | Admitting: Adult Health

## 2019-07-07 DIAGNOSIS — G4733 Obstructive sleep apnea (adult) (pediatric): Secondary | ICD-10-CM | POA: Diagnosis not present

## 2019-07-07 NOTE — Assessment & Plan Note (Signed)
Severe obstructive sleep apnea-needs improved CPAP compliance Patient education given  Plan  Patient Instructions  Continue on CPAP at bedtime Try to wear CPAP each night for at least 6 hours or more Work on healthy weight Follow-up in 1 year with Dr. Elsworth Soho or Parrett NP and As needed

## 2019-07-07 NOTE — Progress Notes (Signed)
@Patient  ID: Joseph [name], male    DOB: October 07, 1975, 43 y.o.   MRN: [id number]  Chief Complaint  Patient presents with  . Follow-up    OSA     Referring provider: Mosie Lukes, MD  HPI: 43 year old male followed for obstructive sleep apnea on nocturnal CPAP Patient lives in a group home  TEST/EVENTS :  PSG 08/2006 - 240 pounds- AHI of 10 events per hour with mild oxygen desaturation to lowest of 87%. This was corrected by CPAP of 6 cm with a full face mask.   PSG 08/2013 showed AHI 62/h, predom hypopneas corrected by 12 cm  07/07/2019 Follow up : OSA  Patient presents for a 1 year follow-up for obstructive sleep apnea.  Patient is on nocturnal CPAP. Says he wears each night , never misses a night . Did have a week last month that he did not wear it, mask split and had to wait for new mask to arrive. Says he feels rested. No significant daytime sleepiness.  Download shows 60% compliance with daily average usage at 8 hours.  AHI 1.1.  Patient is on CPAP 11 cm H2O.  Positive leaks.  Discussed diet and exercise.  Walks daily for 1 hr.     Allergies  Allergen Reactions  . Haldol [Haloperidol Decanoate]     Painful   . Statins     ? Related to compartment syndrome    Immunization History  Administered Date(s) Administered  . Influenza Split 06/15/2017, 06/07/2019  . Influenza Whole 06/01/2013  . Influenza,inj,Quad PF,6+ Mos 06/09/2014, 06/15/2017, 05/17/2018  . Influenza-Unspecified 06/11/2012, 05/11/2015, 06/07/2016  . PPD Test 01/04/2012  . Pneumococcal Polysaccharide-23 11/04/2011  . Tdap 03/21/2018    Past Medical History:  Diagnosis Date  . Anemia    post operative  . Antiphospholipid syndrome ([place]) 08/15/2011  . Anxiety   . Cellulitis    bilateral thighs  . Cerumen impaction 07/13/2013  . 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 Elevated BP 10/20/2012  . Enlarged thyroid 03/07/2015  . H/O tobacco use, presenting hazards to health 12/01/2011   Quit October 2014    . Hyperlipidemia   . Lupus anticoagulant positive 08/15/2011  . Medicare annual wellness visit, subsequent 03/19/2016  . Obesity   . Schizophrenia ([place])     Tobacco History: Social History   Tobacco Use  Smoking Status Former Smoker  . Packs/day: 1.00  . Years: 1.00  . Pack years: 1.00  . Types: Cigarettes  . Start date: 07/15/2012  . Quit date: 06/13/2013  . Years since quitting: 6.0  Smokeless Tobacco Current User  . Types: Chew   Ready to quit: Not Answered Counseling given: Not Answered   Outpatient Medications Prior to Visit  Medication Sig Dispense Refill  . ASPIRIN LOW DOSE 81 MG EC tablet TAKE 1 TABLET EVERY DAY 30 tablet 11  . clonazePAM (KLONOPIN) 1 MG tablet Take 1 mg by mouth 2 (two) times daily. And As needed For anxiety    . divalproex (DEPAKOTE) 500 MG EC tablet 1 in a.m., 2 at bedtime    . DOK 100 MG capsule TAKE 1 CAPSULE BY MOUTH TWICE DAILY 60 capsule 11  . FLUoxetine (PROZAC) 20 MG capsule Take 20 mg by mouth 2 (two) times daily.   0  . ibuprofen (ADVIL) 600 MG tablet TAKE 1 TABLET BY MOUTH EVERY 8 HOURS AS NEEDED 30 tablet 2  . ibuprofen (RA IBUPROFEN) 200 MG tablet take 1 tablet by mouth every 6 hours  if needed 100 tablet 6  . lisinopril (ZESTRIL) 2.5 MG tablet TAKE 1 TABLET BY MOUTH DAILY 30 tablet 11  . loratadine (CLARITIN) 10 MG tablet TAKE 1 TABLET BY MOUTH ONCE DAILY 30 tablet 11  . Multiple Vitamins-Minerals (ONE DAILY FOR MEN/LYCOPENE) TABS TAKE 1 TABLET BY MOUTH EVERY DAY 100 tablet 3  . OLANZapine (ZYPREXA) 20 MG tablet Take 40 mg by mouth at bedtime. May take an extra 20mg  as needed    . mupirocin ointment (BACTROBAN) 2 % Apply twice daily to scabbed area 22 g 0   No facility-administered medications prior to visit.      Review of Systems:   Constitutional:   No  weight loss, night sweats,  Fevers, chills, fatigue, or  lassitude.  HEENT:   No headaches,  Difficulty swallowing,  Tooth/dental problems, or  Sore throat,                 No sneezing, itching, ear ache, nasal congestion, post nasal drip,   CV:  No chest pain,  Orthopnea, PND, swelling in lower extremities, anasarca, dizziness, palpitations, syncope.   GI  No heartburn, indigestion, abdominal pain, nausea, vomiting, diarrhea, change in bowel habits, loss of appetite, bloody stools.   Resp: No shortness of breath with exertion or at rest.  No excess mucus, no productive cough,  No non-productive cough,  No coughing up of blood.  No change in color of mucus.  No wheezing.  No chest wall deformity  Skin: no rash or lesions.  GU: no dysuria, change in color of urine, no urgency or frequency.  No flank pain, no hematuria   MS:  No joint pain or swelling.  No decreased range of motion.  No back pain.    Physical Exam  BP 126/74 (BP Location: Left Arm, Cuff Size: Large)   Pulse 81   Temp (!) 97.2 F (36.2 C) (Temporal)   Ht 6\' 1"  (1.854 m)   Wt 246 lb 9.6 oz (111.9 kg)   SpO2 96%   BMI 32.53 kg/m   GEN: A/Ox3; pleasant , NAD, well nourished    HEENT:  [place]/AT,    NOSE-clear, THROAT-clear, no lesions, no postnasal drip or exudate noted.   NECK:  Supple w/ fair ROM; no JVD; normal carotid impulses w/o bruits; no thyromegaly or nodules palpated; no lymphadenopathy.    RESP  Clear  P & A; w/o, wheezes/ rales/ or rhonchi. no accessory muscle use, no dullness to percussion  CARD:  RRR, no m/r/g, no peripheral edema, pulses intact, no cyanosis or clubbing.  GI:   Soft & nt; nml bowel sounds; no organomegaly or masses detected.   Musco: Warm bil, no deformities or joint swelling noted.   Neuro: alert, no focal deficits noted.    Skin: Warm, no lesions or rashes    Lab Results:  CBC   BNP Imaging: No results found.    No flowsheet data found.  No results found for: NITRICOXIDE      Assessment & Plan:   OSA (obstructive sleep apnea) Severe obstructive sleep apnea-needs improved CPAP compliance Patient education given  Plan   Patient Instructions  Continue on CPAP at bedtime Try to wear CPAP each night for at least 6 hours or more Work on healthy weight Follow-up in 1 year with Dr. Elsworth Soho or Victorine Mcnee NP and As needed        Morbid obesity Healthy weight loss     Rexene Edison, NP 07/07/2019

## 2019-07-07 NOTE — Assessment & Plan Note (Signed)
Healthy weight loss 

## 2019-07-07 NOTE — Patient Instructions (Signed)
Continue on CPAP at bedtime Try to wear CPAP each night for at least 6 hours or more Work on healthy weight Follow-up in 1 year with Dr. Elsworth Soho or [place] NP and As needed

## 2019-07-28 ENCOUNTER — Ambulatory Visit: Payer: Medicare Other | Admitting: *Deleted

## 2019-07-28 ENCOUNTER — Encounter: Payer: Medicare Other | Admitting: Family Medicine

## 2019-08-01 ENCOUNTER — Other Ambulatory Visit: Payer: Self-pay

## 2019-08-04 NOTE — Progress Notes (Signed)
Subjective:   Joseph [name] is a 43 y.o. male who presents for Medicare Annual/Subsequent preventive examination.  Review of Systems:  Home Safety/Smoke Alarms: Feels safe in home. Smoke alarms in place.  Lives in group home with 9 other people.  Male:   CCS-  N/a PSA- No results found for: PSA      Objective:    Vitals: BP (!) 100/58 (BP Location: Left Arm, Patient Position: Sitting, Cuff Size: Normal)   Pulse 72   Temp (!) 97 F (36.1 C) (Temporal)   Ht 6\' 1"  (1.854 m)   Wt 269 lb 3.2 oz (122.1 kg)   SpO2 96%   BMI 35.52 kg/m   Body mass index is 35.52 kg/m.  Advanced Directives 08/05/2019 03/19/2018 03/19/2016 06/22/2015 06/22/2015 06/14/2015 11/03/2011  Does Patient Have a Medical Advance Directive? No No No No No No Patient does not have advance directive  Would patient like information on creating a medical advance directive? No - Patient declined Yes (MAU/Ambulatory/Procedural Areas - Information given) Yes - Educational materials given No - patient declined information No - patient declined information - -  Pre-existing out of facility DNR order (yellow form or pink MOST form) - - - - - - No    Tobacco Social History   Tobacco Use  Smoking Status Former Smoker  . Packs/day: 1.00  . Years: 1.00  . Pack years: 1.00  . Types: Cigarettes  . Start date: 07/15/2012  . Quit date: 06/13/2013  . Years since quitting: 6.1  Smokeless Tobacco Former Systems developer  . Types: Chew     Counseling given: Not Answered   Clinical Intake: Pain : No/denies pain     Past Medical History:  Diagnosis Date  . Anemia    post operative  . Antiphospholipid syndrome ([place]) 08/15/2011  . Anxiety   . Cellulitis    bilateral thighs  . Cerumen impaction 07/13/2013  . 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 Elevated BP 10/20/2012  . Enlarged thyroid 03/07/2015  . H/O tobacco use, presenting hazards to health 12/01/2011   Quit October 2014   . Hyperlipidemia   . Lupus anticoagulant positive  08/15/2011  . Medicare annual wellness visit, subsequent 03/19/2016  . Obesity   . Schizophrenia [facility])    Past Surgical History:  Procedure Laterality Date  . eye surgery     b/l laser eye sx 2004  . I&D EXTREMITY     left leg  . LEG SURGERY    . TONSILLECTOMY    . WISDOM TOOTH EXTRACTION     Family History  Problem Relation Age of Onset  . GER disease Father   . Diverticulosis Father   . Colon cancer Neg Hx    Social History   Socioeconomic History  . Marital status: Single    Spouse name: Not on file  . Number of children: 0  . Years of education: Not on file  . Highest education level: Not on file  Occupational History    Employer: [employer]    Comment: [employer]  Social Needs  . Financial resource strain: Not on file  . Food insecurity    Worry: Not on file    Inability: Not on file  . Transportation needs    Medical: Not on file    Non-medical: Not on file  Tobacco Use  . Smoking status: Former Smoker    Packs/day: 1.00    Years: 1.00    Pack years: 1.00  Types: Cigarettes    Start date: 07/15/2012    Quit date: 06/13/2013    Years since quitting: 6.1  . Smokeless tobacco: Former Systems developer    Types: Chew  Substance and Sexual Activity  . Alcohol use: No    Alcohol/week: 28.0 standard drinks    Types: 28 Cans of beer per week    Comment: pt claims he drinks 4, 40 oz cans of malt liquor every day  . Drug use: No  . Sexual activity: Not Currently  Lifestyle  . Physical activity    Days per week: Not on file    Minutes per session: Not on file  . Stress: Not on file  Relationships  . Social Herbalist on phone: Not on file    Gets together: Not on file    Attends religious service: Not on file    Active member of club or organization: Not on file    Attends meetings of clubs or organizations: Not on file    Relationship status: Not on file  Other Topics Concern  . Not on file  Social History Narrative  . Not on file     Outpatient Encounter Medications as of 08/05/2019  Medication Sig  . ASPIRIN LOW DOSE 81 MG EC tablet TAKE 1 TABLET EVERY DAY  . clonazePAM (KLONOPIN) 1 MG tablet Take 1 mg by mouth 2 (two) times daily. And As needed For anxiety  . divalproex (DEPAKOTE) 500 MG EC tablet 1 in a.m., 2 at bedtime  . DOK 100 MG capsule TAKE 1 CAPSULE BY MOUTH TWICE DAILY  . FLUoxetine (PROZAC) 20 MG capsule Take 20 mg by mouth 2 (two) times daily.   Marland Kitchen ibuprofen (ADVIL) 600 MG tablet TAKE 1 TABLET BY MOUTH EVERY 8 HOURS AS NEEDED  . ibuprofen (RA IBUPROFEN) 200 MG tablet take 1 tablet by mouth every 6 hours if needed  . lisinopril (ZESTRIL) 2.5 MG tablet TAKE 1 TABLET BY MOUTH DAILY  . loratadine (CLARITIN) 10 MG tablet TAKE 1 TABLET BY MOUTH ONCE DAILY  . Multiple Vitamins-Minerals (ONE DAILY FOR MEN/LYCOPENE) TABS TAKE 1 TABLET BY MOUTH EVERY DAY  . OLANZapine (ZYPREXA) 20 MG tablet Take 40 mg by mouth at bedtime. May take an extra 20mg  as needed   No facility-administered encounter medications on file as of 08/05/2019.     Activities of Daily Living In your present state of health, do you have any difficulty performing the following activities: 08/05/2019  Hearing? N  Vision? N  Difficulty concentrating or making decisions? N  Walking or climbing stairs? N  Dressing or bathing? N  Doing errands, shopping? N  Preparing Food and eating ? N  Using the Toilet? N  In the past six months, have you accidently leaked urine? N  Do you have problems with loss of bowel control? N  Managing your Medications? N  Managing your Finances? N  Housekeeping or managing your Housekeeping? N  Some recent data might be hidden    Patient Care Team: Mosie Lukes, MD as PCP - General (Family Medicine) Jacelyn Pi, MD as Consulting Physician (Endocrinology) Noemi Chapel, NP as Nurse Practitioner   Assessment:   This is a routine wellness examination for [facility]. Physical assessment deferred to PCP.  Exercise  Activities and Dietary recommendations Current Exercise Habits: Home exercise routine, Type of exercise: walking, Time (Minutes): 60, Frequency (Times/Week): 5, Weekly Exercise (Minutes/Week): 300, Intensity: Mild, Exercise limited by: None identified Diet (meal preparation, eat out, water  intake, caffeinated beverages, dairy products, fruits and vegetables): in general, a "healthy" diet  , well balanced   Goals    . Maintain healthy diet and continue exercising.       Fall Risk Fall Risk  08/05/2019 03/19/2018 06/22/2015  Falls in the past year? 0 No No  Number falls in past yr: 0 - -  Injury with Fall? 0 - -  Risk for fall due to : - - Other (Comment)    Depression Screen PHQ 2/9 Scores 08/05/2019 03/19/2018 07/02/2017 06/22/2015  PHQ - 2 Score 0 0 0 0  Exception Documentation - - - Other- indicate reason in comment box    Cognitive Function Ad8 score reviewed for issues:  Issues making decisions:no  Less interest in hobbies / activities:no  Repeats questions, stories (family complaining):no  Trouble using ordinary gadgets (microwave, computer, phone):no  Forgets the month or year: no  Mismanaging finances: no  Remembering appts:no  Daily problems with thinking and/or memory:no Ad8 score is=0   MMSE - Mini Mental State Exam 03/19/2018  Not completed: Refused        Immunization History  Administered Date(s) Administered  . Influenza Split 06/15/2017, 06/07/2019  . Influenza Whole 06/01/2013  . Influenza,inj,Quad PF,6+ Mos 06/09/2014, 06/15/2017, 05/17/2018  . Influenza-Unspecified 06/11/2012, 05/11/2015, 06/07/2016  . PPD Test 01/04/2012  . Pneumococcal Polysaccharide-23 11/04/2011  . Tdap 03/21/2018   Screening Tests Health Maintenance  Topic Date Due  . HEMOGLOBIN A1C  09/20/2018  . OPHTHALMOLOGY EXAM  10/05/2018  . FOOT EXAM  03/22/2019  . TETANUS/TDAP  03/21/2028  . INFLUENZA VACCINE  Completed  . PNEUMOCOCCAL POLYSACCHARIDE VACCINE AGE 22-64 HIGH  RISK  Completed  . HIV Screening  Completed       Plan:   See you next year!  Continue to eat heart healthy diet (full of fruits, vegetables, whole grains, lean protein, water--limit salt, fat, and sugar intake) and increase physical activity as tolerated.  Continue doing brain stimulating activities (puzzles, reading, adult coloring books, staying active) to keep memory sharp.   Bring a copy of your living will and/or healthcare power of attorney to your next office visit.   I have personally reviewed and noted the following in the patient's chart:   . Medical and social history . Use of alcohol, tobacco or illicit drugs  . Current medications and supplements . Functional ability and status . Nutritional status . Physical activity . Advanced directives . List of other physicians . Hospitalizations, surgeries, and ER visits in previous 12 months . Vitals . Screenings to include cognitive, depression, and falls . Referrals and appointments  In addition, I have reviewed and discussed with patient certain preventive protocols, quality metrics, and best practice recommendations. A written personalized care plan for preventive services as well as general preventive health recommendations were provided to patient.     Shela Nevin, [place]  08/05/2019

## 2019-08-05 ENCOUNTER — Encounter: Payer: Self-pay | Admitting: Family Medicine

## 2019-08-05 ENCOUNTER — Ambulatory Visit (INDEPENDENT_AMBULATORY_CARE_PROVIDER_SITE_OTHER): Payer: Medicare Other | Admitting: Family Medicine

## 2019-08-05 ENCOUNTER — Other Ambulatory Visit: Payer: Self-pay

## 2019-08-05 ENCOUNTER — Ambulatory Visit (HOSPITAL_BASED_OUTPATIENT_CLINIC_OR_DEPARTMENT_OTHER)
Admission: RE | Admit: 2019-08-05 | Discharge: 2019-08-05 | Disposition: A | Discharge status: Home / Self Care | Payer: Medicare Other | Source: Ambulatory Visit | Attending: Family Medicine | Admitting: Family Medicine

## 2019-08-05 ENCOUNTER — Ambulatory Visit (INDEPENDENT_AMBULATORY_CARE_PROVIDER_SITE_OTHER): Payer: Medicare Other | Admitting: *Deleted

## 2019-08-05 ENCOUNTER — Encounter: Payer: Self-pay | Admitting: *Deleted

## 2019-08-05 VITALS — BP 100/58 | HR 72 | Temp 97.0°F | Ht 73.0 in | Wt 269.2 lb

## 2019-08-05 DIAGNOSIS — R194 Change in bowel habit: Secondary | ICD-10-CM | POA: Insufficient documentation

## 2019-08-05 DIAGNOSIS — R748 Abnormal levels of other serum enzymes: Secondary | ICD-10-CM | POA: Diagnosis not present

## 2019-08-05 DIAGNOSIS — Z87891 Personal history of nicotine dependence: Secondary | ICD-10-CM

## 2019-08-05 DIAGNOSIS — E785 Hyperlipidemia, unspecified: Secondary | ICD-10-CM | POA: Diagnosis not present

## 2019-08-05 DIAGNOSIS — F259 Schizoaffective disorder, unspecified: Secondary | ICD-10-CM

## 2019-08-05 DIAGNOSIS — Z0000 Encounter for general adult medical examination without abnormal findings: Secondary | ICD-10-CM | POA: Diagnosis not present

## 2019-08-05 DIAGNOSIS — R7611 Nonspecific reaction to tuberculin skin test without active tuberculosis: Secondary | ICD-10-CM | POA: Insufficient documentation

## 2019-08-05 DIAGNOSIS — D509 Iron deficiency anemia, unspecified: Secondary | ICD-10-CM

## 2019-08-05 DIAGNOSIS — R3911 Hesitancy of micturition: Secondary | ICD-10-CM | POA: Diagnosis not present

## 2019-08-05 DIAGNOSIS — E119 Type 2 diabetes mellitus without complications: Secondary | ICD-10-CM

## 2019-08-05 DIAGNOSIS — E049 Nontoxic goiter, unspecified: Secondary | ICD-10-CM

## 2019-08-05 DIAGNOSIS — K921 Melena: Secondary | ICD-10-CM | POA: Insufficient documentation

## 2019-08-05 LAB — URINALYSIS
Bilirubin Urine: NEGATIVE
Hgb urine dipstick: NEGATIVE
Ketones, ur: NEGATIVE
Leukocytes,Ua: NEGATIVE
Nitrite: NEGATIVE
Specific Gravity, Urine: 1.015 (ref 1.000–1.030)
Total Protein, Urine: NEGATIVE
Urine Glucose: NEGATIVE
Urobilinogen, UA: 0.2 (ref 0.0–1.0)
pH: 8 (ref 5.0–8.0)

## 2019-08-05 LAB — LIPID PANEL
Cholesterol: 170 mg/dL (ref 0–200)
HDL: 44.3 mg/dL
LDL Cholesterol: 98 mg/dL (ref 0–99)
NonHDL: 126.06
Total CHOL/HDL Ratio: 4
Triglycerides: 140 mg/dL (ref 0.0–149.0)
VLDL: 28 mg/dL (ref 0.0–40.0)

## 2019-08-05 LAB — COMPREHENSIVE METABOLIC PANEL WITH GFR
ALT: 46 U/L (ref 0–53)
AST: 29 U/L (ref 0–37)
Albumin: 4.3 g/dL (ref 3.5–5.2)
Alkaline Phosphatase: 44 U/L (ref 39–117)
BUN: 9 mg/dL (ref 6–23)
CO2: 27 meq/L (ref 19–32)
Calcium: 9.2 mg/dL (ref 8.4–10.5)
Chloride: 105 meq/L (ref 96–112)
Creatinine, Ser: 0.9 mg/dL (ref 0.40–1.50)
GFR: 91.04 mL/min
Glucose, Bld: 117 mg/dL — ABNORMAL HIGH (ref 70–99)
Potassium: 4.5 meq/L (ref 3.5–5.1)
Sodium: 138 meq/L (ref 135–145)
Total Bilirubin: 0.5 mg/dL (ref 0.2–1.2)
Total Protein: 6.4 g/dL (ref 6.0–8.3)

## 2019-08-05 LAB — CBC
HCT: 39.4 % (ref 39.0–52.0)
Hemoglobin: 13.4 g/dL (ref 13.0–17.0)
MCHC: 33.9 g/dL (ref 30.0–36.0)
MCV: 96.8 fl (ref 78.0–100.0)
Platelets: 208 10*3/uL (ref 150.0–400.0)
RBC: 4.07 Mil/uL — ABNORMAL LOW (ref 4.22–5.81)
RDW: 12.6 % (ref 11.5–15.5)
WBC: 4.7 10*3/uL (ref 4.0–10.5)

## 2019-08-05 LAB — TSH: TSH: 1.89 u[IU]/mL (ref 0.35–4.50)

## 2019-08-05 LAB — HEMOGLOBIN A1C: Hgb A1c MFr Bld: 5.7 % (ref 4.6–6.5)

## 2019-08-05 LAB — PSA: PSA: 0.31 ng/mL (ref 0.10–4.00)

## 2019-08-05 MED ORDER — BENEFIBER PO POWD: 1.0000 | Freq: Every day | ORAL | 5 refills | Status: DC

## 2019-08-05 NOTE — Patient Instructions (Signed)
Preventive Care 40-45 Years Old, Male Preventive care refers to lifestyle choices and visits with your health care provider that can promote health and wellness. This includes:  A yearly physical exam. This is also called an annual well check.  Regular dental and eye exams.  Immunizations.  Screening for certain conditions.  Healthy lifestyle choices, such as eating a healthy diet, getting regular exercise, not using drugs or products that contain nicotine and tobacco, and limiting alcohol use. What can I expect for my preventive care visit? Physical exam Your health care provider will check:  Height and weight. These may be used to calculate body mass index (BMI), which is a measurement that tells if you are at a healthy weight.  Heart rate and blood pressure.  Your skin for abnormal spots. Counseling Your health care provider may ask you questions about:  Alcohol, tobacco, and drug use.  Emotional well-being.  Home and relationship well-being.  Sexual activity.  Eating habits.  Work and work environment. What immunizations do I need?  Influenza (flu) vaccine  This is recommended every year. Tetanus, diphtheria, and pertussis (Tdap) vaccine  You may need a Td booster every 10 years. Varicella (chickenpox) vaccine  You may need this vaccine if you have not already been vaccinated. Zoster (shingles) vaccine  You may need this after age 60. Measles, mumps, and rubella (MMR) vaccine  You may need at least one dose of MMR if you were born in 1957 or later. You may also need a second dose. Pneumococcal conjugate (PCV13) vaccine  You may need this if you have certain conditions and were not previously vaccinated. Pneumococcal polysaccharide (PPSV23) vaccine  You may need one or two doses if you smoke cigarettes or if you have certain conditions. Meningococcal conjugate (MenACWY) vaccine  You may need this if you have certain conditions. Hepatitis A vaccine   You may need this if you have certain conditions or if you travel or work in places where you may be exposed to hepatitis A. Hepatitis B vaccine  You may need this if you have certain conditions or if you travel or work in places where you may be exposed to hepatitis B. Haemophilus influenzae type b (Hib) vaccine  You may need this if you have certain risk factors. Human papillomavirus (HPV) vaccine  If recommended by your health care provider, you may need three doses over 6 months. You may receive vaccines as individual doses or as more than one vaccine together in one shot (combination vaccines). Talk with your health care provider about the risks and benefits of combination vaccines. What tests do I need? Blood tests  Lipid and cholesterol levels. These may be checked every 5 years, or more frequently if you are over 50 years old.  Hepatitis C test.  Hepatitis B test. Screening  Lung cancer screening. You may have this screening every year starting at age 55 if you have a 30-pack-year history of smoking and currently smoke or have quit within the past 15 years.  Prostate cancer screening. Recommendations will vary depending on your family history and other risks.  Colorectal cancer screening. All adults should have this screening starting at age 50 and continuing until age 75. Your health care provider may recommend screening at age 45 if you are at increased risk. You will have tests every 1-10 years, depending on your results and the type of screening test.  Diabetes screening. This is done by checking your blood sugar (glucose) after you have not eaten   for a while (fasting). You may have this done every 1-3 years.  Sexually transmitted disease (STD) testing. Follow these instructions at home: Eating and drinking  Eat a diet that includes fresh fruits and vegetables, whole grains, lean protein, and low-fat dairy products.  Take vitamin and mineral supplements as recommended  by your health care provider.  Do not drink alcohol if your health care provider tells you not to drink.  If you drink alcohol: ? Limit how much you have to 0-2 drinks a day. ? Be aware of how much alcohol is in your drink. In the [place] one drink equals one 12 oz bottle of beer (355 mL), one 5 oz glass of wine (148 mL), or one 1 oz glass of hard liquor (44 mL). Lifestyle  Take daily care of your teeth and gums.  Stay active. Exercise for at least 30 minutes on 5 or more days each week.  Do not use any products that contain nicotine or tobacco, such as cigarettes, e-cigarettes, and chewing tobacco. If you need help quitting, ask your health care provider.  If you are sexually active, practice safe sex. Use a condom or other form of protection to prevent STIs (sexually transmitted infections).  Talk with your health care provider about taking a low-dose aspirin every day starting at age 33. What's next?  Go to your health care provider once a year for a well check visit.  Ask your health care provider how often you should have your eyes and teeth checked.  Stay up to date on all vaccines. This information is not intended to replace advice given to you by your health care provider. Make sure you discuss any questions you have with your health care provider. Document Released: 10/08/2015 Document Revised: 09/05/2018 Document Reviewed: 09/05/2018 Elsevier Patient Education  2020 [employer].

## 2019-08-05 NOTE — Assessment & Plan Note (Signed)
He has been noting an increase in constipation over last month without any other significant lifestyle changes. Is noting hard, painful bowel movements only 2 x weekly recently with one episode of seeing blood after a BM since this started. Of note his mother just died at age 45 of stomach cancer this year. Referred to gastroenterology for further consideration.

## 2019-08-05 NOTE — Assessment & Plan Note (Signed)
No changes and asymptomatic, labs ordered to evaluate thyroid function

## 2019-08-05 NOTE — Assessment & Plan Note (Signed)
Check CBC 

## 2019-08-05 NOTE — Assessment & Plan Note (Signed)
Doing well in his group home and is stable on his current meds. He follows with psychiatry

## 2019-08-05 NOTE — Patient Instructions (Signed)
See you next year!  Continue to eat heart healthy diet (full of fruits, vegetables, whole grains, lean protein, water--limit salt, fat, and sugar intake) and increase physical activity as tolerated.  Continue doing brain stimulating activities (puzzles, reading, adult coloring books, staying active) to keep memory sharp.   Bring a copy of your living will and/or healthcare power of attorney to your next office visit.   Joseph [name] , Thank you for taking time to come for your Medicare Wellness Visit. I appreciate your ongoing commitment to your health goals. Please review the following plan we discussed and let me know if I can assist you in the future.   These are the goals we discussed: Goals    . Maintain healthy diet and continue exercising.       This is a list of the screening recommended for you and due dates:  Health Maintenance  Topic Date Due  . Hemoglobin A1C  09/20/2018  . Eye exam for diabetics  10/05/2018  . Complete foot exam   03/22/2019  . Tetanus Vaccine  03/21/2028  . Flu Shot  Completed  . Pneumococcal vaccine  Completed  . HIV Screening  Completed    Preventive Care 55-69 Years Old, Male Preventive care refers to lifestyle choices and visits with your health care provider that can promote health and wellness. This includes:  A yearly physical exam. This is also called an annual well check.  Regular dental and eye exams.  Immunizations.  Screening for certain conditions.  Healthy lifestyle choices, such as eating a healthy diet, getting regular exercise, not using drugs or products that contain nicotine and tobacco, and limiting alcohol use. What can I expect for my preventive care visit? Physical exam Your health care provider will check:  Height and weight. These may be used to calculate body mass index (BMI), which is a measurement that tells if you are at a healthy weight.  Heart rate and blood pressure.  Your skin for abnormal spots. Counseling  Your health care provider may ask you questions about:  Alcohol, tobacco, and drug use.  Emotional well-being.  Home and relationship well-being.  Sexual activity.  Eating habits.  Work and work Statistician. What immunizations do I need?  Influenza (flu) vaccine  This is recommended every year. Tetanus, diphtheria, and pertussis (Tdap) vaccine  You may need a Td booster every 10 years. Varicella (chickenpox) vaccine  You may need this vaccine if you have not already been vaccinated. Zoster (shingles) vaccine  You may need this after age 91. Measles, mumps, and rubella (MMR) vaccine  You may need at least one dose of MMR if you were born in 1957 or later. You may also need a second dose. Pneumococcal conjugate (PCV13) vaccine  You may need this if you have certain conditions and were not previously vaccinated. Pneumococcal polysaccharide (PPSV23) vaccine  You may need one or two doses if you smoke cigarettes or if you have certain conditions. Meningococcal conjugate (MenACWY) vaccine  You may need this if you have certain conditions. Hepatitis A vaccine  You may need this if you have certain conditions or if you travel or work in places where you may be exposed to hepatitis A. Hepatitis B vaccine  You may need this if you have certain conditions or if you travel or work in places where you may be exposed to hepatitis B. Haemophilus influenzae type b (Hib) vaccine  You may need this if you have certain risk factors. Human papillomavirus (HPV)  vaccine  If recommended by your health care provider, you may need three doses over 6 months. You may receive vaccines as individual doses or as more than one vaccine together in one shot (combination vaccines). Talk with your health care provider about the risks and benefits of combination vaccines. What tests do I need? Blood tests  Lipid and cholesterol levels. These may be checked every 5 years, or more frequently if you  are over 76 years old.  Hepatitis C test.  Hepatitis B test. Screening  Lung cancer screening. You may have this screening every year starting at age 35 if you have a 30-pack-year history of smoking and currently smoke or have quit within the past 15 years.  Prostate cancer screening. Recommendations will vary depending on your family history and other risks.  Colorectal cancer screening. All adults should have this screening starting at age 8 and continuing until age 71. Your health care provider may recommend screening at age 42 if you are at increased risk. You will have tests every 1-10 years, depending on your results and the type of screening test.  Diabetes screening. This is done by checking your blood sugar (glucose) after you have not eaten for a while (fasting). You may have this done every 1-3 years.  Sexually transmitted disease (STD) testing. Follow these instructions at home: Eating and drinking  Eat a diet that includes fresh fruits and vegetables, whole grains, lean protein, and low-fat dairy products.  Take vitamin and mineral supplements as recommended by your health care provider.  Do not drink alcohol if your health care provider tells you not to drink.  If you drink alcohol: ? Limit how much you have to 0-2 drinks a day. ? Be aware of how much alcohol is in your drink. In the [place] one drink equals one 12 oz bottle of beer (355 mL), one 5 oz glass of wine (148 mL), or one 1 oz glass of hard liquor (44 mL). Lifestyle  Take daily care of your teeth and gums.  Stay active. Exercise for at least 30 minutes on 5 or more days each week.  Do not use any products that contain nicotine or tobacco, such as cigarettes, e-cigarettes, and chewing tobacco. If you need help quitting, ask your health care provider.  If you are sexually active, practice safe sex. Use a condom or other form of protection to prevent STIs (sexually transmitted infections).  Talk with your  health care provider about taking a low-dose aspirin every day starting at age 51. What's next?  Go to your health care provider once a year for a well check visit.  Ask your health care provider how often you should have your eyes and teeth checked.  Stay up to date on all vaccines. This information is not intended to replace advice given to you by your health care provider. Make sure you discuss any questions you have with your health care provider. Document Released: 10/08/2015 Document Revised: 09/05/2018 Document Reviewed: 09/05/2018 Elsevier Patient Education  2020 [employer].

## 2019-08-05 NOTE — Assessment & Plan Note (Addendum)
Patient notes it is an ongoing problem over many years and not escalating rapidly but will refer to urology for further consideration. Have ordered PSA and UA with culture today

## 2019-08-05 NOTE — Assessment & Plan Note (Signed)
Encouraged heart healthy diet, increase exercise, avoid trans fats and processed foods, statin intolerant

## 2019-08-05 NOTE — Progress Notes (Signed)
Subjective:    Patient ID: Joseph [name], male    DOB: Oct 26, 1975, 43 y.o.   MRN: [id number]  No chief complaint on file.   HPI Patient is in today for follow up on chronic medical concerns including hyperlipidemia, anemia, diabetes and ongoing medical care. He feels well today. No recent febrile illness or hospitalizations. He continues to still live in the same [place] and he reports it is going well. He is sleeping well. He is walking daily, tries to maintain a heart healthy diet. He reports they are masking well at home and he has not had any acute concerns about infection. He notes he has a long history of urinary hesitancy which may be worsening. No hematuria or dysuria. Not worsening rapidly. He has been noting an increase in constipation over last month without any other significant lifestyle changes. Is noting hard, painful bowel movements only 2 x weekly recently with one episode of seeing blood after a BM since this started. Of note his mother just died at age 24 of stomach cancer this year. Denies CP/palp/SOB/HA/congestion/fevers. Taking meds as prescribed  Past Medical History:  Diagnosis Date  . Anemia    post operative  . Antiphospholipid syndrome ([place]) 08/15/2011  . Anxiety   . Cellulitis    bilateral thighs  . Cerumen impaction 07/13/2013  . 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 Elevated BP 10/20/2012  . Enlarged thyroid 03/07/2015  . H/O tobacco use, presenting hazards to health 12/01/2011   Quit October 2014   . Hyperlipidemia   . Lupus anticoagulant positive 08/15/2011  . Medicare annual wellness visit, subsequent 03/19/2016  . Obesity   . Schizophrenia [facility])     Past Surgical History:  Procedure Laterality Date  . eye surgery     b/l laser eye sx 2004  . I&D EXTREMITY     left leg  . LEG SURGERY    . TONSILLECTOMY    . WISDOM TOOTH EXTRACTION      Family History  Problem Relation Age of Onset  . GER disease Father   . Diverticulosis Father   . Cancer  Mother        stomach CA dx at 37  . Colon cancer Neg Hx     Social History   Socioeconomic History  . Marital status: Single    Spouse name: Not on file  . Number of children: 0  . Years of education: Not on file  . Highest education level: Not on file  Occupational History    Employer: [employer]    Comment: [employer]  Social Needs  . Financial resource strain: Not on file  . Food insecurity    Worry: Not on file    Inability: Not on file  . Transportation needs    Medical: Not on file    Non-medical: Not on file  Tobacco Use  . Smoking status: Former Smoker    Packs/day: 1.00    Years: 1.00    Pack years: 1.00    Types: Cigarettes    Start date: 07/15/2012    Quit date: 06/13/2013    Years since quitting: 6.1  . Smokeless tobacco: Former Systems developer    Types: Chew  Substance and Sexual Activity  . Alcohol use: No    Alcohol/week: 28.0 standard drinks    Types: 28 Cans of beer per week    Comment: pt claims he drinks 4, 40 oz cans of malt liquor every day  .  Drug use: No  . Sexual activity: Not Currently  Lifestyle  . Physical activity    Days per week: Not on file    Minutes per session: Not on file  . Stress: Not on file  Relationships  . Social Herbalist on phone: Not on file    Gets together: Not on file    Attends religious service: Not on file    Active member of club or organization: Not on file    Attends meetings of clubs or organizations: Not on file    Relationship status: Not on file  . Intimate partner violence    Fear of current or ex partner: Not on file    Emotionally abused: Not on file    Physically abused: Not on file    Forced sexual activity: Not on file  Other Topics Concern  . Not on file  Social History Narrative  . Not on file    Outpatient Medications Prior to Visit  Medication Sig Dispense Refill  . ASPIRIN LOW DOSE 81 MG EC tablet TAKE 1 TABLET EVERY DAY 30 tablet 11  . clonazePAM (KLONOPIN) 1 MG tablet Take  1 mg by mouth 2 (two) times daily. And As needed For anxiety    . divalproex (DEPAKOTE) 500 MG EC tablet 1 in a.m., 2 at bedtime    . DOK 100 MG capsule TAKE 1 CAPSULE BY MOUTH TWICE DAILY 60 capsule 11  . FLUoxetine (PROZAC) 20 MG capsule Take 20 mg by mouth 2 (two) times daily.   0  . ibuprofen (ADVIL) 600 MG tablet TAKE 1 TABLET BY MOUTH EVERY 8 HOURS AS NEEDED 30 tablet 2  . ibuprofen (RA IBUPROFEN) 200 MG tablet take 1 tablet by mouth every 6 hours if needed 100 tablet 6  . lisinopril (ZESTRIL) 2.5 MG tablet TAKE 1 TABLET BY MOUTH DAILY 30 tablet 11  . loratadine (CLARITIN) 10 MG tablet TAKE 1 TABLET BY MOUTH ONCE DAILY 30 tablet 11  . Multiple Vitamins-Minerals (ONE DAILY FOR MEN/LYCOPENE) TABS TAKE 1 TABLET BY MOUTH EVERY DAY 100 tablet 3  . OLANZapine (ZYPREXA) 20 MG tablet Take 40 mg by mouth at bedtime. May take an extra 20mg  as needed     No facility-administered medications prior to visit.     Allergies  Allergen Reactions  . Haldol [Haloperidol Decanoate]     Painful   . Statins     ? Related to compartment syndrome    Review of Systems  Constitutional: Negative for chills, fever and malaise/fatigue.  HENT: Negative for congestion and hearing loss.   Eyes: Negative for discharge.  Respiratory: Negative for cough, sputum production and shortness of breath.   Cardiovascular: Negative for chest pain, palpitations and leg swelling.  Gastrointestinal: Positive for blood in stool and constipation. Negative for abdominal pain, diarrhea, heartburn, melena, nausea and vomiting.  Genitourinary: Positive for flank pain and frequency. Negative for dysuria, hematuria and urgency.  Musculoskeletal: Negative for back pain, falls and myalgias.  Skin: Negative for rash.  Neurological: Negative for dizziness, sensory change, loss of consciousness, weakness and headaches.  Endo/Heme/Allergies: Negative for environmental allergies. Does not bruise/bleed easily.  Psychiatric/Behavioral:  Negative for depression and suicidal ideas. The patient is not nervous/anxious and does not have insomnia.        Objective:    Physical Exam Vitals signs and nursing note reviewed.  Constitutional:      General: He is not in acute distress.    Appearance: Normal  appearance. He is well-developed. He is obese. He is not ill-appearing.  HENT:     Head: Normocephalic and atraumatic.     Right Ear: Tympanic membrane and external ear normal.     Left Ear: Tympanic membrane and external ear normal.     Nose: Nose normal.  Eyes:     General:        Right eye: No discharge.        Left eye: No discharge.     Extraocular Movements: Extraocular movements intact.     Conjunctiva/sclera: Conjunctivae normal.     Pupils: Pupils are equal, round, and reactive to light.  Neck:     Musculoskeletal: Normal range of motion and neck supple.  Cardiovascular:     Rate and Rhythm: Normal rate and regular rhythm.     Heart sounds: No murmur.  Pulmonary:     Effort: Pulmonary effort is normal.     Breath sounds: Normal breath sounds. No wheezing.  Abdominal:     General: Bowel sounds are normal.     Palpations: Abdomen is soft. There is no mass.     Tenderness: There is no abdominal tenderness. There is no guarding.  Musculoskeletal:     Right lower leg: No edema.     Left lower leg: No edema.  Skin:    General: Skin is warm and dry.  Neurological:     Mental Status: He is alert and oriented to person, place, and time.     Deep Tendon Reflexes: Reflexes normal.  Psychiatric:        Mood and Affect: Mood normal.        Thought Content: Thought content normal.     There were no vitals taken for this visit. Wt Readings from Last 3 Encounters:  08/05/19 269 lb 3.2 oz (122.1 kg)  07/07/19 246 lb 9.6 oz (111.9 kg)  07/08/18 270 lb 12.8 oz (122.8 kg)    Diabetic Foot Exam - Simple   No data filed     Lab Results  Component Value Date   WBC 7.6 03/12/2017   HGB 14.1 03/12/2017   HCT  42.3 03/12/2017   PLT 242.0 03/12/2017   GLUCOSE 95 03/21/2018   CHOL 173 03/21/2018   TRIG 153.0 (H) 03/21/2018   HDL 49.00 03/21/2018   LDLCALC 93 03/21/2018   ALT 38 03/21/2018   AST 28 03/21/2018   NA 136 03/21/2018   K 4.1 03/21/2018   CL 100 03/21/2018   CREATININE 0.93 03/21/2018   BUN 10 03/21/2018   CO2 28 03/21/2018   TSH 1.69 03/07/2016   HGBA1C 5.9 03/21/2018   MICROALBUR 0.50 07/03/2013    Lab Results  Component Value Date   TSH 1.69 03/07/2016   Lab Results  Component Value Date   WBC 7.6 03/12/2017   HGB 14.1 03/12/2017   HCT 42.3 03/12/2017   MCV 95.8 03/12/2017   PLT 242.0 03/12/2017   Lab Results  Component Value Date   NA 136 03/21/2018   K 4.1 03/21/2018   CO2 28 03/21/2018   GLUCOSE 95 03/21/2018   BUN 10 03/21/2018   CREATININE 0.93 03/21/2018   BILITOT 0.4 03/21/2018   ALKPHOS 47 03/21/2018   AST 28 03/21/2018   ALT 38 03/21/2018   PROT 6.7 03/21/2018   ALBUMIN 4.4 03/21/2018   CALCIUM 9.2 03/21/2018   GFR 93.75 03/21/2018   Lab Results  Component Value Date   CHOL 173 03/21/2018   Lab Results  Component  Value Date   HDL 49.00 03/21/2018   Lab Results  Component Value Date   LDLCALC 93 03/21/2018   Lab Results  Component Value Date   TRIG 153.0 (H) 03/21/2018   Lab Results  Component Value Date   CHOLHDL 4 03/21/2018   Lab Results  Component Value Date   HGBA1C 5.9 03/21/2018       Assessment & Plan:   Problem List Items Addressed This Visit    Iron deficiency anemia    Check CBC      Relevant Orders   CBC   Schizoaffective disorder ([place])    Doing well in his group home and is stable on his current meds. He follows with psychiatry      Diabetes mellitus ([place])     minimize simple carbs. Increase exercise as tolerated. Continue current meds, is following with Dr Chalmers Cater, endocrinology. No documentation available today. Will monitor labs      Relevant Orders   A1C   CMP   Hyperlipidemia, mild     Encouraged heart healthy diet, increase exercise, avoid trans fats and processed foods, statin intolerant      Relevant Orders   Lipid panel   H/O tobacco use, presenting hazards to health    Had been vaping but has completely stopped this use.       Abnormal liver enzymes   Relevant Orders   CMP   PPD positive    Quantiferon blood work ordered today and a CXR obtained to satisfy group home requirements. asymptomatic      Relevant Orders   QuantiFERON-TB Gold Plus   DG Chest 2 View   Enlarged thyroid    No changes and asymptomatic, labs ordered to evaluate thyroid function      Relevant Orders   TSH   Morbid obesity ([place])    Encouraged to increase activity and maintain a heart healthy diet      Change in bowel habits    He has been noting an increase in constipation over last month without any other significant lifestyle changes. Is noting hard, painful bowel movements only 2 x weekly recently with one episode of seeing blood after a BM since this started. Of note his mother just died at age 50 of stomach cancer this year. Referred to gastroenterology for further consideration.       Relevant Orders   Ambulatory referral to Gastroenterology   Blood in stool   Relevant Orders   Ambulatory referral to Gastroenterology   Urinary hesitancy - Primary    Patient notes it is an ongoing problem over many years and not escalating rapidly but will refer to urology for further consideration. Have ordered PSA and UA with culture today      Relevant Orders   Ambulatory referral to Urology   PSA   Urinalysis (LAB COLLECT)   Urine Culture      I am having Joseph [name] start on Benefiber. I am also having him maintain his clonazePAM, divalproex, OLANZapine, FLUoxetine, ibuprofen, loratadine, lisinopril, Aspirin Low Dose, DOK, ibuprofen, and One Daily For Men/Lycopene.  Meds ordered this encounter  Medications  . Wheat Dextrin (BENEFIBER) POWD    Sig: Take 1 Dose by mouth  daily at 2 PM. Mix in 8 oz of fluids    Dispense:  730 g    Refill:  5     Penni Homans, MD

## 2019-08-05 NOTE — Assessment & Plan Note (Signed)
minimize simple carbs. Increase exercise as tolerated. Continue current meds, is following with Dr Chalmers Cater, endocrinology. No documentation available today. Will monitor labs

## 2019-08-05 NOTE — Assessment & Plan Note (Signed)
Quantiferon blood work ordered today and a CXR obtained to satisfy group home requirements. asymptomatic

## 2019-08-05 NOTE — Assessment & Plan Note (Signed)
Encouraged to increase activity and maintain a heart healthy diet

## 2019-08-05 NOTE — Assessment & Plan Note (Signed)
Had been vaping but has completely stopped this use.

## 2019-08-06 ENCOUNTER — Encounter: Payer: Self-pay | Admitting: Nurse Practitioner

## 2019-08-08 LAB — URINE CULTURE
MICRO NUMBER:: 1084124
SPECIMEN QUALITY:: ADEQUATE

## 2019-08-08 LAB — QUANTIFERON-TB GOLD PLUS
Mitogen-NIL: >10 IU/mL
NIL: 0.05 IU/mL
QuantiFERON-TB Gold Plus: POSITIVE — AB
TB1-NIL: 0.94 IU/mL
TB2-NIL: 1.16 IU/mL

## 2019-08-15 ENCOUNTER — Encounter: Payer: Self-pay | Admitting: Nurse Practitioner

## 2019-08-15 ENCOUNTER — Ambulatory Visit (INDEPENDENT_AMBULATORY_CARE_PROVIDER_SITE_OTHER): Payer: Medicare Other | Admitting: Nurse Practitioner

## 2019-08-15 VITALS — BP 106/52 | HR 80 | Temp 97.8°F | Ht 73.0 in | Wt 268.0 lb

## 2019-08-15 DIAGNOSIS — K5900 Constipation, unspecified: Secondary | ICD-10-CM | POA: Diagnosis not present

## 2019-08-15 DIAGNOSIS — Z1211 Encounter for screening for malignant neoplasm of colon: Secondary | ICD-10-CM | POA: Diagnosis not present

## 2019-08-15 DIAGNOSIS — Z1159 Encounter for screening for other viral diseases: Secondary | ICD-10-CM

## 2019-08-15 DIAGNOSIS — K625 Hemorrhage of anus and rectum: Secondary | ICD-10-CM | POA: Diagnosis not present

## 2019-08-15 MED ORDER — NA SULFATE-K SULFATE-MG SULF 17.5-3.13-1.6 GM/177ML PO SOLN: ORAL | 0 refills | Status: DC

## 2019-08-15 NOTE — Progress Notes (Signed)
Assessment and plan reviewed 

## 2019-08-15 NOTE — Patient Instructions (Addendum)
If you are age 45 or older, your body mass index should be between 23-30. Your Body mass index is 35.36 kg/m. If this is out of the aforementioned range listed, please consider follow up with your Primary Care Provider.  If you are age 75 or younger, your body mass index should be between 19-25. Your Body mass index is 35.36 kg/m. If this is out of the aformentioned range listed, please consider follow up with your Primary Care Provider.   You have been scheduled for a colonoscopy. Please follow written instructions given to you at your visit today.  Please pick up your prep supplies at the pharmacy within the next 1-3 days. If you use inhalers (even only as needed), please bring them with you on the day of your procedure. Your physician has requested that you go to www.startemmi.com and enter the access code given to you at your visit today. This web site gives a general overview about your procedure. However, you should still follow specific instructions given to you by our office regarding your preparation for the procedure.  We have sent the following medications to your pharmacy for you to pick up at your convenience: Suprep  Continue Stool Softener and Fiber.  Thank you for choosing me and [facility].   Tye Savoy, NP

## 2019-08-15 NOTE — Progress Notes (Signed)
ASSESSMENT / PLAN:   10.  43 year old male with chronic constipation.  Improving after addition of fiber supplement to stool softener. -Continue above regimen.  If constipation does not continue to improve patient will call our office for further recommendations  2. Minor rectal bleeding in setting of constipation.  Suspect perianal etiology such as internal hemorrhoids.  Further evaluation at time of colonoscopy  3. Colon cancer screening. Will scheduled him for colonoscopy -The risks and benefits of colonoscopy with possible polypectomy / biopsies were discussed and the patient agrees to proceed.   HPI:    Referring Provider:    Penni Homans, MD Reason for referral:    constipation  Chief Complaint:   Constipation  Joseph [name] is a 43 year old male, resident of a group home with PMH significant for hyperlipidemia, diabetes, and obesity. He has chronic constipation which he defines as hard, difficult to expel stool.  At baseline he has one BM a week.  He took Colrite stool softener which worked for about a year then stopped working.  The only new medication patient has started is Claritin . He recently added fiber powder and things are improving. He drinks at least 64 ounces of water daily.  However periodically has blood in his stool.  He had an episode of nausea yesterday but this was isolated event.  No other GI complaints.   Data Reviewed:  08/05/2019  CMET -ok CBC-ok   Past Medical History:  Diagnosis Date  . Anemia    post operative  . Antiphospholipid syndrome ([place]) 08/15/2011  . Anxiety   . Cellulitis    bilateral thighs  . Cerumen impaction 07/13/2013  . 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 Depression   . Diabetes ([place])    no medications   . Elevated BP 10/20/2012  . Enlarged thyroid 03/07/2015  . H/O tobacco use, presenting hazards to health 12/01/2011   Quit October 2014   . Hyperlipidemia   . Lupus anticoagulant positive 08/15/2011  . Medicare annual  wellness visit, subsequent 03/19/2016  . Obesity   . Schizophrenia ([place])   . Sleep apnea      Past Surgical History:  Procedure Laterality Date  . eye surgery     b/l laser eye sx 2004  . I&D EXTREMITY     left leg  . LEG SURGERY    . TONSILLECTOMY    . WISDOM TOOTH EXTRACTION     Family History  Problem Relation Age of Onset  . Diverticulosis Father   . Stomach cancer Mother        stomach CA dx at 12  . GER disease Mother   . Colon cancer Neg Hx   . Pancreatic cancer Neg Hx   . Esophageal cancer Neg Hx    Social History   Tobacco Use  . Smoking status: Former Smoker    Packs/day: 1.00    Years: 1.00    Pack years: 1.00    Types: Cigarettes    Start date: 07/15/2012    Quit date: 06/13/2013    Years since quitting: 6.1  . Smokeless tobacco: Former Systems developer    Types: Chew  Substance Use Topics  . Alcohol use: No    Alcohol/week: 28.0 standard drinks    Types: 28 Cans of beer per week  . Drug use: No   Current Outpatient Medications  Medication Sig Dispense Refill  . ASPIRIN LOW DOSE 81 MG  EC tablet TAKE 1 TABLET EVERY DAY 30 tablet 11  . clonazePAM (KLONOPIN) 1 MG tablet Take 1 mg by mouth 2 (two) times daily. And As needed For anxiety    . divalproex (DEPAKOTE) 500 MG EC tablet 1 in a.m., 2 at bedtime    . DOK 100 MG capsule TAKE 1 CAPSULE BY MOUTH TWICE DAILY 60 capsule 11  . FLUoxetine (PROZAC) 20 MG capsule Take 20 mg by mouth 2 (two) times daily.   0  . ibuprofen (ADVIL) 600 MG tablet TAKE 1 TABLET BY MOUTH EVERY 8 HOURS AS NEEDED 30 tablet 2  . ibuprofen (RA IBUPROFEN) 200 MG tablet take 1 tablet by mouth every 6 hours if needed 100 tablet 6  . lisinopril (ZESTRIL) 2.5 MG tablet TAKE 1 TABLET BY MOUTH DAILY 30 tablet 11  . loratadine (CLARITIN) 10 MG tablet TAKE 1 TABLET BY MOUTH ONCE DAILY 30 tablet 11  . Multiple Vitamins-Minerals (ONE DAILY FOR MEN/LYCOPENE) TABS TAKE 1 TABLET BY MOUTH EVERY DAY 100 tablet 3  . OLANZapine (ZYPREXA) 20 MG tablet Take 40 mg  by mouth at bedtime. May take an extra 20mg  as needed    . Wheat Dextrin (BENEFIBER) POWD Take 1 Dose by mouth daily at 2 PM. Mix in 8 oz of fluids 730 g 5   No current facility-administered medications for this visit.    Allergies  Allergen Reactions  . Haldol [Haloperidol Decanoate]     Painful   . Statins     ? Related to compartment syndrome     Review of Systems: Positive for anxiety, depression, headaches and sore throat.  All other systems reviewed and negative except where noted in HPI.   Serum creatinine: 0.9 mg/dL 08/05/19 0951 Estimated creatinine clearance: 141.6 mL/min   Physical Exam:    Wt Readings from Last 3 Encounters:  08/15/19 268 lb (121.6 kg)  08/05/19 269 lb 3.2 oz (122.1 kg)  07/07/19 246 lb 9.6 oz (111.9 kg)    BP (!) 106/52   Pulse 80   Temp 97.8 F (36.6 C)   Ht 6\' 1"  (1.854 m)   Wt 268 lb (121.6 kg)   BMI 35.36 kg/m  Constitutional:  Pleasant male in no acute distress. Psychiatric: Normal mood and affect. Behavior is normal. EENT: Pupils normal.  Conjunctivae are normal. No scleral icterus. Neck supple.  Cardiovascular: Normal rate, regular rhythm. No edema Pulmonary/chest: Effort normal and breath sounds normal. No wheezing, rales or rhonchi. Abdominal: Soft, nondistended, nontender. Bowel sounds active throughout. There are no masses palpable. No hepatomegaly. Neurological: Alert and oriented to person place and time. Skin: Skin is warm and dry. No rashes noted.  Joseph Savoy, Joseph [name]  08/15/2019, 9:23 AM  Cc: Mosie Lukes, MD

## 2019-08-25 ENCOUNTER — Other Ambulatory Visit: Payer: Self-pay

## 2019-08-25 DIAGNOSIS — Z20822 Contact with and (suspected) exposure to covid-19: Secondary | ICD-10-CM

## 2019-08-25 DIAGNOSIS — Z20828 Contact with and (suspected) exposure to other viral communicable diseases: Secondary | ICD-10-CM | POA: Diagnosis not present

## 2019-08-26 LAB — NOVEL CORONAVIRUS, NAA: SARS-CoV-2, NAA: NOT DETECTED

## 2019-09-04 ENCOUNTER — Ambulatory Visit (INDEPENDENT_AMBULATORY_CARE_PROVIDER_SITE_OTHER): Payer: Medicare Other

## 2019-09-04 ENCOUNTER — Other Ambulatory Visit: Payer: Self-pay | Admitting: Internal Medicine

## 2019-09-04 DIAGNOSIS — Z1159 Encounter for screening for other viral diseases: Secondary | ICD-10-CM

## 2019-09-04 LAB — SARS CORONAVIRUS 2 (TAT 6-24 HRS): SARS Coronavirus 2: NEGATIVE

## 2019-09-08 ENCOUNTER — Encounter: Payer: Self-pay | Admitting: Internal Medicine

## 2019-09-08 ENCOUNTER — Ambulatory Visit (AMBULATORY_SURGERY_CENTER): Payer: Medicare Other | Admitting: Internal Medicine

## 2019-09-08 ENCOUNTER — Other Ambulatory Visit: Payer: Self-pay

## 2019-09-08 VITALS — BP 111/69 | HR 62 | Temp 98.4°F | Resp 15 | Ht 73.0 in | Wt 268.0 lb

## 2019-09-08 DIAGNOSIS — Z538 Procedure and treatment not carried out for other reasons: Secondary | ICD-10-CM | POA: Diagnosis not present

## 2019-09-08 DIAGNOSIS — K5900 Constipation, unspecified: Secondary | ICD-10-CM

## 2019-09-08 DIAGNOSIS — K625 Hemorrhage of anus and rectum: Secondary | ICD-10-CM | POA: Diagnosis not present

## 2019-09-08 MED ORDER — SODIUM CHLORIDE 0.9 % IV SOLN: 500.0000 mL | Freq: Once | INTRAVENOUS | Status: DC

## 2019-09-08 NOTE — Op Note (Signed)
[facility] Patient Name: Joseph [name] Procedure Date: 09/08/2019 1:52 PM MRN: [id number] Endoscopist: Docia Chuck. Henrene Pastor , MD Age: 42 Referring MD:  Date of Birth: 1975-11-25 Gender: Male Account #: [id number] Procedure:                Colonoscopy Indications:              Rectal bleeding Medicines:                Monitored Anesthesia Care Procedure:                Pre-Anesthesia Assessment:                           - Prior to the procedure, a History and Physical                            was performed, and patient medications and                            allergies were reviewed. The patient's tolerance of                            previous anesthesia was also reviewed. The risks                            and benefits of the procedure and the sedation                            options and risks were discussed with the patient.                            All questions were answered, and informed consent                            was obtained. Prior Anticoagulants: The patient has                            taken no previous anticoagulant or antiplatelet                            agents. ASA Grade Assessment: II - A patient with                            mild systemic disease. After reviewing the risks                            and benefits, the patient was deemed in                            satisfactory condition to undergo the procedure.                           After obtaining informed consent, the colonoscope  was passed under direct vision. Throughout the                            procedure, the patient's blood pressure, pulse, and                            oxygen saturations were monitored continuously. The                            Colonoscope was introduced through the anus with                            the intention of advancing to the cecum. The scope                            was advanced to the sigmoid colon before the                           procedure was aborted. Medications were given. The                            rectum was photographed. The quality of the bowel                            preparation was poor. The colonoscopy was performed                            without difficulty. The patient tolerated the                            procedure well. The bowel preparation used was                            SUPREP via split dose instruction. Scope In: 2:02:23 PM Scope Out: 2:03:59 PM Total Procedure Duration: 0 hours 1 minute 36 seconds  Findings:                 The colonic preparation was poor. The examination                            was terminated in the region of the sigmoid colon. Complications:            No immediate complications. Estimated blood loss:                            None. Estimated Blood Loss:     Estimated blood loss: none. Impression:               - Preparation of the colon was poor.                           - Terminated procedure to the sigmoid colon.                           - No specimens collected.  Recommendation:           - Repeat colonoscopy in the next 3 months because                            the examination was incomplete due to poor                            preparation. THE PATIENT WILL NEED DOUBLE PREP with                            2 days of clear liquids. 1 bottle of magnesium                            citrate in the morning the day before the procedure                            followed by standard split prep such as Suprep.                           - Patient has a contact number available for                            emergencies. The signs and symptoms of potential                            delayed complications were discussed with the                            patient. Return to normal activities tomorrow.                            Written discharge instructions were provided to the                            patient.                            - Resume previous diet.                           - Continue present medications. Docia Chuck. Henrene Pastor, MD 09/08/2019 2:09:37 PM This report has been signed electronically.

## 2019-09-08 NOTE — Patient Instructions (Signed)
Discharge instructions given. Poor prep. Patient to be rescheduled for repeat colonoscopy in the next 3 months.  Group home need to call and reschedule to repeat colonoscopy for 3 months. Read procedure report for details. resume previous diet. Continue present medications. YOU HAD AN ENDOSCOPIC PROCEDURE TODAY AT [facility]:   Refer to the procedure report that was given to you for any specific questions about what was found during the examination.  If the procedure report does not answer your questions, please call your gastroenterologist to clarify.  If you requested that your care partner not be given the details of your procedure findings, then the procedure report has been included in a sealed envelope for you to review at your convenience later.  YOU SHOULD EXPECT: Some feelings of bloating in the abdomen. Passage of more gas than usual.  Walking can help get rid of the air that was put into your GI tract during the procedure and reduce the bloating. If you had a lower endoscopy (such as a colonoscopy or flexible sigmoidoscopy) you may notice spotting of blood in your stool or on the toilet paper. If you underwent a bowel prep for your procedure, you may not have a normal bowel movement for a few days.  Please Note:  You might notice some irritation and congestion in your nose or some drainage.  This is from the oxygen used during your procedure.  There is no need for concern and it should clear up in a day or so.  SYMPTOMS TO REPORT IMMEDIATELY:   Following lower endoscopy (colonoscopy or flexible sigmoidoscopy):  Excessive amounts of blood in the stool  Significant tenderness or worsening of abdominal pains  Swelling of the abdomen that is new, acute  Fever of 100F or higher   For urgent or emergent issues, a gastroenterologist can be reached at any hour by calling [phone].   DIET:  We do recommend a small meal at first, but then you may proceed to your  regular diet.  Drink plenty of fluids but you should avoid alcoholic beverages for 24 hours.  ACTIVITY:  You should plan to take it easy for the rest of today and you should NOT DRIVE or use heavy machinery until tomorrow (because of the sedation medicines used during the test).    FOLLOW UP: Our staff will call the number listed on your records 48-72 hours following your procedure to check on you and address any questions or concerns that you may have regarding the information given to you following your procedure. If we do not reach you, we will leave a message.  We will attempt to reach you two times.  During this call, we will ask if you have developed any symptoms of COVID 19. If you develop any symptoms (ie: fever, flu-like symptoms, shortness of breath, cough etc.) before then, please call [phone].  If you test positive for Covid 19 in the 2 weeks post procedure, please call and report this information to [place].    If any biopsies were taken you will be contacted by phone or by letter within the next 1-3 weeks.  Please call us at [phone] if you have not heard about the biopsies in 3 weeks.    SIGNATURES/CONFIDENTIALITY: You and/or your care partner have signed paperwork which will be entered into your electronic medical record.  These signatures attest to the fact that that the information above on your After Visit Summary has been reviewed and is understood.  Full responsibility of the confidentiality of this discharge information lies with you and/or your care-partner.

## 2019-09-08 NOTE — Progress Notes (Signed)
PT taken to PACU. Monitors in place. VSS. Report given to RN. 

## 2019-09-08 NOTE — Progress Notes (Signed)
Person responsible for Joseph [name] discharged instructions given to her. Group home will call back to reschedule appointment.patient is mentally challenge.

## 2019-09-09 ENCOUNTER — Other Ambulatory Visit: Payer: Self-pay | Admitting: Family Medicine

## 2019-09-10 ENCOUNTER — Telehealth: Payer: Self-pay

## 2019-09-10 NOTE — Telephone Encounter (Signed)
  Follow up Call-  Call back number 09/08/2019  Post procedure Call Back phone  # [phone]  Permission to leave phone message Yes  Some recent data might be hidden     Patient questions:  Do you have a fever, pain , or abdominal swelling? No. Pain Score  0 *  Have you tolerated food without any problems? Yes.    Have you been able to return to your normal activities? Yes.    Do you have any questions about your discharge instructions: Diet   No. Medications  No. Follow up visit  No.  Do you have questions or concerns about your Care? No.  Actions: * If pain score is 4 or above: No action needed, pain <4.  1. Have you developed a fever since your procedure? no  2.   Have you had an respiratory symptoms (SOB or cough) since your procedure? no  3.   Have you tested positive for COVID 19 since your procedure no  4.   Have you had any family members/close contacts diagnosed with the COVID 19 since your procedure?  no   If yes to any of these questions please route to Joylene John, RN and Alphonsa Gin, Therapist, sports.

## 2019-09-15 ENCOUNTER — Other Ambulatory Visit: Payer: Medicare Other

## 2019-09-30 DIAGNOSIS — E7800 Pure hypercholesterolemia, unspecified: Secondary | ICD-10-CM | POA: Diagnosis not present

## 2019-09-30 DIAGNOSIS — E119 Type 2 diabetes mellitus without complications: Secondary | ICD-10-CM | POA: Diagnosis not present

## 2019-10-07 DIAGNOSIS — E7800 Pure hypercholesterolemia, unspecified: Secondary | ICD-10-CM | POA: Diagnosis not present

## 2019-10-07 DIAGNOSIS — E663 Overweight: Secondary | ICD-10-CM | POA: Diagnosis not present

## 2019-10-07 DIAGNOSIS — E119 Type 2 diabetes mellitus without complications: Secondary | ICD-10-CM | POA: Diagnosis not present

## 2019-11-17 ENCOUNTER — Ambulatory Visit: Payer: Medicare Other | Attending: Family

## 2019-11-17 DIAGNOSIS — Z23 Encounter for immunization: Secondary | ICD-10-CM | POA: Insufficient documentation

## 2019-11-17 NOTE — Progress Notes (Signed)
   Covid-19 Vaccination Clinic  Name:  Joseph [name]    MRN: [id number] DOB: November 10, 1975  11/17/2019  [name] was observed post Covid-19 immunization for 15 minutes without incidence. He was provided with Vaccine Information Sheet and instruction to access the V-Safe system.   [name] was instructed to call 911 with any severe reactions post vaccine: Marland Kitchen Difficulty breathing  . Swelling of your face and throat  . A fast heartbeat  . A bad rash all over your body  . Dizziness and weakness    Immunizations Administered    Name Date Dose VIS Date Route   Moderna COVID-19 Vaccine 11/17/2019  9:44 AM 0.5 mL 08/26/2019 Intramuscular   Manufacturer: Moderna   Lot: [id number]   AronaVO:[id number]

## 2019-12-16 ENCOUNTER — Ambulatory Visit: Payer: Medicare Other | Attending: Family

## 2019-12-16 DIAGNOSIS — Z23 Encounter for immunization: Secondary | ICD-10-CM

## 2019-12-16 NOTE — Progress Notes (Signed)
   Covid-19 Vaccination Clinic  Name:  RAYNALD PARIS    MRN: [id number] DOB: 1975/12/05  12/16/2019  Mr. Berchtold was observed post Covid-19 immunization for 15 minutes without incident. He was provided with Vaccine Information Sheet and instruction to access the V-Safe system.   Mr. Evaristo was instructed to call 911 with any severe reactions post vaccine: Marland Kitchen Difficulty breathing  . Swelling of face and throat  . A fast heartbeat  . A bad rash all over body  . Dizziness and weakness   Immunizations Administered    Name Date Dose VIS Date Route   Moderna COVID-19 Vaccine 12/16/2019  4:22 PM 0.5 mL 08/26/2019 Intramuscular   Manufacturer: Moderna   LotMV:[id number]   [id number]

## 2020-01-21 ENCOUNTER — Other Ambulatory Visit: Payer: Self-pay

## 2020-01-21 ENCOUNTER — Ambulatory Visit (INDEPENDENT_AMBULATORY_CARE_PROVIDER_SITE_OTHER): Payer: Medicare Other | Admitting: Medical

## 2020-01-21 VITALS — BP 125/68 | HR 106 | Temp 97.0°F | Resp 18 | Ht 73.0 in | Wt 180.0 lb

## 2020-01-21 DIAGNOSIS — G5602 Carpal tunnel syndrome, left upper limb: Secondary | ICD-10-CM | POA: Diagnosis not present

## 2020-01-21 MED ORDER — IBUPROFEN 600 MG PO TABS: 600.0000 mg | ORAL_TABLET | Freq: Three times a day (TID) | ORAL | 0 refills | Status: DC | PRN

## 2020-01-21 NOTE — Progress Notes (Signed)
   Subjective:    Patient ID: Joseph [name], male    DOB: 08-25-1976, 44 y.o.   MRN: [id number]  HPI  Pt in for evaluation.  Pt states he had some recent forearm pain and finger pain. Happened on both sides recently. He states when playing the guitar it felt the worse. He states about 4 months he first noticed. He thought maybe had to do with vaping. But he stopped vaping 4 months ago and still has symptoms. He states would happened very occasional and rare. Pt has been playing guitar before and never had issues/symptoms.  3 days ago is the last time felt symptoms. Only had symptoms while he was applying pressure/ playing cords.  Pt is rt handed.  Left side worse when playing guitar  Other repetitive movements never cause symptoms. No neck pain. No weakness reported.      Review of Systems     Objective:   Physical Exam  General- No acute distress. Pleasant patient. Neck- Full range of motion, no jvd Lungs- Clear, even and unlabored. Heart- regular rate and rhythm. Neurologic- CNII- XII grossly intact.  Left forearm and rt forearm- no swelling, no warmth and no tenderness. Left wrist- no pain on palpationor movement. Rt wrist-no pain on palpationor movement. Sharp and dull discrimination intact all digits. Normal movement. + phalens signs faint on both sides.      Assessment & Plan:  You have some signs/symptoms suspicious for carpel tunnel syndrome. I want you to use wrist cock up splint on left side for next 2 weeks and use ibuprofen over the counter. Rx advisement given.   In 2 weeks will see if you can play guitar without recurrent symptoms.   Mackie Pai, PA-C   Time spent with patient today was 25  minutes which consisted of chart review, discussing diagnosis, work up treatment and documentation.

## 2020-01-21 NOTE — Addendum Note (Signed)
Addended by: Anabel Halon on: 01/21/2020 01:44 PM   Modules accepted: Orders

## 2020-01-21 NOTE — Patient Instructions (Addendum)
You have some signs/symptoms suspicious for carpel tunnel syndrome. I want you to use wrist cock up splint on left side for next 2 weeks and use ibuprofen over the counter. Rx advisement given.   In 2 weeks will see if you can play guitar without recurrent symptoms.    Carpal Tunnel Syndrome  Carpal tunnel syndrome is a condition that causes pain in your hand and arm. The carpal tunnel is a narrow area that is on the palm side of your wrist. Repeated wrist motion or certain diseases may cause swelling in the tunnel. This swelling can pinch the main nerve in the wrist (median nerve). What are the causes? This condition may be caused by:  Repeated wrist motions.  Wrist injuries.  Arthritis.  A sac of fluid (cyst) or abnormal growth (tumor) in the carpal tunnel.  Fluid buildup during pregnancy. Sometimes the cause is not known. What increases the risk? The following factors may make you more likely to develop this condition:  Having a job in which you move your wrist in the same way many times. This includes jobs like being a Software engineer or a Scientist, water quality.  Being a woman.  Having other health conditions, such as: ? Diabetes. ? Obesity. ? A thyroid gland that is not active enough (hypothyroidism). ? Kidney failure. What are the signs or symptoms? Symptoms of this condition include:  A tingling feeling in your fingers.  Tingling or a loss of feeling (numbness) in your hand.  Pain in your entire arm. This pain may get worse when you bend your wrist and elbow for a long time.  Pain in your wrist that goes up your arm to your shoulder.  Pain that goes down into your palm or fingers.  A weak feeling in your hands. You may find it hard to grab and hold items. You may feel worse at night. How is this diagnosed? This condition is diagnosed with a medical history and physical exam. You may also have tests, such as:  Electromyogram (EMG). This test checks the signals that the nerves send  to the muscles.  Nerve conduction study. This test checks how well signals pass through your nerves.  Imaging tests, such as X-rays, ultrasound, and MRI. These tests check for what might be the cause of your condition. How is this treated? This condition may be treated with:  Lifestyle changes. You will be asked to stop or change the activity that caused your problem.  Doing exercise and activities that make bones and muscles stronger (physical therapy).  Learning how to use your hand again (occupational therapy).  Medicines for pain and swelling (inflammation). You may have injections in your wrist.  A wrist splint.  Surgery. Follow these instructions at home: If you have a splint:  Wear the splint as told by your doctor. Remove it only as told by your doctor.  Loosen the splint if your fingers: ? Tingle. ? Lose feeling (become numb). ? Turn cold and blue.  Keep the splint clean.  If the splint is not waterproof: ? Do not let it get wet. ? Cover it with a watertight covering when you take a bath or a shower. Managing pain, stiffness, and swelling   If told, put ice on the painful area: ? If you have a removable splint, remove it as told by your doctor. ? Put ice in a plastic bag. ? Place a towel between your skin and the bag. ? Leave the ice on for 20 minutes, 2-3 times  per day. General instructions  Take over-the-counter and prescription medicines only as told by your doctor.  Rest your wrist from any activity that may cause pain. If needed, talk with your boss at work about changes that can help your wrist heal.  Do any exercises as told by your doctor, physical therapist, or occupational therapist.  Keep all follow-up visits as told by your doctor. This is important. Contact a doctor if:  You have new symptoms.  Medicine does not help your pain.  Your symptoms get worse. Get help right away if:  You have very bad numbness or tingling in your wrist or  hand. Summary  Carpal tunnel syndrome is a condition that causes pain in your hand and arm.  It is often caused by repeated wrist motions.  Lifestyle changes and medicines are used to treat this problem. Surgery may help in very bad cases.  Follow your doctor's instructions about wearing a splint, resting your wrist, keeping follow-up visits, and calling for help. This information is not intended to replace advice given to you by your health care provider. Make sure you discuss any questions you have with your health care provider. Document Revised: 01/18/2018 Document Reviewed: 01/18/2018 Elsevier Patient Education  [place].

## 2020-01-31 ENCOUNTER — Other Ambulatory Visit: Payer: Self-pay | Admitting: Medical

## 2020-02-02 ENCOUNTER — Ambulatory Visit: Payer: Medicare Other | Admitting: Family Medicine

## 2020-02-09 ENCOUNTER — Other Ambulatory Visit: Payer: Self-pay | Admitting: Family Medicine

## 2020-02-10 ENCOUNTER — Ambulatory Visit (INDEPENDENT_AMBULATORY_CARE_PROVIDER_SITE_OTHER): Payer: Medicare Other | Admitting: Family Medicine

## 2020-02-10 ENCOUNTER — Other Ambulatory Visit: Payer: Self-pay

## 2020-02-10 VITALS — BP 122/66 | HR 78 | Temp 98.5°F | Resp 12 | Ht 73.0 in | Wt 291.4 lb

## 2020-02-10 DIAGNOSIS — D509 Iron deficiency anemia, unspecified: Secondary | ICD-10-CM

## 2020-02-10 DIAGNOSIS — E785 Hyperlipidemia, unspecified: Secondary | ICD-10-CM

## 2020-02-10 DIAGNOSIS — Z1211 Encounter for screening for malignant neoplasm of colon: Secondary | ICD-10-CM | POA: Diagnosis not present

## 2020-02-10 DIAGNOSIS — K5900 Constipation, unspecified: Secondary | ICD-10-CM | POA: Diagnosis not present

## 2020-02-10 DIAGNOSIS — Z789 Other specified health status: Secondary | ICD-10-CM

## 2020-02-10 DIAGNOSIS — E119 Type 2 diabetes mellitus without complications: Secondary | ICD-10-CM

## 2020-02-10 DIAGNOSIS — Z87891 Personal history of nicotine dependence: Secondary | ICD-10-CM

## 2020-02-10 DIAGNOSIS — R748 Abnormal levels of other serum enzymes: Secondary | ICD-10-CM | POA: Diagnosis not present

## 2020-02-10 DIAGNOSIS — R519 Headache, unspecified: Secondary | ICD-10-CM | POA: Diagnosis not present

## 2020-02-10 DIAGNOSIS — F259 Schizoaffective disorder, unspecified: Secondary | ICD-10-CM | POA: Diagnosis not present

## 2020-02-10 DIAGNOSIS — G4733 Obstructive sleep apnea (adult) (pediatric): Secondary | ICD-10-CM

## 2020-02-10 DIAGNOSIS — K921 Melena: Secondary | ICD-10-CM | POA: Diagnosis not present

## 2020-02-10 LAB — COMPREHENSIVE METABOLIC PANEL
ALT: 27 U/L (ref 0–53)
AST: 30 U/L (ref 0–37)
Albumin: 4.1 g/dL (ref 3.5–5.2)
Alkaline Phosphatase: 47 U/L (ref 39–117)
BUN: 14 mg/dL (ref 6–23)
CO2: 28 mEq/L (ref 19–32)
Calcium: 8.9 mg/dL (ref 8.4–10.5)
Chloride: 95 mEq/L — ABNORMAL LOW (ref 96–112)
Creatinine, Ser: 1.02 mg/dL (ref 0.40–1.50)
GFR: 78.62 mL/min (ref >60.00)
Glucose, Bld: 176 mg/dL — ABNORMAL HIGH (ref 70–99)
Potassium: 4.1 mEq/L (ref 3.5–5.1)
Sodium: 132 mEq/L — ABNORMAL LOW (ref 135–145)
Total Bilirubin: 0.3 mg/dL (ref 0.2–1.2)
Total Protein: 6.2 g/dL (ref 6.0–8.3)

## 2020-02-10 LAB — CBC WITH DIFFERENTIAL/PLATELET
Basophils Absolute: 0 10*3/uL (ref 0.0–0.1)
Basophils Relative: 0.4 % (ref 0.0–3.0)
Eosinophils Absolute: 0.2 10*3/uL (ref 0.0–0.7)
Eosinophils Relative: 3.3 % (ref 0.0–5.0)
HCT: 36.6 % — ABNORMAL LOW (ref 39.0–52.0)
Hemoglobin: 12.7 g/dL — ABNORMAL LOW (ref 13.0–17.0)
Lymphocytes Relative: 32.9 % (ref 12.0–46.0)
Lymphs Abs: 2.1 10*3/uL (ref 0.7–4.0)
MCHC: 34.8 g/dL (ref 30.0–36.0)
MCV: 96.5 fl (ref 78.0–100.0)
Monocytes Absolute: 0.7 10*3/uL (ref 0.1–1.0)
Monocytes Relative: 10.2 % (ref 3.0–12.0)
Neutro Abs: 3.4 10*3/uL (ref 1.4–7.7)
Neutrophils Relative %: 53.2 % (ref 43.0–77.0)
Platelets: 194 10*3/uL (ref 150.0–400.0)
RBC: 3.79 Mil/uL — ABNORMAL LOW (ref 4.22–5.81)
RDW: 12.8 % (ref 11.5–15.5)
WBC: 6.4 10*3/uL (ref 4.0–10.5)

## 2020-02-10 LAB — LIPID PANEL
Cholesterol: 201 mg/dL — ABNORMAL HIGH (ref 0–200)
HDL: 46.1 mg/dL (ref >39.00)
LDL Cholesterol: 115 mg/dL — ABNORMAL HIGH (ref 0–99)
NonHDL: 155.09
Total CHOL/HDL Ratio: 4
Triglycerides: 198 mg/dL — ABNORMAL HIGH (ref 0.0–149.0)
VLDL: 39.6 mg/dL (ref 0.0–40.0)

## 2020-02-10 LAB — HEMOGLOBIN A1C: Hgb A1c MFr Bld: 6.6 % — ABNORMAL HIGH (ref 4.6–6.5)

## 2020-02-10 MED ORDER — FLUTICASONE PROPIONATE 50 MCG/ACT NA SUSP
2.0000 | Freq: Every day | NASAL | 6 refills | Status: DC
Start: 2020-02-10 — End: 2020-02-10

## 2020-02-10 MED ORDER — BENEFIBER PO POWD: 1.0000 | Freq: Two times a day (BID) | ORAL | 5 refills | Status: DC

## 2020-02-10 MED ORDER — FLUTICASONE PROPIONATE 50 MCG/ACT NA SUSP: 2.0000 | Freq: Every day | NASAL | 6 refills | Status: DC

## 2020-02-10 NOTE — Patient Instructions (Signed)
General Headache Without Cause A headache is pain or discomfort that is felt around the head or neck area. There are many causes and types of headaches. In some cases, the cause may not be found. Follow these instructions at home: Watch your condition for any changes. Let your doctor know about them. Take these steps to help with your condition: Managing pain      Take over-the-counter and prescription medicines only as told by your doctor.  Lie down in a dark, quiet room when you have a headache.  If told, put ice on your head and neck area: ? Put ice in a plastic bag. ? Place a towel between your skin and the bag. ? Leave the ice on for 20 minutes, 2-3 times per day.  If told, put heat on the affected area. Use the heat source that your doctor recommends, such as a moist heat pack or a heating pad. ? Place a towel between your skin and the heat source. ? Leave the heat on for 20-30 minutes. ? Remove the heat if your skin turns bright red. This is very important if you are unable to feel pain, heat, or cold. You may have a greater risk of getting burned.  Keep lights dim if bright lights bother you or make your headaches worse. Eating and drinking  Eat meals on a regular schedule.  If you drink alcohol: ? Limit how much you use to:  0-1 drink a day for women.  0-2 drinks a day for men. ? Be aware of how much alcohol is in your drink. In the [place] one drink equals one 12 oz bottle of beer (355 mL), one 5 oz glass of wine (148 mL), or one 1 oz glass of hard liquor (44 mL).  Stop drinking caffeine, or reduce how much caffeine you drink. General instructions   Keep a journal to find out if certain things bring on headaches. For example, write down: ? What you eat and drink. ? How much sleep you get. ? Any change to your diet or medicines.  Get a massage or try other ways to relax.  Limit stress.  Sit up straight. Do not tighten (tense) your muscles.  Do not use any  products that contain nicotine or tobacco. This includes cigarettes, e-cigarettes, and chewing tobacco. If you need help quitting, ask your doctor.  Exercise regularly as told by your doctor.  Get enough sleep. This often means 7-9 hours of sleep each night.  Keep all follow-up visits as told by your doctor. This is important. Contact a doctor if:  Your symptoms are not helped by medicine.  You have a headache that feels different than the other headaches.  You feel sick to your stomach (nauseous) or you throw up (vomit).  You have a fever. Get help right away if:  Your headache gets very bad quickly.  Your headache gets worse after a lot of physical activity.  You keep throwing up.  You have a stiff neck.  You have trouble seeing.  You have trouble speaking.  You have pain in the eye or ear.  Your muscles are weak or you lose muscle control.  You lose your balance or have trouble walking.  You feel like you will pass out (faint) or you pass out.  You are mixed up (confused).  You have a seizure. Summary  A headache is pain or discomfort that is felt around the head or neck area.  There are many causes and   types of headaches. In some cases, the cause may not be found.  Keep a journal to help find out what causes your headaches. Watch your condition for any changes. Let your doctor know about them.  Contact a doctor if you have a headache that is different from usual, or if your headache is not helped by medicine.  Get help right away if your headache gets very bad, you throw up, you have trouble seeing, you lose your balance, or you have a seizure. This information is not intended to replace advice given to you by your health care provider. Make sure you discuss any questions you have with your health care provider. Document Revised: 04/01/2018 Document Reviewed: 04/01/2018 Elsevier Patient Education  2020 Elsevier Inc.  

## 2020-02-10 NOTE — Assessment & Plan Note (Signed)
Wears CPAP well and nightly.

## 2020-02-11 DIAGNOSIS — Z789 Other specified health status: Secondary | ICD-10-CM | POA: Insufficient documentation

## 2020-02-11 LAB — TSH: TSH: 2.02 u[IU]/mL (ref 0.35–4.50)

## 2020-02-11 NOTE — Assessment & Plan Note (Signed)
He had been vaping but has quit since his last visit

## 2020-02-11 NOTE — Progress Notes (Signed)
Subjective:    Patient ID: Joseph [name], male    DOB: May 14, 1976, 44 y.o.   MRN: [id number]  Chief Complaint  Patient presents with  . 6 month follow up    HPI Patient is in today for follow up on chronic medical concerns. No recent febrile illness or hospitalizations. No acute concerns. Well controlled, no changes to meds. Encouraged heart healthy diet such as the DASH diet and exercise as tolerated. He has noted 3 days in a row with morning headaches. In the past this has occurred after a psych med was prescribed or changed.  Past Medical History:  Diagnosis Date  . Anemia    post operative  . Antiphospholipid syndrome ([place]) 08/15/2011  . Anxiety   . Cellulitis    bilateral thighs  . Cerumen impaction 07/13/2013  . 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 Depression   . Diabetes ([place])    no medications   . Elevated BP 10/20/2012  . Enlarged thyroid 03/07/2015  . H/O tobacco use, presenting hazards to health 12/01/2011   Quit October 2014   . Hyperlipidemia   . Lupus anticoagulant positive 08/15/2011  . Medicare annual wellness visit, subsequent 03/19/2016  . Obesity   . Schizophrenia ([place])   . Sleep apnea     Past Surgical History:  Procedure Laterality Date  . eye surgery     b/l laser eye sx 2004  . I & D EXTREMITY     left leg  . LEG SURGERY    . TONSILLECTOMY    . WISDOM TOOTH EXTRACTION      Family History  Problem Relation Age of Onset  . Diverticulosis Father   . Stomach cancer Mother        stomach CA dx at 29  . GER disease Mother   . Colon cancer Neg Hx   . Pancreatic cancer Neg Hx   . Esophageal cancer Neg Hx     Social History   Socioeconomic History  . Marital status: Single    Spouse name: Not on file  . Number of children: 0  . Years of education: Not on file  . Highest education level: Not on file  Occupational History    Employer: [employer]    Comment: [employer]  Tobacco Use  . Smoking status: Former Smoker    Packs/day: 1.00     Years: 1.00    Pack years: 1.00    Types: Cigarettes    Start date: 07/15/2012    Quit date: 06/13/2013    Years since quitting: 6.6  . Smokeless tobacco: Former Systems developer    Types: Chew  Substance and Sexual Activity  . Alcohol use: No    Alcohol/week: 28.0 standard drinks    Types: 28 Cans of beer per week  . Drug use: No  . Sexual activity: Not Currently  Other Topics Concern  . Not on file  Social History Narrative  . Not on file   Social Determinants of Health   Financial Resource Strain:   . Difficulty of Paying Living Expenses:   Food Insecurity:   . Worried About Charity fundraiser in the Last Year:   . Arboriculturist in the Last Year:   Transportation Needs:   . Film/video editor (Medical):   Marland Kitchen Lack of Transportation (Non-Medical):   Physical Activity:   . Days of Exercise per Week:   . Minutes of Exercise per Session:   Stress:   .  Feeling of Stress :   Social Connections:   . Frequency of Communication with Friends and Family:   . Frequency of Social Gatherings with Friends and Family:   . Attends Religious Services:   . Active Member of Clubs or Organizations:   . Attends Archivist Meetings:   Marland Kitchen Marital Status:   Intimate Partner Violence:   . Fear of Current or Ex-Partner:   . Emotionally Abused:   Marland Kitchen Physically Abused:   . Sexually Abused:     Outpatient Medications Prior to Visit  Medication Sig Dispense Refill  . ASPIRIN LOW DOSE 81 MG EC tablet TAKE 1 TABLET EVERY DAY 30 tablet 11  . clonazePAM (KLONOPIN) 1 MG tablet Take 1 mg by mouth 2 (two) times daily. And As needed For anxiety    . divalproex (DEPAKOTE) 500 MG EC tablet 1 in a.m., 2 at bedtime    . DOK 100 MG capsule TAKE 1 CAPSULE BY MOUTH TWICE DAILY 60 capsule 11  . FLUoxetine (PROZAC) 20 MG capsule Take 20 mg by mouth 2 (two) times daily.   0  . ibuprofen (ADVIL) 600 MG tablet TAKE 1 TABLET(600 MG) BY MOUTH EVERY 8 HOURS AS NEEDED 30 tablet 0  . lisinopril (ZESTRIL) 2.5  MG tablet TAKE 1 TABLET BY MOUTH DAILY 30 tablet 11  . loratadine (CLARITIN) 10 MG tablet TAKE 1 TABLET BY MOUTH ONCE DAILY 30 tablet 11  . Multiple Vitamins-Minerals (MULTIVITAMIN WITH MINERALS) tablet TAKE 1 TABLET BY MOUTH EVERY DAY 100 tablet 3  . OLANZapine (ZYPREXA) 20 MG tablet Take 40 mg by mouth at bedtime. May take an extra 20mg  as needed    . Wheat Dextrin (BENEFIBER) POWD Take 1 Dose by mouth daily at 2 PM. Mix in 8 oz of fluids 730 g 5   No facility-administered medications prior to visit.    Allergies  Allergen Reactions  . Haldol [Haloperidol Decanoate]     Painful, HA  . Statins     ? Related to compartment syndrome    Review of Systems  Constitutional: Negative for fever and malaise/fatigue.  HENT: Negative for congestion.   Eyes: Negative for blurred vision.  Respiratory: Negative for shortness of breath.   Cardiovascular: Negative for chest pain, palpitations and leg swelling.  Gastrointestinal: Negative for abdominal pain, blood in stool and nausea.  Genitourinary: Negative for dysuria and frequency.  Musculoskeletal: Negative for falls.  Skin: Negative for rash.  Neurological: Positive for headaches. Negative for dizziness and loss of consciousness.  Endo/Heme/Allergies: Negative for environmental allergies.  Psychiatric/Behavioral: Negative for depression. The patient is not nervous/anxious.        Objective:    Physical Exam Vitals and nursing note reviewed.  Constitutional:      General: He is not in acute distress.    Appearance: He is well-developed.  HENT:     Head: Normocephalic and atraumatic.     Nose: Nose normal.  Eyes:     General:        Right eye: No discharge.        Left eye: No discharge.  Cardiovascular:     Rate and Rhythm: Normal rate and regular rhythm.     Heart sounds: No murmur.  Pulmonary:     Effort: Pulmonary effort is normal.     Breath sounds: Normal breath sounds.  Abdominal:     General: Bowel sounds are normal.      Palpations: Abdomen is soft.     Tenderness: There  is no abdominal tenderness.  Musculoskeletal:     Cervical back: Normal range of motion and neck supple.  Skin:    General: Skin is warm and dry.  Neurological:     Mental Status: He is alert and oriented to person, place, and time.     BP 122/66 (BP Location: Right Arm, Cuff Size: Large)   Pulse 78   Temp 98.5 F (36.9 C) (Temporal)   Resp 12   Ht 6\' 1"  (1.854 m)   Wt 291 lb 6.4 oz (132.2 kg)   SpO2 97%   BMI 38.45 kg/m  Wt Readings from Last 3 Encounters:  02/10/20 291 lb 6.4 oz (132.2 kg)  01/21/20 180 lb (81.6 kg)  09/08/19 268 lb (121.6 kg)    Diabetic Foot Exam - Simple   No data filed     Lab Results  Component Value Date   WBC 6.4 02/10/2020   HGB 12.7 (L) 02/10/2020   HCT 36.6 (L) 02/10/2020   PLT 194.0 02/10/2020   GLUCOSE 176 (H) 02/10/2020   CHOL 201 (H) 02/10/2020   TRIG 198.0 (H) 02/10/2020   HDL 46.10 02/10/2020   LDLCALC 115 (H) 02/10/2020   ALT 27 02/10/2020   AST 30 02/10/2020   NA 132 (L) 02/10/2020   K 4.1 02/10/2020   CL 95 (L) 02/10/2020   CREATININE 1.02 02/10/2020   BUN 14 02/10/2020   CO2 28 02/10/2020   TSH 2.02 02/10/2020   PSA 0.31 08/05/2019   HGBA1C 6.6 (H) 02/10/2020   MICROALBUR 0.50 07/03/2013    Lab Results  Component Value Date   TSH 2.02 02/10/2020   Lab Results  Component Value Date   WBC 6.4 02/10/2020   HGB 12.7 (L) 02/10/2020   HCT 36.6 (L) 02/10/2020   MCV 96.5 02/10/2020   PLT 194.0 02/10/2020   Lab Results  Component Value Date   NA 132 (L) 02/10/2020   K 4.1 02/10/2020   CO2 28 02/10/2020   GLUCOSE 176 (H) 02/10/2020   BUN 14 02/10/2020   CREATININE 1.02 02/10/2020   BILITOT 0.3 02/10/2020   ALKPHOS 47 02/10/2020   AST 30 02/10/2020   ALT 27 02/10/2020   PROT 6.2 02/10/2020   ALBUMIN 4.1 02/10/2020   CALCIUM 8.9 02/10/2020   GFR 78.62 02/10/2020   Lab Results  Component Value Date   CHOL 201 (H) 02/10/2020   Lab Results    Component Value Date   HDL 46.10 02/10/2020   Lab Results  Component Value Date   LDLCALC 115 (H) 02/10/2020   Lab Results  Component Value Date   TRIG 198.0 (H) 02/10/2020   Lab Results  Component Value Date   CHOLHDL 4 02/10/2020   Lab Results  Component Value Date   HGBA1C 6.6 (H) 02/10/2020       Assessment & Plan:   Problem List Items Addressed This Visit    Iron deficiency anemia   Relevant Orders   CBC with Differential/Platelet (Completed)   Schizoaffective disorder ([place])    Follows with psychiatry and he is questioning if some recent headaches could have been triggered by his meds as this has happened in the past he feels well today but he is encouraged to discuss with psych if HA returns      Diabetes mellitus ([place])    hgba1c acceptable, minimize simple carbs. Increase exercise as tolerated. Continue current meds      Relevant Orders   Hemoglobin A1c (Completed)   Comprehensive metabolic panel (  Completed)   Hyperlipidemia, mild - Primary    Encouraged heart healthy diet, increase exercise, avoid trans fats, consider a krill oil cap daily. Statin intolerant      Relevant Orders   Lipid panel (Completed)   TSH (Completed)   OSA (obstructive sleep apnea)    Wears CPAP well and nightly.      H/O tobacco use, presenting hazards to health    He had been vaping but has quit since his last visit      Abnormal liver enzymes   Blood in stool    This has happened a couple of times over the past several months. He had a colonoscopy back in December but his prep was incomplete. He needs to go back after completing a double prep. New referral is placed. Encouraged increased hydration and fiber in diet. Daily probiotics. If bowels not moving can use MOM 2 tbls po in 4 oz of warm prune juice by mouth every 2-3 days. If no results then repeat in 4 hours with  Dulcolax suppository pr, may repeat again in 4 more hours as needed. Seek care if symptoms worsen. Consider  daily Miralax and/or Dulcolax if symptoms persist.       Relevant Orders   Ambulatory referral to Gastroenterology   Statin intolerance    Developed compartment syndrome on statin       Other Visit Diagnoses    Nonintractable headache, unspecified chronicity pattern, unspecified headache type       Relevant Orders   TSH (Completed)   Constipation, unspecified constipation type       Relevant Orders   Ambulatory referral to Gastroenterology   Colon cancer screening       Relevant Orders   Ambulatory referral to Gastroenterology      I am having Joseph [name] maintain his clonazePAM, divalproex, OLANZapine, FLUoxetine, loratadine, lisinopril, Aspirin Low Dose, DOK, multivitamin with minerals, ibuprofen, fluticasone, and Benefiber.  Meds ordered this encounter  Medications  . DISCONTD: fluticasone (FLONASE) 50 MCG/ACT nasal spray    Sig: Place 2 sprays into both nostrils daily.    Dispense:  16 g    Refill:  6  . DISCONTD: Wheat Dextrin (BENEFIBER) POWD    Sig: Take 1 Dose by mouth in the morning and at bedtime. Mix in 8 oz of fluids    Dispense:  730 g    Refill:  5  . fluticasone (FLONASE) 50 MCG/ACT nasal spray    Sig: Place 2 sprays into both nostrils daily.    Dispense:  16 g    Refill:  6  . Wheat Dextrin (BENEFIBER) POWD    Sig: Take 1 Dose by mouth in the morning and at bedtime. Mix in 8 oz of fluids    Dispense:  730 g    Refill:  5     Penni Homans, MD

## 2020-02-11 NOTE — Assessment & Plan Note (Signed)
This has happened a couple of times over the past several months. He had a colonoscopy back in December but his prep was incomplete. He needs to go back after completing a double prep. New referral is placed. Encouraged increased hydration and fiber in diet. Daily probiotics. If bowels not moving can use MOM 2 tbls po in 4 oz of warm prune juice by mouth every 2-3 days. If no results then repeat in 4 hours with  Dulcolax suppository pr, may repeat again in 4 more hours as needed. Seek care if symptoms worsen. Consider daily Miralax and/or Dulcolax if symptoms persist.

## 2020-02-11 NOTE — Assessment & Plan Note (Signed)
hgba1c acceptable, minimize simple carbs. Increase exercise as tolerated. Continue current meds 

## 2020-02-11 NOTE — Assessment & Plan Note (Signed)
Developed compartment syndrome on statin

## 2020-02-11 NOTE — Assessment & Plan Note (Signed)
Follows with psychiatry and he is questioning if some recent headaches could have been triggered by his meds as this has happened in the past he feels well today but he is encouraged to discuss with psych if HA returns

## 2020-02-11 NOTE — Assessment & Plan Note (Addendum)
Encouraged heart healthy diet, increase exercise, avoid trans fats, consider a krill oil cap daily. Statin intolerant

## 2020-02-16 ENCOUNTER — Other Ambulatory Visit: Payer: Self-pay | Admitting: Family Medicine

## 2020-02-16 DIAGNOSIS — E871 Hypo-osmolality and hyponatremia: Secondary | ICD-10-CM

## 2020-02-16 DIAGNOSIS — E785 Hyperlipidemia, unspecified: Secondary | ICD-10-CM

## 2020-02-17 ENCOUNTER — Other Ambulatory Visit: Payer: Self-pay | Admitting: Family Medicine

## 2020-03-10 ENCOUNTER — Encounter: Payer: Self-pay | Admitting: Internal Medicine

## 2020-03-12 ENCOUNTER — Other Ambulatory Visit: Payer: Self-pay | Admitting: Family Medicine

## 2020-03-15 ENCOUNTER — Other Ambulatory Visit: Payer: Self-pay | Admitting: Family Medicine

## 2020-03-23 ENCOUNTER — Other Ambulatory Visit: Payer: Self-pay

## 2020-03-23 ENCOUNTER — Ambulatory Visit (INDEPENDENT_AMBULATORY_CARE_PROVIDER_SITE_OTHER): Payer: Medicare Other | Admitting: Medical

## 2020-03-23 ENCOUNTER — Encounter: Payer: Self-pay | Admitting: Medical

## 2020-03-23 ENCOUNTER — Ambulatory Visit (HOSPITAL_BASED_OUTPATIENT_CLINIC_OR_DEPARTMENT_OTHER)
Admission: RE | Admit: 2020-03-23 | Discharge: 2020-03-23 | Disposition: A | Discharge status: Home / Self Care | Payer: Medicare Other | Source: Ambulatory Visit | Attending: Medical | Admitting: Medical

## 2020-03-23 VITALS — BP 125/62 | HR 83 | Temp 97.0°F | Resp 18 | Ht 74.0 in | Wt 285.5 lb

## 2020-03-23 DIAGNOSIS — M79A29 Nontraumatic compartment syndrome of unspecified lower extremity: Secondary | ICD-10-CM | POA: Diagnosis not present

## 2020-03-23 DIAGNOSIS — M79671 Pain in right foot: Secondary | ICD-10-CM

## 2020-03-23 MED ORDER — ACETAMINOPHEN 325 MG PO TABS
ORAL_TABLET | ORAL | 0 refills | Status: DC
Start: 2020-03-23 — End: 2022-08-04

## 2020-03-23 NOTE — Patient Instructions (Addendum)
For rt foot/heel pain will get xray of rt foot. Recommend Dr. Darrick Grinder full shoe pad to treat possible spur/plantar fascitis. Recommend get pad/cushion for both sides.  Can use can combination of tylenonol  325 mg and low dose ibuprofen 600 mg  for pain every 8 hours as needed for pain.  Hx of compartment syndrome years ago. Will write presciption for new left side brace since brace cracked 4 days ago.   Follow up in 2-3 weeks or as needed.

## 2020-03-23 NOTE — Progress Notes (Signed)
Subjective:    Patient ID: Joseph [name], male    DOB: August 27, 1976, 44 y.o.   MRN: [id number]  HPI  Pt in for some recent rt heel pain.   Pt has left lower ext brace. Hx of left side compartment syndrome. He states came on with light activity. That was several years ago. Pt states brace cracked about one.   Pain present for 3 weeks. He wears brace on rt side as well. He had compartment syndrome on that side as well.   Pt legs are not swollen and has no pain.    Review of Systems  Constitutional: Negative for chills, fatigue and fever.  Respiratory: Negative for chest tightness, shortness of breath and wheezing.   Cardiovascular: Negative for chest pain and palpitations.  Gastrointestinal: Negative for abdominal pain.  Musculoskeletal: Negative for back pain.       See hpi.  Skin: Negative for rash.  Neurological: Negative for dizziness and headaches.  Hematological: Negative for adenopathy. Does not bruise/bleed easily.  Psychiatric/Behavioral: Negative for behavioral problems and decreased concentration. The patient is not nervous/anxious.    Past Medical History:  Diagnosis Date  . Anemia    post operative  . Antiphospholipid syndrome ([place]) 08/15/2011  . Anxiety   . Cellulitis    bilateral thighs  . Cerumen impaction 07/13/2013  . 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[place])   . Depression   . Diabetes ([place])    no medications   . Elevated BP 10/20/2012  . Enlarged thyroid 03/07/2015  . H/O tobacco use, presenting hazards to health 12/01/2011   Quit October 2014   . Hyperlipidemia   . Lupus anticoagulant positive 08/15/2011  . Medicare annual wellness visit, subsequent 03/19/2016  . Obesity   . Schizophrenia ([place])   . Sleep apnea      Social History   Socioeconomic History  . Marital status: Single    Spouse name: Not on file  . Number of children: 0  . Years of education: Not on file  . Highest education level: Not on file  Occupational History    Employer: [employer]    Comment: [employer]  Tobacco Use  . Smoking status: Former Smoker    Packs/day: 1.00    Years: 1.00    Pack years: 1.00    Types: Cigarettes    Start date: 07/15/2012    Quit date: 06/13/2013    Years since quitting: 6.7  . Smokeless tobacco: Former Systems developer    Types: Secondary school teacher  . Vaping Use: Former  Substance and Sexual Activity  . Alcohol use: No    Alcohol/week: 28.0 standard drinks    Types: 28 Cans of beer per week  . Drug use: No  . Sexual activity: Not Currently  Other Topics Concern  . Not on file  Social History Narrative  . Not on file   Social Determinants of Health   Financial Resource Strain:   . Difficulty of Paying Living Expenses:   Food Insecurity:   . Worried About Charity fundraiser in the Last Year:   . Arboriculturist in the Last Year:   Transportation Needs:   . Film/video editor (Medical):   Marland Kitchen Lack of Transportation (Non-Medical):   Physical Activity:   . Days of Exercise per Week:   . Minutes of Exercise per Session:   Stress:   . Feeling of Stress :   Social Connections:   . Frequency of Communication  with Friends and Family:   . Frequency of Social Gatherings with Friends and Family:   . Attends Religious Services:   . Active Member of Clubs or Organizations:   . Attends Archivist Meetings:   Marland Kitchen Marital Status:   Intimate Partner Violence:   . Fear of Current or Ex-Partner:   . Emotionally Abused:   Marland Kitchen Physically Abused:   . Sexually Abused:     Past Surgical History:  Procedure Laterality Date  . eye surgery     b/l laser eye sx 2004  . I & D EXTREMITY     left leg  . LEG SURGERY    . TONSILLECTOMY    . WISDOM TOOTH EXTRACTION      Family History  Problem Relation Age of Onset  . Diverticulosis Father   . Stomach cancer Mother        stomach CA dx at 89  . GER disease Mother   . Colon cancer Neg Hx   . Pancreatic cancer Neg Hx   . Esophageal cancer Neg Hx     Allergies  Allergen  Reactions  . Haldol [Haloperidol Decanoate]     Painful, HA  . Statins     ? Related to compartment syndrome    Current Outpatient Medications on File Prior to Visit  Medication Sig Dispense Refill  . ASPIRIN LOW DOSE 81 MG EC tablet TAKE 1 TABLET EVERY DAY 30 tablet 11  . clonazePAM (KLONOPIN) 1 MG tablet Take 1 mg by mouth 2 (two) times daily. And As needed For anxiety    . divalproex (DEPAKOTE) 500 MG EC tablet 1 in a.m., 2 at bedtime    . DOK 100 MG capsule TAKE ONE CAPSULE BY MOUTH TWICE A DAY. 60 capsule 11  . FLUoxetine (PROZAC) 20 MG capsule Take 20 mg by mouth 2 (two) times daily.   0  . fluticasone (FLONASE) 50 MCG/ACT nasal spray Place 2 sprays into both nostrils daily. 16 g 6  . ibuprofen (ADVIL) 600 MG tablet TAKE 1 TABLET(600 MG) BY MOUTH EVERY 8 HOURS AS NEEDED 30 tablet 0  . lisinopril (ZESTRIL) 2.5 MG tablet TAKE 1 TABLET BY MOUTH DAILY 30 tablet 11  . loratadine (CLARITIN) 10 MG tablet TAKE 1 TABLET BY MOUTH EVERY DAY 30 tablet 11  . Multiple Vitamins-Minerals (MULTIVITAMIN WITH MINERALS) tablet TAKE 1 TABLET BY MOUTH EVERY DAY 100 tablet 3  . OLANZapine (ZYPREXA) 20 MG tablet Take 40 mg by mouth at bedtime. May take an extra 20mg  as needed    . Wheat Dextrin (BENEFIBER) POWD Take 1 Dose by mouth in the morning and at bedtime. Mix in 8 oz of fluids 730 g 5   No current facility-administered medications on file prior to visit.    BP 125/62 (BP Location: Left Arm, Patient Position: Sitting, Cuff Size: Large)   Pulse 83   Temp (!) 97 F (36.1 C) (Temporal)   Resp 18   Ht 6\' 2"  (1.88 m)   Wt 285 lb 8 oz (129.5 kg)   SpO2 96%   BMI 36.66 kg/m       Objective:   Physical Exam  General- No acute distress. Pleasant patient. Neck- Full range of motion, no jvd Lungs- Clear, even and unlabored. Heart- regular rate and rhythm. Neurologic- CNII- XII grossly intact.  Rt foot- heel pain on palpation.(shoe does not have much padding.) Bilateral lower ext- no calf  swelling, warmth or pain on palpation. Negative homans signs.  Assessment & Plan:  For rt foot/heel pain will get xray of rt foot. Recommend Dr. Darrick Grinder full shoe pad to treat possible spur/plantar fascitis. Recommend get pad/cushion for both sides.  Can use can combination of tylenonol  325 mg and low dose ibuprofen 600 mg  for pain every 8 hours as needed for pain.  Hx of compartment syndrome years ago. Will write presciption for new left side brace since brace cracked 4 days ago.   Follow up in 2-3 weeks or as needed.  Time spent with patient today was 30  minutes which consisted of chart review, discussing diagnosis, work up, answering questions  treatment and documentation.

## 2020-03-24 ENCOUNTER — Telehealth: Payer: Self-pay | Admitting: Family Medicine

## 2020-03-24 NOTE — Telephone Encounter (Signed)
[facility] calling to get notes from patient visit yesterday with Charlett Blake. Please call [facility].please advise . Patient saw edward yesterday.

## 2020-03-24 NOTE — Telephone Encounter (Signed)
Caller: Bernabe Call back phone number: [phone]  Patient states he was seeing yesterday by Mackie Pai. Patient was under the impression he was going to be prescribed some antiinflammatory none was called.   Please advise.

## 2020-03-24 NOTE — Telephone Encounter (Signed)
No advised to use ibuprofen which he has already. Use in combination with tylenol which did send to his pharmacy since he needs script for all meds. Did not advise any nsaids as he is already using.

## 2020-03-24 NOTE — Telephone Encounter (Signed)
AVS faxed to [facility]

## 2020-03-24 NOTE — Telephone Encounter (Signed)
Called pt and lvm to return call.  

## 2020-03-25 NOTE — Telephone Encounter (Signed)
Called pt and lvm to return call.  

## 2020-03-31 ENCOUNTER — Other Ambulatory Visit: Payer: Self-pay | Admitting: Family Medicine

## 2020-04-02 ENCOUNTER — Telehealth: Payer: Self-pay | Admitting: Family Medicine

## 2020-04-02 NOTE — Telephone Encounter (Signed)
Spoke with Herby Abraham at [facility] and she just needed to know if ov note stated that patient could not walk. She stated that patient is on a walking regimen daily for 1 and 1/2 hour per day because he is overweight.  Advised I did not see any note in ov about not walking.

## 2020-04-02 NOTE — Telephone Encounter (Signed)
Caller: Herby Abraham [facility]) Call back phone number: [phone]  The patient was seen on 03/23/20 for heel pain Herby Abraham needs to know patient restriction if any.

## 2020-04-05 DIAGNOSIS — I10 Essential (primary) hypertension: Secondary | ICD-10-CM | POA: Diagnosis not present

## 2020-04-05 DIAGNOSIS — E119 Type 2 diabetes mellitus without complications: Secondary | ICD-10-CM | POA: Diagnosis not present

## 2020-04-05 DIAGNOSIS — E663 Overweight: Secondary | ICD-10-CM | POA: Diagnosis not present

## 2020-04-05 DIAGNOSIS — E7800 Pure hypercholesterolemia, unspecified: Secondary | ICD-10-CM | POA: Diagnosis not present

## 2020-04-07 ENCOUNTER — Other Ambulatory Visit: Payer: Self-pay

## 2020-04-07 ENCOUNTER — Telehealth: Payer: Self-pay | Admitting: Medical

## 2020-04-07 ENCOUNTER — Ambulatory Visit (INDEPENDENT_AMBULATORY_CARE_PROVIDER_SITE_OTHER): Payer: Medicare Other | Admitting: Medical

## 2020-04-07 VITALS — BP 118/62 | HR 89 | Resp 17 | Ht 74.0 in | Wt 289.8 lb

## 2020-04-07 DIAGNOSIS — M79671 Pain in right foot: Secondary | ICD-10-CM

## 2020-04-07 NOTE — Patient Instructions (Signed)
Sorry to hear recent treatment did not resolve your heel pain.  Recommend continue heel pad and otc meds advised but will also go ahead and put in referral to podiatrist. If no call by one week please update [place] so I can follow up on the referral.  Follow up as regularly scheduled with pcp or as needed

## 2020-04-07 NOTE — Progress Notes (Signed)
Subjective:    Patient ID: Joseph [name], male    DOB: 07-Jan-1976, 44 y.o.   MRN: [id number]  HPI  Pt still has foot pain/heel pain on rt side. Pt did get heel pad and got otc meds advised. Despite this has no pain. Xray done and did show spur on bottom per report.  Plan was to refer to podiatrist if pain persisted.    Review of Systems  Constitutional: Negative for chills and fatigue.  Respiratory: Negative for cough, chest tightness and shortness of breath.   Cardiovascular: Negative for chest pain and palpitations.  Musculoskeletal:       Foot pain  Skin: Negative for rash.   Past Medical History:  Diagnosis Date  . Anemia    post operative  . Antiphospholipid syndrome ([place]) 08/15/2011  . Anxiety   . Cellulitis    bilateral thighs  . Cerumen impaction 07/13/2013  . 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 Depression   . Diabetes ([place])    no medications   . Elevated BP 10/20/2012  . Enlarged thyroid 03/07/2015  . H/O tobacco use, presenting hazards to health 12/01/2011   Quit October 2014   . Hyperlipidemia   . Lupus anticoagulant positive 08/15/2011  . Medicare annual wellness visit, subsequent 03/19/2016  . Obesity   . Schizophrenia ([place])   . Sleep apnea      Social History   Socioeconomic History  . Marital status: Single    Spouse name: Not on file  . Number of children: 0  . Years of education: Not on file  . Highest education level: Not on file  Occupational History    Employer: [employer]    Comment: [employer]  Tobacco Use  . Smoking status: Former Smoker    Packs/day: 1.00    Years: 1.00    Pack years: 1.00    Types: Cigarettes    Start date: 07/15/2012    Quit date: 06/13/2013    Years since quitting: 6.8  . Smokeless tobacco: Former Systems developer    Types: Secondary school teacher  . Vaping Use: Former  Substance and Sexual Activity  . Alcohol use: No    Alcohol/week: 28.0 standard drinks    Types: 28 Cans of beer per week  . Drug use: No  . Sexual  activity: Not Currently  Other Topics Concern  . Not on file  Social History Narrative  . Not on file   Social Determinants of Health   Financial Resource Strain:   . Difficulty of Paying Living Expenses:   Food Insecurity:   . Worried About Charity fundraiser in the Last Year:   . Arboriculturist in the Last Year:   Transportation Needs:   . Film/video editor (Medical):   Marland Kitchen Lack of Transportation (Non-Medical):   Physical Activity:   . Days of Exercise per Week:   . Minutes of Exercise per Session:   Stress:   . Feeling of Stress :   Social Connections:   . Frequency of Communication with Friends and Family:   . Frequency of Social Gatherings with Friends and Family:   . Attends Religious Services:   . Active Member of Clubs or Organizations:   . Attends Archivist Meetings:   Marland Kitchen Marital Status:   Intimate Partner Violence:   . Fear of Current or Ex-Partner:   . Emotionally Abused:   Marland Kitchen Physically Abused:   . Sexually Abused:  Past Surgical History:  Procedure Laterality Date  . eye surgery     b/l laser eye sx 2004  . I & D EXTREMITY     left leg  . LEG SURGERY    . TONSILLECTOMY    . WISDOM TOOTH EXTRACTION      Family History  Problem Relation Age of Onset  . Diverticulosis Father   . Stomach cancer Mother        stomach CA dx at 54  . GER disease Mother   . Colon cancer Neg Hx   . Pancreatic cancer Neg Hx   . Esophageal cancer Neg Hx     Allergies  Allergen Reactions  . Haldol [Haloperidol Decanoate]     Painful, HA  . Statins     ? Related to compartment syndrome    Current Outpatient Medications on File Prior to Visit  Medication Sig Dispense Refill  . acetaminophen (TYLENOL) 325 MG tablet 1 tab po every 8 hours as needed for pain 30 tablet 0  . ASPIRIN LOW DOSE 81 MG EC tablet TAKE 1 TABLET EVERY DAY 30 tablet 11  . clonazePAM (KLONOPIN) 1 MG tablet Take 1 mg by mouth 2 (two) times daily. And As needed For anxiety    .  divalproex (DEPAKOTE) 500 MG EC tablet 1 in a.m., 2 at bedtime    . docusate sodium (DOK) 100 MG capsule Take 1 capsule (100 mg total) by mouth 2 (two) times daily. 60 capsule 12  . FLUoxetine (PROZAC) 20 MG capsule Take 20 mg by mouth 2 (two) times daily.   0  . fluticasone (FLONASE) 50 MCG/ACT nasal spray Place 2 sprays into both nostrils daily. 16 g 6  . ibuprofen (ADVIL) 600 MG tablet TAKE 1 TABLET(600 MG) BY MOUTH EVERY 8 HOURS AS NEEDED 30 tablet 0  . lisinopril (ZESTRIL) 2.5 MG tablet TAKE 1 TABLET BY MOUTH DAILY 30 tablet 11  . loratadine (CLARITIN) 10 MG tablet TAKE 1 TABLET BY MOUTH EVERY DAY 30 tablet 11  . Multiple Vitamins-Minerals (MULTIVITAMIN WITH MINERALS) tablet TAKE 1 TABLET BY MOUTH EVERY DAY 100 tablet 3  . OLANZapine (ZYPREXA) 20 MG tablet Take 40 mg by mouth at bedtime. May take an extra 20mg  as needed    . Wheat Dextrin (BENEFIBER) POWD Take 1 Dose by mouth in the morning and at bedtime. Mix in 8 oz of fluids 730 g 5   No current facility-administered medications on file prior to visit.    BP 118/62 (BP Location: Left Arm, Patient Position: Sitting, Cuff Size: Large)   Pulse 89   Resp 17   Ht 6\' 2"  (1.88 m)   Wt 289 lb 12.8 oz (131.5 kg)   SpO2 94%   BMI 37.21 kg/m       Objective:   Physical Exam  General- No acute distress. Pleasant patient. Lungs- Clear, even and unlabored. Heart- regular rate and rhythm. Neurologic- CNII- XII grossly intact. Rt foot- mild pain bottom aspect on palpation.       Assessment & Plan:  Sorry to hear recent treatment did not resolve your heel pain.  Recommend continue heel pad and otc meds advised but will also go ahead and put in referral to podiatrist. If no call by one week please update [place] so I can follow up on the referral.  Follow up as regularly scheduled with pcp or as needed  Time spent with patient today was 14  minutes which consisted of chart review, discussing diagnosis,  work up ,treatment and  documentation.

## 2020-04-07 NOTE — Telephone Encounter (Signed)
Pt referred to podiatry. Pt called back number. [phone].

## 2020-04-21 ENCOUNTER — Other Ambulatory Visit: Payer: Self-pay

## 2020-04-21 ENCOUNTER — Ambulatory Visit: Payer: Medicare Other

## 2020-04-21 ENCOUNTER — Encounter: Payer: Self-pay | Admitting: Podiatry

## 2020-04-21 ENCOUNTER — Ambulatory Visit (INDEPENDENT_AMBULATORY_CARE_PROVIDER_SITE_OTHER): Payer: Medicare Other | Admitting: Podiatry

## 2020-04-21 DIAGNOSIS — M21371 Foot drop, right foot: Secondary | ICD-10-CM

## 2020-04-21 DIAGNOSIS — M21372 Foot drop, left foot: Secondary | ICD-10-CM | POA: Diagnosis not present

## 2020-04-21 DIAGNOSIS — M216X1 Other acquired deformities of right foot: Secondary | ICD-10-CM | POA: Diagnosis not present

## 2020-04-21 DIAGNOSIS — M7661 Achilles tendinitis, right leg: Secondary | ICD-10-CM | POA: Diagnosis not present

## 2020-04-21 DIAGNOSIS — M7750 Other enthesopathy of unspecified foot: Secondary | ICD-10-CM

## 2020-04-21 MED ORDER — IBUPROFEN 800 MG PO TABS: 800.0000 mg | ORAL_TABLET | Freq: Three times a day (TID) | ORAL | 0 refills | Status: DC | PRN

## 2020-04-21 MED ORDER — DICLOFENAC SODIUM 1 % EX GEL: 4.0000 g | Freq: Four times a day (QID) | CUTANEOUS | 2 refills | Status: DC

## 2020-04-21 NOTE — Progress Notes (Signed)
°  Subjective:  Patient ID: Joseph [name], male    DOB: 1976-04-21,  MRN: [id number]  Chief Complaint  Patient presents with   Foot Pain    right heel/ankle pain, Xrays done 03/23/20 by PCP and they could not see anything wrong in the ankle    44 y.o. male presents with the above complaint. History confirmed with patient. He lives in a group home. He saw his primary doctor in late June for the same issue. Is been going on for about 2 months. Pain is all in the back of the heel. 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  Objective:  Physical Exam: warm, good capillary refill, no trophic changes or ulcerative lesions, normal DP and PT pulses and normal sensory exam. 0-5 muscle strength in dorsiflexion bilateral ankle. Trace contracture of the EHL noted.   Right Foot: Sharp pain on palpation to the posterior heel. Gastrosoleus equinus is present.   Radiographs: X-ray right heel 03/23/2020: Large posterior calcaneal enthesophyte, this potentially could have fractured versus cracked, although unclear if this is a separate calcification within the tendinous body Assessment:   1. Tendonitis, Achilles, right   2. Foot drop, bilateral   3. Acquired equinus deformity of right foot      Plan:  Patient was evaluated and treated and all questions answered.   -XR reviewed with patient -Educated on stretching and icing of the affected limb. -Referral placed to physical therapy.  -Prescription for ibuprofen 800 mg as needed every 6 hours, and Voltaren gel 4 g to posterior heel and tendon 4 times daily as needed sent to pharmacy -felt heel lift placed in shoe to offload tendon -discussed etiology and treatment options for insertional Achilles tendinitis, how this relates to the calcification of the tendon, and also how it relates to his bilateral foot drop. Reviewed nonsurgical and surgical treatment options with  him. Hopefully this will improve without surgery.  Lanae Crumbly, DPM 04/21/2020     Return in about 6 weeks (around 06/02/2020) for heel pain right.

## 2020-04-21 NOTE — Addendum Note (Signed)
Addended by: Wardell Heath on: 04/21/2020 03:01 PM   Modules accepted: Orders

## 2020-04-21 NOTE — Patient Instructions (Addendum)
Achilles Tendinitis  with Rehab Achilles tendinitis is a disorder of the Achilles tendon. The Achilles tendon connects the large calf muscles (Gastrocnemius and Soleus) to the heel bone (calcaneus). This tendon is sometimes called the heel cord. It is important for pushing-off and standing on your toes and is important for walking, running, or jumping. Tendinitis is often caused by overuse and repetitive microtrauma. SYMPTOMS  Pain, tenderness, swelling, warmth, and redness may occur over the Achilles tendon even at rest.  Pain with pushing off, or flexing or extending the ankle.  Pain that is worsened after or during activity. CAUSES   Overuse sometimes seen with rapid increase in exercise programs or in sports requiring running and jumping.  Poor physical conditioning (strength and flexibility or endurance).  Running sports, especially training running down hills.  Inadequate warm-up before practice or play or failure to stretch before participation.  Injury to the tendon. PREVENTION   Warm up and stretch before practice or competition.  Allow time for adequate rest and recovery between practices and competition.  Keep up conditioning.  Keep up ankle and leg flexibility.  Improve or keep muscle strength and endurance.  Improve cardiovascular fitness.  Use proper technique.  Use proper equipment (shoes, skates).  To help prevent recurrence, taping, protective strapping, or an adhesive bandage may be recommended for several weeks after healing is complete. PROGNOSIS   Recovery may take weeks to several months to heal.  Longer recovery is expected if symptoms have been prolonged.  Recovery is usually quicker if the inflammation is due to a direct blow as compared with overuse or sudden strain. RELATED COMPLICATIONS   Healing time will be prolonged if the condition is not correctly treated. The injury must be given plenty of time to heal.  Symptoms can reoccur if  activity is resumed too soon.  Untreated, tendinitis may increase the risk of tendon rupture requiring additional time for recovery and possibly surgery. TREATMENT   The first treatment consists of rest anti-inflammatory medication, and ice to relieve the pain.  Stretching and strengthening exercises after resolution of pain will likely help reduce the risk of recurrence. Referral to a physical therapist or athletic trainer for further evaluation and treatment may be helpful.  A walking boot or cast may be recommended to rest the Achilles tendon. This can help break the cycle of inflammation and microtrauma.  Arch supports (orthotics) may be prescribed or recommended by your caregiver as an adjunct to therapy and rest.  Surgery to remove the inflamed tendon lining or degenerated tendon tissue is rarely necessary and has shown less than predictable results. MEDICATION   Nonsteroidal anti-inflammatory medications, such as aspirin and ibuprofen, may be used for pain and inflammation relief. Do not take within 7 days before surgery. Take these as directed by your caregiver. Contact your caregiver immediately if any bleeding, stomach upset, or signs of allergic reaction occur. Other minor pain relievers, such as acetaminophen, may also be used.  Pain relievers may be prescribed as necessary by your caregiver. Do not take prescription pain medication for longer than 4 to 7 days. Use only as directed and only as much as you need.  Cortisone injections are rarely indicated. Cortisone injections may weaken tendons and predispose to rupture. It is better to give the condition more time to heal than to use them. HEAT AND COLD  Cold is used to relieve pain and reduce inflammation for acute and chronic Achilles tendinitis. Cold should be applied for 10 to 15  minutes every 2 to 3 hours for inflammation and pain and immediately after any activity that aggravates your symptoms. Use ice packs or an ice  massage.  Heat may be used before performing stretching and strengthening activities prescribed by your caregiver. Use a heat pack or a warm soak. SEEK MEDICAL CARE IF:  Symptoms get worse or do not improve in 2 weeks despite treatment.  New, unexplained symptoms develop. Drugs used in treatment may produce side effects.  EXERCISES:  RANGE OF MOTION (ROM) AND STRETCHING EXERCISES - Achilles Tendinitis  These exercises may help you when beginning to rehabilitate your injury. Your symptoms may resolve with or without further involvement from your physician, physical therapist or athletic trainer. While completing these exercises, remember:   Restoring tissue flexibility helps normal motion to return to the joints. This allows healthier, less painful movement and activity.  An effective stretch should be held for at least 30 seconds.  A stretch should never be painful. You should only feel a gentle lengthening or release in the stretched tissue.  STRETCH  Gastroc, Standing   Place hands on wall.  Extend right / left leg, keeping the front knee somewhat bent.  Slightly point your toes inward on your back foot.  Keeping your right / left heel on the floor and your knee straight, shift your weight toward the wall, not allowing your back to arch.  You should feel a gentle stretch in the right / left calf. Hold this position for 10 seconds. Repeat 3 times. Complete this stretch 2 times per day.  STRETCH  Soleus, Standing   Place hands on wall.  Extend right / left leg, keeping the other knee somewhat bent.  Slightly point your toes inward on your back foot.  Keep your right / left heel on the floor, bend your back knee, and slightly shift your weight over the back leg so that you feel a gentle stretch deep in your back calf.  Hold this position for 10 seconds. Repeat 3 times. Complete this stretch 2 times per day.  STRETCH  Gastrocsoleus, Standing  Note: This exercise can place  a lot of stress on your foot and ankle. Please complete this exercise only if specifically instructed by your caregiver.   Place the ball of your right / left foot on a step, keeping your other foot firmly on the same step.  Hold on to the wall or a rail for balance.  Slowly lift your other foot, allowing your body weight to press your heel down over the edge of the step.  You should feel a stretch in your right / left calf.  Hold this position for 10 seconds.  Repeat this exercise with a slight bend in your knee. Repeat 3 times. Complete this stretch 2 times per day.   STRENGTHENING EXERCISES - Achilles Tendinitis These exercises may help you when beginning to rehabilitate your injury. They may resolve your symptoms with or without further involvement from your physician, physical therapist or athletic trainer. While completing these exercises, remember:   Muscles can gain both the endurance and the strength needed for everyday activities through controlled exercises.  Complete these exercises as instructed by your physician, physical therapist or athletic trainer. Progress the resistance and repetitions only as guided.  You may experience muscle soreness or fatigue, but the pain or discomfort you are trying to eliminate should never worsen during these exercises. If this pain does worsen, stop and make certain you are following the directions exactly.  If the pain is still present after adjustments, discontinue the exercise until you can discuss the trouble with your clinician.  STRENGTH - Plantar-flexors   Sit with your right / left leg extended. Holding onto both ends of a rubber exercise band/tubing, loop it around the ball of your foot. Keep a slight tension in the band.  Slowly push your toes away from you, pointing them downward.  Hold this position for 10 seconds. Return slowly, controlling the tension in the band/tubing. Repeat 3 times. Complete this exercise 2 times per day.     STRENGTH - Plantar-flexors   Stand with your feet shoulder width apart. Steady yourself with a wall or table using as little support as needed.  Keeping your weight evenly spread over the width of your feet, rise up on your toes.*  Hold this position for 10 seconds. Repeat 3 times. Complete this exercise 2 times per day.  *If this is too easy, shift your weight toward your right / left leg until you feel challenged. Ultimately, you may be asked to do this exercise with your right / left foot only.  STRENGTH  Plantar-flexors, Eccentric  Note: This exercise can place a lot of stress on your foot and ankle. Please complete this exercise only if specifically instructed by your caregiver.   Place the balls of your feet on a step. With your hands, use only enough support from a wall or rail to keep your balance.  Keep your knees straight and rise up on your toes.  Slowly shift your weight entirely to your right / left toes and pick up your opposite foot. Gently and with controlled movement, lower your weight through your right / left foot so that your heel drops below the level of the step. You will feel a slight stretch in the back of your calf at the end position.  Use the healthy leg to help rise up onto the balls of both feet, then lower weight only on the right / left leg again. Build up to 15 repetitions. Then progress to 3 consecutive sets of 15 repetitions.*  After completing the above exercise, complete the same exercise with a slight knee bend (about 30 degrees). Again, build up to 15 repetitions. Then progress to 3 consecutive sets of 15 repetitions.* Perform this exercise 2 times per day.  *When you easily complete 3 sets of 15, your physician, physical therapist or athletic trainer may advise you to add resistance by wearing a backpack filled with additional weight.  STRENGTH - Plantar Flexors, Seated   Sit on a chair that allows your feet to rest flat on the ground. If  necessary, sit at the edge of the chair.  Keeping your toes firmly on the ground, lift your right / left heel as far as you can without increasing any discomfort in your ankle. Repeat 3 times. Complete this exercise 2 times a day.    Rosen's Emergency Medicine: Concepts and Clinical Practice (9th ed., pp. 4097-3532). [place], [place]: [place]. Retrieved from https://www.clinicalkey.com/#!/content/book/3-s2.0-[email or web address]?scrollTo=%23hl0000251">  Achilles Tendinitis  Achilles tendinitis is inflammation of the tough, cord-like band that attaches the lower leg muscles to the heel bone (Achilles tendon). This is usually caused by overusing the tendon and the ankle joint. Achilles tendinitis usually gets better over time with treatment and caring for yourself at home. It can take weeks or months to heal completely. What are the causes? This condition may be caused by:  A sudden increase in exercise or activity, such as  running.  Doing the same exercises or activities, such as jumping, over and over.  Not warming up calf muscles before exercising.  Exercising in shoes that are worn out or not made for exercise.  Having arthritis or a bone growth (spur) on the back of the heel bone. This can rub against the tendon and hurt it.  Age-related wear and tear. Tendons become less flexible with age and are more likely to be injured. What are the signs or symptoms? Common symptoms of this condition include:  Pain in the Achilles tendon or in the back of the leg, just above the heel. The pain usually gets worse with exercise.  Stiffness or soreness in the back of the leg, especially in the morning.  Swelling of the skin over the Achilles tendon.  Thickening of the tendon.  Trouble standing on tiptoe. How is this diagnosed? This condition is diagnosed based on your symptoms and a physical exam. You may have tests, including:  X-rays.  MRI. How is this treated? The goal of  treatment is to relieve symptoms and help your injury heal. Treatment may include:  Decreasing or stopping activities that caused the tendinitis. This may mean switching to low-impact exercises like biking or swimming.  Icing the injured area.  Doing physical therapy, including strengthening and stretching exercises.  Taking NSAIDs, such as ibuprofen, to help relieve pain and swelling.  Using supportive shoes, wraps, heel lifts, or a walking boot (air cast).  Having surgery. This may be done if your symptoms do not improve after other treatments.  Using high-energy shock wave impulses to stimulate the healing process (extracorporeal shock wave therapy). This is rare.  Having an injection of medicines that help relieve inflammation (corticosteroids). This is rare. Follow these instructions at home: If you have an air cast:  Wear the air cast as told by your health care provider. Remove it only as told by your health care provider.  Loosen it if your toes tingle, become numb, or turn cold and blue.  Keep it clean.  If the air cast is not waterproof: ? Do not let it get wet. ? Cover it with a watertight covering when you take a bath or shower. Managing pain, stiffness, and swelling   If directed, put ice on the injured area. To do this: ? If you have a removable air cast, remove it as told by your health care provider. ? Put ice in a plastic bag. ? Place a towel between your skin and the bag. ? Leave the ice on for 20 minutes, 2-3 times a day.  Move your toes often to reduce stiffness and swelling.  Raise (elevate) your foot above the level of your heart while you are sitting or lying down. Activity  Gradually return to your normal activities as told by your health care provider. Ask your health care provider what activities are safe for you.  Do not do activities that cause pain.  Consider doing low-impact exercises, like cycling or swimming.  Ask your health care  provider when it is safe to drive if you have an air cast on your foot.  If physical therapy was prescribed, do exercises as told by your health care provider or physical therapist. General instructions  If directed, wrap your foot with an elastic bandage or other wrap. This can help to keep your tendon from moving too much while it heals. Your health care provider will show you how to wrap your foot correctly.  Wear supportive shoes  or heel lifts only as told by your health care provider.  Take over-the-counter and prescription medicines only as told by your health care provider.  Keep all follow-up visits as told by your health care provider. This is important. Contact a health care provider if you:  Have symptoms that get worse.  Have pain that does not get better with medicine.  Develop new, unexplained symptoms.  Develop warmth and swelling in your foot.  Have a fever. Get help right away if you:  Have a sudden popping sound or sensation in your Achilles tendon followed by severe pain.  Cannot move your toes or foot.  Cannot put any weight on your foot.  Your foot or toes become numb and look white or blue even after loosening your bandage or air cast. Summary  Achilles tendinitis is inflammation of the tough, cord-like band that attaches the lower leg muscles to the heel bone (Achilles tendon).  This condition is usually caused by overusing the tendon and the ankle joint. It can also be caused by arthritis or normal aging.  The most common symptoms of this condition include pain, swelling, or stiffness in the Achilles tendon or in the back of the leg.  This condition is usually treated by decreasing or stopping activities that caused the tendinitis, icing the injured area, taking NSAIDs, and doing physical therapy. This information is not intended to replace advice given to you by your health care provider. Make sure you discuss any questions you have with your health  care provider. Document Revised: 01/27/2019 Document Reviewed: 01/27/2019 Elsevier Patient Education  [place].

## 2020-04-29 ENCOUNTER — Other Ambulatory Visit: Payer: Self-pay | Admitting: Podiatry

## 2020-04-30 ENCOUNTER — Telehealth: Payer: Self-pay | Admitting: *Deleted

## 2020-04-30 NOTE — Telephone Encounter (Signed)
Pt did not CB to RS PV- PV and COlon 8-20 Canceled- NS letter mailed

## 2020-04-30 NOTE — Telephone Encounter (Signed)
Patient No showed 330 pm PV- Called number in chart- pt lives at [place] by [employer] pt I snot home at this time- I asked her to have him CB by 5 pm today to RS his PV - she states she will have him CB if he returns by 5 pm- informed her if no CB by 5 pm today, he will have to CB next week and RS PV and Colon - she verbalized understanding of instructions given today

## 2020-05-04 ENCOUNTER — Encounter: Payer: Self-pay | Admitting: Internal Medicine

## 2020-05-06 DIAGNOSIS — M25674 Stiffness of right foot, not elsewhere classified: Secondary | ICD-10-CM | POA: Diagnosis not present

## 2020-05-06 DIAGNOSIS — R269 Unspecified abnormalities of gait and mobility: Secondary | ICD-10-CM | POA: Diagnosis not present

## 2020-05-06 DIAGNOSIS — M62561 Muscle wasting and atrophy, not elsewhere classified, right lower leg: Secondary | ICD-10-CM | POA: Diagnosis not present

## 2020-05-06 DIAGNOSIS — M25671 Stiffness of right ankle, not elsewhere classified: Secondary | ICD-10-CM | POA: Diagnosis not present

## 2020-05-06 DIAGNOSIS — F339 Major depressive disorder, recurrent, unspecified: Secondary | ICD-10-CM | POA: Diagnosis not present

## 2020-05-06 DIAGNOSIS — E669 Obesity, unspecified: Secondary | ICD-10-CM | POA: Diagnosis not present

## 2020-05-06 DIAGNOSIS — M25571 Pain in right ankle and joints of right foot: Secondary | ICD-10-CM | POA: Diagnosis not present

## 2020-05-07 ENCOUNTER — Other Ambulatory Visit: Payer: Self-pay | Admitting: Podiatry

## 2020-05-12 ENCOUNTER — Telehealth: Payer: Self-pay | Admitting: Family Medicine

## 2020-05-12 DIAGNOSIS — M25671 Stiffness of right ankle, not elsewhere classified: Secondary | ICD-10-CM | POA: Diagnosis not present

## 2020-05-12 DIAGNOSIS — E669 Obesity, unspecified: Secondary | ICD-10-CM | POA: Diagnosis not present

## 2020-05-12 DIAGNOSIS — F339 Major depressive disorder, recurrent, unspecified: Secondary | ICD-10-CM | POA: Diagnosis not present

## 2020-05-12 DIAGNOSIS — M62561 Muscle wasting and atrophy, not elsewhere classified, right lower leg: Secondary | ICD-10-CM | POA: Diagnosis not present

## 2020-05-12 DIAGNOSIS — M25674 Stiffness of right foot, not elsewhere classified: Secondary | ICD-10-CM | POA: Diagnosis not present

## 2020-05-12 DIAGNOSIS — R269 Unspecified abnormalities of gait and mobility: Secondary | ICD-10-CM | POA: Diagnosis not present

## 2020-05-12 DIAGNOSIS — M25571 Pain in right ankle and joints of right foot: Secondary | ICD-10-CM | POA: Diagnosis not present

## 2020-05-12 NOTE — Telephone Encounter (Signed)
8/18-RC/CMA Unable to reach pt. Phone not set up to leave voicemail.

## 2020-05-12 NOTE — Telephone Encounter (Signed)
Pt states his left leg brace is broken, patient states he got a prescription for another however, the prescription is not descriptive enough, "Per patient prescription states broken leg brace needs another one ".   Patient states [facility].  [facility] Call Back #   [phone]

## 2020-05-13 ENCOUNTER — Other Ambulatory Visit: Payer: Self-pay | Admitting: Family Medicine

## 2020-05-14 ENCOUNTER — Ambulatory Visit (AMBULATORY_SURGERY_CENTER): Payer: Self-pay | Admitting: *Deleted

## 2020-05-14 ENCOUNTER — Other Ambulatory Visit: Payer: Self-pay

## 2020-05-14 ENCOUNTER — Encounter: Payer: Medicare Other | Admitting: Internal Medicine

## 2020-05-14 VITALS — Ht 74.0 in | Wt 274.0 lb

## 2020-05-14 DIAGNOSIS — M25571 Pain in right ankle and joints of right foot: Secondary | ICD-10-CM | POA: Diagnosis not present

## 2020-05-14 DIAGNOSIS — F339 Major depressive disorder, recurrent, unspecified: Secondary | ICD-10-CM | POA: Diagnosis not present

## 2020-05-14 DIAGNOSIS — M25674 Stiffness of right foot, not elsewhere classified: Secondary | ICD-10-CM | POA: Diagnosis not present

## 2020-05-14 DIAGNOSIS — M25671 Stiffness of right ankle, not elsewhere classified: Secondary | ICD-10-CM | POA: Diagnosis not present

## 2020-05-14 DIAGNOSIS — R269 Unspecified abnormalities of gait and mobility: Secondary | ICD-10-CM | POA: Diagnosis not present

## 2020-05-14 DIAGNOSIS — K625 Hemorrhage of anus and rectum: Secondary | ICD-10-CM

## 2020-05-14 DIAGNOSIS — M62561 Muscle wasting and atrophy, not elsewhere classified, right lower leg: Secondary | ICD-10-CM | POA: Diagnosis not present

## 2020-05-14 DIAGNOSIS — K5900 Constipation, unspecified: Secondary | ICD-10-CM

## 2020-05-14 DIAGNOSIS — E669 Obesity, unspecified: Secondary | ICD-10-CM | POA: Diagnosis not present

## 2020-05-14 MED ORDER — SUPREP BOWEL PREP KIT 17.5-3.13-1.6 GM/177ML PO SOLN: 1.0000 | Freq: Once | ORAL | 0 refills | Status: AC

## 2020-05-14 NOTE — Progress Notes (Signed)
covvax x 2  No egg or soy allergy known to patient  No issues with past sedation with any surgeries or procedures no intubation problems in the past  No FH of Malignant Hyperthermia No diet pills per patient No home 02 use per patient  No blood thinners per patient  Pt denies issues with constipation  No A fib or A flutter  EMMI video to pt or via MyChart  COVID 19 guidelines implemented in PV today with Pt and RN   Pt and I discussed Dr PErry's 2 day clears and mag citrate / Suprep instructions in PV today - pt's questions answered- We discussed prep instructions 2 x - pt verbalized understanding of instructions- encouraged to call with ANY questions    Due to the COVID-19 pandemic we are asking patients to follow these guidelines. Please only bring one care partner. Please be aware that your care partner may wait in the car in the parking lot or if they feel like they will be too hot to wait in the car, they may wait in the lobby on the 4th floor. All care partners are required to wear a mask the entire time (we do not have any that we can provide them), they need to practice social distancing, and we will do a Covid check for all patient's and care partners when you arrive. Also we will check their temperature and your temperature. If the care partner waits in their car they need to stay in the parking lot the entire time and we will call them on their cell phone when the patient is ready for discharge so they can bring the car to the front of the building. Also all patient's will need to wear a mask into building.

## 2020-05-18 ENCOUNTER — Other Ambulatory Visit: Payer: Medicare Other

## 2020-05-28 ENCOUNTER — Telehealth: Payer: Self-pay | Admitting: Internal Medicine

## 2020-05-28 NOTE — Addendum Note (Signed)
Addended by: Kelle Darting A on: 05/28/2020 11:01 AM   Modules accepted: Orders

## 2020-05-28 NOTE — Telephone Encounter (Signed)
Pt is requesting magnesium citrate be called in to his pharmacy

## 2020-05-28 NOTE — Telephone Encounter (Signed)
Sent patient message telling him that magnesium citrate is available over the counter.

## 2020-06-01 NOTE — Telephone Encounter (Signed)
Patient called states he is in a group home and they require a script he can not go buy it please advise

## 2020-06-02 DIAGNOSIS — M25674 Stiffness of right foot, not elsewhere classified: Secondary | ICD-10-CM | POA: Diagnosis not present

## 2020-06-02 DIAGNOSIS — M62561 Muscle wasting and atrophy, not elsewhere classified, right lower leg: Secondary | ICD-10-CM | POA: Diagnosis not present

## 2020-06-02 DIAGNOSIS — M25571 Pain in right ankle and joints of right foot: Secondary | ICD-10-CM | POA: Diagnosis not present

## 2020-06-02 DIAGNOSIS — R269 Unspecified abnormalities of gait and mobility: Secondary | ICD-10-CM | POA: Diagnosis not present

## 2020-06-02 DIAGNOSIS — F339 Major depressive disorder, recurrent, unspecified: Secondary | ICD-10-CM | POA: Diagnosis not present

## 2020-06-02 DIAGNOSIS — E669 Obesity, unspecified: Secondary | ICD-10-CM | POA: Diagnosis not present

## 2020-06-02 DIAGNOSIS — M25671 Stiffness of right ankle, not elsewhere classified: Secondary | ICD-10-CM | POA: Diagnosis not present

## 2020-06-02 MED ORDER — MAGNESIUM CITRATE PO SOLN: 1.0000 | Freq: Once | ORAL | 0 refills | Status: AC

## 2020-06-02 NOTE — Telephone Encounter (Signed)
Faxed rx for magnesiium citrate to [facility]

## 2020-06-02 NOTE — Addendum Note (Signed)
Addended by: Audrea Muscat on: 06/02/2020 03:36 PM   Modules accepted: Orders

## 2020-06-03 ENCOUNTER — Other Ambulatory Visit: Payer: Medicare Other

## 2020-06-03 ENCOUNTER — Ambulatory Visit (INDEPENDENT_AMBULATORY_CARE_PROVIDER_SITE_OTHER): Payer: Medicare Other | Admitting: Podiatry

## 2020-06-03 ENCOUNTER — Other Ambulatory Visit: Payer: Self-pay

## 2020-06-03 DIAGNOSIS — M216X1 Other acquired deformities of right foot: Secondary | ICD-10-CM | POA: Diagnosis not present

## 2020-06-03 DIAGNOSIS — M7661 Achilles tendinitis, right leg: Secondary | ICD-10-CM | POA: Diagnosis not present

## 2020-06-03 DIAGNOSIS — M21372 Foot drop, left foot: Secondary | ICD-10-CM

## 2020-06-03 DIAGNOSIS — M21371 Foot drop, right foot: Secondary | ICD-10-CM | POA: Diagnosis not present

## 2020-06-03 NOTE — Progress Notes (Signed)
  Subjective:  Patient ID: Joseph [name], male    DOB: 25-Jan-1976,  MRN: [id number]  Chief Complaint  Patient presents with  . Tendonitis    F?U Rt tendonitis Pt states," much better, pain is not pretty often, it still hurs but not as much; 7/10." - no swelling Tx: PT, voltaren gel, heel lift and stretching   . Diabetes    FBS:133 x 1 day A1C: na     44 y.o. male returns with the above complaint. History confirmed with patient. He lives in a group home. He is doing quite well, significantly better than he was.  Still having some persistent pain in the right side.  He is interested in ultrasound shockwave therapy.  Objective:  Physical Exam: warm, good capillary refill, no trophic changes or ulcerative lesions, normal DP and PT pulses and normal sensory exam. 0-5 muscle strength in dorsiflexion bilateral ankle. Trace contracture of the EHL noted.   Right Foot: Moderate pain on palpation to the posterior heel. Gastrosoleus equinus is present.     Assessment:   1. Tendonitis, Achilles, right   2. Acquired equinus deformity of right foot   3. Foot drop, bilateral      Plan:  Patient was evaluated and treated and all questions answered.   -Continue stretching exercises and rice protocol as needed -Prescription for ibuprofen 800 mg as needed every 6 hours, and Voltaren gel 4 g to posterior heel and tendon 4 times daily as needed sent to pharmacy -Continue use of felt heel lift placed in shoe to offload tendon -He has been researching ultrasound shockwave therapy and is interested in pursuing this.  I discussed with him the benefits of this in the that unfortunately would be have to be paid out-of-pocket and he is willing to do this.  We will have him scheduled for the ultrasound shockwave therapy treatment with ESWT and EPAT at the next available appointment.  Follow-up with me after.  Lanae Crumbly, DPM 06/05/2020     Return in about 2 months (around 08/03/2020) for re-check  Achilles tendon.

## 2020-06-03 NOTE — Patient Instructions (Signed)
Continue using the stretching exercises, and the Voltaren gel. Ibuprofen as needed

## 2020-06-04 ENCOUNTER — Other Ambulatory Visit (INDEPENDENT_AMBULATORY_CARE_PROVIDER_SITE_OTHER): Payer: Medicare Other

## 2020-06-04 DIAGNOSIS — R269 Unspecified abnormalities of gait and mobility: Secondary | ICD-10-CM | POA: Diagnosis not present

## 2020-06-04 DIAGNOSIS — M62561 Muscle wasting and atrophy, not elsewhere classified, right lower leg: Secondary | ICD-10-CM | POA: Diagnosis not present

## 2020-06-04 DIAGNOSIS — E871 Hypo-osmolality and hyponatremia: Secondary | ICD-10-CM | POA: Diagnosis not present

## 2020-06-04 DIAGNOSIS — E785 Hyperlipidemia, unspecified: Secondary | ICD-10-CM

## 2020-06-04 DIAGNOSIS — M25571 Pain in right ankle and joints of right foot: Secondary | ICD-10-CM | POA: Diagnosis not present

## 2020-06-04 DIAGNOSIS — M25674 Stiffness of right foot, not elsewhere classified: Secondary | ICD-10-CM | POA: Diagnosis not present

## 2020-06-04 DIAGNOSIS — E669 Obesity, unspecified: Secondary | ICD-10-CM | POA: Diagnosis not present

## 2020-06-04 DIAGNOSIS — F339 Major depressive disorder, recurrent, unspecified: Secondary | ICD-10-CM | POA: Diagnosis not present

## 2020-06-04 DIAGNOSIS — M25671 Stiffness of right ankle, not elsewhere classified: Secondary | ICD-10-CM | POA: Diagnosis not present

## 2020-06-05 LAB — COMPREHENSIVE METABOLIC PANEL
AG Ratio: 1.8 (calc) (ref 1.0–2.5)
ALT: 35 U/L (ref 9–46)
AST: 23 U/L (ref 10–40)
Albumin: 4.3 g/dL (ref 3.6–5.1)
Alkaline phosphatase (APISO): 49 U/L (ref 36–130)
BUN: 9 mg/dL (ref 7–25)
CO2: 27 mmol/L (ref 20–32)
Calcium: 9.2 mg/dL (ref 8.6–10.3)
Chloride: 101 mmol/L (ref 98–110)
Creat: 0.93 mg/dL (ref 0.60–1.35)
Globulin: 2.4 g/dL (calc) (ref 1.9–3.7)
Glucose, Bld: 97 mg/dL (ref 65–99)
Potassium: 4.4 mmol/L (ref 3.5–5.3)
Sodium: 136 mmol/L (ref 135–146)
Total Bilirubin: 0.4 mg/dL (ref 0.2–1.2)
Total Protein: 6.7 g/dL (ref 6.1–8.1)

## 2020-06-05 LAB — LIPID PANEL
Cholesterol: 161 mg/dL (ref <200)
HDL: 43 mg/dL (ref >=40)
LDL Cholesterol (Calc): 92 mg/dL (calc)
Non-HDL Cholesterol (Calc): 118 mg/dL (calc) (ref <130)
Total CHOL/HDL Ratio: 3.7 (calc) (ref <5.0)
Triglycerides: 154 mg/dL — ABNORMAL HIGH (ref <150)

## 2020-06-07 ENCOUNTER — Encounter: Payer: Self-pay | Admitting: Internal Medicine

## 2020-06-07 ENCOUNTER — Telehealth: Payer: Self-pay

## 2020-06-07 ENCOUNTER — Other Ambulatory Visit: Payer: Self-pay

## 2020-06-07 ENCOUNTER — Ambulatory Visit (AMBULATORY_SURGERY_CENTER): Payer: Medicare Other | Admitting: Internal Medicine

## 2020-06-07 VITALS — BP 123/82 | HR 62 | Temp 97.5°F | Resp 12 | Ht 74.0 in | Wt 274.0 lb

## 2020-06-07 DIAGNOSIS — K5900 Constipation, unspecified: Secondary | ICD-10-CM

## 2020-06-07 DIAGNOSIS — K625 Hemorrhage of anus and rectum: Secondary | ICD-10-CM | POA: Diagnosis not present

## 2020-06-07 DIAGNOSIS — Z1211 Encounter for screening for malignant neoplasm of colon: Secondary | ICD-10-CM

## 2020-06-07 MED ORDER — SODIUM CHLORIDE 0.9 % IV SOLN: 500.0000 mL | Freq: Once | INTRAVENOUS | Status: DC

## 2020-06-07 NOTE — Op Note (Signed)
[facility] Patient Name: Joseph [name] Procedure Date: 06/07/2020 3:18 PM MRN: [id number] Endoscopist: Docia Chuck. Henrene Pastor , MD Age: 44 Referring MD:  Date of Birth: 11-07-1975 Gender: Male Account #: [id number] Procedure:                Colonoscopy Indications:              Rectal bleeding, Constipation, colon cancer                            screening. Previous attempt at colonoscopy December                            2020 aborted secondary to poor preparation Medicines:                Monitored Anesthesia Care Procedure:                Pre-Anesthesia Assessment:                           - Prior to the procedure, a History and Physical                            was performed, and patient medications and                            allergies were reviewed. The patient's tolerance of                            previous anesthesia was also reviewed. The risks                            and benefits of the procedure and the sedation                            options and risks were discussed with the patient.                            All questions were answered, and informed consent                            was obtained. Prior Anticoagulants: The patient has                            taken no previous anticoagulant or antiplatelet                            agents. ASA Grade Assessment: II - A patient with                            mild systemic disease. After reviewing the risks                            and benefits, the patient was deemed in  satisfactory condition to undergo the procedure.                           After obtaining informed consent, the colonoscope                            was passed under direct vision. Throughout the                            procedure, the patient's blood pressure, pulse, and                            oxygen saturations were monitored continuously. The                            Colonoscope was introduced  through the anus and                            advanced to the the cecum, identified by                            appendiceal orifice and ileocecal valve. The                            ileocecal valve, appendiceal orifice, and rectum                            were photographed. The quality of the bowel                            preparation was good. The colonoscopy was performed                            without difficulty. The patient tolerated the                            procedure well. The bowel preparation used was 2                            days of clear liquids and magnesium citrate along                            with SUPREP via split dose instruction. Scope In: 3:29:45 PM Scope Out: 3:47:17 PM Scope Withdrawal Time: 0 hours 14 minutes 16 seconds  Total Procedure Duration: 0 hours 17 minutes 32 seconds  Findings:                 The entire examined colon appeared normal on direct                            and retroflexion views. Small internal hemorrhoids                            noted. Complications:  No immediate complications. Estimated blood loss:                            None. Estimated Blood Loss:     Estimated blood loss: none. Impression:               - The entire examined colon is normal on direct and                            retroflexion views.                           - No specimens collected. Recommendation:           - Repeat colonoscopy in 10 years for screening                            purposes.                           - Patient has a contact number available for                            emergencies. The signs and symptoms of potential                            delayed complications were discussed with the                            patient. Return to normal activities tomorrow.                            Written discharge instructions were provided to the                            patient.                           - Resume  previous diet.                           - Continue present medications. Docia Chuck. Henrene Pastor, MD 06/07/2020 3:53:43 PM This report has been signed electronically.

## 2020-06-07 NOTE — Progress Notes (Signed)
No problems noted in the recovery room. maw 

## 2020-06-07 NOTE — Progress Notes (Signed)
Pt's states no medical or surgical changes since previsit or office visit.  SH vitals. 

## 2020-06-07 NOTE — Patient Instructions (Addendum)
Handout was given to you on Hemorrhoids. Your blood sugar was 99 in the recovery room. You may resume your current medications today. Repeat colonoscopy in 10 years for screening purposes. Please call if any questions or concerns.    YOU HAD AN ENDOSCOPIC PROCEDURE TODAY AT [facility]:   Refer to the procedure report that was given to you for any specific questions about what was found during the examination.  If the procedure report does not answer your questions, please call your gastroenterologist to clarify.  If you requested that your care partner not be given the details of your procedure findings, then the procedure report has been included in a sealed envelope for you to review at your convenience later.  YOU SHOULD EXPECT: Some feelings of bloating in the abdomen. Passage of more gas than usual.  Walking can help get rid of the air that was put into your GI tract during the procedure and reduce the bloating. If you had a lower endoscopy (such as a colonoscopy or flexible sigmoidoscopy) you may notice spotting of blood in your stool or on the toilet paper. If you underwent a bowel prep for your procedure, you may not have a normal bowel movement for a few days.  Please Note:  You might notice some irritation and congestion in your nose or some drainage.  This is from the oxygen used during your procedure.  There is no need for concern and it should clear up in a day or so.  SYMPTOMS TO REPORT IMMEDIATELY:   Following lower endoscopy (colonoscopy or flexible sigmoidoscopy):  Excessive amounts of blood in the stool  Significant tenderness or worsening of abdominal pains  Swelling of the abdomen that is new, acute  Fever of 100F or higher   For urgent or emergent issues, a gastroenterologist can be reached at any hour by calling [phone]. Do not use MyChart messaging for urgent concerns.    DIET:  We do recommend a small meal at first, but then you may  proceed to your regular diet.  Drink plenty of fluids but you should avoid alcoholic beverages for 24 hours.  ACTIVITY:  You should plan to take it easy for the rest of today and you should NOT DRIVE or use heavy machinery until tomorrow (because of the sedation medicines used during the test).    FOLLOW UP: Our staff will call the number listed on your records 48-72 hours following your procedure to check on you and address any questions or concerns that you may have regarding the information given to you following your procedure. If we do not reach you, we will leave a message.  We will attempt to reach you two times.  During this call, we will ask if you have developed any symptoms of COVID 19. If you develop any symptoms (ie: fever, flu-like symptoms, shortness of breath, cough etc.) before then, please call [phone].  If you test positive for Covid 19 in the 2 weeks post procedure, please call and report this information to [place].    If any biopsies were taken you will be contacted by phone or by letter within the next 1-3 weeks.  Please call us at [phone] if you have not heard about the biopsies in 3 weeks.    SIGNATURES/CONFIDENTIALITY: You and/or your care partner have signed paperwork which will be entered into your electronic medical record.  These signatures attest to the fact that that the information above on your After Visit  Summary has been reviewed and is understood.  Full responsibility of the confidentiality of this discharge information lies with you and/or your care-partner.

## 2020-06-07 NOTE — Telephone Encounter (Signed)
Received call from April at patient's group home with questions regarding patient's prep. Pt. Took his first bottle of Suprep yesterday morning at 0900 instead of the Mag Citrate, then took the 2nd bottle of Suprep at 1800 yesterday. He has not taken the Mag Citrate. April states his bowels are clear. Conferred with Dr. Henrene Pastor who said patient may go ahead and take the Mag Citrate now. Also advised April to have patient drink plenty of clear liquids to stay hydrated and help prep to work and very important to STOP drinking all fluids at 1230 today, procedure is scheduled for 1530. April verbalizes understanding.

## 2020-06-07 NOTE — Progress Notes (Signed)
pt tolerated well. VSS. awake and to recovery. Report given to RN.  

## 2020-06-09 ENCOUNTER — Telehealth: Payer: Self-pay

## 2020-06-09 DIAGNOSIS — R269 Unspecified abnormalities of gait and mobility: Secondary | ICD-10-CM | POA: Diagnosis not present

## 2020-06-09 DIAGNOSIS — M25671 Stiffness of right ankle, not elsewhere classified: Secondary | ICD-10-CM | POA: Diagnosis not present

## 2020-06-09 DIAGNOSIS — F339 Major depressive disorder, recurrent, unspecified: Secondary | ICD-10-CM | POA: Diagnosis not present

## 2020-06-09 DIAGNOSIS — M25674 Stiffness of right foot, not elsewhere classified: Secondary | ICD-10-CM | POA: Diagnosis not present

## 2020-06-09 DIAGNOSIS — M25571 Pain in right ankle and joints of right foot: Secondary | ICD-10-CM | POA: Diagnosis not present

## 2020-06-09 DIAGNOSIS — M62561 Muscle wasting and atrophy, not elsewhere classified, right lower leg: Secondary | ICD-10-CM | POA: Diagnosis not present

## 2020-06-09 DIAGNOSIS — E669 Obesity, unspecified: Secondary | ICD-10-CM | POA: Diagnosis not present

## 2020-06-09 NOTE — Telephone Encounter (Signed)
  Follow up Call-  Call back number 06/07/2020 09/08/2019  Post procedure Call Back phone  # [phone]  Permission to leave phone message Yes Yes  Some recent data might be hidden     Patient questions:  Do you have a fever, pain , or abdominal swelling? No. Pain Score  0 *  Have you tolerated food without any problems? Yes.    Have you been able to return to your normal activities? Yes.    Do you have any questions about your discharge instructions: Diet   No. Medications  No. Follow up visit  No.  Do you have questions or concerns about your Care? No.  Actions: * If pain score is 4 or above: No action needed, pain <4.  1. Have you developed a fever since your procedure? no  2.   Have you had an respiratory symptoms (SOB or cough) since your procedure? no  3.   Have you tested positive for COVID 19 since your procedure no  4.   Have you had any family members/close contacts diagnosed with the COVID 19 since your procedure?  no   If yes to any of these questions please route to Joylene John, RN and Joella Prince, RN

## 2020-06-11 DIAGNOSIS — M25674 Stiffness of right foot, not elsewhere classified: Secondary | ICD-10-CM | POA: Diagnosis not present

## 2020-06-11 DIAGNOSIS — R269 Unspecified abnormalities of gait and mobility: Secondary | ICD-10-CM | POA: Diagnosis not present

## 2020-06-11 DIAGNOSIS — E669 Obesity, unspecified: Secondary | ICD-10-CM | POA: Diagnosis not present

## 2020-06-11 DIAGNOSIS — M62561 Muscle wasting and atrophy, not elsewhere classified, right lower leg: Secondary | ICD-10-CM | POA: Diagnosis not present

## 2020-06-11 DIAGNOSIS — M25571 Pain in right ankle and joints of right foot: Secondary | ICD-10-CM | POA: Diagnosis not present

## 2020-06-11 DIAGNOSIS — M25671 Stiffness of right ankle, not elsewhere classified: Secondary | ICD-10-CM | POA: Diagnosis not present

## 2020-06-11 DIAGNOSIS — F339 Major depressive disorder, recurrent, unspecified: Secondary | ICD-10-CM | POA: Diagnosis not present

## 2020-06-14 DIAGNOSIS — M25571 Pain in right ankle and joints of right foot: Secondary | ICD-10-CM | POA: Diagnosis not present

## 2020-06-14 DIAGNOSIS — M62561 Muscle wasting and atrophy, not elsewhere classified, right lower leg: Secondary | ICD-10-CM | POA: Diagnosis not present

## 2020-06-14 DIAGNOSIS — F339 Major depressive disorder, recurrent, unspecified: Secondary | ICD-10-CM | POA: Diagnosis not present

## 2020-06-14 DIAGNOSIS — M25674 Stiffness of right foot, not elsewhere classified: Secondary | ICD-10-CM | POA: Diagnosis not present

## 2020-06-14 DIAGNOSIS — E669 Obesity, unspecified: Secondary | ICD-10-CM | POA: Diagnosis not present

## 2020-06-14 DIAGNOSIS — R269 Unspecified abnormalities of gait and mobility: Secondary | ICD-10-CM | POA: Diagnosis not present

## 2020-06-14 DIAGNOSIS — M25671 Stiffness of right ankle, not elsewhere classified: Secondary | ICD-10-CM | POA: Diagnosis not present

## 2020-06-18 DIAGNOSIS — E669 Obesity, unspecified: Secondary | ICD-10-CM | POA: Diagnosis not present

## 2020-06-18 DIAGNOSIS — M25671 Stiffness of right ankle, not elsewhere classified: Secondary | ICD-10-CM | POA: Diagnosis not present

## 2020-06-18 DIAGNOSIS — M25571 Pain in right ankle and joints of right foot: Secondary | ICD-10-CM | POA: Diagnosis not present

## 2020-06-18 DIAGNOSIS — R269 Unspecified abnormalities of gait and mobility: Secondary | ICD-10-CM | POA: Diagnosis not present

## 2020-06-18 DIAGNOSIS — F339 Major depressive disorder, recurrent, unspecified: Secondary | ICD-10-CM | POA: Diagnosis not present

## 2020-06-18 DIAGNOSIS — M25674 Stiffness of right foot, not elsewhere classified: Secondary | ICD-10-CM | POA: Diagnosis not present

## 2020-06-18 DIAGNOSIS — M62561 Muscle wasting and atrophy, not elsewhere classified, right lower leg: Secondary | ICD-10-CM | POA: Diagnosis not present

## 2020-06-21 DIAGNOSIS — M25671 Stiffness of right ankle, not elsewhere classified: Secondary | ICD-10-CM | POA: Diagnosis not present

## 2020-06-21 DIAGNOSIS — M25674 Stiffness of right foot, not elsewhere classified: Secondary | ICD-10-CM | POA: Diagnosis not present

## 2020-06-21 DIAGNOSIS — E669 Obesity, unspecified: Secondary | ICD-10-CM | POA: Diagnosis not present

## 2020-06-21 DIAGNOSIS — M62561 Muscle wasting and atrophy, not elsewhere classified, right lower leg: Secondary | ICD-10-CM | POA: Diagnosis not present

## 2020-06-21 DIAGNOSIS — M25571 Pain in right ankle and joints of right foot: Secondary | ICD-10-CM | POA: Diagnosis not present

## 2020-06-21 DIAGNOSIS — R269 Unspecified abnormalities of gait and mobility: Secondary | ICD-10-CM | POA: Diagnosis not present

## 2020-06-21 DIAGNOSIS — F339 Major depressive disorder, recurrent, unspecified: Secondary | ICD-10-CM | POA: Diagnosis not present

## 2020-06-24 DIAGNOSIS — Z23 Encounter for immunization: Secondary | ICD-10-CM | POA: Diagnosis not present

## 2020-06-25 DIAGNOSIS — M25571 Pain in right ankle and joints of right foot: Secondary | ICD-10-CM | POA: Diagnosis not present

## 2020-06-25 DIAGNOSIS — M25671 Stiffness of right ankle, not elsewhere classified: Secondary | ICD-10-CM | POA: Diagnosis not present

## 2020-06-25 DIAGNOSIS — M62561 Muscle wasting and atrophy, not elsewhere classified, right lower leg: Secondary | ICD-10-CM | POA: Diagnosis not present

## 2020-06-25 DIAGNOSIS — R269 Unspecified abnormalities of gait and mobility: Secondary | ICD-10-CM | POA: Diagnosis not present

## 2020-06-25 DIAGNOSIS — M25674 Stiffness of right foot, not elsewhere classified: Secondary | ICD-10-CM | POA: Diagnosis not present

## 2020-06-25 DIAGNOSIS — E669 Obesity, unspecified: Secondary | ICD-10-CM | POA: Diagnosis not present

## 2020-06-25 DIAGNOSIS — F339 Major depressive disorder, recurrent, unspecified: Secondary | ICD-10-CM | POA: Diagnosis not present

## 2020-06-28 ENCOUNTER — Other Ambulatory Visit: Payer: Self-pay

## 2020-06-28 ENCOUNTER — Ambulatory Visit (INDEPENDENT_AMBULATORY_CARE_PROVIDER_SITE_OTHER): Payer: Medicare Other | Admitting: *Deleted

## 2020-06-28 DIAGNOSIS — M7661 Achilles tendinitis, right leg: Secondary | ICD-10-CM

## 2020-06-28 DIAGNOSIS — M216X1 Other acquired deformities of right foot: Secondary | ICD-10-CM

## 2020-06-28 NOTE — Patient Instructions (Signed)

## 2020-06-28 NOTE — Progress Notes (Signed)
Patient presents for the 1st EPAT treatment today with complaint of plantar and posterior heel pain right. Diagnosed with plantar fasciitis/achilles tendonitis by Dr. Sherryle Lis. This has been ongoing for several months. The patient has tried ice, stretching, NSAIDS and supportive shoe gear with no long term relief.   Most of the pain is located plantar and posterior heel and along the edge of plantar heel.  ESWT administered and tolerated well.Treatment settings initiated at:   Energy: 20  Ended treatment session today with 3000 shocks at the following settings:   Energy: 20  Frequency: 5.0  Joules: 19.62   Reviewed post EPAT instructions. Advised to avoid ice and NSAIDs throughout the treatment process and to utilize boot or supportive shoes for at least the next 3 days.  Follow up for 2nd treatment in 1 week.  He has scheduled appointments for 3 consecutive treatments as well as following up with Dr. Sherryle Lis early November.

## 2020-07-02 DIAGNOSIS — M25571 Pain in right ankle and joints of right foot: Secondary | ICD-10-CM | POA: Diagnosis not present

## 2020-07-02 DIAGNOSIS — F339 Major depressive disorder, recurrent, unspecified: Secondary | ICD-10-CM | POA: Diagnosis not present

## 2020-07-02 DIAGNOSIS — R269 Unspecified abnormalities of gait and mobility: Secondary | ICD-10-CM | POA: Diagnosis not present

## 2020-07-02 DIAGNOSIS — M25671 Stiffness of right ankle, not elsewhere classified: Secondary | ICD-10-CM | POA: Diagnosis not present

## 2020-07-02 DIAGNOSIS — E669 Obesity, unspecified: Secondary | ICD-10-CM | POA: Diagnosis not present

## 2020-07-02 DIAGNOSIS — M25674 Stiffness of right foot, not elsewhere classified: Secondary | ICD-10-CM | POA: Diagnosis not present

## 2020-07-02 DIAGNOSIS — M62561 Muscle wasting and atrophy, not elsewhere classified, right lower leg: Secondary | ICD-10-CM | POA: Diagnosis not present

## 2020-07-05 DIAGNOSIS — M25674 Stiffness of right foot, not elsewhere classified: Secondary | ICD-10-CM | POA: Diagnosis not present

## 2020-07-05 DIAGNOSIS — E669 Obesity, unspecified: Secondary | ICD-10-CM | POA: Diagnosis not present

## 2020-07-05 DIAGNOSIS — M62561 Muscle wasting and atrophy, not elsewhere classified, right lower leg: Secondary | ICD-10-CM | POA: Diagnosis not present

## 2020-07-05 DIAGNOSIS — R269 Unspecified abnormalities of gait and mobility: Secondary | ICD-10-CM | POA: Diagnosis not present

## 2020-07-05 DIAGNOSIS — M25571 Pain in right ankle and joints of right foot: Secondary | ICD-10-CM | POA: Diagnosis not present

## 2020-07-05 DIAGNOSIS — M25671 Stiffness of right ankle, not elsewhere classified: Secondary | ICD-10-CM | POA: Diagnosis not present

## 2020-07-05 DIAGNOSIS — F339 Major depressive disorder, recurrent, unspecified: Secondary | ICD-10-CM | POA: Diagnosis not present

## 2020-07-09 ENCOUNTER — Ambulatory Visit (INDEPENDENT_AMBULATORY_CARE_PROVIDER_SITE_OTHER): Payer: Medicare Other | Admitting: *Deleted

## 2020-07-09 DIAGNOSIS — M25671 Stiffness of right ankle, not elsewhere classified: Secondary | ICD-10-CM | POA: Diagnosis not present

## 2020-07-09 DIAGNOSIS — M25571 Pain in right ankle and joints of right foot: Secondary | ICD-10-CM | POA: Diagnosis not present

## 2020-07-09 DIAGNOSIS — M25674 Stiffness of right foot, not elsewhere classified: Secondary | ICD-10-CM | POA: Diagnosis not present

## 2020-07-09 DIAGNOSIS — M62561 Muscle wasting and atrophy, not elsewhere classified, right lower leg: Secondary | ICD-10-CM | POA: Diagnosis not present

## 2020-07-09 DIAGNOSIS — M7661 Achilles tendinitis, right leg: Secondary | ICD-10-CM

## 2020-07-09 DIAGNOSIS — F339 Major depressive disorder, recurrent, unspecified: Secondary | ICD-10-CM | POA: Diagnosis not present

## 2020-07-09 DIAGNOSIS — R269 Unspecified abnormalities of gait and mobility: Secondary | ICD-10-CM | POA: Diagnosis not present

## 2020-07-09 DIAGNOSIS — E669 Obesity, unspecified: Secondary | ICD-10-CM | POA: Diagnosis not present

## 2020-07-09 NOTE — Progress Notes (Signed)
Patient presents for the 2nd EPAT treatment today with complaint of plantar and posterior heel pain right. Diagnosed with plantar fasciitis/achilles tendonitis by Dr. Sherryle Lis. This has been ongoing for several months. The patient has tried ice, stretching, NSAIDS and supportive shoe gear with no long term relief.   Most of the pain is very pinpoint to the central plantar heel. He says that his pain has improved significantly.   ESWT administered and tolerated well.Treatment settings initiated at:   Energy: 25  Ended treatment session today with 3000 shocks at the following settings:   Energy: 25  Frequency: 4.0  Joules: 24.52   Reviewed post EPAT instructions. Advised to avoid ice and NSAIDs throughout the treatment process and to utilize boot or supportive shoes for at least the next 3 days.  Follow up for 3rd treatment in 1 week.  He has scheduled appointments for 3 consecutive treatments as well as following up with Dr. Sherryle Lis early November.

## 2020-07-12 DIAGNOSIS — M25674 Stiffness of right foot, not elsewhere classified: Secondary | ICD-10-CM | POA: Diagnosis not present

## 2020-07-12 DIAGNOSIS — M62561 Muscle wasting and atrophy, not elsewhere classified, right lower leg: Secondary | ICD-10-CM | POA: Diagnosis not present

## 2020-07-12 DIAGNOSIS — F339 Major depressive disorder, recurrent, unspecified: Secondary | ICD-10-CM | POA: Diagnosis not present

## 2020-07-12 DIAGNOSIS — R269 Unspecified abnormalities of gait and mobility: Secondary | ICD-10-CM | POA: Diagnosis not present

## 2020-07-12 DIAGNOSIS — M25671 Stiffness of right ankle, not elsewhere classified: Secondary | ICD-10-CM | POA: Diagnosis not present

## 2020-07-12 DIAGNOSIS — M25571 Pain in right ankle and joints of right foot: Secondary | ICD-10-CM | POA: Diagnosis not present

## 2020-07-12 DIAGNOSIS — E669 Obesity, unspecified: Secondary | ICD-10-CM | POA: Diagnosis not present

## 2020-07-15 ENCOUNTER — Telehealth: Payer: Self-pay | Admitting: Family Medicine

## 2020-07-15 NOTE — Telephone Encounter (Signed)
Called phone number back. VM message left for a return call. Spoke with PCP and care giver will need to contact [facility] [place] to get letter for [facility] Pt was seen at [facility] not here. They will be able to write the note for pt to get a boot replacement

## 2020-07-15 NOTE — Telephone Encounter (Signed)
Caller name: Raliegh Ip [facility] giver) Call back number: [phone]  Herby Abraham need a letter fax to [facility] stating patient was seeing face to face and his foot brace is broken.  Letter to: [facility] Att: [facility] Phone # [phone] Fax # [phone]

## 2020-07-16 DIAGNOSIS — M25571 Pain in right ankle and joints of right foot: Secondary | ICD-10-CM | POA: Diagnosis not present

## 2020-07-16 DIAGNOSIS — M25671 Stiffness of right ankle, not elsewhere classified: Secondary | ICD-10-CM | POA: Diagnosis not present

## 2020-07-16 DIAGNOSIS — M25674 Stiffness of right foot, not elsewhere classified: Secondary | ICD-10-CM | POA: Diagnosis not present

## 2020-07-16 DIAGNOSIS — E669 Obesity, unspecified: Secondary | ICD-10-CM | POA: Diagnosis not present

## 2020-07-16 DIAGNOSIS — R269 Unspecified abnormalities of gait and mobility: Secondary | ICD-10-CM | POA: Diagnosis not present

## 2020-07-16 DIAGNOSIS — M62561 Muscle wasting and atrophy, not elsewhere classified, right lower leg: Secondary | ICD-10-CM | POA: Diagnosis not present

## 2020-07-16 DIAGNOSIS — F339 Major depressive disorder, recurrent, unspecified: Secondary | ICD-10-CM | POA: Diagnosis not present

## 2020-07-19 DIAGNOSIS — M62561 Muscle wasting and atrophy, not elsewhere classified, right lower leg: Secondary | ICD-10-CM | POA: Diagnosis not present

## 2020-07-19 DIAGNOSIS — R269 Unspecified abnormalities of gait and mobility: Secondary | ICD-10-CM | POA: Diagnosis not present

## 2020-07-19 DIAGNOSIS — F339 Major depressive disorder, recurrent, unspecified: Secondary | ICD-10-CM | POA: Diagnosis not present

## 2020-07-19 DIAGNOSIS — E669 Obesity, unspecified: Secondary | ICD-10-CM | POA: Diagnosis not present

## 2020-07-19 DIAGNOSIS — M25674 Stiffness of right foot, not elsewhere classified: Secondary | ICD-10-CM | POA: Diagnosis not present

## 2020-07-19 DIAGNOSIS — M25571 Pain in right ankle and joints of right foot: Secondary | ICD-10-CM | POA: Diagnosis not present

## 2020-07-19 DIAGNOSIS — M25671 Stiffness of right ankle, not elsewhere classified: Secondary | ICD-10-CM | POA: Diagnosis not present

## 2020-07-23 ENCOUNTER — Other Ambulatory Visit: Payer: Medicare Other

## 2020-07-23 DIAGNOSIS — M25571 Pain in right ankle and joints of right foot: Secondary | ICD-10-CM | POA: Diagnosis not present

## 2020-07-23 DIAGNOSIS — F339 Major depressive disorder, recurrent, unspecified: Secondary | ICD-10-CM | POA: Diagnosis not present

## 2020-07-23 DIAGNOSIS — M62561 Muscle wasting and atrophy, not elsewhere classified, right lower leg: Secondary | ICD-10-CM | POA: Diagnosis not present

## 2020-07-23 DIAGNOSIS — M25671 Stiffness of right ankle, not elsewhere classified: Secondary | ICD-10-CM | POA: Diagnosis not present

## 2020-07-23 DIAGNOSIS — E669 Obesity, unspecified: Secondary | ICD-10-CM | POA: Diagnosis not present

## 2020-07-23 DIAGNOSIS — M25674 Stiffness of right foot, not elsewhere classified: Secondary | ICD-10-CM | POA: Diagnosis not present

## 2020-07-23 DIAGNOSIS — R269 Unspecified abnormalities of gait and mobility: Secondary | ICD-10-CM | POA: Diagnosis not present

## 2020-07-26 DIAGNOSIS — F339 Major depressive disorder, recurrent, unspecified: Secondary | ICD-10-CM | POA: Diagnosis not present

## 2020-07-26 DIAGNOSIS — M25674 Stiffness of right foot, not elsewhere classified: Secondary | ICD-10-CM | POA: Diagnosis not present

## 2020-07-26 DIAGNOSIS — E669 Obesity, unspecified: Secondary | ICD-10-CM | POA: Diagnosis not present

## 2020-07-26 DIAGNOSIS — R269 Unspecified abnormalities of gait and mobility: Secondary | ICD-10-CM | POA: Diagnosis not present

## 2020-07-26 DIAGNOSIS — M62561 Muscle wasting and atrophy, not elsewhere classified, right lower leg: Secondary | ICD-10-CM | POA: Diagnosis not present

## 2020-07-26 DIAGNOSIS — M25571 Pain in right ankle and joints of right foot: Secondary | ICD-10-CM | POA: Diagnosis not present

## 2020-07-26 DIAGNOSIS — M25671 Stiffness of right ankle, not elsewhere classified: Secondary | ICD-10-CM | POA: Diagnosis not present

## 2020-07-29 ENCOUNTER — Ambulatory Visit: Payer: Medicare Other | Admitting: Podiatry

## 2020-07-30 DIAGNOSIS — M25671 Stiffness of right ankle, not elsewhere classified: Secondary | ICD-10-CM | POA: Diagnosis not present

## 2020-07-30 DIAGNOSIS — F339 Major depressive disorder, recurrent, unspecified: Secondary | ICD-10-CM | POA: Diagnosis not present

## 2020-07-30 DIAGNOSIS — E669 Obesity, unspecified: Secondary | ICD-10-CM | POA: Diagnosis not present

## 2020-07-30 DIAGNOSIS — M62561 Muscle wasting and atrophy, not elsewhere classified, right lower leg: Secondary | ICD-10-CM | POA: Diagnosis not present

## 2020-07-30 DIAGNOSIS — M25571 Pain in right ankle and joints of right foot: Secondary | ICD-10-CM | POA: Diagnosis not present

## 2020-07-30 DIAGNOSIS — M25674 Stiffness of right foot, not elsewhere classified: Secondary | ICD-10-CM | POA: Diagnosis not present

## 2020-07-30 DIAGNOSIS — R269 Unspecified abnormalities of gait and mobility: Secondary | ICD-10-CM | POA: Diagnosis not present

## 2020-08-02 DIAGNOSIS — M25671 Stiffness of right ankle, not elsewhere classified: Secondary | ICD-10-CM | POA: Diagnosis not present

## 2020-08-02 DIAGNOSIS — F339 Major depressive disorder, recurrent, unspecified: Secondary | ICD-10-CM | POA: Diagnosis not present

## 2020-08-02 DIAGNOSIS — M25571 Pain in right ankle and joints of right foot: Secondary | ICD-10-CM | POA: Diagnosis not present

## 2020-08-02 DIAGNOSIS — M25674 Stiffness of right foot, not elsewhere classified: Secondary | ICD-10-CM | POA: Diagnosis not present

## 2020-08-02 DIAGNOSIS — E669 Obesity, unspecified: Secondary | ICD-10-CM | POA: Diagnosis not present

## 2020-08-02 DIAGNOSIS — M62561 Muscle wasting and atrophy, not elsewhere classified, right lower leg: Secondary | ICD-10-CM | POA: Diagnosis not present

## 2020-08-02 DIAGNOSIS — R269 Unspecified abnormalities of gait and mobility: Secondary | ICD-10-CM | POA: Diagnosis not present

## 2020-08-06 DIAGNOSIS — F339 Major depressive disorder, recurrent, unspecified: Secondary | ICD-10-CM | POA: Diagnosis not present

## 2020-08-06 DIAGNOSIS — R269 Unspecified abnormalities of gait and mobility: Secondary | ICD-10-CM | POA: Diagnosis not present

## 2020-08-06 DIAGNOSIS — E669 Obesity, unspecified: Secondary | ICD-10-CM | POA: Diagnosis not present

## 2020-08-06 DIAGNOSIS — M25674 Stiffness of right foot, not elsewhere classified: Secondary | ICD-10-CM | POA: Diagnosis not present

## 2020-08-06 DIAGNOSIS — M62561 Muscle wasting and atrophy, not elsewhere classified, right lower leg: Secondary | ICD-10-CM | POA: Diagnosis not present

## 2020-08-06 DIAGNOSIS — M25671 Stiffness of right ankle, not elsewhere classified: Secondary | ICD-10-CM | POA: Diagnosis not present

## 2020-08-06 DIAGNOSIS — M25571 Pain in right ankle and joints of right foot: Secondary | ICD-10-CM | POA: Diagnosis not present

## 2020-08-09 DIAGNOSIS — E119 Type 2 diabetes mellitus without complications: Secondary | ICD-10-CM | POA: Diagnosis not present

## 2020-08-09 DIAGNOSIS — R269 Unspecified abnormalities of gait and mobility: Secondary | ICD-10-CM | POA: Diagnosis not present

## 2020-08-09 DIAGNOSIS — M25674 Stiffness of right foot, not elsewhere classified: Secondary | ICD-10-CM | POA: Diagnosis not present

## 2020-08-09 DIAGNOSIS — M25671 Stiffness of right ankle, not elsewhere classified: Secondary | ICD-10-CM | POA: Diagnosis not present

## 2020-08-09 DIAGNOSIS — E669 Obesity, unspecified: Secondary | ICD-10-CM | POA: Diagnosis not present

## 2020-08-09 DIAGNOSIS — E663 Overweight: Secondary | ICD-10-CM | POA: Diagnosis not present

## 2020-08-09 DIAGNOSIS — M25571 Pain in right ankle and joints of right foot: Secondary | ICD-10-CM | POA: Diagnosis not present

## 2020-08-09 DIAGNOSIS — F339 Major depressive disorder, recurrent, unspecified: Secondary | ICD-10-CM | POA: Diagnosis not present

## 2020-08-09 DIAGNOSIS — E7800 Pure hypercholesterolemia, unspecified: Secondary | ICD-10-CM | POA: Diagnosis not present

## 2020-08-09 DIAGNOSIS — I10 Essential (primary) hypertension: Secondary | ICD-10-CM | POA: Diagnosis not present

## 2020-08-09 DIAGNOSIS — M62561 Muscle wasting and atrophy, not elsewhere classified, right lower leg: Secondary | ICD-10-CM | POA: Diagnosis not present

## 2020-08-13 DIAGNOSIS — M25571 Pain in right ankle and joints of right foot: Secondary | ICD-10-CM | POA: Diagnosis not present

## 2020-08-13 DIAGNOSIS — E669 Obesity, unspecified: Secondary | ICD-10-CM | POA: Diagnosis not present

## 2020-08-13 DIAGNOSIS — M62561 Muscle wasting and atrophy, not elsewhere classified, right lower leg: Secondary | ICD-10-CM | POA: Diagnosis not present

## 2020-08-13 DIAGNOSIS — M25674 Stiffness of right foot, not elsewhere classified: Secondary | ICD-10-CM | POA: Diagnosis not present

## 2020-08-13 DIAGNOSIS — F339 Major depressive disorder, recurrent, unspecified: Secondary | ICD-10-CM | POA: Diagnosis not present

## 2020-08-13 DIAGNOSIS — M25671 Stiffness of right ankle, not elsewhere classified: Secondary | ICD-10-CM | POA: Diagnosis not present

## 2020-08-13 DIAGNOSIS — R269 Unspecified abnormalities of gait and mobility: Secondary | ICD-10-CM | POA: Diagnosis not present

## 2020-08-16 ENCOUNTER — Encounter: Payer: Self-pay | Admitting: Family Medicine

## 2020-08-16 ENCOUNTER — Other Ambulatory Visit: Payer: Self-pay

## 2020-08-16 ENCOUNTER — Ambulatory Visit (HOSPITAL_BASED_OUTPATIENT_CLINIC_OR_DEPARTMENT_OTHER)
Admission: RE | Admit: 2020-08-16 | Discharge: 2020-08-16 | Disposition: A | Discharge status: Home / Self Care | Payer: Medicare Other | Source: Ambulatory Visit | Attending: Family Medicine | Admitting: Family Medicine

## 2020-08-16 ENCOUNTER — Ambulatory Visit (INDEPENDENT_AMBULATORY_CARE_PROVIDER_SITE_OTHER): Payer: Medicare Other | Admitting: Family Medicine

## 2020-08-16 DIAGNOSIS — F339 Major depressive disorder, recurrent, unspecified: Secondary | ICD-10-CM | POA: Diagnosis not present

## 2020-08-16 DIAGNOSIS — K5900 Constipation, unspecified: Secondary | ICD-10-CM | POA: Diagnosis not present

## 2020-08-16 DIAGNOSIS — R748 Abnormal levels of other serum enzymes: Secondary | ICD-10-CM | POA: Diagnosis not present

## 2020-08-16 DIAGNOSIS — M21371 Foot drop, right foot: Secondary | ICD-10-CM

## 2020-08-16 DIAGNOSIS — E785 Hyperlipidemia, unspecified: Secondary | ICD-10-CM

## 2020-08-16 DIAGNOSIS — K921 Melena: Secondary | ICD-10-CM

## 2020-08-16 DIAGNOSIS — E669 Obesity, unspecified: Secondary | ICD-10-CM | POA: Diagnosis not present

## 2020-08-16 DIAGNOSIS — Z789 Other specified health status: Secondary | ICD-10-CM | POA: Diagnosis not present

## 2020-08-16 DIAGNOSIS — D509 Iron deficiency anemia, unspecified: Secondary | ICD-10-CM | POA: Diagnosis not present

## 2020-08-16 DIAGNOSIS — E119 Type 2 diabetes mellitus without complications: Secondary | ICD-10-CM | POA: Diagnosis not present

## 2020-08-16 DIAGNOSIS — R269 Unspecified abnormalities of gait and mobility: Secondary | ICD-10-CM | POA: Diagnosis not present

## 2020-08-16 DIAGNOSIS — M62561 Muscle wasting and atrophy, not elsewhere classified, right lower leg: Secondary | ICD-10-CM | POA: Diagnosis not present

## 2020-08-16 DIAGNOSIS — M25571 Pain in right ankle and joints of right foot: Secondary | ICD-10-CM | POA: Diagnosis not present

## 2020-08-16 DIAGNOSIS — R7611 Nonspecific reaction to tuberculin skin test without active tuberculosis: Secondary | ICD-10-CM | POA: Insufficient documentation

## 2020-08-16 DIAGNOSIS — M25671 Stiffness of right ankle, not elsewhere classified: Secondary | ICD-10-CM | POA: Diagnosis not present

## 2020-08-16 DIAGNOSIS — F17200 Nicotine dependence, unspecified, uncomplicated: Secondary | ICD-10-CM | POA: Diagnosis not present

## 2020-08-16 DIAGNOSIS — M25674 Stiffness of right foot, not elsewhere classified: Secondary | ICD-10-CM | POA: Diagnosis not present

## 2020-08-16 DIAGNOSIS — Z79899 Other long term (current) drug therapy: Secondary | ICD-10-CM | POA: Diagnosis not present

## 2020-08-16 DIAGNOSIS — M7731 Calcaneal spur, right foot: Secondary | ICD-10-CM | POA: Insufficient documentation

## 2020-08-16 DIAGNOSIS — M21372 Foot drop, left foot: Secondary | ICD-10-CM

## 2020-08-16 MED ORDER — BENEFIBER PO POWD
1.0000 | Freq: Two times a day (BID) | ORAL | 5 refills | Status: DC
Start: 2020-08-16 — End: 2020-08-16

## 2020-08-16 MED ORDER — BENEFIBER PO POWD
1.0000 | Freq: Two times a day (BID) | ORAL | 5 refills | Status: DC
Start: 2020-08-16 — End: 2020-11-16

## 2020-08-16 MED ORDER — POLYETHYLENE GLYCOL 3350 17 GM/SCOOP PO POWD
17.0000 g | Freq: Every day | ORAL | 1 refills | Status: DC
Start: 2020-08-16 — End: 2020-09-22

## 2020-08-16 MED ORDER — POLYETHYLENE GLYCOL 3350 17 GM/SCOOP PO POWD: 17.0000 g | Freq: Every day | ORAL | 1 refills | Status: DC

## 2020-08-16 NOTE — Assessment & Plan Note (Addendum)
No recent episodes noted and follows with gastroenterology, had a colonosopy in September of this year which was normal

## 2020-08-16 NOTE — Progress Notes (Signed)
Subjective:    Patient ID: Joseph [name], male    DOB: 1976-04-24, 44 y.o.   MRN: [id number]  Chief Complaint  Patient presents with  . Follow-up    Pt states that he feels fatigue.    HPI Patient is in today for follow up on chronic medical concerns. He denies any recent febrile illness or hospitalizations. No c/o polyuria or polydipsia. Denies CP/palp/SOB/HA/congestion/fevers/GI or GU c/o. Taking meds as prescribed. He was having trouble with right heel pain but after being referred to Dr Sherryle Lis of podiatry and receiving some ultrasound based therapies he is doing much better. He has not had any further rectal bleeding issues since he saw gastroenterology and had a colonoscopy in September but he is still only moving his bowels weekly even with the addition of Docusate bid and benefiber daily. He has started smoking 3 mini cigars daily. He has been exercising more and eating better and has lost over 10 pounds since the summer.   Past Medical History:  Diagnosis Date  . Anemia    post operative  . Antiphospholipid syndrome ([place]) 08/15/2011  . Anxiety   . Blood transfusion without reported diagnosis    x 2 per pt   . Cellulitis    bilateral thighs  . Cerumen impaction 07/13/2013  . 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 Constipation in male   . Depression   . Diabetes ([place])    no medications   . Elevated BP 10/20/2012  . Enlarged thyroid 03/07/2015  . H/O tobacco use, presenting hazards to health 12/01/2011   Quit October 2014   . Hyperlipidemia   . Lupus anticoagulant positive 08/15/2011  . Medicare annual wellness visit, subsequent 03/19/2016  . Obesity   . Schizophrenia ([place])   . Sleep apnea    wears cpap, no 02    Past Surgical History:  Procedure Laterality Date  . COLONOSCOPY    . eye surgery     b/l laser eye sx 2004  . I & D EXTREMITY     left leg  . LEG SURGERY    . TONSILLECTOMY    . WISDOM TOOTH EXTRACTION      Family History  Problem Relation Age of Onset   . Diverticulosis Father   . Stomach cancer Mother        stomach CA dx at 68  . GER disease Mother   . Cancer Mother        stomach  . Colon cancer Neg Hx   . Pancreatic cancer Neg Hx   . Esophageal cancer Neg Hx   . Colon polyps Neg Hx   . Rectal cancer Neg Hx     Social History   Socioeconomic History  . Marital status: Single    Spouse name: Not on file  . Number of children: 0  . Years of education: Not on file  . Highest education level: Not on file  Occupational History    Employer: [employer]    Comment: [employer]  Tobacco Use  . Smoking status: Current Every Day Smoker    Packs/day: 1.00    Years: 1.00    Pack years: 1.00    Types: Cigars    Start date: 07/15/2012    Last attempt to quit: 06/13/2013    Years since quitting: 7.1  . Smokeless tobacco: Former Systems developer    Types: Chew  . Tobacco comment: 4 cigars A DAY  Vaping Use  . Vaping Use: Former  Substance and Sexual Activity  . Alcohol use: No  . Drug use: No  . Sexual activity: Not Currently  Other Topics Concern  . Not on file  Social History Narrative  . Not on file   Social Determinants of Health   Financial Resource Strain:   . Difficulty of Paying Living Expenses: Not on file  Food Insecurity:   . Worried About Charity fundraiser in the Last Year: Not on file  . Ran Out of Food in the Last Year: Not on file  Transportation Needs:   . Lack of Transportation (Medical): Not on file  . Lack of Transportation (Non-Medical): Not on file  Physical Activity:   . Days of Exercise per Week: Not on file  . Minutes of Exercise per Session: Not on file  Stress:   . Feeling of Stress : Not on file  Social Connections:   . Frequency of Communication with Friends and Family: Not on file  . Frequency of Social Gatherings with Friends and Family: Not on file  . Attends Religious Services: Not on file  . Active Member of Clubs or Organizations: Not on file  . Attends Archivist  Meetings: Not on file  . Marital Status: Not on file  Intimate Partner Violence:   . Fear of Current or Ex-Partner: Not on file  . Emotionally Abused: Not on file  . Physically Abused: Not on file  . Sexually Abused: Not on file    Outpatient Medications Prior to Visit  Medication Sig Dispense Refill  . acetaminophen (TYLENOL) 325 MG tablet 1 tab po every 8 hours as needed for pain 30 tablet 0  . ASPIRIN LOW DOSE 81 MG EC tablet TAKE 1 TABLET EVERY DAY 30 tablet 11  . clonazePAM (KLONOPIN) 1 MG tablet Take 1 mg by mouth 2 (two) times daily. And As needed For anxiety    . diclofenac Sodium (VOLTAREN) 1 % GEL Apply 4 g topically 4 (four) times daily. Rub into back of right heel and along Achilles tendon 2 to 4 times daily 100 g 2  . divalproex (DEPAKOTE) 500 MG EC tablet 1 in a.m., 2 at bedtime    . docusate sodium (DOK) 100 MG capsule Take 1 capsule (100 mg total) by mouth 2 (two) times daily. 60 capsule 12  . FLUoxetine (PROZAC) 20 MG capsule Take 20 mg by mouth 2 (two) times daily.   0  . ibuprofen (ADVIL) 800 MG tablet TAKE 1 TABLET(800 MG) BY MOUTH EVERY 8 HOURS AS NEEDED 30 tablet 0  . lisinopril (ZESTRIL) 2.5 MG tablet TAKE 1 TABLET BY MOUTH DAILY 30 tablet 11  . metFORMIN (GLUCOPHAGE-XR) 500 MG 24 hr tablet SMARTSIG:1 Tablet(s) By Mouth Every Evening    . Multiple Vitamins-Minerals (MULTIVITAMIN WITH MINERALS) tablet TAKE 1 TABLET BY MOUTH EVERY DAY 100 tablet 3  . OLANZapine (ZYPREXA) 20 MG tablet Take 40 mg by mouth at bedtime. May take an extra 20mg  as needed    . Wheat Dextrin (BENEFIBER) POWD Take 1 Dose by mouth in the morning and at bedtime. Mix in 8 oz of fluids 730 g 5  . loratadine (CLARITIN) 10 MG tablet TAKE 1 TABLET BY MOUTH EVERY DAY 30 tablet 11  . divalproex (DEPAKOTE ER) 500 MG 24 hr tablet Take 500 mg by mouth 3 (three) times daily. (Patient not taking: Reported on 05/14/2020)    . fluticasone (FLONASE) 50 MCG/ACT nasal spray Place 2 sprays into both nostrils daily.  (Patient not  taking: Reported on 05/14/2020) 16 g 6   No facility-administered medications prior to visit.    Allergies  Allergen Reactions  . Haldol [Haloperidol Decanoate]     Painful, HA  . Risperidone And Related     Risperdol per pt   . Statins     ? Related to compartment syndrome    Review of Systems  Constitutional: Negative for chills, fever and malaise/fatigue.  HENT: Negative for congestion and hearing loss.   Eyes: Negative for discharge.  Respiratory: Negative for cough, sputum production and shortness of breath.   Cardiovascular: Negative for chest pain, palpitations and leg swelling.  Gastrointestinal: Positive for constipation. Negative for abdominal pain, blood in stool, diarrhea, heartburn, nausea and vomiting.  Genitourinary: Negative for dysuria, frequency, hematuria and urgency.  Musculoskeletal: Positive for joint pain. Negative for back pain, falls and myalgias.  Skin: Negative for rash.  Neurological: Negative for dizziness, sensory change, loss of consciousness, weakness and headaches.  Endo/Heme/Allergies: Negative for environmental allergies. Does not bruise/bleed easily.  Psychiatric/Behavioral: Negative for depression and suicidal ideas. The patient is not nervous/anxious and does not have insomnia.        Objective:    Physical Exam Vitals and nursing note reviewed.  Constitutional:      General: He is not in acute distress.    Appearance: He is well-developed.  HENT:     Head: Normocephalic and atraumatic.     Nose: Nose normal.  Eyes:     General:        Right eye: No discharge.        Left eye: No discharge.  Cardiovascular:     Rate and Rhythm: Normal rate and regular rhythm.     Heart sounds: No murmur heard.   Pulmonary:     Effort: Pulmonary effort is normal.     Breath sounds: Normal breath sounds.  Abdominal:     General: Bowel sounds are normal.     Palpations: Abdomen is soft.     Tenderness: There is no abdominal  tenderness.  Musculoskeletal:     Cervical back: Normal range of motion and neck supple.  Skin:    General: Skin is warm and dry.  Neurological:     Mental Status: He is alert and oriented to person, place, and time.     There were no vitals taken for this visit. Wt Readings from Last 3 Encounters:  06/07/20 274 lb (124.3 kg)  05/14/20 274 lb (124.3 kg)  04/07/20 289 lb 12.8 oz (131.5 kg)    Diabetic Foot Exam - Simple   No data filed     Lab Results  Component Value Date   WBC 6.4 02/10/2020   HGB 12.7 (L) 02/10/2020   HCT 36.6 (L) 02/10/2020   PLT 194.0 02/10/2020   GLUCOSE 97 06/04/2020   CHOL 161 06/04/2020   TRIG 154 (H) 06/04/2020   HDL 43 06/04/2020   LDLCALC 92 06/04/2020   ALT 35 06/04/2020   AST 23 06/04/2020   NA 136 06/04/2020   K 4.4 06/04/2020   CL 101 06/04/2020   CREATININE 0.93 06/04/2020   BUN 9 06/04/2020   CO2 27 06/04/2020   TSH 2.02 02/10/2020   PSA 0.31 08/05/2019   HGBA1C 6.6 (H) 02/10/2020   MICROALBUR 0.50 07/03/2013    Lab Results  Component Value Date   TSH 2.02 02/10/2020   Lab Results  Component Value Date   WBC 6.4 02/10/2020   HGB 12.7 (L) 02/10/2020  HCT 36.6 (L) 02/10/2020   MCV 96.5 02/10/2020   PLT 194.0 02/10/2020   Lab Results  Component Value Date   NA 136 06/04/2020   K 4.4 06/04/2020   CO2 27 06/04/2020   GLUCOSE 97 06/04/2020   BUN 9 06/04/2020   CREATININE 0.93 06/04/2020   BILITOT 0.4 06/04/2020   ALKPHOS 47 02/10/2020   AST 23 06/04/2020   ALT 35 06/04/2020   PROT 6.7 06/04/2020   ALBUMIN 4.1 02/10/2020   CALCIUM 9.2 06/04/2020   GFR 78.62 02/10/2020   Lab Results  Component Value Date   CHOL 161 06/04/2020   Lab Results  Component Value Date   HDL 43 06/04/2020   Lab Results  Component Value Date   LDLCALC 92 06/04/2020   Lab Results  Component Value Date   TRIG 154 (H) 06/04/2020   Lab Results  Component Value Date   CHOLHDL 3.7 06/04/2020   Lab Results  Component Value Date    HGBA1C 6.6 (H) 02/10/2020       Assessment & Plan:   Problem List Items Addressed This Visit    Iron deficiency anemia   Relevant Orders   CBC   Diabetes mellitus (HCC)    hgba1c acceptable, minimize simple carbs. Increase exercise as tolerated. Continue current meds. Had his flu shot at his group home so we will call and get it. Has had 2 COVID shots and is encouraged to take the booster at his home. Spent 45 minutes with patient managing care, ordering labs and xrays and documenting after physical exam.       Relevant Orders   TSH   Hyperlipidemia, mild    Maintain heart healthy diet such as DASH diet, increase exercise, avoid trans fats, consider a krill oil cap daily      Relevant Orders   TSH   Tobacco use disorder    Has restarted smoking 3 mini Cigars a day. Encouraged complete cessation again, discussed tactics and try to avoid smoking as much as possible      Abnormal liver enzymes    Continue to monitor and minimize simple carbohydrates in diet       Foot drop, bilateral    Continues to use AFO bilaterally, does not need his AFOs in evening      PPD positive    Lives in group home, needs annual CXR, will proceed      Relevant Orders   DG Chest 2 View   Morbid obesity ([place])    Has been trying to eat better and exercise more so he is pleased with his weight loss since this past summer.       Blood in stool    No recent episodes noted and follows with gastroenterology, had a colonosopy in September of this year which was normal      Statin intolerance    Does not tolerate statins will check lipid panel today, minimize simple carbs and processed foods      Constipation    Benefiber once a day with Docusate bid has stopped the bleeding but still only moving bowels only once a week. willincrease the Benefiber to bid and add Miralax daily. Encouraged increased hydration and fiber in diet.      Heel spur, right    His pain has recently improved with  treatments from podiatry, he sees Dr Sherryle Lis       Other Visit Diagnoses    On valproic acid therapy    -  Primary  Relevant Orders   Valproic Acid level      I have discontinued Joseph [name]'s fluticasone. I am also having him maintain his clonazePAM, divalproex, OLANZapine, FLUoxetine, multivitamin with minerals, lisinopril, loratadine, acetaminophen, docusate sodium, metFORMIN, diclofenac Sodium, ibuprofen, Aspirin Low Dose, polyethylene glycol powder, and Benefiber.  Meds ordered this encounter  Medications  . DISCONTD: polyethylene glycol powder (GLYCOLAX/MIRALAX) 17 GM/SCOOP powder    Sig: Take 17 g by mouth daily. Mix with am dose of benefiber in 6-8 ounces of liquid    Dispense:  3350 g    Refill:  1  . polyethylene glycol powder (GLYCOLAX/MIRALAX) 17 GM/SCOOP powder    Sig: Take 17 g by mouth daily. Mix with am dose of benefiber in 6-8 ounces of liquid    Dispense:  3350 g    Refill:  1  . DISCONTD: Wheat Dextrin (BENEFIBER) POWD    Sig: Take 1 Dose by mouth in the morning and at bedtime. Mix in 8 oz of fluids    Dispense:  730 g    Refill:  5  . Wheat Dextrin (BENEFIBER) POWD    Sig: Take 1 Dose by mouth in the morning and at bedtime. Mix in 8 oz of fluids    Dispense:  730 g    Refill:  5     Penni Homans, MD

## 2020-08-16 NOTE — Assessment & Plan Note (Signed)
His pain has recently improved with treatments from podiatry, he sees Dr Sherryle Lis

## 2020-08-16 NOTE — Assessment & Plan Note (Signed)
Has been trying to eat better and exercise more so he is pleased with his weight loss since this past summer.

## 2020-08-16 NOTE — Assessment & Plan Note (Addendum)
hgba1c acceptable, minimize simple carbs. Increase exercise as tolerated. Continue current meds. Had his flu shot at his group home so we will call and get it. Has had 2 COVID shots and is encouraged to take the booster at his home. Spent 45 minutes with patient managing care, ordering labs and xrays and documenting after physical exam.

## 2020-08-16 NOTE — Assessment & Plan Note (Signed)
Does not tolerate statins will check lipid panel today, minimize simple carbs and processed foods

## 2020-08-16 NOTE — Patient Instructions (Addendum)
Will consider increasing Miralax to twice daily as needed if constipation persists after increasing the benefiber and adding one dose of Miralax daily      Preventive Care 85-46 Years Old, Male Preventive care refers to lifestyle choices and visits with your health care provider that can promote health and wellness. This includes:  A yearly physical exam. This is also called an annual well check.  Regular dental and eye exams.  Immunizations.  Screening for certain conditions.  Healthy lifestyle choices, such as eating a healthy diet, getting regular exercise, not using drugs or products that contain nicotine and tobacco, and limiting alcohol use. What can I expect for my preventive care visit? Physical exam Your health care provider will check:  Height and weight. These may be used to calculate body mass index (BMI), which is a measurement that tells if you are at a healthy weight.  Heart rate and blood pressure.  Your skin for abnormal spots. Counseling Your health care provider may ask you questions about:  Alcohol, tobacco, and drug use.  Emotional well-being.  Home and relationship well-being.  Sexual activity.  Eating habits.  Work and work Statistician. What immunizations do I need?  Influenza (flu) vaccine  This is recommended every year. Tetanus, diphtheria, and pertussis (Tdap) vaccine  You may need a Td booster every 10 years. Varicella (chickenpox) vaccine  You may need this vaccine if you have not already been vaccinated. Zoster (shingles) vaccine  You may need this after age 65. Measles, mumps, and rubella (MMR) vaccine  You may need at least one dose of MMR if you were born in 1957 or later. You may also need a second dose. Pneumococcal conjugate (PCV13) vaccine  You may need this if you have certain conditions and were not previously vaccinated. Pneumococcal polysaccharide (PPSV23) vaccine  You may need one or two doses if you smoke  cigarettes or if you have certain conditions. Meningococcal conjugate (MenACWY) vaccine  You may need this if you have certain conditions. Hepatitis A vaccine  You may need this if you have certain conditions or if you travel or work in places where you may be exposed to hepatitis A. Hepatitis B vaccine  You may need this if you have certain conditions or if you travel or work in places where you may be exposed to hepatitis B. Haemophilus influenzae type b (Hib) vaccine  You may need this if you have certain risk factors. Human papillomavirus (HPV) vaccine  If recommended by your health care provider, you may need three doses over 6 months. You may receive vaccines as individual doses or as more than one vaccine together in one shot (combination vaccines). Talk with your health care provider about the risks and benefits of combination vaccines. What tests do I need? Blood tests  Lipid and cholesterol levels. These may be checked every 5 years, or more frequently if you are over 29 years old.  Hepatitis C test.  Hepatitis B test. Screening  Lung cancer screening. You may have this screening every year starting at age 12 if you have a 30-pack-year history of smoking and currently smoke or have quit within the past 15 years.  Prostate cancer screening. Recommendations will vary depending on your family history and other risks.  Colorectal cancer screening. All adults should have this screening starting at age 66 and continuing until age 44. Your health care provider may recommend screening at age 80 if you are at increased risk. You will have tests every 1-10  years, depending on your results and the type of screening test.  Diabetes screening. This is done by checking your blood sugar (glucose) after you have not eaten for a while (fasting). You may have this done every 1-3 years.  Sexually transmitted disease (STD) testing. Follow these instructions at home: Eating and  drinking  Eat a diet that includes fresh fruits and vegetables, whole grains, lean protein, and low-fat dairy products.  Take vitamin and mineral supplements as recommended by your health care provider.  Do not drink alcohol if your health care provider tells you not to drink.  If you drink alcohol: ? Limit how much you have to 0-2 drinks a day. ? Be aware of how much alcohol is in your drink. In the [place] one drink equals one 12 oz bottle of beer (355 mL), one 5 oz glass of wine (148 mL), or one 1 oz glass of hard liquor (44 mL). Lifestyle  Take daily care of your teeth and gums.  Stay active. Exercise for at least 30 minutes on 5 or more days each week.  Do not use any products that contain nicotine or tobacco, such as cigarettes, e-cigarettes, and chewing tobacco. If you need help quitting, ask your health care provider.  If you are sexually active, practice safe sex. Use a condom or other form of protection to prevent STIs (sexually transmitted infections).  Talk with your health care provider about taking a low-dose aspirin every day starting at age 16. What's next?  Go to your health care provider once a year for a well check visit.  Ask your health care provider how often you should have your eyes and teeth checked.  Stay up to date on all vaccines. This information is not intended to replace advice given to you by your health care provider. Make sure you discuss any questions you have with your health care provider. Document Revised: 09/05/2018 Document Reviewed: 09/05/2018 Elsevier Patient Education  [place].  Constipation, Adult Constipation is when a person has fewer bowel movements in a week than normal, has difficulty having a bowel movement, or has stools that are dry, hard, or larger than normal. Constipation may be caused by an underlying condition. It may become worse with age if a person takes certain medicines and does not take in enough  fluids. Follow these instructions at home: Eating and drinking   Eat foods that have a lot of fiber, such as fresh fruits and vegetables, whole grains, and beans.  Limit foods that are high in fat, low in fiber, or overly processed, such as french fries, hamburgers, cookies, candies, and soda.  Drink enough fluid to keep your urine clear or pale yellow. General instructions  Exercise regularly or as told by your health care provider.  Go to the restroom when you have the urge to go. Do not hold it in.  Take over-the-counter and prescription medicines only as told by your health care provider. These include any fiber supplements.  Practice pelvic floor retraining exercises, such as deep breathing while relaxing the lower abdomen and pelvic floor relaxation during bowel movements.  Watch your condition for any changes.  Keep all follow-up visits as told by your health care provider. This is important. Contact a health care provider if:  You have pain that gets worse.  You have a fever.  You do not have a bowel movement after 4 days.  You vomit.  You are not hungry.  You lose weight.  You are  bleeding from the anus.  You have thin, pencil-like stools. Get help right away if:  You have a fever and your symptoms suddenly get worse.  You leak stool or have blood in your stool.  Your abdomen is bloated.  You have severe pain in your abdomen.  You feel dizzy or you faint. This information is not intended to replace advice given to you by your health care provider. Make sure you discuss any questions you have with your health care provider. Document Revised: 08/24/2017 Document Reviewed: 03/01/2016 Elsevier Patient Education  2020 [employer].

## 2020-08-16 NOTE — Assessment & Plan Note (Signed)
Maintain heart healthy diet such as DASH diet, increase exercise, avoid trans fats, consider a krill oil cap daily

## 2020-08-16 NOTE — Assessment & Plan Note (Signed)
Continue to monitor and minimize simple carbohydrates in diet

## 2020-08-16 NOTE — Assessment & Plan Note (Signed)
Has restarted smoking 3 mini Cigars a day. Encouraged complete cessation again, discussed tactics and try to avoid smoking as much as possible

## 2020-08-16 NOTE — Assessment & Plan Note (Addendum)
Continues to use AFO bilaterally, does not need his AFOs in evening

## 2020-08-16 NOTE — Assessment & Plan Note (Signed)
Lives in group home, needs annual CXR, will proceed

## 2020-08-16 NOTE — Assessment & Plan Note (Signed)
Benefiber once a day with Docusate bid has stopped the bleeding but still only moving bowels only once a week. willincrease the Benefiber to bid and add Miralax daily. Encouraged increased hydration and fiber in diet.

## 2020-08-17 ENCOUNTER — Telehealth: Payer: Self-pay

## 2020-08-17 LAB — CBC
HCT: 41 % (ref 39.0–52.0)
Hemoglobin: 14 g/dL (ref 13.0–17.0)
MCHC: 34 g/dL (ref 30.0–36.0)
MCV: 97.1 fl (ref 78.0–100.0)
Platelets: 218 10*3/uL (ref 150.0–400.0)
RBC: 4.22 Mil/uL (ref 4.22–5.81)
RDW: 13 % (ref 11.5–15.5)
WBC: 8.6 10*3/uL (ref 4.0–10.5)

## 2020-08-17 LAB — VALPROIC ACID LEVEL: Valproic Acid Lvl: 56.6 mg/L (ref 50.0–100.0)

## 2020-08-17 LAB — TSH: TSH: 2.11 u[IU]/mL (ref 0.35–4.50)

## 2020-08-17 NOTE — Telephone Encounter (Signed)
Attempted to contact pt's group home to give results. LVM to call back

## 2020-09-07 ENCOUNTER — Other Ambulatory Visit: Payer: Self-pay

## 2020-09-07 ENCOUNTER — Ambulatory Visit (INDEPENDENT_AMBULATORY_CARE_PROVIDER_SITE_OTHER): Payer: Medicare Other | Admitting: *Deleted

## 2020-09-07 DIAGNOSIS — M7661 Achilles tendinitis, right leg: Secondary | ICD-10-CM

## 2020-09-07 DIAGNOSIS — B351 Tinea unguium: Secondary | ICD-10-CM

## 2020-09-07 NOTE — Progress Notes (Signed)
Patient presents for the 3rd EPAT treatment today with complaint of plantar and posterior heel pain right. Diagnosed with plantar fasciitis/achilles tendonitis by Dr. Sherryle Lis. This has been ongoing for several months. The patient has tried ice, stretching, NSAIDS and supportive shoe gear with no long term relief.   Most of the pain is very pinpoint to the central plantar heel.  He says he's had zero pain for the last 2 days. He is very happy with his progress.  ESWT administered and tolerated well.Treatment settings initiated at:   Energy: 30  Ended treatment session today with 3000 shocks at the following settings:   Energy: 30  Frequency: 4.0  Joules: 29.33   Reviewed post EPAT instructions. Advised to avoid ice and NSAIDs throughout the treatment process and to utilize boot or supportive shoes for at least the next 3 days.  Follow up with Dr. Sherryle Lis is already scheduled for next week.

## 2020-09-08 DIAGNOSIS — Z23 Encounter for immunization: Secondary | ICD-10-CM | POA: Diagnosis not present

## 2020-09-13 ENCOUNTER — Ambulatory Visit: Payer: Medicare Other | Admitting: Podiatry

## 2020-09-20 DIAGNOSIS — F200 Paranoid schizophrenia: Secondary | ICD-10-CM | POA: Diagnosis not present

## 2020-09-22 ENCOUNTER — Other Ambulatory Visit: Payer: Self-pay | Admitting: Family Medicine

## 2020-09-27 ENCOUNTER — Encounter: Payer: Self-pay | Admitting: Podiatry

## 2020-09-27 ENCOUNTER — Ambulatory Visit (INDEPENDENT_AMBULATORY_CARE_PROVIDER_SITE_OTHER): Payer: Medicare Other | Admitting: Podiatry

## 2020-09-27 ENCOUNTER — Other Ambulatory Visit: Payer: Self-pay

## 2020-09-27 DIAGNOSIS — M7661 Achilles tendinitis, right leg: Secondary | ICD-10-CM

## 2020-09-27 DIAGNOSIS — M216X1 Other acquired deformities of right foot: Secondary | ICD-10-CM

## 2020-09-27 NOTE — Progress Notes (Signed)
  Subjective:  Patient ID: Joseph [name], male    DOB: January 19, 1976,  MRN: [id number]  Chief Complaint  Patient presents with  . Tendonitis    "Its doing much better"    45 y.o. male returns with the above complaint. History confirmed with patient. He is nearly pain free at this point. EPAT was very helpful for him, he did 3 sessions.  Objective:  Physical Exam: warm, good capillary refill, no trophic changes or ulcerative lesions, normal DP and PT pulses and normal sensory exam. 0-5 muscle strength in dorsiflexion bilateral ankle. Trace contracture of the EHL noted.   Right Foot: minimal pain on palpation to the posterior heel. Gastrosoleus equinus is present. 5/5 strength w/o pain in PF     Assessment:   No diagnosis found.   Plan:  Patient was evaluated and treated and all questions answered.   -Continue stretching exercises and RICE protocol as needed -He did very well after EPAT treatment. At this point I will discharge him from care, return PRN. If pain returns he should resume stretching, icing, and anti-inflammatories.   Sharl Ma, DPM 09/27/2020     Return if symptoms worsen or fail to improve.

## 2020-10-05 ENCOUNTER — Other Ambulatory Visit: Payer: Self-pay | Admitting: Family Medicine

## 2020-11-10 ENCOUNTER — Other Ambulatory Visit: Payer: Self-pay | Admitting: Family Medicine

## 2020-11-16 ENCOUNTER — Other Ambulatory Visit: Payer: Self-pay | Admitting: Family Medicine

## 2020-12-18 ENCOUNTER — Other Ambulatory Visit: Payer: Self-pay | Admitting: Family Medicine

## 2020-12-20 ENCOUNTER — Other Ambulatory Visit: Payer: Self-pay | Admitting: Family Medicine

## 2021-01-10 ENCOUNTER — Other Ambulatory Visit: Payer: Self-pay | Admitting: Family Medicine

## 2021-01-24 ENCOUNTER — Other Ambulatory Visit: Payer: Self-pay | Admitting: Family Medicine

## 2021-01-24 ENCOUNTER — Encounter: Payer: Self-pay | Admitting: Family Medicine

## 2021-01-24 ENCOUNTER — Ambulatory Visit (INDEPENDENT_AMBULATORY_CARE_PROVIDER_SITE_OTHER): Payer: Medicare Other | Admitting: Family Medicine

## 2021-01-24 ENCOUNTER — Other Ambulatory Visit: Payer: Self-pay

## 2021-01-24 VITALS — BP 118/70 | HR 95 | Temp 98.0°F | Resp 18 | Wt 268.0 lb

## 2021-01-24 DIAGNOSIS — F17200 Nicotine dependence, unspecified, uncomplicated: Secondary | ICD-10-CM | POA: Diagnosis not present

## 2021-01-24 DIAGNOSIS — Z789 Other specified health status: Secondary | ICD-10-CM | POA: Diagnosis not present

## 2021-01-24 DIAGNOSIS — M21372 Foot drop, left foot: Secondary | ICD-10-CM | POA: Diagnosis not present

## 2021-01-24 DIAGNOSIS — E785 Hyperlipidemia, unspecified: Secondary | ICD-10-CM | POA: Diagnosis not present

## 2021-01-24 DIAGNOSIS — M21371 Foot drop, right foot: Secondary | ICD-10-CM | POA: Diagnosis not present

## 2021-01-24 DIAGNOSIS — R748 Abnormal levels of other serum enzymes: Secondary | ICD-10-CM

## 2021-01-24 DIAGNOSIS — M79A29 Nontraumatic compartment syndrome of unspecified lower extremity: Secondary | ICD-10-CM

## 2021-01-24 DIAGNOSIS — K5900 Constipation, unspecified: Secondary | ICD-10-CM

## 2021-01-24 DIAGNOSIS — E119 Type 2 diabetes mellitus without complications: Secondary | ICD-10-CM | POA: Diagnosis not present

## 2021-01-24 DIAGNOSIS — E049 Nontoxic goiter, unspecified: Secondary | ICD-10-CM | POA: Diagnosis not present

## 2021-01-24 DIAGNOSIS — D509 Iron deficiency anemia, unspecified: Secondary | ICD-10-CM

## 2021-01-24 LAB — COMPREHENSIVE METABOLIC PANEL
ALT: 30 U/L (ref 0–53)
AST: 21 U/L (ref 0–37)
Albumin: 4.3 g/dL (ref 3.5–5.2)
Alkaline Phosphatase: 55 U/L (ref 39–117)
BUN: 10 mg/dL (ref 6–23)
CO2: 29 mEq/L (ref 19–32)
Calcium: 9.7 mg/dL (ref 8.4–10.5)
Chloride: 103 mEq/L (ref 96–112)
Creatinine, Ser: 0.83 mg/dL (ref 0.40–1.50)
GFR: 104.61 mL/min (ref >60.00)
Glucose, Bld: 74 mg/dL (ref 70–99)
Potassium: 4.4 mEq/L (ref 3.5–5.1)
Sodium: 139 mEq/L (ref 135–145)
Total Bilirubin: 0.3 mg/dL (ref 0.2–1.2)
Total Protein: 6.7 g/dL (ref 6.0–8.3)

## 2021-01-24 LAB — HEMOGLOBIN A1C: Hgb A1c MFr Bld: 6.9 % — ABNORMAL HIGH (ref 4.6–6.5)

## 2021-01-24 LAB — LIPID PANEL
Cholesterol: 173 mg/dL (ref 0–200)
HDL: 44.6 mg/dL (ref >39.00)
LDL Cholesterol: 99 mg/dL (ref 0–99)
NonHDL: 128.1
Total CHOL/HDL Ratio: 4
Triglycerides: 147 mg/dL (ref 0.0–149.0)
VLDL: 29.4 mg/dL (ref 0.0–40.0)

## 2021-01-24 LAB — CBC
HCT: 42.8 % (ref 39.0–52.0)
Hemoglobin: 14.5 g/dL (ref 13.0–17.0)
MCHC: 33.9 g/dL (ref 30.0–36.0)
MCV: 97.7 fl (ref 78.0–100.0)
Platelets: 238 10*3/uL (ref 150.0–400.0)
RBC: 4.38 Mil/uL (ref 4.22–5.81)
RDW: 12.8 % (ref 11.5–15.5)
WBC: 6.6 10*3/uL (ref 4.0–10.5)

## 2021-01-24 LAB — TSH: TSH: 1.87 u[IU]/mL (ref 0.35–4.50)

## 2021-01-24 NOTE — Patient Instructions (Signed)

## 2021-01-24 NOTE — Assessment & Plan Note (Signed)
Unfortunately is smoking about 4 cigars a day now. He is encouraged to try complete cessation to decrease risk of heart disease and more.

## 2021-01-24 NOTE — Assessment & Plan Note (Signed)
hgba1c acceptable, minimize simple carbs. Increase exercise as tolerated. Continue current meds 

## 2021-01-24 NOTE — Assessment & Plan Note (Addendum)
He has a rigid anterior Tib panel AFO on left and right both are broken broken and is making it increasingly difficult and is causing skin irritation and pain especially on the left. He is a long term bilateral AFO wearer and has received great benefit and improvement in ambulation with these devices. He is able to maintain his mobility and stay active and healthy as a result. He is anxious to proceed with getting a new AFO so he can stay active without pain. The right AFO has a fractured strut and broken medial wings and the left AFO is missing the lateral wings and the anterior panel is causing skin breakdown as previously mentioned. He is referred back to the [facility] clinic for evaluation and construction of new AFOs. His AFOs are broken beyond repair bilaterally with fractures than can cause harm and injury. He needs new braces to maintain an active lifestyle and gait pattern

## 2021-01-24 NOTE — Progress Notes (Signed)
Subjective:    Patient ID: Joseph [name], male    DOB: 05/28/1976, 45 y.o.   MRN: [id number]  Chief Complaint  Patient presents with  . leg brace     Pt states that leg is hurting his leg.    HPI Patient is in today for evaluation of his broken AFOs and follow up on chronic medical concerns including hyperlipidemia and more. He feels well today but is having increasing difficulties with his AFOs especially the one on the left. He has a rigid anterior Tib panel AFO on left and right both are broken broken and is making it increasingly difficult and is causing skin irritation and pain especially on the left. He is a long term bilateral AFO wearer and has received great benefit and improvement in ambulation with these devices. He is working, trying to stay active and eat well. Unfortunately he is smoking about 4 cigars a day. Denies CP/palp/SOB/HA/congestion/fevers/GI or GU c/o. Taking meds as prescribed  Past Medical History:  Diagnosis Date  . Anemia    post operative  . Antiphospholipid syndrome (HCC) 08/15/2011  . Anxiety   . Blood transfusion without reported diagnosis    x 2 per pt   . Cellulitis    bilateral thighs  . Cerumen impaction 07/13/2013  . 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HCC)   . Constipation in male   . Depression   . Diabetes (HCC)    no medications   . Elevated BP 10/20/2012  . Enlarged thyroid 03/07/2015  . H/O tobacco use, presenting hazards to health 12/01/2011   Quit October 2014   . Hyperlipidemia   . Lupus anticoagulant positive 08/15/2011  . Medicare annual wellness visit, subsequent 03/19/2016  . Obesity   . Schizophrenia (HCC)   . Sleep apnea    wears cpap, no 02    Past Surgical History:  Procedure Laterality Date  . COLONOSCOPY    . eye surgery     b/l laser eye sx 2004  . I & D EXTREMITY     left leg  . LEG SURGERY    . TONSILLECTOMY    . WISDOM TOOTH EXTRACTION      Family History  Problem Relation Age of Onset  . Diverticulosis Father   .  Stomach cancer Mother        stomach CA dx at 59  . GER disease Mother   . Cancer Mother        stomach  . Colon cancer Neg Hx   . Pancreatic cancer Neg Hx   . Esophageal cancer Neg Hx   . Colon polyps Neg Hx   . Rectal cancer Neg Hx     Social History   Socioeconomic History  . Marital status: Single    Spouse name: Not on file  . Number of children: 0  . Years of education: Not on file  . Highest education level: Not on file  Occupational History    Employer: [employer]    Comment: [employer]  Tobacco Use  . Smoking status: Current Every Day Smoker    Packs/day: 1.00    Years: 1.00    Pack years: 1.00    Types: Cigars    Start date: 07/15/2012    Last attempt to quit: 06/13/2013    Years since quitting: 7.6  . Smokeless tobacco: Former Neurosurgeon    Types: Chew  . Tobacco comment: 4 cigars A DAY  Vaping Use  . Vaping Use: Former  Substance and Sexual Activity  . Alcohol use: No  . Drug use: No  . Sexual activity: Not Currently  Other Topics Concern  . Not on file  Social History Narrative  . Not on file   Social Determinants of Health   Financial Resource Strain: Not on file  Food Insecurity: Not on file  Transportation Needs: Not on file  Physical Activity: Not on file  Stress: Not on file  Social Connections: Not on file  Intimate Partner Violence: Not on file    Outpatient Medications Prior to Visit  Medication Sig Dispense Refill  . acetaminophen (TYLENOL) 325 MG tablet 1 tab po every 8 hours as needed for pain 30 tablet 0  . ASPIRIN LOW DOSE 81 MG EC tablet TAKE 1 TABLET EVERY DAY 30 tablet 11  . clonazePAM (KLONOPIN) 1 MG tablet Take 1 mg by mouth 2 (two) times daily. And As needed For anxiety    . diclofenac Sodium (VOLTAREN) 1 % GEL Apply 4 g topically 4 (four) times daily. Rub into back of right heel and along Achilles tendon 2 to 4 times daily 100 g 2  . divalproex (DEPAKOTE) 500 MG EC tablet 1 in a.m., 2 at bedtime    . docusate sodium  (DOK) 100 MG capsule Take 1 capsule (100 mg total) by mouth 2 (two) times daily. 60 capsule 12  . FLUoxetine (PROZAC) 20 MG capsule Take 20 mg by mouth 2 (two) times daily.   0  . ibuprofen (ADVIL) 800 MG tablet TAKE 1 TABLET(800 MG) BY MOUTH EVERY 8 HOURS AS NEEDED 30 tablet 0  . lisinopril (ZESTRIL) 2.5 MG tablet TAKE 1 TABLET BY MOUTH DAILY 30 tablet 11  . loratadine (CLARITIN) 10 MG tablet TAKE 1 TABLET BY MOUTH EVERY DAY 30 tablet 11  . metFORMIN (GLUCOPHAGE-XR) 500 MG 24 hr tablet SMARTSIG:1 Tablet(s) By Mouth Every Evening    . Multiple Vitamins-Minerals (MULTIVITAMIN MEN) TABS Take 1 tablet by mouth daily. 90 tablet 3  . OLANZapine (ZYPREXA) 20 MG tablet Take 40 mg by mouth at bedtime. May take an extra 20mg  as needed    . polyethylene glycol powder (GLYCOLAX/MIRALAX) 17 GM/SCOOP powder TAKE 17 GRAMS DAILY. MIX WITH AM DOSE OF BENEFIBER IN 6-8 OZ OF LIQUID 238 g 1  . Wheat Dextrin (BENEFIBER) POWD TAKE 1 DOSE BY MOUTH IN THE AM& IN THE PM. MIX IN 8OZ OF FLUIDS 475 g 3   No facility-administered medications prior to visit.    Allergies  Allergen Reactions  . Haldol [Haloperidol Decanoate]     Painful, HA  . Risperidone And Related     Risperdol per pt   . Statins     ? Related to compartment syndrome    Review of Systems  Constitutional: Negative for fever and malaise/fatigue.  HENT: Negative for congestion.   Eyes: Negative for blurred vision.  Respiratory: Negative for shortness of breath.   Cardiovascular: Negative for chest pain, palpitations and leg swelling.  Gastrointestinal: Negative for abdominal pain, blood in stool and nausea.  Genitourinary: Negative for dysuria and frequency.  Musculoskeletal: Positive for joint pain and myalgias. Negative for falls.  Skin: Positive for rash.  Neurological: Negative for dizziness, loss of consciousness and headaches.  Endo/Heme/Allergies: Negative for environmental allergies.  Psychiatric/Behavioral: Negative for depression.  The patient is not nervous/anxious.        Objective:    Physical Exam Vitals and nursing note reviewed.  Constitutional:      General: He is not  in acute distress.    Appearance: He is well-developed.  HENT:     Head: Normocephalic and atraumatic.     Nose: Nose normal.  Eyes:     General:        Right eye: No discharge.        Left eye: No discharge.  Cardiovascular:     Rate and Rhythm: Normal rate and regular rhythm.     Heart sounds: No murmur heard.   Pulmonary:     Effort: Pulmonary effort is normal.     Breath sounds: Normal breath sounds.  Abdominal:     General: Bowel sounds are normal.     Palpations: Abdomen is soft.     Tenderness: There is no abdominal tenderness.  Musculoskeletal:     Cervical back: Normal range of motion and neck supple.     Right lower leg: No edema.     Left lower leg: No edema.     Comments: Bilateral AFOs lower legs. Skin over left calf bandaged.   Skin:    General: Skin is warm and dry.  Neurological:     Mental Status: He is alert and oriented to person, place, and time.     BP 118/70   Pulse 95   Temp 98 F (36.7 C)   Resp 18   Wt 268 lb (121.6 kg)   SpO2 96%   BMI 34.41 kg/m  Wt Readings from Last 3 Encounters:  01/24/21 268 lb (121.6 kg)  06/07/20 274 lb (124.3 kg)  05/14/20 274 lb (124.3 kg)    Diabetic Foot Exam - Simple   No data filed    Lab Results  Component Value Date   WBC 8.6 08/16/2020   HGB 14.0 08/16/2020   HCT 41.0 08/16/2020   PLT 218.0 08/16/2020   GLUCOSE 97 06/04/2020   CHOL 161 06/04/2020   TRIG 154 (H) 06/04/2020   HDL 43 06/04/2020   LDLCALC 92 06/04/2020   ALT 35 06/04/2020   AST 23 06/04/2020   NA 136 06/04/2020   K 4.4 06/04/2020   CL 101 06/04/2020   CREATININE 0.93 06/04/2020   BUN 9 06/04/2020   CO2 27 06/04/2020   TSH 2.11 08/16/2020   PSA 0.31 08/05/2019   HGBA1C 6.6 (H) 02/10/2020   MICROALBUR 0.50 07/03/2013    Lab Results  Component Value Date   TSH 2.11  08/16/2020   Lab Results  Component Value Date   WBC 8.6 08/16/2020   HGB 14.0 08/16/2020   HCT 41.0 08/16/2020   MCV 97.1 08/16/2020   PLT 218.0 08/16/2020   Lab Results  Component Value Date   NA 136 06/04/2020   K 4.4 06/04/2020   CO2 27 06/04/2020   GLUCOSE 97 06/04/2020   BUN 9 06/04/2020   CREATININE 0.93 06/04/2020   BILITOT 0.4 06/04/2020   ALKPHOS 47 02/10/2020   AST 23 06/04/2020   ALT 35 06/04/2020   PROT 6.7 06/04/2020   ALBUMIN 4.1 02/10/2020   CALCIUM 9.2 06/04/2020   GFR 78.62 02/10/2020   Lab Results  Component Value Date   CHOL 161 06/04/2020   Lab Results  Component Value Date   HDL 43 06/04/2020   Lab Results  Component Value Date   LDLCALC 92 06/04/2020   Lab Results  Component Value Date   TRIG 154 (H) 06/04/2020   Lab Results  Component Value Date   CHOLHDL 3.7 06/04/2020   Lab Results  Component Value Date   HGBA1C  6.6 (H) 02/10/2020       Assessment & Plan:   Problem List Items Addressed This Visit    Iron deficiency anemia - Primary   Relevant Orders   CBC   Diabetes mellitus ([place])    hgba1c acceptable, minimize simple carbs. Increase exercise as tolerated. Continue current meds      Relevant Orders   Hemoglobin A1c   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HCC)   Hyperlipidemia, mild   Relevant Orders   Lipid panel   Tobacco use disorder    Unfortunately is smoking about 4 cigars a day now. He is encouraged to try complete cessation to decrease risk of heart disease and more.       Abnormal liver enzymes   Relevant Orders   Comprehensive metabolic panel   Foot drop, bilateral    He has a rigid anterior Tib panel AFO on left and right both are broken broken and is making it increasingly difficult and is causing skin irritation and pain especially on the left. He is a long term bilateral AFO wearer and has received great benefit and improvement in ambulation with these devices. He is able to maintain his mobility and stay active and  healthy as a result. He is anxious to proceed with getting a new AFO so he can stay active without pain. The right AFO has a fractured strut and broken medial wings and the left AFO is missing the lateral wings and the anterior panel is causing skin breakdown as previously mentioned. He is referred back to the [facility] clinic for evaluation and construction of new AFOs.      Enlarged thyroid   Relevant Orders   TSH   Statin intolerance    He actually developed compartment syndrome requiring surgical intervention      Constipation    Improved with the addition of fiber powder and stool softener encouraged to stay well hydrated and stay busy         I am having Louann Liv [name] maintain his clonazePAM, divalproex, OLANZapine, FLUoxetine, lisinopril, loratadine, acetaminophen, docusate sodium, metFORMIN, diclofenac Sodium, ibuprofen, Aspirin Low Dose, Multivitamin Men, polyethylene glycol powder, and Benefiber.  No orders of the defined types were placed in this encounter.    Penni Homans, MD

## 2021-01-24 NOTE — Assessment & Plan Note (Signed)
He actually developed compartment syndrome requiring surgical intervention

## 2021-01-24 NOTE — Assessment & Plan Note (Signed)
Improved with the addition of fiber powder and stool softener encouraged to stay well hydrated and stay busy

## 2021-01-31 ENCOUNTER — Other Ambulatory Visit: Payer: Self-pay | Admitting: Family Medicine

## 2021-01-31 DIAGNOSIS — E119 Type 2 diabetes mellitus without complications: Secondary | ICD-10-CM | POA: Diagnosis not present

## 2021-02-07 DIAGNOSIS — E7800 Pure hypercholesterolemia, unspecified: Secondary | ICD-10-CM | POA: Diagnosis not present

## 2021-02-07 DIAGNOSIS — E119 Type 2 diabetes mellitus without complications: Secondary | ICD-10-CM | POA: Diagnosis not present

## 2021-02-09 ENCOUNTER — Other Ambulatory Visit: Payer: Self-pay | Admitting: Family Medicine

## 2021-02-11 ENCOUNTER — Other Ambulatory Visit: Payer: Self-pay

## 2021-02-11 NOTE — Telephone Encounter (Signed)
error 

## 2021-02-14 ENCOUNTER — Ambulatory Visit: Payer: Medicare Other | Admitting: Family Medicine

## 2021-03-10 ENCOUNTER — Other Ambulatory Visit: Payer: Self-pay | Admitting: Family Medicine

## 2021-03-17 DIAGNOSIS — F200 Paranoid schizophrenia: Secondary | ICD-10-CM | POA: Diagnosis not present

## 2021-03-23 ENCOUNTER — Other Ambulatory Visit: Payer: Self-pay

## 2021-03-23 ENCOUNTER — Encounter: Payer: Self-pay | Admitting: Family

## 2021-03-23 ENCOUNTER — Ambulatory Visit (INDEPENDENT_AMBULATORY_CARE_PROVIDER_SITE_OTHER): Payer: Medicare Other | Admitting: Family

## 2021-03-23 VITALS — BP 122/60 | HR 91 | Temp 98.2°F | Ht 73.0 in | Wt 281.6 lb

## 2021-03-23 DIAGNOSIS — H6123 Impacted cerumen, bilateral: Secondary | ICD-10-CM

## 2021-03-23 NOTE — Progress Notes (Signed)
Joseph [name] is a 45 y.o. 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   Abnormal liver enzymes 01/14/2012   Hyperlipidemia, mild 12/01/2011   OSA (obstructive sleep apnea) 12/01/2011   Tobacco use disorder 12/01/2011   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[place]) 11/12/2011   Schizoaffective disorder ([place]) 11/04/2011   Diabetes mellitus ([place]) 11/04/2011   Iron deficiency anemia 08/15/2011   Lupus anticoagulant positive 08/15/2011    Current Outpatient Medications  Medication Sig Dispense Refill   acetaminophen (TYLENOL) 325 MG tablet 1 tab po every 8 hours as needed for pain 30 tablet 0   ASPIRIN LOW DOSE 81 MG EC tablet TAKE 1 TABLET EVERY DAY 30 tablet 11   clonazePAM (KLONOPIN) 1 MG tablet Take 1 mg by mouth 2 (two) times daily. And As needed For anxiety     diclofenac Sodium (VOLTAREN) 1 % GEL Apply 4 g topically 4 (four) times daily. Rub into back of right heel and along Achilles tendon 2 to 4 times daily 100 g 2   divalproex (DEPAKOTE) 500 MG EC tablet 1 in a.m., 2 at bedtime     docusate sodium (DOK) 100 MG capsule Take 1 capsule (100 mg total) by mouth 2 (two) times daily. 60 capsule 12   FLUoxetine (PROZAC) 20 MG capsule Take 20 mg by mouth 2 (two) times daily.   0   ibuprofen (ADVIL) 800 MG tablet TAKE 1 TABLET(800 MG) BY MOUTH EVERY 8 HOURS AS NEEDED 30 tablet 0   lisinopril (ZESTRIL) 2.5 MG tablet Take 1 tablet (2.5 mg total) by mouth daily. 90 tablet 1    loratadine (CLARITIN) 10 MG tablet Take 1 tablet (10 mg total) by mouth daily. 90 tablet 1   metFORMIN (GLUCOPHAGE-XR) 500 MG 24 hr tablet SMARTSIG:1 Tablet(s) By Mouth Every Evening     Multiple Vitamins-Minerals (MULTIVITAMIN MEN) TABS Take 1 tablet by mouth daily. 90 tablet 3   OLANZapine (ZYPREXA) 20 MG tablet Take 40 mg by mouth at bedtime. May take an extra 20mg  as needed     polyethylene glycol powder (GLYCOLAX/MIRALAX) 17 GM/SCOOP powder Take 17 g by mouth daily. 255 g 5   Wheat Dextrin (BENEFIBER) POWD Take 1 Dose by mouth in the morning and at bedtime. 475 g 5   No current facility-administered medications for this visit.    Allergies: Haldol [haloperidol decanoate], Risperidone and related, and Statins  Past Medical History:  Diagnosis Date   Anemia    post operative   Antiphospholipid syndrome ([place]) 08/15/2011   Anxiety    Blood transfusion without reported diagnosis    x 2 per pt    Cellulitis    bilateral thighs   Cerumen impaction 07/13/2013   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[place])    Constipation in male    Depression    Diabetes ([place])    no medications    Elevated BP 10/20/2012   Enlarged thyroid 03/07/2015   H/O tobacco use, presenting hazards to health 12/01/2011  Quit October 2014    Hyperlipidemia    Lupus anticoagulant positive 08/15/2011   Medicare annual wellness visit, subsequent 03/19/2016   Obesity    Schizophrenia ([place])    Sleep apnea    wears cpap, no 02    Past Surgical History:  Procedure Laterality Date   COLONOSCOPY     eye surgery     b/l laser eye sx 2004   I & D EXTREMITY     left leg   LEG SURGERY     TONSILLECTOMY     WISDOM TOOTH EXTRACTION      Family History  Problem Relation Age of Onset   Diverticulosis Father    Stomach cancer Mother        stomach CA dx at 52   GER disease Mother    Cancer Mother        stomach   Colon cancer Neg Hx    Pancreatic cancer Neg Hx    Esophageal cancer Neg Hx    Colon polyps Neg Hx    Rectal cancer  Neg Hx     Social History   Tobacco Use   Smoking status: Every Day    Packs/day: 1.00    Years: 1.00    Pack years: 1.00    Types: Cigars, Cigarettes    Start date: 07/15/2012    Last attempt to quit: 06/13/2013    Years since quitting: 7.7   Smokeless tobacco: Former    Types: Chew   Tobacco comments:    4 cigars A DAY  Substance Use Topics   Alcohol use: No    Subjective:  Patient is requesting ear lavage; does need ear lavage done regularly;  notes that hearing does seem like it is done;   Objective:  Vitals:   03/23/21 1314  BP: 122/60  Pulse: 91  Temp: 98.2 F (36.8 C)  TempSrc: Oral  SpO2: 96%  Weight: 281 lb 9.6 oz (127.7 kg)  Height: 6\' 1"  (1.854 m)    General: Well developed, well nourished, in no acute distress  Skin : Warm and dry.  Head: Normocephalic and atraumatic  Eyes: Sclera and conjunctiva clear; pupils round and reactive to light; extraocular movements intact  Ears: External normal; after lavage, canals clear; tympanic membranes normal  Oropharynx: Pink, supple. No suspicious lesions  Neck: Supple without thyromegaly, adenopathy  Lungs: Respirations unlabored;  Neurologic: Alert and oriented; speech intact; face symmetrical;   1. Bilateral impacted cerumen     Plan:  Ear lavage completed with no difficulty;  he noted improvement in hearing;  Keep planned follow up with his PCP for 04/2021;  This visit occurred during the SARS-CoV-2 public health emergency.  Safety protocols were in place, including screening questions prior to the visit, additional usage of staff PPE, and extensive cleaning of exam room while observing appropriate contact time as indicated for disinfecting solutions.    No follow-ups on file.  No orders of the defined types were placed in this encounter.   Requested Prescriptions    No prescriptions requested or ordered in this encounter

## 2021-04-26 ENCOUNTER — Other Ambulatory Visit: Payer: Self-pay | Admitting: Family Medicine

## 2021-05-16 ENCOUNTER — Other Ambulatory Visit: Payer: Self-pay

## 2021-05-16 ENCOUNTER — Ambulatory Visit (INDEPENDENT_AMBULATORY_CARE_PROVIDER_SITE_OTHER): Payer: Medicare Other | Admitting: Family Medicine

## 2021-05-16 ENCOUNTER — Telehealth: Payer: Self-pay | Admitting: Family Medicine

## 2021-05-16 ENCOUNTER — Other Ambulatory Visit: Payer: Self-pay | Admitting: Family Medicine

## 2021-05-16 ENCOUNTER — Encounter: Payer: Self-pay | Admitting: Family Medicine

## 2021-05-16 ENCOUNTER — Other Ambulatory Visit (HOSPITAL_BASED_OUTPATIENT_CLINIC_OR_DEPARTMENT_OTHER): Payer: Self-pay

## 2021-05-16 VITALS — BP 114/72 | HR 79 | Temp 97.9°F | Resp 16 | Wt 287.2 lb

## 2021-05-16 DIAGNOSIS — F259 Schizoaffective disorder, unspecified: Secondary | ICD-10-CM

## 2021-05-16 DIAGNOSIS — Z593 Problems related to living in residential institution: Secondary | ICD-10-CM | POA: Diagnosis not present

## 2021-05-16 DIAGNOSIS — D509 Iron deficiency anemia, unspecified: Secondary | ICD-10-CM | POA: Diagnosis not present

## 2021-05-16 DIAGNOSIS — R5383 Other fatigue: Secondary | ICD-10-CM

## 2021-05-16 DIAGNOSIS — F17200 Nicotine dependence, unspecified, uncomplicated: Secondary | ICD-10-CM

## 2021-05-16 DIAGNOSIS — Z227 Latent tuberculosis: Secondary | ICD-10-CM

## 2021-05-16 DIAGNOSIS — G4730 Sleep apnea, unspecified: Secondary | ICD-10-CM | POA: Diagnosis not present

## 2021-05-16 DIAGNOSIS — H6123 Impacted cerumen, bilateral: Secondary | ICD-10-CM | POA: Diagnosis not present

## 2021-05-16 DIAGNOSIS — E785 Hyperlipidemia, unspecified: Secondary | ICD-10-CM | POA: Diagnosis not present

## 2021-05-16 DIAGNOSIS — E119 Type 2 diabetes mellitus without complications: Secondary | ICD-10-CM

## 2021-05-16 DIAGNOSIS — Z789 Other specified health status: Secondary | ICD-10-CM

## 2021-05-16 DIAGNOSIS — Z23 Encounter for immunization: Secondary | ICD-10-CM

## 2021-05-16 DIAGNOSIS — K5900 Constipation, unspecified: Secondary | ICD-10-CM

## 2021-05-16 LAB — CBC WITH DIFFERENTIAL/PLATELET
Basophils Absolute: 0.1 10*3/uL (ref 0.0–0.1)
Basophils Relative: 0.9 % (ref 0.0–3.0)
Eosinophils Absolute: 0.4 10*3/uL (ref 0.0–0.7)
Eosinophils Relative: 5 % (ref 0.0–5.0)
HCT: 41.4 % (ref 39.0–52.0)
Hemoglobin: 13.9 g/dL (ref 13.0–17.0)
Lymphocytes Relative: 39.8 % (ref 12.0–46.0)
Lymphs Abs: 2.8 10*3/uL (ref 0.7–4.0)
MCHC: 33.6 g/dL (ref 30.0–36.0)
MCV: 97.6 fl (ref 78.0–100.0)
Monocytes Absolute: 0.7 10*3/uL (ref 0.1–1.0)
Monocytes Relative: 9.3 % (ref 3.0–12.0)
Neutro Abs: 3.2 10*3/uL (ref 1.4–7.7)
Neutrophils Relative %: 45 % (ref 43.0–77.0)
Platelets: 211 10*3/uL (ref 150.0–400.0)
RBC: 4.25 Mil/uL (ref 4.22–5.81)
RDW: 12.6 % (ref 11.5–15.5)
WBC: 7.1 10*3/uL (ref 4.0–10.5)

## 2021-05-16 LAB — LIPID PANEL
Cholesterol: 179 mg/dL (ref 0–200)
HDL: 39.6 mg/dL (ref >39.00)
NonHDL: 139.54
Total CHOL/HDL Ratio: 5
Triglycerides: 329 mg/dL — ABNORMAL HIGH (ref 0.0–149.0)
VLDL: 65.8 mg/dL — ABNORMAL HIGH (ref 0.0–40.0)

## 2021-05-16 LAB — COMPREHENSIVE METABOLIC PANEL
ALT: 24 U/L (ref 0–53)
AST: 22 U/L (ref 0–37)
Albumin: 4 g/dL (ref 3.5–5.2)
Alkaline Phosphatase: 45 U/L (ref 39–117)
BUN: 9 mg/dL (ref 6–23)
CO2: 28 mEq/L (ref 19–32)
Calcium: 9 mg/dL (ref 8.4–10.5)
Chloride: 99 mEq/L (ref 96–112)
Creatinine, Ser: 0.84 mg/dL (ref 0.40–1.50)
GFR: 104.01 mL/min (ref >60.00)
Glucose, Bld: 75 mg/dL (ref 70–99)
Potassium: 4.3 mEq/L (ref 3.5–5.1)
Sodium: 136 mEq/L (ref 135–145)
Total Bilirubin: 0.3 mg/dL (ref 0.2–1.2)
Total Protein: 6.6 g/dL (ref 6.0–8.3)

## 2021-05-16 LAB — LDL CHOLESTEROL, DIRECT: Direct LDL: 98 mg/dL

## 2021-05-16 LAB — TSH: TSH: 2.17 u[IU]/mL (ref 0.35–5.50)

## 2021-05-16 LAB — HEMOGLOBIN A1C: Hgb A1c MFr Bld: 6.6 % — ABNORMAL HIGH (ref 4.6–6.5)

## 2021-05-16 MED ORDER — SHINGRIX 50 MCG/0.5ML IM SUSR
INTRAMUSCULAR | 0 refills | Status: DC
  Filled 2021-05-16: qty 1, 1d supply, fill #0

## 2021-05-16 NOTE — Assessment & Plan Note (Signed)
Encouraged complete cessation. Discussed need to quit as relates to risk of numerous cancers, cardiac and pulmonary disease as well as neurologic complications. Counseled for greater than 3 minutes. Has not smoked for several days. Was smoking roughly 3 mini cigars daily

## 2021-05-16 NOTE — Assessment & Plan Note (Signed)
Suffered compartment syndrome on statins

## 2021-05-16 NOTE — Telephone Encounter (Signed)
Patient states the group home he is living at is needing a referral to Cardio

## 2021-05-16 NOTE — Patient Instructions (Addendum)
Shingrix is the new shingles shot, 2 shots over 2-6 months, confirm coverage with insurance and document, then can return here for shots with nurse appt or at pharmacy  Continue using the fiber powder twice daily  Tobacco Use Disorder Tobacco use disorder (TUD) occurs when a person craves, seeks, and uses tobacco, regardless of the consequences. This disorder can cause problems with mental and physical health. It can affect your ability to have healthy relationships, and it can keep you from meeting your responsibilities at [facility], or school. Tobacco may be: Smoked as a cigarette or cigar. Inhaled using e-cigarettes. Smoked in a pipe or hookah. Chewed as smokeless tobacco. Inhaled into the nostrils as snuff. Tobacco products contain a dangerous chemical called nicotine, which is very addictive. Nicotine triggers hormones that make the body feel stimulated and works on areas of the brain that make you feel good. These effects can make ithard for people to quit nicotine. Tobacco contains many other unsafe chemicals that can damage almost every organ in the body. Smoking tobacco also puts others in danger due to fire risk andpossible health problems caused by breathing in secondhand smoke. What are the signs or symptoms? Symptoms of TUD may include: Being unable to slow down or stop your tobacco use. Spending an abnormal amount of time getting or using tobacco. Craving tobacco. Cravings may last for up to 6 months after quitting. Tobacco use that: Interferes with your work, school, or home life. Interferes with your personal and social relationships. Makes you give up activities that you once enjoyed or found important. Using tobacco even though you know that it is: Dangerous or bad for your health or someone else's health. Causing problems in your life. Needing more and more of the substance to get the same effect (developing tolerance). Experiencing unpleasant symptoms if you do not use  the substance (withdrawal). Withdrawal symptoms may include: Depressed, anxious, or irritable mood. Difficulty concentrating. Increased appetite. Restlessness or trouble sleeping. Using the substance to avoid withdrawal. How is this diagnosed? This condition may be diagnosed based on: Your current and past tobacco use. Your health care provider may ask questions about how your tobacco use affects your life. A physical exam. You may be diagnosed with TUD if you have at least two symptoms within a69-monthperiod. How is this treated? This condition is treated by stopping tobacco use. Many people are unable to quit on their own and need help. Treatment may include: Nicotine replacement therapy (NRT). NRT provides nicotine without the other harmful chemicals in tobacco. NRT gradually lowers the dosage of nicotine in the body and reduces withdrawal symptoms. NRT is available as: Over-the-counter gums, lozenges, and skin patches. Prescription mouth inhalers and nasal sprays. Medicine that acts on the brain to reduce cravings and withdrawal symptoms. A type of talk therapy that examines your triggers for tobacco use, how to avoid them, and how to cope with cravings (behavioral therapy). Hypnosis. This may help with withdrawal symptoms. Joining a support group for others coping with TUD. The best treatment for TUD is usually a combination of medicine, talk therapy, and support groups. Recovery can be a long process. Many people start using tobacco again after stopping (relapse). If you relapse, it does not mean that treatment will not work. Follow these instructions at home:  Lifestyle Do not use any products that contain nicotine or tobacco, such as cigarettes and e-cigarettes. Avoid things that trigger tobacco use as much as you can. Triggers include people and situations that usually cause  you to use tobacco. Avoid drinks that contain caffeine, including coffee. These may worsen some withdrawal  symptoms. Find ways to manage stress. Wanting to smoke may cause stress, and stress can make you want to smoke. Relaxation techniques such as deep breathing, meditation, and yoga may help. Attend support groups as needed. These groups are an important part of long-term recovery for many people. General instructions Take over-the-counter and prescription medicines only as told by your health care provider. Check with your health care provider before taking any new prescription or over-the-counter medicines. Decide on a friend, family member, or smoking quit-line (such as 1-800-QUIT-NOW in the [place] that you can call or text when you feel the urge to smoke or when you need help coping with cravings. Keep all follow-up visits as told by your health care provider and therapist. This is important. Contact a health care provider if: You are not able to take your medicines as prescribed. Your symptoms get worse, even with treatment. Summary Tobacco use disorder (TUD) occurs when a person craves, seeks, and uses tobacco regardless of the consequences. This condition may be diagnosed based on your current and past tobacco use and a physical exam. Many people are unable to quit on their own and need help. Recovery can be a long process. The most effective treatment for TUD is usually a combination of medicine, talk therapy, and support groups. This information is not intended to replace advice given to you by your health care provider. Make sure you discuss any questions you have with your healthcare provider. Document Revised: 08/03/2020 Document Reviewed: 08/03/2020 Elsevier Patient Education  2022 [employer].

## 2021-05-16 NOTE — Assessment & Plan Note (Signed)
Doing well on current meds and at group home

## 2021-05-16 NOTE — Progress Notes (Addendum)
Subjective:   By signing my name below, I, Zite Okoli, attest that this documentation has been prepared under the direction and in the presence of Mosie Lukes, MD. 05/16/2021   Patient ID: Joseph [name], male    DOB: [birth year], 45 y.o.   MRN: [id number]  Chief Complaint  Patient presents with   6 months f/u    HPI Patient is in today for an office visit.  He had his ears flushed on 03/23/2021 due to impacted cerumen in both ears and mentions he has been feeling good.  He is interested in getting a TB skin draw and the shingles vaccine.  He has been told to schedule a cardiologist appointment because he was not sleeping properly. However, he mentions he has not been feeling well-rested when he wakes up. He feels very tired and reports also snoring. He had a CPAP machine but mentions it broke 2 months ago and has been trying to get a new one.  He has been trying to quit smoking but smoked one cigar this morning to manage his headache. He used to smoke about 3 cigars a day but has not smoked for the past few days.  He has been managing his constipation with Benefiber 2x daily and is doing very well on it.  He denies chest pain, shortness of breath and palpitations.  He is starting a new job tomorrow and will be working from 8 am - 12 pm.   Past Medical History:  Diagnosis Date   Anemia    post operative   Antiphospholipid syndrome ([place]) 08/15/2011   Anxiety    Blood transfusion without reported diagnosis    x 2 per pt    Cellulitis    bilateral thighs   Cerumen impaction 07/13/2013   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[place])    Constipation in male    Depression    Diabetes ([place])    no medications    Elevated BP 10/20/2012   Enlarged thyroid 03/07/2015   H/O tobacco use, presenting hazards to health 12/01/2011   Quit October 2014    Hyperlipidemia    Lupus anticoagulant positive 08/15/2011   Medicare annual wellness visit, subsequent 03/19/2016   Obesity    Schizophrenia  ([place])    Sleep apnea    wears cpap, no 02    Past Surgical History:  Procedure Laterality Date   COLONOSCOPY     eye surgery     b/l laser eye sx 2004   I & D EXTREMITY     left leg   LEG SURGERY     TONSILLECTOMY     WISDOM TOOTH EXTRACTION      Family History  Problem Relation Age of Onset   Diverticulosis Father    Stomach cancer Mother        stomach CA dx at 67   GER disease Mother    Cancer Mother        stomach   Colon cancer Neg Hx    Pancreatic cancer Neg Hx    Esophageal cancer Neg Hx    Colon polyps Neg Hx    Rectal cancer Neg Hx     Social History   Socioeconomic History   Marital status: Single    Spouse name: Not on file   Number of children: 0   Years of education: Not on file   Highest education level: Not on file  Occupational History    Employer: [employer]  Comment: [employer]  Tobacco Use   Smoking status: Every Day    Packs/day: 1.00    Years: 1.00    Pack years: 1.00    Types: Cigars, Cigarettes    Start date: 07/15/2012    Last attempt to quit: 06/13/2013    Years since quitting: 7.9   Smokeless tobacco: Former    Types: Chew   Tobacco comments:    4 cigars A DAY  Vaping Use   Vaping Use: Former  Substance and Sexual Activity   Alcohol use: No   Drug use: No   Sexual activity: Not Currently  Other Topics Concern   Not on file  Social History Narrative   Not on file   Social Determinants of Health   Financial Resource Strain: Not on file  Food Insecurity: Not on file  Transportation Needs: Not on file  Physical Activity: Not on file  Stress: Not on file  Social Connections: Not on file  Intimate Partner Violence: Not on file    Outpatient Medications Prior to Visit  Medication Sig Dispense Refill   acetaminophen (TYLENOL) 325 MG tablet 1 tab po every 8 hours as needed for pain 30 tablet 0   clonazePAM (KLONOPIN) 1 MG tablet Take 1 mg by mouth 2 (two) times daily. And As needed For anxiety     diclofenac  Sodium (VOLTAREN) 1 % GEL Apply 4 g topically 4 (four) times daily. Rub into back of right heel and along Achilles tendon 2 to 4 times daily 100 g 2   divalproex (DEPAKOTE) 500 MG EC tablet 1 in a.m., 2 at bedtime     docusate sodium (COLACE) 100 MG capsule TAKE 1 CAPSULE(100 MG) BY MOUTH TWICE DAILY 60 capsule 12   FLUoxetine (PROZAC) 20 MG capsule Take 20 mg by mouth 2 (two) times daily.   0   ibuprofen (ADVIL) 800 MG tablet TAKE 1 TABLET(800 MG) BY MOUTH EVERY 8 HOURS AS NEEDED 30 tablet 0   lisinopril (ZESTRIL) 2.5 MG tablet Take 1 tablet (2.5 mg total) by mouth daily. 90 tablet 1   loratadine (CLARITIN) 10 MG tablet Take 1 tablet (10 mg total) by mouth daily. 90 tablet 1   LYBALVI 20-10 MG TABS Take 1 tablet by mouth at bedtime.     metFORMIN (GLUCOPHAGE-XR) 500 MG 24 hr tablet SMARTSIG:1 Tablet(s) By Mouth Every Evening     Multiple Vitamins-Minerals (MULTIVITAMIN MEN) TABS Take 1 tablet by mouth daily. 90 tablet 3   Wheat Dextrin (BENEFIBER) POWD Take 1 Dose by mouth in the morning and at bedtime. 475 g 5   ASPIRIN LOW DOSE 81 MG EC tablet TAKE 1 TABLET EVERY DAY 30 tablet 11   polyethylene glycol powder (GLYCOLAX/MIRALAX) 17 GM/SCOOP powder Take 17 g by mouth daily. 255 g 5   OLANZapine (ZYPREXA) 20 MG tablet Take 40 mg by mouth at bedtime. May take an extra '20mg'$  as needed     No facility-administered medications prior to visit.    Allergies  Allergen Reactions   Haldol [Haloperidol Decanoate]     Painful, HA   Risperidone And Related     Risperdol per pt    Statins     ? Related to compartment syndrome    Review of Systems  Constitutional:  Positive for malaise/fatigue. Negative for fever.  HENT:  Negative for congestion.   Eyes:  Negative for redness.  Respiratory:  Negative for shortness of breath.   Cardiovascular:  Negative for chest pain, palpitations and leg  swelling.  Gastrointestinal:  Negative for abdominal pain, blood in stool and nausea.  Genitourinary:   Negative for dysuria and frequency.  Musculoskeletal:  Negative for falls.  Skin:  Negative for rash.  Neurological:  Positive for headaches. Negative for dizziness and loss of consciousness.  Endo/Heme/Allergies:  Negative for polydipsia.  Psychiatric/Behavioral:  Negative for depression. The patient is not nervous/anxious.       Objective:    Physical Exam Constitutional:      Appearance: Normal appearance. He is not ill-appearing.  HENT:     Head: Normocephalic and atraumatic.     Right Ear: Tympanic membrane, ear canal and external ear normal.     Left Ear: Tympanic membrane, ear canal and external ear normal.  Eyes:     Conjunctiva/sclera: Conjunctivae normal.  Cardiovascular:     Rate and Rhythm: Normal rate and regular rhythm.     Heart sounds: Normal heart sounds. No murmur heard. Pulmonary:     Breath sounds: Normal breath sounds. No wheezing.  Abdominal:     General: Bowel sounds are normal. There is no distension.     Palpations: Abdomen is soft.     Tenderness: There is no abdominal tenderness.     Hernia: No hernia is present.  Musculoskeletal:     Cervical back: Neck supple.  Lymphadenopathy:     Cervical: No cervical adenopathy.  Skin:    General: Skin is warm and dry.  Neurological:     Mental Status: He is alert and oriented to person, place, and time.  Psychiatric:        Behavior: Behavior normal.    BP 114/72   Pulse 79   Temp 97.9 F (36.6 C)   Resp 16   Wt 287 lb 3.2 oz (130.3 kg)   SpO2 98%   BMI 37.89 kg/m  Wt Readings from Last 3 Encounters:  05/16/21 287 lb 3.2 oz (130.3 kg)  03/23/21 281 lb 9.6 oz (127.7 kg)  01/24/21 268 lb (121.6 kg)    Diabetic Foot Exam - Simple   No data filed    Lab Results  Component Value Date   WBC 7.1 05/16/2021   HGB 13.9 05/16/2021   HCT 41.4 05/16/2021   PLT 211.0 05/16/2021   GLUCOSE 75 05/16/2021   CHOL 179 05/16/2021   TRIG 329.0 (H) 05/16/2021   HDL 39.60 05/16/2021   LDLDIRECT 98.0  05/16/2021   LDLCALC 99 01/24/2021   ALT 24 05/16/2021   AST 22 05/16/2021   NA 136 05/16/2021   K 4.3 05/16/2021   CL 99 05/16/2021   CREATININE 0.84 05/16/2021   BUN 9 05/16/2021   CO2 28 05/16/2021   TSH 2.17 05/16/2021   PSA 0.31 08/05/2019   HGBA1C 6.6 (H) 05/16/2021   MICROALBUR 0.50 07/03/2013    Lab Results  Component Value Date   TSH 2.17 05/16/2021   Lab Results  Component Value Date   WBC 7.1 05/16/2021   HGB 13.9 05/16/2021   HCT 41.4 05/16/2021   MCV 97.6 05/16/2021   PLT 211.0 05/16/2021   Lab Results  Component Value Date   NA 136 05/16/2021   K 4.3 05/16/2021   CO2 28 05/16/2021   GLUCOSE 75 05/16/2021   BUN 9 05/16/2021   CREATININE 0.84 05/16/2021   BILITOT 0.3 05/16/2021   ALKPHOS 45 05/16/2021   AST 22 05/16/2021   ALT 24 05/16/2021   PROT 6.6 05/16/2021   ALBUMIN 4.0 05/16/2021   CALCIUM 9.0 05/16/2021  GFR 104.01 05/16/2021   Lab Results  Component Value Date   CHOL 179 05/16/2021   Lab Results  Component Value Date   HDL 39.60 05/16/2021   Lab Results  Component Value Date   LDLCALC 99 01/24/2021   Lab Results  Component Value Date   TRIG 329.0 (H) 05/16/2021   Lab Results  Component Value Date   CHOLHDL 5 05/16/2021   Lab Results  Component Value Date   HGBA1C 6.6 (H) 05/16/2021       Assessment & Plan:   Problem List Items Addressed This Visit     Iron deficiency anemia   Relevant Orders   CBC with Differential/Platelet (Completed)   Comprehensive metabolic panel (Completed)   TSH (Completed)   Lipid panel (Completed)   Schizoaffective disorder ([place])    Doing well on current meds and at group home      Diabetes mellitus ([place]) - Primary    hgba1c acceptable, minimize simple carbs. Increase exercise as tolerated. Continue current meds. Follows with Dr Chalmers Cater      Relevant Orders   Hemoglobin A1c (Completed)   EKG 12-Lead (Completed)   Hyperlipidemia, mild    Encourage heart healthy diet such as MIND or  DASH diet, increase exercise, avoid trans fats, simple carbohydrates and processed foods, consider a krill or fish or flaxseed oil cap daily.       Relevant Orders   CBC with Differential/Platelet (Completed)   Comprehensive metabolic panel (Completed)   TSH (Completed)   Lipid panel (Completed)   EKG 12-Lead (Completed)   Tobacco use disorder    Encouraged complete cessation. Discussed need to quit as relates to risk of numerous cancers, cardiac and pulmonary disease as well as neurologic complications. Counseled for greater than 3 minutes. Has not smoked for several days. Was smoking roughly 3 mini cigars daily      Cerumen impaction    Recently disimpacted doing well now      Statin intolerance    Suffered compartment syndrome on statins      Constipation    Improved with Benefiber bid and encouraged adequate hydration      TB lung, latent    Asymptomatic continue to monitor      Other Visit Diagnoses     Sleep apnea, unspecified type       Relevant Orders   Ambulatory referral to Pulmonology   Lives in group home       Relevant Orders   QuantiFERON-TB Gold Plus (Completed)   EKG 12-Lead (Completed)   Need for shingles vaccine       Relevant Orders   Varicella-zoster vaccine subcutaneous       No orders of the defined types were placed in this encounter.   I,Zite Okoli,acting as a Education administrator for Penni Homans, MD.,have documented all relevant documentation on the behalf of Penni Homans, MD,as directed by  Penni Homans, MD while in the presence of Penni Homans, MD.   I, Mosie Lukes, MD., personally preformed the services described in this documentation.  All medical record entries made by the scribe were at my direction and in my presence.  I have reviewed the chart and discharge instructions (if applicable) and agree that the record reflects my personal performance and is accurate and complete. 05/16/2021

## 2021-05-16 NOTE — Assessment & Plan Note (Signed)
Encourage heart healthy diet such as MIND or DASH diet, increase exercise, avoid trans fats, simple carbohydrates and processed foods, consider a krill or fish or flaxseed oil cap daily.  °

## 2021-05-16 NOTE — Assessment & Plan Note (Signed)
Improved with Benefiber bid and encouraged adequate hydration

## 2021-05-16 NOTE — Assessment & Plan Note (Signed)
hgba1c acceptable, minimize simple carbs. Increase exercise as tolerated. Continue current meds. Follows with Dr Chalmers Cater

## 2021-05-16 NOTE — Assessment & Plan Note (Signed)
Recently disimpacted doing well now

## 2021-05-17 ENCOUNTER — Other Ambulatory Visit: Payer: Self-pay | Admitting: Family Medicine

## 2021-05-18 LAB — EXTRA SPECIMEN

## 2021-05-18 LAB — QUANTIFERON-TB GOLD PLUS

## 2021-05-19 ENCOUNTER — Other Ambulatory Visit: Payer: Self-pay

## 2021-05-19 ENCOUNTER — Other Ambulatory Visit (INDEPENDENT_AMBULATORY_CARE_PROVIDER_SITE_OTHER): Payer: Medicare Other

## 2021-05-19 DIAGNOSIS — Z593 Problems related to living in residential institution: Secondary | ICD-10-CM | POA: Diagnosis not present

## 2021-05-19 DIAGNOSIS — Z227 Latent tuberculosis: Secondary | ICD-10-CM | POA: Diagnosis not present

## 2021-05-23 DIAGNOSIS — D649 Anemia, unspecified: Secondary | ICD-10-CM | POA: Insufficient documentation

## 2021-05-23 DIAGNOSIS — F32A Depression, unspecified: Secondary | ICD-10-CM | POA: Insufficient documentation

## 2021-05-23 DIAGNOSIS — L0390 Cellulitis, unspecified: Secondary | ICD-10-CM | POA: Insufficient documentation

## 2021-05-24 LAB — QUANTIFERON-TB GOLD PLUS
Mitogen-NIL: >10 IU/mL
NIL: 0.09 IU/mL
QuantiFERON-TB Gold Plus: POSITIVE — AB
TB1-NIL: 0.41 IU/mL
TB2-NIL: 0.32 IU/mL

## 2021-06-01 ENCOUNTER — Ambulatory Visit: Payer: Medicare Other | Admitting: Cardiology

## 2021-06-02 DIAGNOSIS — Z227 Latent tuberculosis: Secondary | ICD-10-CM | POA: Insufficient documentation

## 2021-06-02 NOTE — Assessment & Plan Note (Signed)
Asymptomatic continue to monitor

## 2021-06-03 NOTE — Progress Notes (Signed)
Tried calling preferred number again and they stated he was on vacation.  Tried calling cell and had to leave message to call back.

## 2021-06-04 ENCOUNTER — Ambulatory Visit (INDEPENDENT_AMBULATORY_CARE_PROVIDER_SITE_OTHER): Payer: Medicare Other

## 2021-06-04 VITALS — Ht 73.0 in | Wt 287.0 lb

## 2021-06-04 DIAGNOSIS — Z0000 Encounter for general adult medical examination without abnormal findings: Secondary | ICD-10-CM | POA: Diagnosis not present

## 2021-06-04 DIAGNOSIS — E119 Type 2 diabetes mellitus without complications: Secondary | ICD-10-CM | POA: Insufficient documentation

## 2021-06-04 DIAGNOSIS — E663 Overweight: Secondary | ICD-10-CM | POA: Insufficient documentation

## 2021-06-04 NOTE — Patient Instructions (Signed)
[name] , Thank you for taking time to come for your Medicare Wellness Visit. I appreciate your ongoing commitment to your health goals. Please review the following plan we discussed and let me know if I can assist you in the future.   Screening recommendations/referrals: Colonoscopy: Done 06/07/2020 - Repeat in 10 years Recommended yearly ophthalmology/optometry visit for glaucoma screening and checkup Recommended yearly dental visit for hygiene and checkup  Vaccinations: Influenza vaccine: Done 05/16/2020 - Repeat annually Pneumococcal vaccine: Done 11/04/2011 - due for Prevnar Tdap vaccine: Done 03/21/2018 - Repeat in 10 years Shingles vaccine: Due after age 15   Covid-19: Done 11/17/19, 12/16/19, & 09/08/20  Advanced directives: Please bring a copy of your health care power of attorney and living will to the office to be added to your chart at your convenience.   Conditions/risks identified: Aim for 30 minutes of exercise or brisk walking each day, drink 6-8 glasses of water and eat lots of fruits and vegetables.   Next appointment: Follow up in one year for your annual wellness visit   Preventive Care 40-64 Years, Male Preventive care refers to lifestyle choices and visits with your health care provider that can promote health and wellness. What does preventive care include? A yearly physical exam. This is also called an annual well check. Dental exams once or twice a year. Routine eye exams. Ask your health care provider how often you should have your eyes checked. Personal lifestyle choices, including: Daily care of your teeth and gums. Regular physical activity. Eating a healthy diet. Avoiding tobacco and drug use. Limiting alcohol use. Practicing safe sex. Taking low-dose aspirin every day starting at age 23. What happens during an annual well check? The services and screenings done by your health care provider during your annual well check will depend on your age, overall  health, lifestyle risk factors, and family history of disease. Counseling  Your health care provider may ask you questions about your: Alcohol use. Tobacco use. Drug use. Emotional well-being. Home and relationship well-being. Sexual activity. Eating habits. Work and work Statistician. Screening  You may have the following tests or measurements: Height, weight, and BMI. Blood pressure. Lipid and cholesterol levels. These may be checked every 5 years, or more frequently if you are over 80 years old. Skin check. Lung cancer screening. You may have this screening every year starting at age 12 if you have a 30-pack-year history of smoking and currently smoke or have quit within the past 15 years. Fecal occult blood test (FOBT) of the stool. You may have this test every year starting at age 55. Flexible sigmoidoscopy or colonoscopy. You may have a sigmoidoscopy every 5 years or a colonoscopy every 10 years starting at age 57. Prostate cancer screening. Recommendations will vary depending on your family history and other risks. Hepatitis C blood test. Hepatitis B blood test. Sexually transmitted disease (STD) testing. Diabetes screening. This is done by checking your blood sugar (glucose) after you have not eaten for a while (fasting). You may have this done every 1-3 years. Discuss your test results, treatment options, and if necessary, the need for more tests with your health care provider. Vaccines  Your health care provider may recommend certain vaccines, such as: Influenza vaccine. This is recommended every year. Tetanus, diphtheria, and acellular pertussis (Tdap, Td) vaccine. You may need a Td booster every 10 years. Zoster vaccine. You may need this after age 71. Pneumococcal 13-valent conjugate (PCV13) vaccine. You may need this if you have  certain conditions and have not been vaccinated. Pneumococcal polysaccharide (PPSV23) vaccine. You may need one or two doses if you smoke  cigarettes or if you have certain conditions. Talk to your health care provider about which screenings and vaccines you need and how often you need them. This information is not intended to replace advice given to you by your health care provider. Make sure you discuss any questions you have with your health care provider. Document Released: 10/08/2015 Document Revised: 05/31/2016 Document Reviewed: 07/13/2015 Elsevier Interactive Patient Education  2017 [place] Prevention in the Home Falls can cause injuries. They can happen to people of all ages. There are many things you can do to make your home safe and to help prevent falls. What can I do on the outside of my home? Regularly fix the edges of walkways and driveways and fix any cracks. Remove anything that might make you trip as you walk through a door, such as a raised step or threshold. Trim any bushes or trees on the path to your home. Use bright outdoor lighting. Clear any walking paths of anything that might make someone trip, such as rocks or tools. Regularly check to see if handrails are loose or broken. Make sure that both sides of any steps have handrails. Any raised decks and porches should have guardrails on the edges. Have any leaves, snow, or ice cleared regularly. Use sand or salt on walking paths during winter. Clean up any spills in your garage right away. This includes oil or grease spills. What can I do in the bathroom? Use night lights. Install grab bars by the toilet and in the tub and shower. Do not use towel bars as grab bars. Use non-skid mats or decals in the tub or shower. If you need to sit down in the shower, use a plastic, non-slip stool. Keep the floor dry. Clean up any water that spills on the floor as soon as it happens. Remove soap buildup in the tub or shower regularly. Attach bath mats securely with double-sided non-slip rug tape. Do not have throw rugs and other things on the floor that  can make you trip. What can I do in the bedroom? Use night lights. Make sure that you have a light by your bed that is easy to reach. Do not use any sheets or blankets that are too big for your bed. They should not hang down onto the floor. Have a firm chair that has side arms. You can use this for support while you get dressed. Do not have throw rugs and other things on the floor that can make you trip. What can I do in the kitchen? Clean up any spills right away. Avoid walking on wet floors. Keep items that you use a lot in easy-to-reach places. If you need to reach something above you, use a strong step stool that has a grab bar. Keep electrical cords out of the way. Do not use floor polish or wax that makes floors slippery. If you must use wax, use non-skid floor wax. Do not have throw rugs and other things on the floor that can make you trip. What can I do with my stairs? Do not leave any items on the stairs. Make sure that there are handrails on both sides of the stairs and use them. Fix handrails that are broken or loose. Make sure that handrails are as long as the stairways. Check any carpeting to make sure that it is firmly attached  to the stairs. Fix any carpet that is loose or worn. Avoid having throw rugs at the top or bottom of the stairs. If you do have throw rugs, attach them to the floor with carpet tape. Make sure that you have a light switch at the top of the stairs and the bottom of the stairs. If you do not have them, ask someone to add them for you. What else can I do to help prevent falls? Wear shoes that: Do not have high heels. Have rubber bottoms. Are comfortable and fit you well. Are closed at the toe. Do not wear sandals. If you use a stepladder: Make sure that it is fully opened. Do not climb a closed stepladder. Make sure that both sides of the stepladder are locked into place. Ask someone to hold it for you, if possible. Clearly mark and make sure that you  can see: Any grab bars or handrails. First and last steps. Where the edge of each step is. Use tools that help you move around (mobility aids) if they are needed. These include: Canes. Walkers. Scooters. Crutches. Turn on the lights when you go into a dark area. Replace any light bulbs as soon as they burn out. Set up your furniture so you have a clear path. Avoid moving your furniture around. If any of your floors are uneven, fix them. If there are any pets around you, be aware of where they are. Review your medicines with your doctor. Some medicines can make you feel dizzy. This can increase your chance of falling. Ask your doctor what other things that you can do to help prevent falls. This information is not intended to replace advice given to you by your health care provider. Make sure you discuss any questions you have with your health care provider. Document Released: 07/08/2009 Document Revised: 02/17/2016 Document Reviewed: 10/16/2014 Elsevier Interactive Patient Education  2017 [employer].

## 2021-06-04 NOTE — Progress Notes (Addendum)
Subjective:   Joseph [name] is a 45 y.o. male who presents for Medicare Annual/Subsequent preventive examination.  Virtual Visit via Telephone Note  I connected with  Louann Liv [name] on 06/04/21 at 12:00 PM EDT by telephone and verified that I am speaking with the correct person using two identifiers.  Location: Patient: Home Provider: [facility] Persons participating in the virtual visit: patient/Nurse Health Advisor   I discussed the limitations, risks, security and privacy concerns of performing an evaluation and management service by telephone and the availability of in person appointments. The patient expressed understanding and agreed to proceed.  Interactive audio and video telecommunications were attempted between this nurse and patient, however failed, due to patient having technical difficulties OR patient did not have access to video capability.  We continued and completed visit with audio only.  Some vital signs may be absent or patient reported.   Sydnei Ohaver E Naoki Migliaccio, LPN   Review of Systems     Cardiac Risk Factors include: advanced age (>50mn, >>44women);obesity (BMI >30kg/m2);male gender;diabetes mellitus;dyslipidemia     Objective:    Today's Vitals   06/04/21 1209  Weight: 287 lb (130.2 kg)  Height: '6\' 1"'$  (1.854 m)   Body mass index is 37.87 kg/m.  Advanced Directives 06/04/2021 08/05/2019 03/19/2018 03/19/2016 06/22/2015 06/22/2015 06/14/2015  Does Patient Have a Medical Advance Directive? Yes No No No No No No  Type of AParamedicof [place] will - - - - - -  Copy of [place] Chart? No - copy requested - - - - - -  Would patient like information on creating a medical advance directive? - No - Patient declined Yes (MAU/Ambulatory/Procedural Areas - Information given) Yes - Educational materials given No - patient declined information No - patient declined information -  Pre-existing out of facility DNR  order (yellow form or pink MOST form) - - - - - - -    Current Medications (verified) Outpatient Encounter Medications as of 06/04/2021  Medication Sig   acetaminophen (TYLENOL) 325 MG tablet 1 tab po every 8 hours as needed for pain   aspirin 81 MG EC tablet TAKE 1 TABLET BY MOUTH EVERY DAY   clonazePAM (KLONOPIN) 1 MG tablet Take 1 mg by mouth 2 (two) times daily. And As needed For anxiety   divalproex (DEPAKOTE ER) 500 MG 24 hr tablet Take 500 mg by mouth 3 (three) times daily.   docusate sodium (COLACE) 100 MG capsule TAKE 1 CAPSULE(100 MG) BY MOUTH TWICE DAILY   FLUoxetine (PROZAC) 20 MG capsule Take 20 mg by mouth 2 (two) times daily.    ibuprofen (ADVIL) 800 MG tablet TAKE 1 TABLET(800 MG) BY MOUTH EVERY 8 HOURS AS NEEDED   Lancets (ONETOUCH DELICA PLUS [id number] MISC daily.   lisinopril (ZESTRIL) 2.5 MG tablet Take 1 tablet (2.5 mg total) by mouth daily.   loratadine (CLARITIN) 10 MG tablet Take 1 tablet (10 mg total) by mouth daily.   LYBALVI 20-10 MG TABS Take 1 tablet by mouth at bedtime.   metFORMIN (GLUCOPHAGE-XR) 500 MG 24 hr tablet 1 tablet with evening meal   Multiple Vitamins-Minerals (MULTIVITAMIN MEN) TABS Take 1 tablet by mouth daily.   OLANZapine (ZYPREXA) 2.5 MG tablet Take 2.5 mg by mouth at bedtime.   OneTouch Delica Lancets [id number] daily.   ONETOUCH VERIO test strip daily.   Wheat Dextrin (BENEFIBER) POWD Take 1 Dose by mouth in the morning and at bedtime.  diclofenac Sodium (VOLTAREN) 1 % GEL Apply 4 g topically 4 (four) times daily. Rub into back of right heel and along Achilles tendon 2 to 4 times daily (Patient not taking: Reported on 06/04/2021)   polyethylene glycol powder (GLYCOLAX/MIRALAX) 17 GM/SCOOP powder TAKE 17 GRAMS BY MOUTH DAILY (Patient not taking: Reported on 06/04/2021)   PREVIDENT 0.2 % SOLN Take by mouth. (Patient not taking: Reported on 06/04/2021)   [DISCONTINUED] divalproex (DEPAKOTE) 500 MG EC tablet 1 in a.m., 2 at bedtime   [DISCONTINUED]  metFORMIN (GLUCOPHAGE-XR) 500 MG 24 hr tablet SMARTSIG:1 Tablet(s) By Mouth Every Evening   No facility-administered encounter medications on file as of 06/04/2021.    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[place])    Constipation in male    Depression    Diabetes ([place])    no medications    Elevated BP 10/20/2012   Enlarged thyroid 03/07/2015   H/O tobacco use, presenting hazards to health 12/01/2011   Quit October 2014    Hyperlipidemia    Lupus anticoagulant positive 08/15/2011   Medicare annual wellness visit, subsequent 03/19/2016   Obesity    Schizophrenia ([place])    Sleep apnea    wears cpap, no 02   Past Surgical History:  Procedure Laterality Date   COLONOSCOPY     eye surgery     b/l laser eye sx 2004   I & D EXTREMITY     left leg   LEG SURGERY     TONSILLECTOMY     WISDOM TOOTH EXTRACTION     Family History  Problem Relation Age of Onset   Diverticulosis Father    Stomach cancer Mother        stomach CA dx at 79   GER disease Mother    Cancer Mother        stomach   Colon cancer Neg Hx    Pancreatic cancer Neg Hx    Esophageal cancer Neg Hx    Colon polyps Neg Hx    Rectal cancer Neg Hx    Social History   Socioeconomic History   Marital status: Single    Spouse name: Not on file   Number of children: 0   Years of education: Not on file   Highest education level: Not on file  Occupational History    Employer: [employer]    Comment: [employer]  Tobacco Use   Smoking status: Former    Packs/day: 1.00    Years: 1.00    Pack years: 1.00    Types: Cigars, Cigarettes    Start date: 07/15/2012     Quit date: 04/25/2021    Years since quitting: 0.1   Smokeless tobacco: Former    Types: Chew   Tobacco comments:    4 cigars A DAY  Vaping Use   Vaping Use: Every day  Substance and Sexual Activity   Alcohol use: No   Drug use: No   Sexual activity: Not Currently  Other Topics Concern   Not on file  Social History Narrative   Lives in a [place] - his dad pays  for it - Visits with Dad every Saturday and Sunday   Social Determinants of Health   Financial Resource Strain: Low Risk    Difficulty of Paying Living Expenses: Not hard at all  Food Insecurity: No Food Insecurity   Worried About Charity fundraiser in the Last Year: Never true   Arboriculturist in the Last Year: Never true  Transportation Needs: No Transportation Needs   Lack of Transportation (Medical): No   Lack of Transportation (Non-Medical): No  Physical Activity: Sufficiently Active   Days of Exercise per Week: 7 days   Minutes of Exercise per Session: 60 min  Stress: No Stress Concern Present   Feeling of Stress : Not at all  Social Connections: Moderately Integrated   Frequency of Communication with Friends and Family: More than three times a week   Frequency of Social Gatherings with Friends and Family: More than three times a week   Attends Religious Services: More than 4 times per year   Active Member of [employer] or Organizations: Yes   Attends Music therapist: More than 4 times per year   Marital Status: Never married    Tobacco Counseling Counseling given: Not Answered Tobacco comments: 4 cigars A DAY   Clinical Intake:  Pre-visit preparation completed: Yes  Pain : No/denies pain     BMI - recorded: 37.87 Nutritional Status: BMI > 30  Obese Nutritional Risks: None Diabetes: Yes CBG done?: No Did pt. bring in CBG monitor from home?: No  How often do you need to have someone help you when you read instructions, pamphlets, or other written materials from your doctor or  pharmacy?: 1 - Never  Diabetic? Nutrition Risk Assessment:  Has the patient had any N/V/D within the last 2 months?  No  Does the patient have any non-healing wounds?  No  Has the patient had any unintentional weight loss or weight gain?  No   Diabetes:  Is the patient diabetic?  Yes  If diabetic, was a CBG obtained today?  No  Did the patient bring in their glucometer from home?  No  How often do you monitor your CBG's? Twice per week.   Financial Strains and Diabetes Management:  Are you having any financial strains with the device, your supplies or your medication? No .  Does the patient want to be seen by Chronic Care Management for management of their diabetes?  No  Would the patient like to be referred to a Nutritionist or for Diabetic Management?  No   Diabetic Exams:  Diabetic Eye Exam: Completed 04/2021.  Diabetic Foot Exam: Completed 04/05/2020. Pt has been advised about the importance in completing this exam. Pt is scheduled for diabetic foot exam on 11/24/2021.    Interpreter Needed?: No  Information entered by :: Keena Dinse, LPN   Activities of Daily Living In your present state of health, do you have any difficulty performing the following activities: 06/04/2021  Hearing? N  Vision? N  Difficulty concentrating or making decisions? N  Walking or climbing stairs? N  Dressing or bathing? N  Doing errands, shopping? N  Preparing Food and eating ? N  Using the Toilet? N  In the past six months, have you accidently leaked urine? N  Do you have problems with loss of bowel control? N  Managing your Medications? N  Managing your Finances? N  Housekeeping or managing your Housekeeping? N  Some recent data might be hidden    Patient  Care Team: Mosie Lukes, MD as PCP - General (Family Medicine) Jacelyn Pi, MD as Consulting Physician (Endocrinology) Noemi Chapel, NP as Nurse Practitioner Irene Shipper, MD as Consulting Physician  (Gastroenterology) Sherryle Lis Stephan Minister, DPM as Consulting Physician (Podiatry)  Indicate any recent Medical Services you may have received from other than [facility] providers in the past year (date may be approximate).     Assessment:   This is a routine wellness examination for [facility].  Hearing/Vision screen Hearing Screening - Comments:: Denies hearing difficulties  Vision Screening - Comments:: Wears eyeglasses - up to date with annual eye exams at [facility] on [address]  Dietary issues and exercise activities discussed: Current Exercise Habits: Home exercise routine, Type of exercise: walking, Time (Minutes): 60, Frequency (Times/Week): 7, Weekly Exercise (Minutes/Week): 420, Intensity: Mild, Exercise limited by: None identified   Goals Addressed             This Visit's Progress    Maintain healthy diet and continue exercising.   On track    Patient would like to get better at guitar - practice more       Depression Screen PHQ 2/9 Scores 06/04/2021 08/16/2020 08/05/2019 03/19/2018 07/02/2017 06/22/2015  PHQ - 2 Score 0 0 0 0 0 0  Exception Documentation - - - - - Other- indicate reason in comment box    Fall Risk Fall Risk  06/04/2021 08/05/2019 03/19/2018 06/22/2015  Falls in the past year? 0 0 No No  Number falls in past yr: 0 0 - -  Injury with Fall? 0 0 - -  Risk for fall due to : Medication side effect;Impaired vision - - Other (Comment)  Follow up Falls prevention discussed - - -    FALL RISK PREVENTION PERTAINING TO THE HOME:  Any stairs in or around the home? Yes  If so, are there any without handrails? No  Home free of loose throw rugs in walkways, pet beds, electrical cords, etc? Yes  Adequate lighting in your home to reduce risk of falls? Yes   ASSISTIVE DEVICES UTILIZED TO PREVENT FALLS:  Life alert? No  Use of a cane, walker or w/c? No  Grab bars in the bathroom? Yes  Shower chair or bench in shower? No  Elevated toilet seat or a handicapped toilet? No    TIMED UP AND GO:  Was the test performed? No . Telephonic visit  Cognitive Function: Patient is unable to complete screening 6CIT or MMSE. He refuses today  MMSE - Mini Mental State Exam 03/19/2018  Not completed: Refused        Immunizations Immunization History  Administered Date(s) Administered   Influenza Split 06/15/2017, 06/07/2019, 05/16/2020   Influenza Whole 06/01/2013   Influenza,inj,Quad PF,6+ Mos 06/09/2014, 06/15/2017, 05/17/2018   Influenza-Unspecified 06/11/2012, 05/11/2015, 06/07/2016   Moderna Sars-Covid-2 Vaccination 11/17/2019, 12/16/2019, 09/08/2020   PPD Test 01/04/2012   Pneumococcal Polysaccharide-23 11/04/2011   Tdap 03/21/2018    TDAP status: Up to date  Flu Vaccine status: Up to date  Pneumococcal vaccine status: Due, Education has been provided regarding the importance of this vaccine. Advised may receive this vaccine at local pharmacy or Health Dept. Aware to provide a copy of the vaccination record if obtained from local pharmacy or Health Dept. Verbalized acceptance and understanding.  Covid-19 vaccine status: Completed vaccines  Qualifies for Shingles Vaccine? No   Zostavax completed No    Screening Tests Health Maintenance  Topic Date Due   Pneumococcal Vaccine 70-13 Years old (2 - PCV)  11/03/2012   OPHTHALMOLOGY EXAM  10/05/2018   FOOT EXAM  03/22/2019   INFLUENZA VACCINE  04/25/2021   COVID-19 Vaccine (4 - Booster for Moderna series) 08/31/2021 (Originally 12/01/2020)   HEMOGLOBIN A1C  11/16/2021   TETANUS/TDAP  03/21/2028   COLONOSCOPY (Pts 45-75yr Insurance coverage will need to be confirmed)  06/07/2030   PNEUMOCOCCAL POLYSACCHARIDE VACCINE AGE 78-64 HIGH RISK  Completed   Hepatitis C Screening  Completed   HIV Screening  Completed   HPV VACCINES  Aged Out    Health Maintenance  Health Maintenance Due  Topic Date Due   Pneumococcal Vaccine 056675Years old (2 - PCV) 11/03/2012   OPHTHALMOLOGY EXAM  10/05/2018   FOOT EXAM   03/22/2019   INFLUENZA VACCINE  04/25/2021    Colorectal cancer screening: Type of screening: Colonoscopy. Completed 06/07/2020. Repeat every 10 years  Lung Cancer Screening: (Low Dose CT Chest recommended if Age 47-80years, 30 pack-year currently smoking OR have quit w/in 15years.) does not qualify.   Additional Screening:  Hepatitis C Screening: does not qualify; Completed 03/16/2011  Vision Screening: Recommended annual ophthalmology exams for early detection of glaucoma and other disorders of the eye. Is the patient up to date with their annual eye exam?  Yes  Who is the provider or what is the name of the office in which the patient attends annual eye exams? [facility] [address] If pt is not established with a provider, would they like to be referred to a provider to establish care? No .   Dental Screening: Recommended annual dental exams for proper oral hygiene  Community Resource Referral / Chronic Care Management: CRR required this visit?  No   CCM required this visit?  No      Plan:     I have personally reviewed and noted the following in the patient's chart:   Medical and social history Use of alcohol, tobacco or illicit drugs  Current medications and supplements including opioid prescriptions. Patient is not currently taking opioid prescriptions. Functional ability and status Nutritional status Physical activity Advanced directives List of other physicians Hospitalizations, surgeries, and ER visits in previous 12 months Vitals Screenings to include cognitive, depression, and falls Referrals and appointments  In addition, I have reviewed and discussed with patient certain preventive protocols, quality metrics, and best practice recommendations. A written personalized care plan for preventive services as well as general preventive health recommendations were provided to patient.     ASandrea Hammond LPN   [id number]  Nurse Notes: His CPAP machine broke  recently - he insists he doesn't need any help at this time getting a new one - but if this changes, he will let uKoreaknow

## 2021-06-06 ENCOUNTER — Encounter: Payer: Self-pay | Admitting: *Deleted

## 2021-06-10 DIAGNOSIS — E7800 Pure hypercholesterolemia, unspecified: Secondary | ICD-10-CM | POA: Diagnosis not present

## 2021-06-10 DIAGNOSIS — E119 Type 2 diabetes mellitus without complications: Secondary | ICD-10-CM | POA: Diagnosis not present

## 2021-06-10 DIAGNOSIS — I10 Essential (primary) hypertension: Secondary | ICD-10-CM | POA: Diagnosis not present

## 2021-06-22 ENCOUNTER — Other Ambulatory Visit: Payer: Self-pay | Admitting: Family Medicine

## 2021-07-05 DIAGNOSIS — Z23 Encounter for immunization: Secondary | ICD-10-CM | POA: Diagnosis not present

## 2021-07-13 ENCOUNTER — Telehealth: Payer: Self-pay | Admitting: Family Medicine

## 2021-07-13 ENCOUNTER — Ambulatory Visit (INDEPENDENT_AMBULATORY_CARE_PROVIDER_SITE_OTHER): Payer: Medicare Other | Admitting: Cardiology

## 2021-07-13 ENCOUNTER — Other Ambulatory Visit: Payer: Self-pay

## 2021-07-13 VITALS — BP 116/78 | HR 75 | Ht 73.0 in | Wt 278.0 lb

## 2021-07-13 DIAGNOSIS — F17200 Nicotine dependence, unspecified, uncomplicated: Secondary | ICD-10-CM | POA: Diagnosis not present

## 2021-07-13 DIAGNOSIS — E119 Type 2 diabetes mellitus without complications: Secondary | ICD-10-CM

## 2021-07-13 DIAGNOSIS — G4733 Obstructive sleep apnea (adult) (pediatric): Secondary | ICD-10-CM | POA: Diagnosis not present

## 2021-07-13 DIAGNOSIS — R0609 Other forms of dyspnea: Secondary | ICD-10-CM | POA: Diagnosis not present

## 2021-07-13 DIAGNOSIS — F259 Schizoaffective disorder, unspecified: Secondary | ICD-10-CM | POA: Diagnosis not present

## 2021-07-13 MED ORDER — EZETIMIBE 10 MG PO TABS: 10.0000 mg | ORAL_TABLET | Freq: Every day | ORAL | 1 refills | Status: DC

## 2021-07-13 NOTE — Telephone Encounter (Signed)
Patient dropped off FL2 form for blyth to fill out  Patient would like to be called when its ready to be picked up  Placed in PCP bin up front

## 2021-07-13 NOTE — Progress Notes (Signed)
Cardiology Consultation:    Date:  07/13/2021   ID:  Joseph [name], DOB [birth year], MRN [id number]  PCP:  Mosie Lukes, MD  Cardiologist:  Jenne Campus, MD   Referring MD: Mosie Lukes, MD   Chief Complaint  Patient presents with   Establish Care    History of Present Illness:    Joseph [name] is a 45 y.o. male who is being seen today for the evaluation of dyspnea on exertion, dyslipidemia, multiple risk factors for coronary artery disease at the request of Mosie Lukes, MD. his past medical history significant for essential hypertension, diabetes, dyslipidemia with intolerance to statin, he is who used to be a smoker but now he is vaping.  He also can schizophrenia.  He was referred to [place] to help manage of all his problems.  He denies have any chest pain tightness squeezing pressure burning chest.  There is no swelling of lower extremities there is no palpitations no dizziness no passing out.  He exercise 5 times a week he goes to gym and exercise at the gym a walk for about 1 hour she got no difficulty doing it that he did not notice any decrease in exercise tolerance for the last few years.  He used to smoke tobacco however now he uses vapes.  He did have a trial off statin however developed compartment syndrome and that medication has been withdrawn.  He does have family history of premature coronary artery disease.  He is not on a special diet.  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   Medicare annual wellness visit, subsequent 03/19/2016   Obesity    Schizophrenia ([place])    Sleep apnea    wears cpap, no 02    Past Surgical History:  Procedure Laterality Date   COLONOSCOPY     eye surgery     b/l laser eye sx 2004   I & D EXTREMITY     left leg   LEG SURGERY     TONSILLECTOMY     WISDOM TOOTH EXTRACTION      Current Medications: Current Meds  Medication Sig   acetaminophen (TYLENOL) 325 MG tablet 1 tab po every 8 hours as needed for pain   aspirin 81 MG EC tablet TAKE 1 TABLET BY MOUTH EVERY DAY   clonazePAM (KLONOPIN) 1 MG tablet Take 1 mg by mouth 2 (two) times daily. And As needed For anxiety   divalproex (DEPAKOTE ER) 500 MG 24 hr tablet Take 500 mg by mouth 3 (three) times daily.   docusate sodium (COLACE) 100 MG capsule TAKE 1 CAPSULE(100 MG) BY MOUTH TWICE DAILY   FLUoxetine (PROZAC) 20 MG capsule Take 20 mg by mouth 2 (two) times daily.    ibuprofen (ADVIL) 800 MG tablet TAKE 1 TABLET(800 MG) BY MOUTH EVERY 8 HOURS AS NEEDED  Lancets (ONETOUCH DELICA PLUS BHALPF79K) MISC 1 each by Other route daily. Glucose   lisinopril (ZESTRIL) 2.5 MG tablet Take 1 tablet (2.5 mg total) by mouth daily.   loratadine (CLARITIN) 10 MG tablet Take 1 tablet (10 mg total) by mouth daily.   LYBALVI 20-10 MG TABS Take 1 tablet by mouth at bedtime.   metFORMIN (GLUCOPHAGE-XR) 500 MG 24 hr tablet Take 1,000 mg by mouth in the morning and at bedtime.   Multiple Vitamins-Minerals (MULTIVITAMIN MEN) TABS Take 1 tablet by mouth daily.   OLANZapine (ZYPREXA) 2.5 MG tablet Take 2.5 mg by mouth at bedtime.   ONETOUCH VERIO test strip 1 each by Other route as needed for other (Glucose check).   polyethylene glycol powder (GLYCOLAX/MIRALAX) 17 GM/SCOOP powder TAKE 17 GRAMS BY MOUTH DAILY   PREVIDENT 0.2 % SOLN Apply 1 application topically as needed (Unknown reason).   Wheat Dextrin (CLEAR SOLUBLE FIBER) POWD TAKE 1 DOSE BY MOUTH EVERY MORNING AND  EVERY NIGHT AT BEDTIME     Allergies:   Aripiprazole, Haldol [haloperidol decanoate], Risperidone and related, and Statins   Social History   Socioeconomic History   Marital status: Single    Spouse name: Not on file   Number of children: 0   Years of education: Not on file   Highest education level: Not on file  Occupational History    Employer: [employer]    Comment: [employer]  Tobacco Use   Smoking status: Former    Packs/day: 1.00    Years: 1.00    Pack years: 1.00    Types: Cigars, Cigarettes    Start date: 07/15/2012    Quit date: 04/25/2021    Years since quitting: 0.2   Smokeless tobacco: Former    Types: Chew   Tobacco comments:    4 cigars A DAY  Vaping Use   Vaping Use: Every day  Substance and Sexual Activity   Alcohol use: No   Drug use: No   Sexual activity: Not Currently  Other Topics Concern   Not on file  Social History Narrative   Lives in a [place] - his dad pays for it - Visits with Dad every Saturday and Sunday   Social Determinants of Health   Financial Resource Strain: Low Risk    Difficulty of Paying Living Expenses: Not hard at all  Food Insecurity: No Food Insecurity   Worried About Charity fundraiser in the Last Year: Never true   Arboriculturist in the Last Year: Never true  Transportation Needs: No Transportation Needs   Lack of Transportation (Medical): No   Lack of Transportation (Non-Medical): No  Physical Activity: Sufficiently Active   Days of Exercise per Week: 7 days   Minutes of Exercise per Session: 60 min  Stress: No Stress Concern Present   Feeling of Stress : Not at all  Social Connections: Moderately Integrated   Frequency of Communication with Friends and Family: More than three times a week   Frequency of Social Gatherings with Friends and Family: More than three times a week   Attends Religious Services: More than 4 times per year   Active Member of [employer] or Organizations: Yes   Attends Programme researcher, broadcasting/film/video: More than 4 times per year   Marital Status: Never married     Family History: The patient's family history includes Cancer in his mother; Diverticulosis in his father; GER disease in his mother; Stomach cancer in  his mother. There is no history of Colon cancer, Pancreatic cancer, Esophageal cancer, Colon polyps, or Rectal cancer. ROS:   Please see the history of present illness.    All 14 point review of systems negative except as described per history of present illness.  EKGs/Labs/Other Studies Reviewed:    The following studies were reviewed today:   EKG:  EKG is  ordered today.  The ekg ordered today demonstrates normal sinus rhythm normal P interval normal QS complex duration fulgent no ST segment changes  Recent Labs: 05/16/2021: ALT 24; BUN 9; Creatinine, Ser 0.84; Hemoglobin 13.9; Platelets 211.0; Potassium 4.3; Sodium 136; TSH 2.17  Recent Lipid Panel    Component Value Date/Time   CHOL 179 05/16/2021 1113   TRIG 329.0 (H) 05/16/2021 1113   HDL 39.60 05/16/2021 1113   CHOLHDL 5 05/16/2021 1113   VLDL 65.8 (H) 05/16/2021 1113   LDLCALC 99 01/24/2021 1036   LDLCALC 92 06/04/2020 0921   LDLDIRECT 98.0 05/16/2021 1113    Physical Exam:    VS:  BP 116/78 (BP Location: Right Arm, Patient Position: Sitting)   Pulse 75   Ht 6\' 1"  (1.854 m)   Wt 278 lb (126.1 kg)   SpO2 95%   BMI 36.68 kg/m     Wt Readings from Last 3 Encounters:  07/13/21 278 lb (126.1 kg)  06/04/21 287 lb (130.2 kg)  05/16/21 287 lb 3.2 oz (130.3 kg)     GEN:  Well nourished, well developed in no acute distress HEENT: Normal NECK: No JVD; No carotid bruits LYMPHATICS: No lymphadenopathy CARDIAC: RRR, no murmurs, no rubs, no gallops RESPIRATORY:  Clear to auscultation without rales, wheezing or rhonchi  ABDOMEN: Soft, non-tender, non-distended MUSCULOSKELETAL:  No edema; No deformity  SKIN: Warm and dry NEUROLOGIC:  Alert and oriented x 3 PSYCHIATRIC:  Normal affect    ASSESSMENT:    1. Schizoaffective disorder, unspecified type ([place])   2. Type 2 diabetes mellitus without complication, without long-term current use of insulin ([place])   3. OSA (obstructive sleep apnea)   4. Vaping nicotine dependence, non-tobacco product    PLAN:    In order of problems listed above:  Schizophrenia.  Successfully managed on appropriate medication looks stable.  No recent psychotic episodes. Obstructive sleep apnea he does have BiPAP mask over that equipment is broken and he is waiting for new equipment.  I encouraged him to use it when he gets that Vaping which I told him it is a bad habit and I strongly recommend to quit.  He will work on it. Dyslipidemia he is LDL is 99 however he is diabetic ideally he need to be on moderate intensity statin.  He cannot tolerate statin.  I will put him on Zetia labs to have fasting lipid profile done within the next few weeks. Dyspnea on exertion as well as significant sleep apnea we will schedule him to have an echocardiogram done to look at left ventricle ejection fraction as well as right ventricle function and size.   Medication Adjustments/Labs and Tests Ordered: Current medicines are reviewed at length with the patient today.  Concerns regarding medicines are outlined above.  No orders of the defined types were placed in this encounter.  No orders of the defined types were placed in this encounter.   Signed, Park Liter, MD, [facility]. 07/13/2021 3:12 PM    [facility]

## 2021-07-13 NOTE — Patient Instructions (Signed)
Medication Instructions:  Your physician has recommended you make the following change in your medication:  START: Zetia 10 mg daily  *If you need a refill on your cardiac medications before your next appointment, please call your pharmacy*   Lab Work: Your physician recommends that you return for lab work 6 weeks: Lipid, LFT If you have labs (blood work) drawn today and your tests are completely normal, you will receive your results only by: Joseph [name] (if you have [place]) OR A paper copy in the mail If you have any lab test that is abnormal or we need to change your treatment, we will call you to review the results.   Testing/Procedures: Your physician has requested that you have an echocardiogram. Echocardiography is a painless test that uses sound waves to create images of your heart. It provides your doctor with information about the size and shape of your heart and how well your heart's chambers and valves are working. This procedure takes approximately one hour. There are no restrictions for this procedure.    Follow-Up: At [facility], you and your health needs are our priority.  As part of our continuing mission to provide you with exceptional heart care, we have created designated Provider Care Teams.  These Care Teams include your primary Cardiologist (physician) and Advanced Practice Providers (APPs -  Physician Assistants and Nurse Practitioners) who all work together to provide you with the care you need, when you need it.  We recommend signing up for the patient portal called "MyChart".  Sign up information is provided on this After Visit Summary.  MyChart is used to connect with patients for Virtual Visits (Telemedicine).  Patients are able to view lab/test results, encounter notes, upcoming appointments, etc.  Non-urgent messages can be sent to your provider as well.   To learn more about what you can do with MyChart, go to [email or web address].    Your next  appointment:   6 month(s)  The format for your next appointment:   In Person  Provider:   Jenne Campus, MD   Other Instructions  Echocardiogram An echocardiogram is a test that uses sound waves (ultrasound) to produce images of the heart. Images from an echocardiogram can provide important information about: Heart size and shape. The size and thickness and movement of your heart's walls. Heart muscle function and strength. Heart valve function or if you have stenosis. Stenosis is when the heart valves are too narrow. If blood is flowing backward through the heart valves (regurgitation). A tumor or infectious growth around the heart valves. Areas of heart muscle that are not working well because of poor blood flow or injury from a heart attack. Aneurysm detection. An aneurysm is a weak or damaged part of an artery wall. The wall bulges out from the normal force of blood pumping through the body. Tell a health care provider about: Any allergies you have. All medicines you are taking, including vitamins, herbs, eye drops, creams, and over-the-counter medicines. Any blood disorders you have. Any surgeries you have had. Any medical conditions you have. Whether you are pregnant or may be pregnant. What are the risks? Generally, this is a safe test. However, problems may occur, including an allergic reaction to dye (contrast) that may be used during the test. What happens before the test? No specific preparation is needed. You may eat and drink normally. What happens during the test?  You will take off your clothes from the waist up and put on a hospital gown.  Electrodes or electrocardiogram (ECG)patches may be placed on your chest. The electrodes or patches are then connected to a device that monitors your heart rate and rhythm. You will lie down on a table for an ultrasound exam. A gel will be applied to your chest to help sound waves pass through your skin. A handheld device,  called a transducer, will be pressed against your chest and moved over your heart. The transducer produces sound waves that travel to your heart and bounce back (or "echo" back) to the transducer. These sound waves will be captured in real-time and changed into images of your heart that can be viewed on a video monitor. The images will be recorded on a computer and reviewed by your health care provider. You may be asked to change positions or hold your breath for a short time. This makes it easier to get different views or better views of your heart. In some cases, you may receive contrast through an IV in one of your veins. This can improve the quality of the pictures from your heart. The procedure may vary among health care providers and hospitals. What can I expect after the test? You may return to your normal, everyday life, including diet, activities, and medicines, unless your health care provider tells you not to do that. Follow these instructions at home: It is up to you to get the results of your test. Ask your health care provider, or the department that is doing the test, when your results will be ready. Keep all follow-up visits. This is important. Summary An echocardiogram is a test that uses sound waves (ultrasound) to produce images of the heart. Images from an echocardiogram can provide important information about the size and shape of your heart, heart muscle function, heart valve function, and other possible heart problems. You do not need to do anything to prepare before this test. You may eat and drink normally. After the echocardiogram is completed, you may return to your normal, everyday life, unless your health care provider tells you not to do that. This information is not intended to replace advice given to you by your health care provider. Make sure you discuss any questions you have with your health care provider. Document Revised: 05/04/2020 Document Reviewed:  05/04/2020 Elsevier Patient Education  2022 [place].  Ezetimibe Tablets What is this medication? EZETIMIBE (ez ET i mibe) treats high cholesterol. It works by reducing the amount of cholesterol absorbed from the food you eat. This decreases the amount of bad cholesterol (such as LDL) in your blood. Changes to diet and exercise are often combined with this medication. This medicine may be used for other purposes; ask your health care provider or pharmacist if you have questions. COMMON BRAND NAME(S): Zetia What should I tell my care team before I take this medication? They need to know if you have any of these conditions: Kidney disease Liver disease Muscle cramps, pain Muscle injury Thyroid disease An unusual or allergic reaction to ezetimibe, other medications, foods, dyes, or preservatives Pregnant or trying to get pregnant Breast-feeding How should I use this medication? Take this medication by mouth. Take it as directed on the prescription label at the same time every day. You can take it with or without food. If it upsets your stomach, take it with food. Keep taking it unless your care team tells you to stop. Take bile acid sequestrants at a different time of day than this medication. Take this medication 2 hours BEFORE or  4 hours AFTER bile acid sequestrants. Talk to your care team about the use of this medication in children. While it may be prescribed for children as young as 10 for selected conditions, precautions do apply. Overdosage: If you think you have taken too much of this medicine contact a poison control center or emergency room at once. NOTE: This medicine is only for you. Do not share this medicine with others. What if I miss a dose? If you miss a dose, take it as soon as you can. If it is almost time for your next dose, take only that dose. Do not take double or extra doses. What may interact with this medication? Do not take this medication with any of the  following: Fenofibrate Gemfibrozil This medication may also interact with the following: Antacids Cyclosporine Herbal medications like red yeast rice Other medications to lower cholesterol or triglycerides This list may not describe all possible interactions. Give your health care provider a list of all the medicines, herbs, non-prescription drugs, or dietary supplements you use. Also tell them if you smoke, drink alcohol, or use illegal drugs. Some items may interact with your medicine. What should I watch for while using this medication? Visit your care team for regular checks on your progress. Tell your care team if your symptoms do not start to get better or if they get worse. Your care team may tell you to stop taking this medication if you develop muscle problems. If your muscle problems do not go away after stopping this medication, contact your care team. Do not become pregnant while taking this medication. Women should inform their care team if they wish to become pregnant or think they might be pregnant. There is potential for serious harm to an unborn child. Talk to your care team for more information. Do not breast-feed an infant while taking this medication. Taking this medication is only part of a total heart healthy program. Your care team may give you a special diet to follow. Avoid alcohol. Avoid smoking. Ask your care team how much you should exercise. What side effects may I notice from receiving this medication? Side effects that you should report to your doctor or health care provider as soon as possible: Allergic reactions-skin rash, itching or hives, swelling of the face, lips, tongue, or throat Side effects that usually do not require medical attention (report to your doctor or health care provider if they continue or are bothersome): Diarrhea Joint pain This list may not describe all possible side effects. Call your doctor for medical advice about side effects. You may  report side effects to FDA at 1-800-FDA-1088. Where should I keep my medication? Keep out of the reach of children and pets. Store at room temperature between 15 and 30 degrees C (59 and 86 degrees F). Protect from moisture. Get rid of any unused medication after the expiration date. NOTE: This sheet is a summary. It may not cover all possible information. If you have questions about this medicine, talk to your doctor, pharmacist, or health care provider.  2022 Elsevier/Gold Standard (2020-10-08 12:29:13)

## 2021-07-14 NOTE — Telephone Encounter (Signed)
Form placed in yellow folder.

## 2021-07-26 NOTE — Telephone Encounter (Signed)
FL2 form mailed to [facility].

## 2021-08-01 ENCOUNTER — Ambulatory Visit (HOSPITAL_BASED_OUTPATIENT_CLINIC_OR_DEPARTMENT_OTHER): Admission: RE | Admit: 2021-08-01 | Payer: Medicare Other | Source: Ambulatory Visit

## 2021-08-04 ENCOUNTER — Other Ambulatory Visit: Payer: Self-pay | Admitting: Family Medicine

## 2021-08-07 ENCOUNTER — Other Ambulatory Visit: Payer: Self-pay | Admitting: Podiatry

## 2021-08-08 NOTE — Telephone Encounter (Signed)
Patient may require f/u appointment for medication refil,please advise.

## 2021-09-02 ENCOUNTER — Other Ambulatory Visit: Payer: Self-pay | Admitting: Family Medicine

## 2021-09-05 ENCOUNTER — Other Ambulatory Visit: Payer: Self-pay

## 2021-09-05 ENCOUNTER — Ambulatory Visit (HOSPITAL_BASED_OUTPATIENT_CLINIC_OR_DEPARTMENT_OTHER)
Admission: RE | Admit: 2021-09-05 | Discharge: 2021-09-05 | Disposition: A | Discharge status: Home / Self Care | Payer: Medicare Other | Source: Ambulatory Visit | Attending: Cardiology | Admitting: Cardiology

## 2021-09-05 DIAGNOSIS — G4733 Obstructive sleep apnea (adult) (pediatric): Secondary | ICD-10-CM

## 2021-09-05 DIAGNOSIS — E119 Type 2 diabetes mellitus without complications: Secondary | ICD-10-CM | POA: Insufficient documentation

## 2021-09-05 DIAGNOSIS — F17200 Nicotine dependence, unspecified, uncomplicated: Secondary | ICD-10-CM

## 2021-09-05 DIAGNOSIS — R0609 Other forms of dyspnea: Secondary | ICD-10-CM | POA: Insufficient documentation

## 2021-09-05 LAB — ECHOCARDIOGRAM COMPLETE
AR max vel: 2.95 cm2
AV Area VTI: 2.83 cm2
AV Area mean vel: 3.08 cm2
AV Mean grad: 5 mmHg
AV Peak grad: 9.1 mmHg
Ao pk vel: 1.51 m/s
Area-P 1/2: 4.83 cm2
Calc EF: 61 %
S' Lateral: 2.9 cm
Single Plane A2C EF: 60.7 %
Single Plane A4C EF: 62.3 %

## 2021-09-08 ENCOUNTER — Telehealth: Payer: Self-pay

## 2021-09-08 NOTE — Telephone Encounter (Signed)
-----   Message from Park Liter, MD sent at 09/08/2021 11:53 AM EST ----- Echocardiogram showed normal left ventricle ejection fraction, mild left ventricle hypertrophy, overall looks good

## 2021-09-08 NOTE — Telephone Encounter (Signed)
Patient notified of results.

## 2021-09-13 ENCOUNTER — Ambulatory Visit: Payer: Medicare Other | Admitting: Pulmonary Disease

## 2021-09-19 ENCOUNTER — Other Ambulatory Visit: Payer: Self-pay | Admitting: Family Medicine

## 2021-10-04 DIAGNOSIS — E7800 Pure hypercholesterolemia, unspecified: Secondary | ICD-10-CM | POA: Diagnosis not present

## 2021-10-04 DIAGNOSIS — E119 Type 2 diabetes mellitus without complications: Secondary | ICD-10-CM | POA: Diagnosis not present

## 2021-10-17 DIAGNOSIS — E119 Type 2 diabetes mellitus without complications: Secondary | ICD-10-CM | POA: Diagnosis not present

## 2021-10-17 DIAGNOSIS — I10 Essential (primary) hypertension: Secondary | ICD-10-CM | POA: Diagnosis not present

## 2021-10-17 DIAGNOSIS — E663 Overweight: Secondary | ICD-10-CM | POA: Diagnosis not present

## 2021-10-17 DIAGNOSIS — E7800 Pure hypercholesterolemia, unspecified: Secondary | ICD-10-CM | POA: Diagnosis not present

## 2021-10-25 ENCOUNTER — Telehealth: Payer: Self-pay | Admitting: Family Medicine

## 2021-10-25 ENCOUNTER — Ambulatory Visit: Payer: Medicare Other | Admitting: Pulmonary Disease

## 2021-10-25 NOTE — Telephone Encounter (Signed)
Patient would like his pharmacy changed to [facility] [address] [place], [place] [address]  Phone: [phone]

## 2021-10-26 ENCOUNTER — Ambulatory Visit: Payer: Medicare Other | Admitting: Pulmonary Disease

## 2021-10-26 NOTE — Telephone Encounter (Signed)
Called pt to see if needed any refills sent

## 2021-10-28 ENCOUNTER — Telehealth: Payer: Self-pay | Admitting: Family Medicine

## 2021-10-28 MED ORDER — POLYETHYLENE GLYCOL 3350 17 GM/SCOOP PO POWD: ORAL | 5 refills | Status: DC

## 2021-10-28 NOTE — Telephone Encounter (Signed)
Rx snt

## 2021-10-28 NOTE — Telephone Encounter (Signed)
Medication: polyethylene glycol powder (GAVILAX) 17 GM/SCOOP powder  Has the patient contacted their pharmacy? Yes.   (If no, request that the patient contact the pharmacy for the refill.) (If yes, when and what did the pharmacy advise?)  Preferred Pharmacy (with phone number or street name):  [facility] [place], [place] [address]            Phone: [phone]  Agent: Please be advised that RX refills may take up to 3 business days. We ask that you follow-up with your pharmacy.

## 2021-11-10 NOTE — Telephone Encounter (Signed)
Pt called office , appt with Caleen Jobs made for 11/24/21

## 2021-11-14 ENCOUNTER — Other Ambulatory Visit: Payer: Self-pay | Admitting: Family Medicine

## 2021-11-24 ENCOUNTER — Ambulatory Visit (INDEPENDENT_AMBULATORY_CARE_PROVIDER_SITE_OTHER): Payer: Medicare Other | Admitting: Family Medicine

## 2021-11-24 ENCOUNTER — Encounter: Payer: Self-pay | Admitting: Family Medicine

## 2021-11-24 ENCOUNTER — Ambulatory Visit: Payer: Medicare Other | Admitting: Family Medicine

## 2021-11-24 VITALS — BP 118/70 | HR 85 | Resp 20 | Ht 73.0 in | Wt 294.4 lb

## 2021-11-24 DIAGNOSIS — Z0000 Encounter for general adult medical examination without abnormal findings: Secondary | ICD-10-CM

## 2021-11-24 DIAGNOSIS — K5900 Constipation, unspecified: Secondary | ICD-10-CM | POA: Diagnosis not present

## 2021-11-24 DIAGNOSIS — E785 Hyperlipidemia, unspecified: Secondary | ICD-10-CM

## 2021-11-24 DIAGNOSIS — E119 Type 2 diabetes mellitus without complications: Secondary | ICD-10-CM | POA: Diagnosis not present

## 2021-11-24 DIAGNOSIS — I10 Essential (primary) hypertension: Secondary | ICD-10-CM | POA: Diagnosis not present

## 2021-11-24 MED ORDER — EZETIMIBE 10 MG PO TABS: 10.0000 mg | ORAL_TABLET | Freq: Every day | ORAL | 1 refills | Status: DC

## 2021-11-24 MED ORDER — SOLUBLE FIBER THERAPY PO POWD: ORAL | 1 refills | Status: DC

## 2021-11-24 MED ORDER — CLEAR SOLUBLE FIBER PO POWD: ORAL | 5 refills | Status: DC

## 2021-11-24 NOTE — Assessment & Plan Note (Signed)
Well controlled with last A1c 6.9% ?Continue current medications ?UTD on vaccines, foot exam ?On ACEi ?Discussed diet and exercise ?F/u in  months ? ?

## 2021-11-24 NOTE — Assessment & Plan Note (Signed)
HLD PLAN: ?-Reviewed most recent lipid panel ?-Medication management: continue Zetia ?-Repeat CMP and lipid panel today ?-Diet low in saturated fat ?-Regular exercise - at least 30 minutes, 5 times per week ? ?

## 2021-11-24 NOTE — Patient Instructions (Signed)
Continue current medicines.  ?Check with the pulmonologist about getting a new CPAP.  ?Labs today - we will let you know results and any changes.  ? ?

## 2021-11-24 NOTE — Progress Notes (Signed)
Established Patient Office Visit  Subjective:  Patient ID: Joseph [name], male    DOB: 17-Nov-1975  Age: 46 y.o. MRN: [id number]  CC:  Chief Complaint  Patient presents with   Diabetes    Follow up    HPI Joseph [name] presents for follow-up for diabetes, htn, hld.   Reports increasing daytime sleepiness - has been without CPAP for about a year (it broke), they are working on getting a new one via pulmonologist.   DIABETES: - Checking BG at home: daily, around 170s - Medications: metformin 1000 mg BID  - Compliance: good - Diet: low carb/salt/sodium diet  - Exercise: daily walking for 30-45 minutes  - Eye exam: referral placed  - Foot exam: today - Denies symptoms of hypoglycemia, polyuria, polydipsia, numbness extremities, foot ulcers/trauma, wounds that are not healing, medication side effects    HYPERTENSION: - Medications: lisinopril 2.5 mg daily,  - Compliance: good - Checking BP at home: weekly, group home says it's good  - Denies any SOB, recurrent headaches, CP, vision changes, LE edema, dizziness, palpitations, or medication side effects.    HYPERLIPIDEMIA - medications: zetia 10 mg daily,  - compliance: good - medication SEs: none The 10-year ASCVD risk score (Arnett DK, et al., 2019) is: 5.6%*   Values used to calculate the score:     Age: 47 years     Sex: Male     Is Non-Hispanic African American: No     Diabetic: Yes     Tobacco smoker: No     Systolic Blood Pressure: 852 mmHg     Is BP treated: Yes     HDL Cholesterol: 50 mg/dL*     Total Cholesterol: 219 mg/dL*     * - Cholesterol units were assumed for this score calculation      Past Medical History:  Diagnosis Date   Anemia    post operative   Antiphospholipid syndrome ([place]) 08/15/2011   Anxiety    Blood transfusion without reported diagnosis    x 2 per pt    Cellulitis    bilateral thighs   Cerumen impaction 07/13/2013   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[place])    Constipation in  male    Depression    Diabetes ([place])    no medications    Elevated BP 10/20/2012   Enlarged thyroid 03/07/2015   H/O tobacco use, presenting hazards to health 12/01/2011   Quit October 2014    Hyperlipidemia    Lupus anticoagulant positive 08/15/2011   Medicare annual wellness visit, subsequent 03/19/2016   Obesity    Schizophrenia ([place])    Sleep apnea    wears cpap, no 02    Past Surgical History:  Procedure Laterality Date   COLONOSCOPY     eye surgery     b/l laser eye sx 2004   I & D EXTREMITY     left leg   LEG SURGERY     TONSILLECTOMY     WISDOM TOOTH EXTRACTION      Family History  Problem Relation Age of Onset   Diverticulosis Father    Stomach cancer Mother        stomach CA dx at 24   GER disease Mother    Cancer Mother        stomach   Colon cancer Neg Hx    Pancreatic cancer Neg Hx    Esophageal cancer Neg Hx    Colon polyps Neg Hx    Rectal  cancer Neg Hx     Social History   Socioeconomic History   Marital status: Single    Spouse name: Not on file   Number of children: 0   Years of education: Not on file   Highest education level: Not on file  Occupational History    Employer: [employer]    Comment: [employer]  Tobacco Use   Smoking status: Former    Packs/day: 1.00    Years: 1.00    Pack years: 1.00    Types: Cigars, Cigarettes    Start date: 07/15/2012    Quit date: 04/25/2021    Years since quitting: 0.5   Smokeless tobacco: Former    Types: Chew   Tobacco comments:    4 cigars A DAY  Vaping Use   Vaping Use: Every day  Substance and Sexual Activity   Alcohol use: No   Drug use: No   Sexual activity: Not Currently  Other Topics Concern   Not on file  Social History Narrative   Lives in a [place] - his dad pays for it - Visits with Dad every Saturday and Sunday   Social Determinants of Health   Financial Resource Strain: Low Risk    Difficulty of Paying Living Expenses: Not hard at all  Food Insecurity: No Food  Insecurity   Worried About Charity fundraiser in the Last Year: Never true   Arboriculturist in the Last Year: Never true  Transportation Needs: No Transportation Needs   Lack of Transportation (Medical): No   Lack of Transportation (Non-Medical): No  Physical Activity: Sufficiently Active   Days of Exercise per Week: 7 days   Minutes of Exercise per Session: 60 min  Stress: No Stress Concern Present   Feeling of Stress : Not at all  Social Connections: Moderately Integrated   Frequency of Communication with Friends and Family: More than three times a week   Frequency of Social Gatherings with Friends and Family: More than three times a week   Attends Religious Services: More than 4 times per year   Active Member of [employer] or Organizations: Yes   Attends Music therapist: More than 4 times per year   Marital Status: Never married  Human resources officer Violence: Not At Risk   Fear of Current or Ex-Partner: No   Emotionally Abused: No   Physically Abused: No   Sexually Abused: No    Outpatient Medications Prior to Visit  Medication Sig Dispense Refill   acetaminophen (TYLENOL) 325 MG tablet 1 tab po every 8 hours as needed for pain 30 tablet 0   aspirin 81 MG EC tablet TAKE 1 TABLET BY MOUTH EVERY DAY 30 tablet 11   clonazePAM (KLONOPIN) 1 MG tablet Take 1 mg by mouth 2 (two) times daily. And As needed For anxiety     divalproex (DEPAKOTE ER) 500 MG 24 hr tablet Take 500 mg by mouth 3 (three) times daily.     docusate sodium (COLACE) 100 MG capsule TAKE 1 CAPSULE(100 MG) BY MOUTH TWICE DAILY 60 capsule 12   FLUoxetine (PROZAC) 20 MG capsule Take 20 mg by mouth 2 (two) times daily.   0   ibuprofen (ADVIL) 800 MG tablet TAKE 1 TABLET(800 MG) BY MOUTH EVERY 8 HOURS AS NEEDED 30 tablet 0   Lancets (ONETOUCH DELICA PLUS HENIDP82U) MISC 1 each by Other route daily. Glucose     lisinopril (ZESTRIL) 2.5 MG tablet TAKE 1 TABLET(2.5  MG) BY MOUTH DAILY 90 tablet 1   loratadine  (CLARITIN) 10 MG tablet TAKE 1 TABLET(10 MG) BY MOUTH DAILY 90 tablet 1   LYBALVI 20-10 MG TABS Take 1 tablet by mouth at bedtime.     metFORMIN (GLUCOPHAGE-XR) 500 MG 24 hr tablet Take 1,000 mg by mouth in the morning and at bedtime.     Multiple Vitamins-Minerals (MULTIVITAMIN MEN) TABS TAKE 1 TABLET BY MOUTH EVERY DAY 90 tablet 3   OLANZapine (ZYPREXA) 2.5 MG tablet Take 2.5 mg by mouth at bedtime.     ONETOUCH VERIO test strip 1 each by Other route as needed for other (Glucose check).     polyethylene glycol powder (GAVILAX) 17 GM/SCOOP powder MIX 17 GRAMS WITH MORNING DOSE OF BENEFIBER IN 6-8 OUNCES OF LIQUID AND DRINK BY MOUTH DAILY 238 g 5   PREVIDENT 0.2 % SOLN Apply 1 application topically as needed (Unknown reason).     SOLUBLE FIBER THERAPY oral powder TAKE 1 DOSE BY MOUTH EVERY MORNING AND AT BEDTIME 454 g 1   Wheat Dextrin (CLEAR SOLUBLE FIBER) POWD TAKE 1 DOSE BY MOUTH EVERY MORNING AND EVERY NIGHT AT BEDTIME 477 g 5   diclofenac Sodium (VOLTAREN) 1 % GEL APPLY 4 GRAMS TOPICALLY 2- FOUR TIMES DAILY. RUB INTO BACK OF RIGHT HEEL& ALONG ACHILLES TENDON 100 g 2   ezetimibe (ZETIA) 10 MG tablet Take 1 tablet (10 mg total) by mouth daily. 90 tablet 1   No facility-administered medications prior to visit.    Allergies  Allergen Reactions   Aripiprazole Other (See Comments)   Haldol [Haloperidol Decanoate]     Painful, HA   Risperidone And Related     Risperdol per pt    Statins     ? Related to compartment syndrome    ROS Review of Systems All review of systems negative except what is listed in the HPI    Objective:    Physical Exam Constitutional:      Appearance: Normal appearance. He is obese.  Cardiovascular:     Rate and Rhythm: Normal rate and regular rhythm.  Pulmonary:     Effort: Pulmonary effort is normal.     Breath sounds: Normal breath sounds.  Musculoskeletal:     Cervical back: Normal range of motion and neck supple.  Skin:    General: Skin is warm  and dry.     Comments: Diabetic foot exam was performed.  No deformities or other abnormal visual findings.  Posterior tibialis and dorsalis pulse intact bilaterally.  Intact to touch and monofilament testing bilaterally.    Neurological:     General: No focal deficit present.     Mental Status: He is alert and oriented to person, place, and time. Mental status is at baseline.  Psychiatric:        Mood and Affect: Mood normal.        Behavior: Behavior normal.        Thought Content: Thought content normal.    BP 118/70 (BP Location: Left Arm, Patient Position: Sitting, Cuff Size: Large)    Pulse 85    Resp 20    Ht 6\' 1"  (1.854 m)    Wt 294 lb 6.4 oz (133.5 kg)    SpO2 95%    BMI 38.84 kg/m  Wt Readings from Last 3 Encounters:  11/24/21 294 lb 6.4 oz (133.5 kg)  07/13/21 278 lb (126.1 kg)  06/04/21 287 lb (130.2 kg)     Health Maintenance Due  Topic Date Due   OPHTHALMOLOGY EXAM  10/05/2018   FOOT EXAM  03/22/2019   COVID-19 Vaccine (4 - Booster for Moderna series) 11/03/2020   HEMOGLOBIN A1C  11/16/2021    There are no preventive care reminders to display for this patient.  Lab Results  Component Value Date   TSH 2.17 05/16/2021   Lab Results  Component Value Date   WBC 7.1 05/16/2021   HGB 13.9 05/16/2021   HCT 41.4 05/16/2021   MCV 97.6 05/16/2021   PLT 211.0 05/16/2021   Lab Results  Component Value Date   NA 136 05/16/2021   K 4.3 05/16/2021   CO2 28 05/16/2021   GLUCOSE 75 05/16/2021   BUN 9 05/16/2021   CREATININE 0.84 05/16/2021   BILITOT 0.3 05/16/2021   ALKPHOS 45 05/16/2021   AST 22 05/16/2021   ALT 24 05/16/2021   PROT 6.6 05/16/2021   ALBUMIN 4.0 05/16/2021   CALCIUM 9.0 05/16/2021   GFR 104.01 05/16/2021   Lab Results  Component Value Date   CHOL 179 05/16/2021   Lab Results  Component Value Date   HDL 39.60 05/16/2021   Lab Results  Component Value Date   LDLCALC 99 01/24/2021   Lab Results  Component Value Date   TRIG 329.0  (H) 05/16/2021   Lab Results  Component Value Date   CHOLHDL 5 05/16/2021   Lab Results  Component Value Date   HGBA1C 6.6 (H) 05/16/2021      Assessment & Plan:   Problem List Items Addressed This Visit       Cardiovascular and Mediastinum   HTN (hypertension)    Blood pressure is at goal for age and co-morbidities.  I recommend continue lisinopril.  In addition they were instructed on the following: - BP goal <130/80 - monitor and log blood pressures at home - check around the same time each day in a relaxed setting - Limit salt to <2000 mg/day - Follow DASH eating plan (heart healthy diet) - limit alcohol to 2 standard drinks per day for men and 1 per day for women - avoid tobacco products - get at least 2 hours of regular aerobic exercise weekly Patient aware of signs/symptoms requiring further/urgent evaluation. Labs updated today.       Relevant Medications   ezetimibe (ZETIA) 10 MG tablet     Endocrine   Diabetes mellitus ([place]) - Primary    Well controlled with last A1c 6.9% Continue current medications UTD on vaccines, foot exam On ACEi Discussed diet and exercise F/u in  months       Relevant Orders   Hemoglobin A1c   Lipid panel   Comprehensive metabolic panel   CBC with Differential/Platelet   Ambulatory referral to Ophthalmology     Other   Hyperlipidemia, mild    HLD PLAN: -Reviewed most recent lipid panel -Medication management: continue Zetia -Repeat CMP and lipid panel today -Diet low in saturated fat -Regular exercise - at least 30 minutes, 5 times per week       Relevant Medications   ezetimibe (ZETIA) 10 MG tablet   Constipation    No acute concerns. Refills placed.       Relevant Medications   Wheat Dextrin (CLEAR SOLUBLE FIBER) POWD   methylcellulose (SOLUBLE FIBER THERAPY) oral powder   Other Visit Diagnoses     Preventative health care       Relevant Orders   Lipid panel   Comprehensive metabolic panel   CBC with  Differential/Platelet       Meds ordered this encounter  Medications   Wheat Dextrin (CLEAR SOLUBLE FIBER) POWD    Sig: TAKE 1 DOSE BY MOUTH EVERY MORNING AND EVERY NIGHT AT BEDTIME    Dispense:  477 g    Refill:  5   methylcellulose (SOLUBLE FIBER THERAPY) oral powder    Sig: TAKE 1 DOSE BY MOUTH EVERY MORNING AND AT BEDTIME    Dispense:  454 g    Refill:  1   ezetimibe (ZETIA) 10 MG tablet    Sig: Take 1 tablet (10 mg total) by mouth daily.    Dispense:  90 tablet    Refill:  1    Follow-up: Return in about 6 months (around 05/27/2022) for routine PCP.    Terrilyn Saver, NP

## 2021-11-24 NOTE — Assessment & Plan Note (Signed)
No acute concerns. Refills placed.  ?

## 2021-11-24 NOTE — Assessment & Plan Note (Signed)
Blood pressure is at goal for age and co-morbidities.  I recommend continue lisinopril.  In addition they were instructed on the following: ?- BP goal <130/80 ?- monitor and log blood pressures at home ?- check around the same time each day in a relaxed setting ?- Limit salt to <2000 mg/day ?- Follow DASH eating plan (heart healthy diet) ?- limit alcohol to 2 standard drinks per day for men and 1 per day for women ?- avoid tobacco products ?- get at least 2 hours of regular aerobic exercise weekly ?Patient aware of signs/symptoms requiring further/urgent evaluation. ?Labs updated today. ? ?

## 2021-11-25 LAB — LDL CHOLESTEROL, DIRECT: Direct LDL: 42 mg/dL

## 2021-11-25 LAB — LIPID PANEL
Cholesterol: 109 mg/dL (ref 0–200)
HDL: 42.2 mg/dL (ref >39.00)
NonHDL: 66.71
Total CHOL/HDL Ratio: 3
Triglycerides: 208 mg/dL — ABNORMAL HIGH (ref 0.0–149.0)
VLDL: 41.6 mg/dL — ABNORMAL HIGH (ref 0.0–40.0)

## 2021-11-25 LAB — CBC WITH DIFFERENTIAL/PLATELET
Basophils Absolute: 0.1 10*3/uL (ref 0.0–0.1)
Basophils Relative: 0.8 % (ref 0.0–3.0)
Eosinophils Absolute: 0.3 10*3/uL (ref 0.0–0.7)
Eosinophils Relative: 4.2 % (ref 0.0–5.0)
HCT: 40.8 % (ref 39.0–52.0)
Hemoglobin: 13.8 g/dL (ref 13.0–17.0)
Lymphocytes Relative: 31.4 % (ref 12.0–46.0)
Lymphs Abs: 2.4 10*3/uL (ref 0.7–4.0)
MCHC: 33.9 g/dL (ref 30.0–36.0)
MCV: 97.2 fl (ref 78.0–100.0)
Monocytes Absolute: 0.9 10*3/uL (ref 0.1–1.0)
Monocytes Relative: 11.3 % (ref 3.0–12.0)
Neutro Abs: 4.1 10*3/uL (ref 1.4–7.7)
Neutrophils Relative %: 52.3 % (ref 43.0–77.0)
Platelets: 232 10*3/uL (ref 150.0–400.0)
RBC: 4.2 Mil/uL — ABNORMAL LOW (ref 4.22–5.81)
RDW: 13.1 % (ref 11.5–15.5)
WBC: 7.8 10*3/uL (ref 4.0–10.5)

## 2021-11-25 LAB — COMPREHENSIVE METABOLIC PANEL
ALT: 35 U/L (ref 0–53)
AST: 24 U/L (ref 0–37)
Albumin: 4.2 g/dL (ref 3.5–5.2)
Alkaline Phosphatase: 53 U/L (ref 39–117)
BUN: 13 mg/dL (ref 6–23)
CO2: 30 mEq/L (ref 19–32)
Calcium: 9.3 mg/dL (ref 8.4–10.5)
Chloride: 98 mEq/L (ref 96–112)
Creatinine, Ser: 1.11 mg/dL (ref 0.40–1.50)
GFR: 78.91 mL/min (ref >60.00)
Glucose, Bld: 175 mg/dL — ABNORMAL HIGH (ref 70–99)
Potassium: 4.3 mEq/L (ref 3.5–5.1)
Sodium: 137 mEq/L (ref 135–145)
Total Bilirubin: 0.3 mg/dL (ref 0.2–1.2)
Total Protein: 6.4 g/dL (ref 6.0–8.3)

## 2021-11-25 LAB — HEMOGLOBIN A1C: Hgb A1c MFr Bld: 8 % — ABNORMAL HIGH (ref 4.6–6.5)

## 2021-11-30 DIAGNOSIS — H524 Presbyopia: Secondary | ICD-10-CM | POA: Diagnosis not present

## 2021-11-30 DIAGNOSIS — E119 Type 2 diabetes mellitus without complications: Secondary | ICD-10-CM | POA: Diagnosis not present

## 2021-11-30 DIAGNOSIS — H1789 Other corneal scars and opacities: Secondary | ICD-10-CM | POA: Diagnosis not present

## 2021-11-30 DIAGNOSIS — H5213 Myopia, bilateral: Secondary | ICD-10-CM | POA: Diagnosis not present

## 2021-11-30 MED ORDER — EMPAGLIFLOZIN 10 MG PO TABS: 10.0000 mg | ORAL_TABLET | Freq: Every day | ORAL | 1 refills | Status: DC

## 2021-11-30 NOTE — Addendum Note (Signed)
Addended by: Caleen Jobs B on: 11/30/2021 07:52 AM ? ? Modules accepted: Orders ? ?

## 2021-12-06 ENCOUNTER — Other Ambulatory Visit: Payer: Self-pay

## 2021-12-06 ENCOUNTER — Encounter: Payer: Self-pay | Admitting: Pulmonary Disease

## 2021-12-06 ENCOUNTER — Ambulatory Visit (INDEPENDENT_AMBULATORY_CARE_PROVIDER_SITE_OTHER): Payer: Medicare Other | Admitting: Pulmonary Disease

## 2021-12-06 DIAGNOSIS — G4733 Obstructive sleep apnea (adult) (pediatric): Secondary | ICD-10-CM

## 2021-12-06 NOTE — Progress Notes (Signed)
? ?  Subjective:  ? ? Patient ID: Joseph [name], male    DOB: [birth year], 46 y.o.   MRN: [id number] ? ?HPI ? ?Joseph [name] lives in a group home and has severe OSA  ?This was well controlled on CPAP 11 cm.  ? ?PM H -diabetes type 2 ?Schizophrenia ? ?He last saw [place] in 2020. ?His machine apparently broke a year and a half ago and he has not gotten the replacement.  He never called [place] about this.  He has gained 50 pounds since his original sleep study.  He is now on diabetes medications, he is able to drive. ?He quit smoking about a year ago. ?He goes for a walk in the morning for 1 hour every day and works out at the [employer] times a week. ?He reports increasing fatigue and some daytime somnolence. ? ?He is on valproate and clonazepam and Zyprexa ? ? ?Significant tests/ events reviewed ? ?PSG 08/2006 - 240 pounds- AHI of 10 events per hour with mild oxygen desaturation to lowest of 87%. This was corrected by CPAP of 6 cm with a full face mask.  ?  ?PSG 08/2013 showed AHI 62/h, predom hypopneas corrected by 12 cm ? ?Review of Systems ?neg for any significant sore throat, dysphagia, itching, sneezing, nasal congestion or excess/ purulent secretions, fever, chills, sweats, unintended wt loss, pleuritic or exertional cp, hempoptysis, orthopnea pnd or change in chronic leg swelling. Also denies presyncope, palpitations, heartburn, abdominal pain, nausea, vomiting, diarrhea or change in bowel or urinary habits, dysuria,hematuria, rash, arthralgias, visual complaints, headache, numbness weakness or ataxia. ? ?   ?Objective:  ? Physical Exam ? ?Gen. Pleasant, obese, in no distress ?ENT - no lesions, no post nasal drip ?Neck: No JVD, no thyromegaly, no carotid bruits ?Lungs: no use of accessory muscles, no dullness to percussion, decreased without rales or rhonchi  ?Cardiovascular: Rhythm regular, heart sounds  normal, no murmurs or gallops, no peripheral edema ?Musculoskeletal: No deformities, no cyanosis or clubbing , no  tremors ? ? ? ?   ?Assessment & Plan:  ? ? ?Hypersomnolence -if somnolence persists in spite of adequate treatment of OSA then this could be attributed to medications ?

## 2021-12-06 NOTE — Assessment & Plan Note (Signed)
He had severe OSA on a sleep study in 2015.  He has gained weight since then.  His CPAP machine broke a year ago and he needs a replacement. ?We will send in a prescription for auto CPAP 10 to 15 cm.  We will obtain a download from this new machine and fine-tune settings as needed.  He was very compliant in the past and CPAP certainly helped improve his daytime somnolence and fatigue.  ? ?Weight loss encouraged, compliance with goal of at least 4-6 hrs every night is the expectation. ?Advised against medications with sedative side effects ?Cautioned against driving when sleepy - understanding that sleepiness will vary on a day to day basis ? ?

## 2021-12-06 NOTE — Patient Instructions (Signed)
X Rx for autoCPAP 10-15 cm will be sent to DME ?

## 2021-12-07 ENCOUNTER — Other Ambulatory Visit: Payer: Self-pay | Admitting: Cardiology

## 2021-12-07 ENCOUNTER — Encounter: Payer: Self-pay | Admitting: *Deleted

## 2021-12-07 DIAGNOSIS — E785 Hyperlipidemia, unspecified: Secondary | ICD-10-CM

## 2021-12-09 ENCOUNTER — Other Ambulatory Visit: Payer: Self-pay | Admitting: Cardiology

## 2021-12-09 DIAGNOSIS — E785 Hyperlipidemia, unspecified: Secondary | ICD-10-CM

## 2021-12-13 ENCOUNTER — Telehealth: Payer: Self-pay | Admitting: Pulmonary Disease

## 2021-12-14 NOTE — Telephone Encounter (Signed)
Called patient but per his roommate, he was not available and will not be available since he went on a trip with his father. Will call back in a few days.  ?

## 2021-12-23 ENCOUNTER — Telehealth: Payer: Self-pay | Admitting: Family Medicine

## 2021-12-23 MED ORDER — LORATADINE 10 MG PO TABS: ORAL_TABLET | ORAL | 1 refills | Status: DC

## 2021-12-23 MED ORDER — LISINOPRIL 2.5 MG PO TABS: ORAL_TABLET | ORAL | 1 refills | Status: DC

## 2021-12-23 NOTE — Telephone Encounter (Signed)
Refill sent.

## 2021-12-23 NOTE — Telephone Encounter (Signed)
Pt states his pharmacy has changed. ? ?Medication: ?1.lisinopril (ZESTRIL) 2.5 MG tablet  ?2.loratadine (CLARITIN) 10 MG tablet ? ?Has the patient contacted their pharmacy? Yes.   ? ?Preferred Pharmacy (with phone number or street name):  ?[facility] ?[address], [place], [place] [address] ?Phone: [phone] ? ?Agent: Please be advised that RX refills may take up to 3 business days. We ask that you follow-up with your pharmacy.  ?

## 2021-12-29 ENCOUNTER — Ambulatory Visit: Payer: Medicare Other | Admitting: Cardiology

## 2022-01-05 ENCOUNTER — Other Ambulatory Visit: Payer: Self-pay | Admitting: Family Medicine

## 2022-01-09 DIAGNOSIS — E7800 Pure hypercholesterolemia, unspecified: Secondary | ICD-10-CM | POA: Diagnosis not present

## 2022-01-09 DIAGNOSIS — E119 Type 2 diabetes mellitus without complications: Secondary | ICD-10-CM | POA: Diagnosis not present

## 2022-01-14 DIAGNOSIS — U071 COVID-19: Secondary | ICD-10-CM | POA: Diagnosis not present

## 2022-01-15 DIAGNOSIS — Z20822 Contact with and (suspected) exposure to covid-19: Secondary | ICD-10-CM | POA: Diagnosis not present

## 2022-01-18 DIAGNOSIS — Z20822 Contact with and (suspected) exposure to covid-19: Secondary | ICD-10-CM | POA: Diagnosis not present

## 2022-02-06 DIAGNOSIS — E663 Overweight: Secondary | ICD-10-CM | POA: Diagnosis not present

## 2022-02-06 DIAGNOSIS — E119 Type 2 diabetes mellitus without complications: Secondary | ICD-10-CM | POA: Diagnosis not present

## 2022-02-06 DIAGNOSIS — E7800 Pure hypercholesterolemia, unspecified: Secondary | ICD-10-CM | POA: Diagnosis not present

## 2022-02-07 ENCOUNTER — Other Ambulatory Visit: Payer: Self-pay | Admitting: Family Medicine

## 2022-02-07 DIAGNOSIS — K5900 Constipation, unspecified: Secondary | ICD-10-CM

## 2022-02-09 DIAGNOSIS — F200 Paranoid schizophrenia: Secondary | ICD-10-CM | POA: Diagnosis not present

## 2022-03-07 ENCOUNTER — Other Ambulatory Visit: Payer: Self-pay | Admitting: Family Medicine

## 2022-03-14 ENCOUNTER — Ambulatory Visit (INDEPENDENT_AMBULATORY_CARE_PROVIDER_SITE_OTHER): Payer: Medicare Other | Admitting: Orthopedic Surgery

## 2022-03-14 ENCOUNTER — Encounter: Payer: Self-pay | Admitting: Orthopedic Surgery

## 2022-03-14 DIAGNOSIS — M21371 Foot drop, right foot: Secondary | ICD-10-CM

## 2022-03-14 DIAGNOSIS — M21372 Foot drop, left foot: Secondary | ICD-10-CM

## 2022-03-14 DIAGNOSIS — L97901 Non-pressure chronic ulcer of unspecified part of unspecified lower leg limited to breakdown of skin: Secondary | ICD-10-CM

## 2022-03-14 NOTE — Progress Notes (Signed)
Office Visit Note   Patient: Joseph [name]           Date of Birth: Apr 15, 1976           MRN: [id number] Visit Date: 03/14/2022              Requested by: Mosie Lukes, MD [address] [place],  [place] [address] PCP: Mosie Lukes, MD  Chief Complaint  Patient presents with   Right Leg - Pain   Left Leg - Pain      HPI: Patient is a 46 year old gentleman who presents with ulcers on the anterior aspect of his tibia from the AFO braces.  Patient states this started about a month ago.  Initially patient had compartment syndrome in both legs necessitating extensive debridement of the anterior compartment this left patient with a foot drop and he has been wearing the ankle-foot orthosis anterior braces.  Patient recently was able to ambulate on the beach without his braces on for 2 miles without a problem.  Assessment & Plan: Visit Diagnoses:  1. Foot drop, bilateral   2. Ulcer of lower extremity, limited to breakdown of skin, unspecified laterality ([place])     Plan: Patient will discontinue the braces at this time he was given a sample pair of 5 socks 2 XL.  He will wear these around-the-clock.  Follow-Up Instructions: Return if symptoms worsen or fail to improve.   Ortho Exam  Patient is alert, oriented, no adenopathy, well-dressed, normal affect, normal respiratory effort. Examination patient has superficial ulcers over the anterior aspect of the tibia bilaterally secondary to his anterior AFOs.  The ulcers are 2 cm in diameter and 1 mm deep with healthy granulation tissue.  The right calf is 45 cm in circumference left calf is 47 cm in circumference.  With ambulation patient does have a foot drop but he is able to maintain his gait without instability.  Imaging: No results found. No images are attached to the encounter.  Labs: Lab Results  Component Value Date   HGBA1C 8.0 (H) 11/24/2021   HGBA1C 6.6 (H) 05/16/2021   HGBA1C 6.9 (H) 01/24/2021    REPTSTATUS 11/04/2011 FINAL 11/04/2011   REPTSTATUS 11/07/2011 FINAL 11/04/2011   GRAMSTAIN  11/04/2011    FEW WBC PRESENT, PREDOMINANTLY PMN NO SQUAMOUS EPITHELIAL CELLS SEEN NO ORGANISMS SEEN   CULT NORMAL OROPHARYNGEAL FLORA 11/04/2011   LABORGA NO GROWTH 03/01/2015     Lab Results  Component Value Date   ALBUMIN 4.2 11/24/2021   ALBUMIN 4.0 05/16/2021   ALBUMIN 4.3 01/24/2021    Lab Results  Component Value Date   MG 2.2 03/17/2011   MG 2.1 03/16/2011   No results found for: "VD25OH"  No results found for: "PREALBUMIN"    Latest Ref Rng & Units 11/24/2021    2:51 PM 05/16/2021   11:13 AM 01/24/2021   10:36 AM  CBC EXTENDED  WBC 4.0 - 10.5 K/uL 7.8  7.1  6.6   RBC 4.22 - 5.81 Mil/uL 4.20  4.25  4.38   Hemoglobin 13.0 - 17.0 g/dL 13.8  13.9  14.5   HCT 39.0 - 52.0 % 40.8  41.4  42.8   Platelets 150.0 - 400.0 K/uL 232.0  211.0  238.0   NEUT# 1.4 - 7.7 K/uL 4.1  3.2    Lymph# 0.7 - 4.0 K/uL 2.4  2.8       There is no height or weight on file to calculate  BMI.  Orders:  No orders of the defined types were placed in this encounter.  No orders of the defined types were placed in this encounter.    Procedures: No procedures performed  Clinical Data: No additional findings.  ROS:  All other systems negative, except as noted in the HPI. Review of Systems  Objective: Vital Signs: There were no vitals taken for this visit.  Specialty Comments:  No specialty comments available.  PMFS History: Patient Active Problem List   Diagnosis Date Noted   Overweight 06/04/2021   Type 2 diabetes mellitus without complications ([place]) 69/67/8938   TB lung, latent 06/02/2021   Anemia 05/23/2021   Cellulitis 05/23/2021   Depression 05/23/2021   Constipation 08/16/2020   Heel spur, right 08/16/2020   Statin intolerance 02/11/2020   Change in bowel habits 08/05/2019   Blood in stool 08/05/2019   Urinary hesitancy 08/05/2019   Medicare annual wellness visit, subsequent  03/19/2016   Morbid obesity ([place]) 06/14/2015   Enlarged thyroid 03/07/2015   PPD positive 02/08/2014   Cerumen impaction 07/13/2013   Foot drop, bilateral 12/03/2012   HTN (hypertension) 10/20/2012   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   Abnormal liver enzymes 01/14/2012   Hyperlipidemia, mild 12/01/2011   OSA (obstructive sleep apnea) 12/01/2011   Vaping nicotine dependence, non-tobacco product 12/01/2011   Schizophrenia ([place]) 12/01/2011   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[place]) 11/12/2011   Schizoaffective disorder ([place]) 11/04/2011   Diabetes mellitus ([place]) 11/04/2011   Iron deficiency anemia 08/15/2011   Lupus anticoagulant positive 08/15/2011   Anxiety 08/15/2011   Past Medical History:  Diagnosis Date   Anemia    post operative   Antiphospholipid syndrome ([place]) 08/15/2011   Anxiety    Blood transfusion without reported diagnosis    x 2 per pt    Cellulitis    bilateral thighs   Cerumen impaction 07/13/2013   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[place])    Constipation in male    Depression    Diabetes ([place])    no medications    Elevated BP 10/20/2012   Enlarged thyroid 03/07/2015   H/O tobacco use, presenting hazards to health 12/01/2011   Quit October 2014    Hyperlipidemia    Lupus anticoagulant positive 08/15/2011   Medicare annual wellness visit, subsequent 03/19/2016   Obesity    Schizophrenia ([place])    Sleep apnea    wears cpap, no 02    Family History  Problem Relation Age of Onset   Diverticulosis Father    Stomach cancer Mother        stomach CA dx at 49   GER disease Mother    Cancer Mother        stomach   Colon cancer Neg Hx    Pancreatic cancer Neg Hx    Esophageal cancer Neg Hx    Colon polyps Neg Hx    Rectal cancer Neg Hx     Past Surgical History:  Procedure Laterality Date   COLONOSCOPY     eye surgery     b/l laser eye sx 2004   I & D EXTREMITY     left leg   LEG SURGERY     TONSILLECTOMY     WISDOM TOOTH EXTRACTION     Social History    Occupational History    Employer: [employer]    Comment: [employer]  Tobacco Use   Smoking status: Former    Packs/day: 1.00    Years: 1.00    Total  pack years: 1.00    Types: Cigars, Cigarettes    Start date: 07/15/2012    Quit date: 04/25/2021    Years since quitting: 0.8   Smokeless tobacco: Former    Types: Chew   Tobacco comments:    4 cigars A DAY  Vaping Use   Vaping Use: Every day  Substance and Sexual Activity   Alcohol use: No   Drug use: No   Sexual activity: Not Currently

## 2022-03-23 ENCOUNTER — Other Ambulatory Visit: Payer: Self-pay

## 2022-03-23 MED ORDER — LORATADINE 10 MG PO TABS: ORAL_TABLET | ORAL | 1 refills | Status: DC

## 2022-04-12 ENCOUNTER — Telehealth: Payer: Self-pay | Admitting: Podiatry

## 2022-04-12 ENCOUNTER — Ambulatory Visit (INDEPENDENT_AMBULATORY_CARE_PROVIDER_SITE_OTHER): Payer: Medicare Other | Admitting: Family Medicine

## 2022-04-12 ENCOUNTER — Encounter: Payer: Self-pay | Admitting: Family Medicine

## 2022-04-12 VITALS — BP 104/62 | HR 97 | Ht 73.0 in | Wt 267.4 lb

## 2022-04-12 DIAGNOSIS — Z87828 Personal history of other (healed) physical injury and trauma: Secondary | ICD-10-CM | POA: Diagnosis not present

## 2022-04-12 DIAGNOSIS — R413 Other amnesia: Secondary | ICD-10-CM | POA: Diagnosis not present

## 2022-04-12 MED ORDER — IBUPROFEN 800 MG PO TABS: ORAL_TABLET | ORAL | 0 refills | Status: DC

## 2022-04-12 NOTE — Telephone Encounter (Signed)
Nursing facility called requesting ibuprofen (ADVIL) 800 MG tablet to be sent to [facility]. This is so they can have it for their records.   Please advise.

## 2022-04-12 NOTE — Addendum Note (Signed)
Addended bySherryle Lis, Dayona Shaheen R on: 04/12/2022 05:05 PM   Modules accepted: Orders
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
## 2022-04-12 NOTE — Patient Instructions (Signed)
Requesting DMV driving review. Neurology referral to evaluate and complete form. Recommend you refrain from driving in the meantime for safety concerns.

## 2022-04-14 DIAGNOSIS — F200 Paranoid schizophrenia: Secondary | ICD-10-CM | POA: Diagnosis not present

## 2022-04-17 DIAGNOSIS — F203 Undifferentiated schizophrenia: Secondary | ICD-10-CM | POA: Diagnosis not present

## 2022-04-26 ENCOUNTER — Telehealth: Payer: Self-pay | Admitting: Family Medicine

## 2022-04-26 NOTE — Telephone Encounter (Signed)
Spoke with pt's caregiver. When pt saw Lovena Le last month she referred him to neurology so they could do cognitive testing and make the decision in regards to his driving. Pt has upcoming appointment with them, caregiver will come pick up papers so that neurology can complete them.

## 2022-04-26 NOTE — Telephone Encounter (Signed)
Pt's caregiver Raliegh Ip) dropped off document to be filled out by provider (Department [employer] Transportation- 11 pages in yellow large envelope) Caregiver would like to be called when document ready for pick up at [facility] [phone]. Document put at front office tray under providers name.

## 2022-05-01 ENCOUNTER — Other Ambulatory Visit: Payer: Self-pay | Admitting: Family Medicine

## 2022-05-05 ENCOUNTER — Telehealth: Payer: Self-pay | Admitting: Internal Medicine

## 2022-05-05 NOTE — Telephone Encounter (Signed)
Joseph [name] from [facility] called to request hard copy medication scripts to be sent to them tel: [phone].

## 2022-05-05 NOTE — Telephone Encounter (Signed)
Informed [facility] that patient has not been seen since 2021 and does not have any prescriptions from [place].

## 2022-05-11 DIAGNOSIS — F200 Paranoid schizophrenia: Secondary | ICD-10-CM | POA: Diagnosis not present

## 2022-05-25 ENCOUNTER — Encounter: Payer: Self-pay | Admitting: Diagnostic Neuroimaging

## 2022-05-25 ENCOUNTER — Ambulatory Visit (INDEPENDENT_AMBULATORY_CARE_PROVIDER_SITE_OTHER): Payer: Medicare Other | Admitting: Diagnostic Neuroimaging

## 2022-05-25 VITALS — BP 131/81 | HR 98 | Ht 73.0 in | Wt 257.0 lb

## 2022-05-25 DIAGNOSIS — R4689 Other symptoms and signs involving appearance and behavior: Secondary | ICD-10-CM | POA: Diagnosis not present

## 2022-05-25 DIAGNOSIS — R413 Other amnesia: Secondary | ICD-10-CM
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
## 2022-05-26 ENCOUNTER — Telehealth: Payer: Self-pay | Admitting: Diagnostic Neuroimaging

## 2022-05-26 NOTE — Telephone Encounter (Signed)
medicare/medicaid NPR sent to GI
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
## 2022-05-29 NOTE — Assessment & Plan Note (Deleted)
hgba1c acceptable, mildly elevated, minimize simple carbs. Increase exercise as tolerated. Continue current meds, follows with endocrinology

## 2022-05-29 NOTE — Assessment & Plan Note (Deleted)
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

## 2022-05-29 NOTE — Assessment & Plan Note (Deleted)
Encourage heart healthy diet such as MIND or DASH diet, increase exercise, avoid trans fats, simple carbohydrates and processed foods, consider a krill or fish or flaxseed oil cap daily. Unable to tolerate statins

## 2022-05-29 NOTE — Assessment & Plan Note (Deleted)
Well controlled, no changes to meds. Encouraged heart healthy diet such as the DASH diet and exercise as tolerated.  °

## 2022-05-30 ENCOUNTER — Ambulatory Visit: Payer: Medicare Other | Admitting: Family Medicine

## 2022-05-30 NOTE — Progress Notes (Shared)
Subjective:   By signing my name below, I, [name], attest that this documentation has been prepared under the direction and in the presence of Mosie Lukes, MD 05/30/2022.     Patient ID: Joseph [name], male    DOB: 06-07-1976, 46 y.o.   MRN: [id number]  No chief complaint on file.  HPI Patient is in today for an office visit.   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[place])    Constipation in male    Depression    Diabetes ([place])    no medications    Elevated BP 10/20/2012   Enlarged thyroid 03/07/2015   H/O tobacco use, presenting hazards to health 12/01/2011   Quit October 2014    Hyperlipidemia    Lupus anticoagulant positive 08/15/2011   Medicare annual wellness visit, subsequent 03/19/2016   Obesity    Schizophrenia ([place])    Sleep apnea    wears cpap, no 02   Past Surgical History:  Procedure Laterality Date   COLONOSCOPY     eye surgery     b/l laser eye sx 2004   I & D EXTREMITY     left leg   LEG SURGERY     TONSILLECTOMY     WISDOM TOOTH EXTRACTION     Family History  Problem Relation Age of Onset   Diverticulosis Father    Stomach cancer Mother        stomach CA dx at 19   GER disease Mother    Cancer Mother        stomach   Colon cancer Neg Hx    Pancreatic cancer Neg Hx    Esophageal cancer Neg Hx    Colon polyps Neg Hx    Rectal cancer Neg Hx    Social History   Socioeconomic History   Marital status: Single    Spouse name: Not on file   Number of children: 0   Years of education: Not on file   Highest education level: Not on file  Occupational History    Employer: [employer]    Comment: [employer]  Tobacco Use   Smoking status: Former    Packs/day: 1.00    Years: 1.00    Total  pack years: 1.00    Types: Cigars, Cigarettes    Start date: 07/15/2012    Quit date: 04/25/2021    Years since quitting: 1.0   Smokeless tobacco: Former    Types: Chew   Tobacco comments:    4 cigars A DAY  Vaping Use   Vaping Use: Every day  Substance and Sexual Activity   Alcohol use: No   Drug use: No   Sexual activity: Not Currently  Other Topics Concern   Not on file  Social History Narrative   Lives in a [place] - his dad pays for it - Visits with Dad every Saturday and Sunday   Social Determinants of Health   Financial Resource Strain: Low Risk  (06/04/2021)   Overall Financial Resource Strain (CARDIA)    Difficulty of Paying Living Expenses: Not hard at all  Food Insecurity: No Food Insecurity (06/04/2021)   Hunger Vital Sign  Worried About Charity fundraiser in the Last Year: Never true    [place] in the Last Year: Never true  Transportation Needs: No Transportation Needs (06/04/2021)   PRAPARE - Hydrologist (Medical): No    Lack of Transportation (Non-Medical): No  Physical Activity: Sufficiently Active (06/04/2021)   Exercise Vital Sign    Days of Exercise per Week: 7 days    Minutes of Exercise per Session: 60 min  Stress: No Stress Concern Present (06/04/2021)   [place]    Feeling of Stress : Not at all  Social Connections: Moderately Integrated (06/04/2021)   Social Connection and Isolation Panel [NHANES]    Frequency of Communication with Friends and Family: More than three times a week    Frequency of Social Gatherings with Friends and Family: More than three times a week    Attends Religious Services: More than 4 times per year    Active Member of [employer] or Organizations: Yes    Attends Archivist Meetings: More than 4 times per year    Marital Status: Never married  Intimate Partner Violence: Not At Risk (06/04/2021)   Humiliation, Afraid,  Rape, and Kick questionnaire    Fear of Current or Ex-Partner: No    Emotionally Abused: No    Physically Abused: No    Sexually Abused: No   Outpatient Medications Prior to Visit  Medication Sig Dispense Refill   acetaminophen (TYLENOL) 325 MG tablet 1 tab po every 8 hours as needed for pain 30 tablet 0   aspirin (ASPIR-LOW) 81 MG EC tablet TAKE ONE TABLET BY MOUTH DAILY 90 tablet 3   clonazePAM (KLONOPIN) 0.5 MG tablet Take 0.5 mg by mouth 3 (three) times daily as needed. 1/2 tab     divalproex (DEPAKOTE ER) 500 MG 24 hr tablet Take 500 mg by mouth 3 (three) times daily.     DOK 100 MG capsule TAKE ONE CAPSULE BY MOUTH TWICE A DAY 60 capsule 1   ezetimibe (ZETIA) 10 MG tablet Take 1 tablet (10 mg total) by mouth daily. 90 tablet 1   ezetimibe-simvastatin (VYTORIN) 10-10 MG tablet Take 1 tablet by mouth daily.     ibuprofen (ADVIL) 800 MG tablet TAKE 1 TABLET(800 MG) BY MOUTH EVERY 8 HOURS AS NEEDED 30 tablet 0   Lancets (ONETOUCH DELICA PLUS MVEHMC94B) MISC 1 each by Other route daily. Glucose     lisinopril (ZESTRIL) 2.5 MG tablet TAKE 1 TABLET(2.5 MG) BY MOUTH DAILY 90 tablet 1   loratadine (CLARITIN) 10 MG tablet TAKE 1 TABLET(10 MG) BY MOUTH DAILY 90 tablet 1   LYBALVI 20-10 MG TABS Take 1 tablet by mouth at bedtime.     Multiple Vitamins-Minerals (MULTIVITAMIN MEN) TABS TAKE 1 TABLET BY MOUTH EVERY DAY 90 tablet 3   OLANZapine (ZYPREXA) 2.5 MG tablet Take 2.5 mg by mouth at bedtime.     ONETOUCH VERIO test strip 1 each by Other route as needed for other (Glucose check).     polyethylene glycol powder (GLYCOLAX/MIRALAX) 17 GM/SCOOP powder MIX 17 GRAMS WITH MORNING DOSE OF BENEFIBER IN 6-8 OUNCES OF LIQUID AND DRINK BY MOUTH DAILY 255 g 5   PREVIDENT 0.2 % SOLN Apply 1 application  topically as needed (Unknown reason).     Wheat Dextrin (CLEAR SOLUBLE FIBER) POWD TAKE 1 DOSE BY MOUTH EVERY MORNING AND EVERY NIGHT AT BEDTIME 477 g 5  No facility-administered medications prior to  visit.   Allergies  Allergen Reactions   Aripiprazole Other (See Comments)   Haldol [Haloperidol Decanoate]     Painful, HA   Risperidone And Related     Risperdol per pt    Statins     ? Related to compartment syndrome   ROS     Objective:    Physical Exam Constitutional:      General: He is not in acute distress.    Appearance: Normal appearance. He is not ill-appearing.  HENT:     Head: Normocephalic and atraumatic.     Right Ear: External ear normal.     Left Ear: External ear normal.     Mouth/Throat:     Mouth: Mucous membranes are moist.     Pharynx: Oropharynx is clear.  Eyes:     Extraocular Movements: Extraocular movements intact.     Pupils: Pupils are equal, round, and reactive to light.  Cardiovascular:     Rate and Rhythm: Normal rate and regular rhythm.     Pulses: Normal pulses.     Heart sounds: Normal heart sounds. No murmur heard.    No gallop.  Pulmonary:     Effort: Pulmonary effort is normal. No respiratory distress.     Breath sounds: Normal breath sounds. No wheezing or rales.  Abdominal:     General: Bowel sounds are normal.  Skin:    General: Skin is warm and dry.  Neurological:     Mental Status: He is alert and oriented to person, place, and time.  Psychiatric:        Mood and Affect: Mood normal.        Behavior: Behavior normal.        Judgment: Judgment normal.    There were no vitals taken for this visit. Wt Readings from Last 3 Encounters:  05/25/22 257 lb (116.6 kg)  04/12/22 267 lb 6.4 oz (121.3 kg)  12/06/21 292 lb 12.8 oz (132.8 kg)   Diabetic Foot Exam - Simple   No data filed    Lab Results  Component Value Date   WBC 7.8 11/24/2021   HGB 13.8 11/24/2021   HCT 40.8 11/24/2021   PLT 232.0 11/24/2021   GLUCOSE 175 (H) 11/24/2021   CHOL 109 11/24/2021   TRIG 208.0 (H) 11/24/2021   HDL 42.20 11/24/2021   LDLDIRECT 42.0 11/24/2021   LDLCALC 99 01/24/2021   ALT 35 11/24/2021   AST 24 11/24/2021   NA 137  11/24/2021   K 4.3 11/24/2021   CL 98 11/24/2021   CREATININE 1.11 11/24/2021   BUN 13 11/24/2021   CO2 30 11/24/2021   TSH 2.17 05/16/2021   PSA 0.31 08/05/2019   HGBA1C 8.0 (H) 11/24/2021   MICROALBUR 0.50 07/03/2013   Lab Results  Component Value Date   TSH 2.17 05/16/2021   Lab Results  Component Value Date   WBC 7.8 11/24/2021   HGB 13.8 11/24/2021   HCT 40.8 11/24/2021   MCV 97.2 11/24/2021   PLT 232.0 11/24/2021   Lab Results  Component Value Date   NA 137 11/24/2021   K 4.3 11/24/2021   CO2 30 11/24/2021   GLUCOSE 175 (H) 11/24/2021   BUN 13 11/24/2021   CREATININE 1.11 11/24/2021   BILITOT 0.3 11/24/2021   ALKPHOS 53 11/24/2021   AST 24 11/24/2021   ALT 35 11/24/2021   PROT 6.4 11/24/2021   ALBUMIN 4.2 11/24/2021   CALCIUM 9.3 11/24/2021  GFR 78.91 11/24/2021   Lab Results  Component Value Date   CHOL 109 11/24/2021   Lab Results  Component Value Date   HDL 42.20 11/24/2021   Lab Results  Component Value Date   LDLCALC 99 01/24/2021   Lab Results  Component Value Date   TRIG 208.0 (H) 11/24/2021   Lab Results  Component Value Date   CHOLHDL 3 11/24/2021   Lab Results  Component Value Date   HGBA1C 8.0 (H) 11/24/2021      Assessment & Plan:   Problem List Items Addressed This Visit       Cardiovascular and Mediastinum   HTN (hypertension)     Endocrine   Type 2 diabetes mellitus without complications ([place])   RESOLVED: Diabetes mellitus ([place])     Other   Hyperlipidemia, mild   Statin intolerance   Schizophrenia ([place])   No orders of the defined types were placed in this encounter.  I, [name], personally preformed the services described in this documentation.  All medical record entries made by the scribe were at my direction and in my presence.  I have reviewed the chart and discharge instructions (if applicable) and agree that the record reflects my personal performance and is accurate and complete.  05/30/2022  I,Mohammed Iqbal,acting as a scribe for Penni Homans, MD.,have documented all relevant documentation on the behalf of Penni Homans, MD,as directed by  Penni Homans, MD while in the presence of Penni Homans, MD.  [name]

## 2022-06-01 ENCOUNTER — Other Ambulatory Visit: Payer: Self-pay | Admitting: Family Medicine

## 2022-06-05 ENCOUNTER — Ambulatory Visit (INDEPENDENT_AMBULATORY_CARE_PROVIDER_SITE_OTHER): Payer: Medicare Other | Admitting: Diagnostic Neuroimaging

## 2022-06-05 DIAGNOSIS — R413 Other amnesia: Secondary | ICD-10-CM

## 2022-06-05 DIAGNOSIS — R4689 Other symptoms and signs involving appearance and behavior: Secondary | ICD-10-CM

## 2022-06-06 ENCOUNTER — Encounter: Payer: Self-pay | Admitting: Family Medicine

## 2022-06-06 ENCOUNTER — Ambulatory Visit (INDEPENDENT_AMBULATORY_CARE_PROVIDER_SITE_OTHER): Payer: Medicare Other | Admitting: *Deleted

## 2022-06-06 VITALS — BP 115/72 | HR 97 | Ht 73.0 in | Wt 250.6 lb

## 2022-06-06 DIAGNOSIS — Z0000 Encounter for general adult medical examination without abnormal findings: Secondary | ICD-10-CM

## 2022-06-06 DIAGNOSIS — Z23 Encounter for immunization: Secondary | ICD-10-CM | POA: Diagnosis not present

## 2022-06-06 NOTE — Progress Notes (Signed)
Subjective:   Joseph [name] is a 46 y.o. male who presents for Medicare Annual/Subsequent preventive examination.  Review of Systems    Defer to PCP Cardiac Risk Factors include: diabetes mellitus;hypertension;dyslipidemia;male gender     Objective:    Today's Vitals   06/06/22 1102  BP: 115/72  Pulse: 97  Weight: 250 lb 9.6 oz (113.7 kg)  Height: '6\' 1"'$  (1.854 m)   Body mass index is 33.06 kg/m.     06/06/2022   11:05 AM 06/04/2021   12:16 PM 08/05/2019    8:18 AM 03/19/2018   10:27 AM 03/19/2016    5:03 PM 06/22/2015   10:15 AM 06/22/2015   10:11 AM  Advanced Directives  Does Patient Have a Medical Advance Directive? Yes Yes No No No No No  Type of Paramedic of [place];Living will       Copy of [place] in Chart? No - copy requested No - copy requested       Would patient like information on creating a medical advance directive?   No - Patient declined Yes (MAU/Ambulatory/Procedural Areas - Information given) Yes - Educational materials given No - patient declined information No - patient declined information    Current Medications (verified) Outpatient Encounter Medications as of 06/06/2022  Medication Sig   acetaminophen (TYLENOL) 325 MG tablet 1 tab po every 8 hours as needed for pain   aspirin (ASPIR-LOW) 81 MG EC tablet TAKE ONE TABLET BY MOUTH DAILY   clonazePAM (KLONOPIN) 0.5 MG tablet Take 0.5 mg by mouth 3 (three) times daily as needed. 1/2 tab   divalproex (DEPAKOTE ER) 500 MG 24 hr tablet Take 500 mg by mouth 3 (three) times daily.   DOK 100 MG capsule TAKE ONE CAPSULE BY MOUTH TWICE A DAY   ezetimibe (ZETIA) 10 MG tablet Take 1 tablet (10 mg total) by mouth daily.   ezetimibe-simvastatin (VYTORIN) 10-10 MG tablet Take 1 tablet by mouth daily.   ibuprofen (ADVIL) 800 MG tablet TAKE 1 TABLET(800 MG) BY MOUTH EVERY 8 HOURS AS NEEDED   Lancets (ONETOUCH DELICA PLUS [id number]) MISC 1  each by Other route daily. Glucose   lisinopril (ZESTRIL) 2.5 MG tablet TAKE ONE TABLET BY MOUTH DAILY   loratadine (CLARITIN) 10 MG tablet TAKE 1 TABLET(10 MG) BY MOUTH DAILY   LYBALVI 20-10 MG TABS Take 1 tablet by mouth at bedtime.   Multiple Vitamins-Minerals (MULTIVITAMIN MEN) TABS TAKE 1 TABLET BY MOUTH EVERY DAY   OLANZapine (ZYPREXA) 2.5 MG tablet Take 2.5 mg by mouth at bedtime.   ONETOUCH VERIO test strip 1 each by Other route as needed for other (Glucose check).   polyethylene glycol powder (GLYCOLAX/MIRALAX) 17 GM/SCOOP powder MIX 17 GRAMS WITH MORNING DOSE OF BENEFIBER IN 6-8 OUNCES OF LIQUID AND DRINK BY MOUTH DAILY   PREVIDENT 0.2 % SOLN Apply 1 application  topically as needed (Unknown reason).   Wheat Dextrin (CLEAR SOLUBLE FIBER) POWD TAKE 1 DOSE BY MOUTH EVERY MORNING AND EVERY NIGHT AT BEDTIME   No facility-administered encounter medications on file as of 06/06/2022.    Allergies (verified) Aripiprazole, Haldol [haloperidol decanoate], Risperidone and related, and Statins   History: Past Medical History:  Diagnosis Date   Anemia    post operative   Antiphospholipid syndrome ([place]) 08/15/2011   Anxiety    Blood transfusion without reported diagnosis    x 2 per pt    Cellulitis    bilateral  thighs   Cerumen impaction 07/13/2013   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HCC)    Constipation in male    Depression    Diabetes ([place])    no medications    Elevated BP 10/20/2012   Enlarged thyroid 03/07/2015   H/O tobacco use, presenting hazards to health 12/01/2011   Quit October 2014    Hyperlipidemia    Lupus anticoagulant positive 08/15/2011   Medicare annual wellness visit, subsequent 03/19/2016   Obesity    Schizophrenia ([place])    Sleep apnea    wears cpap, no 02   Past Surgical History:  Procedure Laterality Date   COLONOSCOPY     eye surgery     b/l laser eye sx 2004   I & D EXTREMITY     left leg   LEG SURGERY     TONSILLECTOMY     WISDOM TOOTH EXTRACTION     Family  History  Problem Relation Age of Onset   Diverticulosis Father    Stomach cancer Mother        stomach CA dx at 67   GER disease Mother    Cancer Mother        stomach   Colon cancer Neg Hx    Pancreatic cancer Neg Hx    Esophageal cancer Neg Hx    Colon polyps Neg Hx    Rectal cancer Neg Hx    Social History   Socioeconomic History   Marital status: Single    Spouse name: Not on file   Number of children: 0   Years of education: Not on file   Highest education level: Not on file  Occupational History    Employer: [employer]    Comment: [employer]  Tobacco Use   Smoking status: Former    Packs/day: 1.00    Years: 1.00    Total pack years: 1.00    Types: Cigars, Cigarettes    Start date: 07/15/2012    Quit date: 04/25/2021    Years since quitting: 1.1   Smokeless tobacco: Former    Types: Chew   Tobacco comments:    4 cigars A DAY  Vaping Use   Vaping Use: Every day  Substance and Sexual Activity   Alcohol use: No   Drug use: No   Sexual activity: Not Currently  Other Topics Concern   Not on file  Social History Narrative   Lives in a [place] - his dad pays for it - Visits with Dad every Saturday and Sunday   Social Determinants of Health   Financial Resource Strain: Low Risk  (06/04/2021)   Overall Financial Resource Strain (CARDIA)    Difficulty of Paying Living Expenses: Not hard at all  Food Insecurity: No Food Insecurity (06/04/2021)   Hunger Vital Sign    Worried About Running Out of Food in the Last Year: Never true    [place] in the Last Year: Never true  Transportation Needs: No Transportation Needs (06/04/2021)   PRAPARE - Hydrologist (Medical): No    Lack of Transportation (Non-Medical): No  Physical Activity: Sufficiently Active (06/04/2021)   Exercise Vital Sign    Days of Exercise per Week: 7 days    Minutes of Exercise per Session: 60 min  Stress: No Stress Concern Present (06/04/2021)   [place]    Feeling of Stress : Not at all  Social Connections: Moderately  Integrated (06/04/2021)   Social Connection and Isolation Panel [NHANES]    Frequency of Communication with Friends and Family: More than three times a week    Frequency of Social Gatherings with Friends and Family: More than three times a week    Attends Religious Services: More than 4 times per year    Active Member of [employer] or Organizations: Yes    Attends Music therapist: More than 4 times per year    Marital Status: Never married    Tobacco Counseling Counseling given: Not Answered Tobacco comments: 4 cigars A DAY   Clinical Intake:  Pre-visit preparation completed: Yes  Pain : No/denies pain     Diabetes: Yes CBG done?: No Did pt. bring in CBG monitor from home?: No  How often do you need to have someone help you when you read instructions, pamphlets, or other written materials from your doctor or pharmacy?: 1 - Never  Diabetic? Yes Nutrition Risk Assessment:  Has the patient had any N/V/D within the last 2 months?  No  Does the patient have any non-healing wounds?  No  Has the patient had any unintentional weight loss or weight gain?  No   Diabetes:  Is the patient diabetic?  Yes  If diabetic, was a CBG obtained today?  No  Did the patient bring in their glucometer from home?  No  How often do you monitor your CBG's? daily.   Financial Strains and Diabetes Management:  Are you having any financial strains with the device, your supplies or your medication? No .  Does the patient want to be seen by Chronic Care Management for management of their diabetes?  No  Would the patient like to be referred to a Nutritionist or for Diabetic Management?  No   Diabetic Exams:  Diabetic Eye Exam: Overdue for diabetic eye exam. Pt has been advised about the importance in completing this exam. Patient advised to call and  schedule an eye exam. Diabetic Foot Exam: Completed 11/24/21   Interpreter Needed?: No  Information entered by :: Beatris Ship, [place]   Activities of Daily Living    06/06/2022   11:07 AM  In your present state of health, do you have any difficulty performing the following activities:  Hearing? 0  Vision? 0  Difficulty concentrating or making decisions? 0  Walking or climbing stairs? 0  Dressing or bathing? 0  Doing errands, shopping? 0  Preparing Food and eating ? Y  Comment lives in group home  Using the Toilet? N  In the past six months, have you accidently leaked urine? N  Do you have problems with loss of bowel control? N  Managing your Medications? Y  Comment lives in group home  Managing your Finances? Y  Comment father handles his finances  Housekeeping or managing your Housekeeping? Y  Comment lives in group home    Patient Care Team: Mosie Lukes, MD as PCP - General (Family Medicine) Jacelyn Pi, MD as Consulting Physician (Endocrinology) Noemi Chapel, NP as Nurse Practitioner Irene Shipper, MD as Consulting Physician (Gastroenterology) Sherryle Lis Stephan Minister, DPM as Consulting Physician (Podiatry)  Indicate any recent Medical Services you may have received from other than [facility] providers in the past year (date may be approximate).     Assessment:   This is a routine wellness examination for [facility].  Hearing/Vision screen No results found.  Dietary issues and exercise activities discussed: Current Exercise Habits: Home exercise routine, Type of exercise: walking, Time (  Minutes): 60, Frequency (Times/Week): 7, Weekly Exercise (Minutes/Week): 420, Intensity: Mild, Exercise limited by: Other - see comments   Goals Addressed   None    Depression Screen    06/06/2022   11:06 AM 11/24/2021    2:12 PM 06/04/2021   12:17 PM 08/16/2020    1:13 PM 08/05/2019    8:19 AM 03/19/2018   10:27 AM 07/02/2017   10:13 AM  PHQ 2/9 Scores  PHQ - 2 Score 0 0 0 0 0 0 0   PHQ- 9 Score  0         Fall Risk    06/06/2022   11:05 AM 11/24/2021    2:18 PM 06/04/2021   12:18 PM 08/05/2019    8:19 AM 03/19/2018   10:27 AM  [place] in the past year? 0 0 0 0 No  Number falls in past yr: 1 0 0 0   Injury with Fall? 0 0 0 0   Risk for fall due to : No Fall Risks No Fall Risks Medication side effect;Impaired vision    Follow up Falls evaluation completed Falls evaluation completed Falls prevention discussed      FALL RISK PREVENTION PERTAINING TO THE HOME:  Any stairs in or around the home? Yes  If so, are there any without handrails? No  Home free of loose throw rugs in walkways, pet beds, electrical cords, etc? Yes  Adequate lighting in your home to reduce risk of falls? Yes   ASSISTIVE DEVICES UTILIZED TO PREVENT FALLS:  Life alert? No  Use of a cane, walker or w/c? No  Grab bars in the bathroom? No  Shower chair or bench in shower? No  Elevated toilet seat or a handicapped toilet? No   TIMED UP AND GO:  Was the test performed? Yes .  Length of time to ambulate 10 feet: 10 sec.   Gait steady and fast without use of assistive device  Cognitive Function:    05/25/2022    1:23 PM 03/19/2018   10:39 AM  MMSE - Mini Mental State Exam  Not completed:  Refused  Orientation to time 3   Orientation to Place 4   Registration 3   Attention/ Calculation 5   Recall 2   Language- name 2 objects 2   Language- repeat 1   Language- follow 3 step command 3   Language- read & follow direction 1   Write a sentence 1   Copy design 1   Total score 26         06/06/2022   11:10 AM  6CIT Screen  What Year? 0 points  What month? 3 points  What time? 0 points  Count back from 20 0 points  Months in reverse 4 points  Repeat phrase 6 points  Total Score 13 points    Immunizations Immunization History  Administered Date(s) Administered   Influenza Split 06/15/2017, 06/07/2019, 05/16/2020   Influenza Whole 06/01/2013   Influenza,inj,Quad  PF,6+ Mos 06/09/2014, 06/15/2017, 05/17/2018, 06/06/2022   Influenza-Unspecified 05/11/2015, 06/07/2016, 07/05/2021   Moderna Sars-Covid-2 Vaccination 11/17/2019, 12/16/2019, 09/08/2020   PPD Test 01/04/2012   Pneumococcal Polysaccharide-23 11/04/2011   Tdap 03/21/2018    TDAP status: Up to date  Flu Vaccine status: Completed at today's visit  Pneumococcal vaccine status: Up to date  Covid-19 vaccine status: Information provided on how to obtain vaccines.   Qualifies for Shingles Vaccine? Yes   Zostavax completed No   Shingrix Completed?: No.  Education has been provided regarding the importance of this vaccine. Patient has been advised to call insurance company to determine out of pocket expense if they have not yet received this vaccine. Advised may also receive vaccine at local pharmacy or Health Dept. Verbalized acceptance and understanding.  Screening Tests Health Maintenance  Topic Date Due   OPHTHALMOLOGY EXAM  10/05/2018   COVID-19 Vaccine (4 - Moderna risk series) 11/03/2020   HEMOGLOBIN A1C  05/27/2022   FOOT EXAM  11/25/2022   TETANUS/TDAP  03/21/2028   COLONOSCOPY (Pts 45-69yr Insurance coverage will need to be confirmed)  06/07/2030   INFLUENZA VACCINE  Completed   Hepatitis C Screening  Completed   HIV Screening  Completed   HPV VACCINES  Aged Out    Health Maintenance  Health Maintenance Due  Topic Date Due   OPHTHALMOLOGY EXAM  10/05/2018   COVID-19 Vaccine (4 - Moderna risk series) 11/03/2020   HEMOGLOBIN A1C  05/27/2022    Colorectal cancer screening: Type of screening: Colonoscopy. Completed 06/07/20. Repeat every 10 years  Lung Cancer Screening: (Low Dose CT Chest recommended if Age 48-80years, 30 pack-year currently smoking OR have quit w/in 15years.) does not qualify.   Lung Cancer Screening Referral: N/a  Additional Screening:  Hepatitis C Screening: does qualify; Completed 03/16/11  Vision Screening: Recommended annual ophthalmology  exams for early detection of glaucoma and other disorders of the eye. Is the patient up to date with their annual eye exam?  No  Who is the provider or what is the name of the office in which the patient attends annual eye exams? [facility] If pt is not established with a provider, would they like to be referred to a provider to establish care? No .   Dental Screening: Recommended annual dental exams for proper oral hygiene  Community Resource Referral / Chronic Care Management: CRR required this visit?  No   CCM required this visit?  No      Plan:     I have personally reviewed and noted the following in the patient's chart:   Medical and social history Use of alcohol, tobacco or illicit drugs  Current medications and supplements including opioid prescriptions. Patient is not currently taking opioid prescriptions. Functional ability and status Nutritional status Physical activity Advanced directives List of other physicians Hospitalizations, surgeries, and ER visits in previous 12 months Vitals Screenings to include cognitive, depression, and falls Referrals and appointments  In addition, I have reviewed and discussed with patient certain preventive protocols, quality metrics, and best practice recommendations. A written personalized care plan for preventive services as well as general preventive health recommendations were provided to patient.     BBeatris Ship [place]  06/06/2022   Nurse Notes: None

## 2022-06-06 NOTE — Patient Instructions (Signed)
Joseph [name] , Thank you for taking time to come for your Medicare Wellness Visit. I appreciate your ongoing commitment to your health goals. Please review the following plan we discussed and let me know if I can assist you in the future.   These are the goals we discussed:  Goals      Maintain healthy diet and continue exercising.     Patient would like to get better at guitar - practice more        This is a list of the screening recommended for you and due dates:  Health Maintenance  Topic Date Due   Eye exam for diabetics  10/05/2018   COVID-19 Vaccine (4 - Moderna risk series) 11/03/2020   Hemoglobin A1C  05/27/2022   Complete foot exam   11/25/2022   Tetanus Vaccine  03/21/2028   Colon Cancer Screening  06/07/2030   Flu Shot  Completed   Hepatitis C Screening: USPSTF Recommendation to screen - Ages 18-79 yo.  Completed   HIV Screening  Completed   HPV Vaccine  Aged Out       Next appointment: Follow up in one year for your annual wellness visit 06/08/23 11:00am  Preventive Care 40-64 Years, Male Preventive care refers to lifestyle choices and visits with your health care provider that can promote health and wellness. What does preventive care include? A yearly physical exam. This is also called an annual well check. Dental exams once or twice a year. Routine eye exams. Ask your health care provider how often you should have your eyes checked. Personal lifestyle choices, including: Daily care of your teeth and gums. Regular physical activity. Eating a healthy diet. Avoiding tobacco and drug use. Limiting alcohol use. Practicing safe sex. Taking low-dose aspirin every day starting at age 42. What happens during an annual well check? The services and screenings done by your health care provider during your annual well check will depend on your age, overall health, lifestyle risk factors, and family history of disease. Counseling  Your health care provider may ask you  questions about your: Alcohol use. Tobacco use. Drug use. Emotional well-being. Home and relationship well-being. Sexual activity. Eating habits. Work and work Statistician. Screening  You may have the following tests or measurements: Height, weight, and BMI. Blood pressure. Lipid and cholesterol levels. These may be checked every 5 years, or more frequently if you are over 11 years old. Skin check. Lung cancer screening. You may have this screening every year starting at age 4 if you have a 30-pack-year history of smoking and currently smoke or have quit within the past 15 years. Fecal occult blood test (FOBT) of the stool. You may have this test every year starting at age 44. Flexible sigmoidoscopy or colonoscopy. You may have a sigmoidoscopy every 5 years or a colonoscopy every 10 years starting at age 30. Prostate cancer screening. Recommendations will vary depending on your family history and other risks. Hepatitis C blood test. Hepatitis B blood test. Sexually transmitted disease (STD) testing. Diabetes screening. This is done by checking your blood sugar (glucose) after you have not eaten for a while (fasting). You may have this done every 1-3 years. Discuss your test results, treatment options, and if necessary, the need for more tests with your health care provider. Vaccines  Your health care provider may recommend certain vaccines, such as: Influenza vaccine. This is recommended every year. Tetanus, diphtheria, and acellular pertussis (Tdap, Td) vaccine. You may need a Td booster every 10  years. Zoster vaccine. You may need this after age 13. Pneumococcal 13-valent conjugate (PCV13) vaccine. You may need this if you have certain conditions and have not been vaccinated. Pneumococcal polysaccharide (PPSV23) vaccine. You may need one or two doses if you smoke cigarettes or if you have certain conditions. Talk to your health care provider about which screenings and vaccines you  need and how often you need them. This information is not intended to replace advice given to you by your health care provider. Make sure you discuss any questions you have with your health care provider. Document Released: 10/08/2015 Document Revised: 05/31/2016 Document Reviewed: 07/13/2015 Elsevier Interactive Patient Education  2017 [place] Prevention in the Home Falls can cause injuries. They can happen to people of all ages. There are many things you can do to make your home safe and to help prevent falls. What can I do on the outside of my home? Regularly fix the edges of walkways and driveways and fix any cracks. Remove anything that might make you trip as you walk through a door, such as a raised step or threshold. Trim any bushes or trees on the path to your home. Use bright outdoor lighting. Clear any walking paths of anything that might make someone trip, such as rocks or tools. Regularly check to see if handrails are loose or broken. Make sure that both sides of any steps have handrails. Any raised decks and porches should have guardrails on the edges. Have any leaves, snow, or ice cleared regularly. Use sand or salt on walking paths during winter. Clean up any spills in your garage right away. This includes oil or grease spills. What can I do in the bathroom? Use night lights. Install grab bars by the toilet and in the tub and shower. Do not use towel bars as grab bars. Use non-skid mats or decals in the tub or shower. If you need to sit down in the shower, use a plastic, non-slip stool. Keep the floor dry. Clean up any water that spills on the floor as soon as it happens. Remove soap buildup in the tub or shower regularly. Attach bath mats securely with double-sided non-slip rug tape. Do not have throw rugs and other things on the floor that can make you trip. What can I do in the bedroom? Use night lights. Make sure that you have a light by your bed that is  easy to reach. Do not use any sheets or blankets that are too big for your bed. They should not hang down onto the floor. Have a firm chair that has side arms. You can use this for support while you get dressed. Do not have throw rugs and other things on the floor that can make you trip. What can I do in the kitchen? Clean up any spills right away. Avoid walking on wet floors. Keep items that you use a lot in easy-to-reach places. If you need to reach something above you, use a strong step stool that has a grab bar. Keep electrical cords out of the way. Do not use floor polish or wax that makes floors slippery. If you must use wax, use non-skid floor wax. Do not have throw rugs and other things on the floor that can make you trip. What can I do with my stairs? Do not leave any items on the stairs. Make sure that there are handrails on both sides of the stairs and use them. Fix handrails that are broken or  loose. Make sure that handrails are as long as the stairways. Check any carpeting to make sure that it is firmly attached to the stairs. Fix any carpet that is loose or worn. Avoid having throw rugs at the top or bottom of the stairs. If you do have throw rugs, attach them to the floor with carpet tape. Make sure that you have a light switch at the top of the stairs and the bottom of the stairs. If you do not have them, ask someone to add them for you. What else can I do to help prevent falls? Wear shoes that: Do not have high heels. Have rubber bottoms. Are comfortable and fit you well. Are closed at the toe. Do not wear sandals. If you use a stepladder: Make sure that it is fully opened. Do not climb a closed stepladder. Make sure that both sides of the stepladder are locked into place. Ask someone to hold it for you, if possible. Clearly mark and make sure that you can see: Any grab bars or handrails. First and last steps. Where the edge of each step is. Use tools that help you  move around (mobility aids) if they are needed. These include: Canes. Walkers. Scooters. Crutches. Turn on the lights when you go into a dark area. Replace any light bulbs as soon as they burn out. Set up your furniture so you have a clear path. Avoid moving your furniture around. If any of your floors are uneven, fix them. If there are any pets around you, be aware of where they are. Review your medicines with your doctor. Some medicines can make you feel dizzy. This can increase your chance of falling. Ask your doctor what other things that you can do to help prevent falls. This information is not intended to replace advice given to you by your health care provider. Make sure you discuss any questions you have with your health care provider. Document Released: 07/08/2009 Document Revised: 02/17/2016 Document Reviewed: 10/16/2014 Elsevier Interactive Patient Education  2017 [employer].

## 2022-06-07 DIAGNOSIS — E119 Type 2 diabetes mellitus without complications: Secondary | ICD-10-CM | POA: Diagnosis not present

## 2022-06-07 DIAGNOSIS — E663 Overweight: Secondary | ICD-10-CM | POA: Diagnosis not present

## 2022-06-07 DIAGNOSIS — I10 Essential (primary) hypertension: Secondary | ICD-10-CM | POA: Diagnosis not present

## 2022-06-07 DIAGNOSIS — E7800 Pure hypercholesterolemia, unspecified: Secondary | ICD-10-CM | POA: Diagnosis not present

## 2022-06-08 ENCOUNTER — Ambulatory Visit (INDEPENDENT_AMBULATORY_CARE_PROVIDER_SITE_OTHER): Payer: Medicare Other | Admitting: Adult Health

## 2022-06-08 ENCOUNTER — Encounter: Payer: Self-pay | Admitting: Adult Health

## 2022-06-08 DIAGNOSIS — G4733 Obstructive sleep apnea (adult) (pediatric): Secondary | ICD-10-CM

## 2022-06-08 DIAGNOSIS — F200 Paranoid schizophrenia: Secondary | ICD-10-CM | POA: Diagnosis not present

## 2022-06-08 NOTE — Progress Notes (Signed)
$'@Patient'j$  ID: Joseph [name], male    DOB: 1976/02/15, 46 y.o.   MRN: [id number]  Chief Complaint  Patient presents with   Follow-up    Referring provider: Mosie Lukes, MD  HPI: 46 yo male followed for OSA on CPAP  Lives in group home   TEST/EVENTS :  PSG 08/2006 - 240 pounds- AHI of 10 events per hour with mild oxygen desaturation to lowest of 87%. This was corrected by CPAP of 6 cm with a full face mask.    PSG 08/2013 showed AHI 62/h, predom hypopneas corrected by 12 cm  06/08/2022 Follow up: OSA  Patient returns for a 73-monthfollow-up.  Patient was seen last visit with new CPAP ordered.  His CPAP had broken.   Patient says he never got his CPAP machine.  Patient says he really does not have that much trouble sleeping anymore.  Sleeps very well.  Wakes up and feels pretty well.  Does not have any significant daytime sleepiness.  Patient says he does not drive.  Patient says he does not want to restart on CPAP.  We discussed getting a repeat sleep study to evaluate his severity of sleep apnea.  Patient declines at this time.  Patient says he actually feels pretty good.   Allergies  Allergen Reactions   Aripiprazole Other (See Comments)   Haldol [Haloperidol Decanoate]     Painful, HA   Risperidone And Related     Risperdol per pt    Statins     ? 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   Anxiety    Blood transfusion without  reported diagnosis    x 2 per pt    Cellulitis    bilateral thighs   Cerumen impaction 07/13/2013   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[place]    Constipation in male    Depression    Diabetes ([place]    no medications    Elevated BP 10/20/2012   Enlarged thyroid 03/07/2015   H/O tobacco use, presenting hazards to health 12/01/2011   Quit October 2014    Hyperlipidemia    Lupus anticoagulant positive 08/15/2011   Medicare annual wellness visit, subsequent 03/19/2016   Obesity    Schizophrenia ([place]    Sleep apnea    wears cpap, no 02    Tobacco History: Social History   Tobacco Use  Smoking Status Former   Packs/day: 1.00   Years: 1.00   Total pack years: 1.00   Types: Cigars, Cigarettes   Start date: 07/15/2012   Quit date: 04/25/2021   Years since quitting: 1.1  Smokeless Tobacco Former   Types: Chew  Tobacco Comments   4 cigars A DAY   Counseling given: Not Answered Tobacco comments: 4 cigars A DAY   Outpatient Medications Prior to Visit  Medication Sig Dispense Refill   acetaminophen (TYLENOL) 325 MG tablet 1 tab po every 8 hours as needed for pain 30 tablet 0   aspirin (ASPIR-LOW) 81 MG EC tablet TAKE ONE TABLET BY MOUTH DAILY 90 tablet 3  clonazePAM (KLONOPIN) 0.5 MG tablet Take 0.5 mg by mouth 3 (three) times daily as needed. 1/2 tab     divalproex (DEPAKOTE ER) 500 MG 24 hr tablet Take 500 mg by mouth 3 (three) times daily.     DOK 100 MG capsule TAKE ONE CAPSULE BY MOUTH TWICE A DAY 60 capsule 1   ezetimibe-simvastatin (VYTORIN) 10-10 MG tablet Take 1 tablet by mouth daily.     ibuprofen (ADVIL) 800 MG tablet TAKE 1 TABLET(800 MG) BY MOUTH EVERY 8 HOURS AS NEEDED 30 tablet 0   Lancets (ONETOUCH DELICA PLUS ZOXWRU04V) MISC 1 each by Other route daily. Glucose     lisinopril (ZESTRIL) 2.5 MG tablet TAKE ONE TABLET BY MOUTH DAILY 90 tablet 4   loratadine (CLARITIN) 10 MG tablet TAKE 1 TABLET(10 MG) BY MOUTH DAILY 90 tablet 1   LYBALVI 20-10 MG TABS Take 1 tablet by mouth at  bedtime.     Multiple Vitamins-Minerals (MULTIVITAMIN MEN) TABS TAKE 1 TABLET BY MOUTH EVERY DAY 90 tablet 3   OLANZapine (ZYPREXA) 2.5 MG tablet Take 2.5 mg by mouth at bedtime.     ONETOUCH VERIO test strip 1 each by Other route as needed for other (Glucose check).     polyethylene glycol powder (GLYCOLAX/MIRALAX) 17 GM/SCOOP powder MIX 17 GRAMS WITH MORNING DOSE OF BENEFIBER IN 6-8 OUNCES OF LIQUID AND DRINK BY MOUTH DAILY 255 g 5   PREVIDENT 0.2 % SOLN Apply 1 application  topically as needed (Unknown reason).     Wheat Dextrin (CLEAR SOLUBLE FIBER) POWD TAKE 1 DOSE BY MOUTH EVERY MORNING AND EVERY NIGHT AT BEDTIME 477 g 5   ezetimibe (ZETIA) 10 MG tablet Take 1 tablet (10 mg total) by mouth daily. 90 tablet 1   No facility-administered medications prior to visit.     Review of Systems:   Constitutional:   No  weight loss, night sweats,  Fevers, chills, fatigue, or  lassitude.  HEENT:   No headaches,  Difficulty swallowing,  Tooth/dental problems, or  Sore throat,                No sneezing, itching, ear ache, nasal congestion, post nasal drip,   CV:  No chest pain,  Orthopnea, PND, swelling in lower extremities, anasarca, dizziness, palpitations, syncope.   GI  No heartburn, indigestion, abdominal pain, nausea, vomiting, diarrhea, change in bowel habits, loss of appetite, bloody stools.   Resp: No shortness of breath with exertion or at rest.  No excess mucus, no productive cough,  No non-productive cough,  No coughing up of blood.  No change in color of mucus.  No wheezing.  No chest wall deformity  Skin: no rash or lesions.  GU: no dysuria, change in color of urine, no urgency or frequency.  No flank pain, no hematuria   MS:  No joint pain or swelling.  No decreased range of motion.  No back pain.    Physical Exam  BP 120/66 (BP Location: Left Arm, Patient Position: Sitting, Cuff Size: Normal)   Pulse 88   Temp 98.5 F (36.9 C) (Oral)   Ht '6\' 1"'$  (1.854 m)   Wt 255 lb  12.8 oz (116 kg)   SpO2 97%   BMI 33.75 kg/m   GEN: A/Ox3; pleasant , NAD, well nourished    HEENT:  [place]/AT,  NOSE-clear, THROAT-clear, no lesions, no postnasal drip or exudate noted.  Class [name] MP airway  NECK:  Supple w/ fair ROM; no JVD; normal carotid  impulses w/o bruits; no thyromegaly or nodules palpated; no lymphadenopathy.    RESP  Clear  P & A; w/o, wheezes/ rales/ or rhonchi. no accessory muscle use, no dullness to percussion  CARD:  RRR, no m/r/g, no peripheral edema, pulses intact, no cyanosis or clubbing.  GI:   Soft & nt; nml bowel sounds; no organomegaly or masses detected.   Musco: Warm bil, no deformities or joint swelling noted.   Neuro: alert, no focal deficits noted.    Skin: Warm, no lesions or rashes    Lab Results:  CBC   BNP   Imaging: No results found.        No data to display          No results found for: "NITRICOXIDE"      Assessment & Plan:   OSA (obstructive sleep apnea) Severe sleep apnea-unfortunately patient has been intolerant to CPAP in the past.  Most recently CPAP machine had broken and the patient did not receive a new CPAP machine as recommended.  We discussed getting a repeat sleep study and restarting CPAP.  Patient declines at this time and says he does not want [place] to restart on CPAP.  We went over potential complications of sleep apnea including cardiovascular risk factors.  Patient says that he feels well and does not have any significant daytime sleepiness.  Advised that if his symptoms worsen he is to contact us   - discussed how weight can impact sleep and risk for sleep disordered breathing - discussed options to assist with weight loss: combination of diet modification, cardiovascular and strength training exercises   - had an extensive discussion regarding the adverse health consequences related to untreated sleep disordered breathing - specifically discussed the risks for hypertension, coronary artery  disease, cardiac dysrhythmias, cerebrovascular disease, and diabetes - lifestyle modification discussed   - discussed how sleep disruption can increase risk of accidents, particularly when driving - safe driving practices were discussed   Plan  Patient Instructions  Healthy sleep regimen  Work on healthy weight loss . Do not drive if sleepy  If you develop daytime sleepiness or restless sleep , return back and we can set up for sleep study to reevaluate for sleep apnea.  Follow up with Dr. Elsworth Soho As needed         Rexene Edison, NP 06/08/2022

## 2022-06-08 NOTE — Assessment & Plan Note (Signed)
Severe sleep apnea-unfortunately patient has been intolerant to CPAP in the past.  Most recently CPAP machine had broken and the patient did not receive a new CPAP machine as recommended.  We discussed getting a repeat sleep study and restarting CPAP.  Patient declines at this time and says he does not want [place] to restart on CPAP.  We went over potential complications of sleep apnea including cardiovascular risk factors.  Patient says that he feels well and does not have any significant daytime sleepiness.  Advised that if his symptoms worsen he is to contact us   - discussed how weight can impact sleep and risk for sleep disordered breathing - discussed options to assist with weight loss: combination of diet modification, cardiovascular and strength training exercises   - had an extensive discussion regarding the adverse health consequences related to untreated sleep disordered breathing - specifically discussed the risks for hypertension, coronary artery disease, cardiac dysrhythmias, cerebrovascular disease, and diabetes - lifestyle modification discussed   - discussed how sleep disruption can increase risk of accidents, particularly when driving - safe driving practices were discussed   Plan  Patient Instructions  Healthy sleep regimen  Work on healthy weight loss . Do not drive if sleepy  If you develop daytime sleepiness or restless sleep , return back and we can set up for sleep study to reevaluate for sleep apnea.  Follow up with Dr. Elsworth Soho As needed

## 2022-06-08 NOTE — Patient Instructions (Signed)
Healthy sleep regimen  Work on healthy weight loss . Do not drive if sleepy  If you develop daytime sleepiness or restless sleep , return back and we can set up for sleep study to reevaluate for sleep apnea.  Follow up with Dr. Elsworth Soho As needed

## 2022-06-09 NOTE — Procedures (Signed)
   [facility]  EEG (ELECTROENCEPHALOGRAM) REPORT   STUDY DATE: 06/05/22 PATIENT NAME: Joseph [name] DOB: [birth year] MRN: [id number]  ORDERING CLINICIAN: Andrey Spearman, MD   TECHNOLOGIST: Myer Peer TECHNIQUE: Electroencephalogram was recorded utilizing standard 10-20 system of lead placement and reformatted into average and bipolar montages.  RECORDING TIME: 24 minutes ACTIVATION: hyperventilation and photic stimulation  CLINICAL INFORMATION: 46 year old male with abnormal spells.  FINDINGS: Posterior dominant background rhythms, which attenuate with eye opening, ranging 12-17 hertz and 20-25 microvolts. No focal, lateralizing, epileptiform activity or seizures are seen. Patient recorded in the awake state. EKG channel shows regular rhythm of 70-75 beats per minute.   IMPRESSION:   Unremarkable EEG in the awake state.  EEG beta activity could be related to medication side effect.    INTERPRETING PHYSICIAN:  Penni Bombard, MD Certified in Neurology, Neurophysiology and Neuroimaging  [facility] [address] [place], [place] [address] [phone]

## 2022-06-12 ENCOUNTER — Telehealth: Payer: Self-pay

## 2022-06-12 NOTE — Telephone Encounter (Signed)
-----   Message from Penni Bombard, MD sent at 06/11/2022  3:42 PM EDT ----- Unremarkable EEG. Please call patient. Continue current plan. -VRP

## 2022-06-12 NOTE — Telephone Encounter (Signed)
Called pt and spoke with Jenel Lucks per DPR and relayed EEG results with her she understood and appreciated the call.

## 2022-06-19 DIAGNOSIS — F200 Paranoid schizophrenia: Secondary | ICD-10-CM | POA: Diagnosis not present

## 2022-06-20 ENCOUNTER — Telehealth: Payer: Self-pay | Admitting: Family Medicine

## 2022-06-20 DIAGNOSIS — E785 Hyperlipidemia, unspecified: Secondary | ICD-10-CM

## 2022-06-20 MED ORDER — EZETIMIBE 10 MG PO TABS: 10.0000 mg | ORAL_TABLET | Freq: Every day | ORAL | 1 refills | Status: DC

## 2022-06-20 MED ORDER — LORATADINE 10 MG PO TABS: ORAL_TABLET | ORAL | 1 refills | Status: DC

## 2022-06-20 NOTE — Telephone Encounter (Signed)
Medication:  loratadine (CLARITIN) 10 MG tablet  Has the patient contacted their pharmacy? No.  Preferred Pharmacy (with phone number or street name):  [facility], [place], [place] [address] Phone: [phone]

## 2022-06-20 NOTE — Telephone Encounter (Signed)
Refill sent to pharmacy. Caregiver notified.

## 2022-06-20 NOTE — Telephone Encounter (Signed)
Medication: ezetimibe (ZETIA) 10 MG tablet [[id number]]  ENDED  Medication shows discontinued on list.   Preferred Pharmacy (with phone number or street name): [facility]; [phone]  Agent: Please be advised that RX refills may take up to 3 business days. We ask that you follow-up with your pharmacy.

## 2022-06-20 NOTE — Telephone Encounter (Signed)
Current med list had zetia and ezetimibe/simvastatin (vytorin) on it.  Spoke with caregiver and verified that pt is only taking zetia. Removed vytorin from med list and sent refill for zetia. Caregiver notified.

## 2022-06-23 ENCOUNTER — Telehealth: Payer: Self-pay | Admitting: Family Medicine

## 2022-06-23 NOTE — Telephone Encounter (Signed)
Is it okay?

## 2022-06-23 NOTE — Telephone Encounter (Signed)
[facility] called for the patient stating he had just changed pharmacies and they do not carry vitamins. They would like an ok for them to be able to give him OTC vitamins and the brand dosage and instructions on how to give it to him. Please advise.   [phone]

## 2022-06-27 ENCOUNTER — Ambulatory Visit (INDEPENDENT_AMBULATORY_CARE_PROVIDER_SITE_OTHER): Payer: Medicare Other | Admitting: Orthopedic Surgery

## 2022-06-27 DIAGNOSIS — M21372 Foot drop, left foot: Secondary | ICD-10-CM | POA: Diagnosis not present

## 2022-06-27 DIAGNOSIS — M21371 Foot drop, right foot: Secondary | ICD-10-CM

## 2022-06-27 NOTE — Telephone Encounter (Signed)
Left message for patient to call back to see what brand he has been taking.  Number listed below for [facility] and main number for patient is a fax number.

## 2022-06-30 NOTE — Telephone Encounter (Signed)
Number left was fax number as so the patients phone number.  I did call the mobile number listed and patient has not responded. Can just recommend one for patient?  He will be in for office visit on 07/25/22 looks like if we can review then.

## 2022-07-04 ENCOUNTER — Encounter: Payer: Self-pay | Admitting: Orthopedic Surgery

## 2022-07-04 NOTE — Progress Notes (Signed)
Office Visit Note   Patient: Joseph [name]           Date of Birth: [birth year]           MRN: [id number] Visit Date: 06/27/2022              Requested by: Mosie Lukes, MD [address] [place],  [place] [address] PCP: Mosie Lukes, MD  Chief Complaint  Patient presents with   Right Leg - Pain   Left Leg - Pain      HPI: Patient is a 46 year old gentleman who is seen for evaluation for bilateral leg pain.  Patient states the pain started about a week ago.  He has a history of foot drop history of fasciotomies.  Patient stopped using his AFOs in June due to causing abrasions anteriorly over the tibia.  He is currently wearing a Vive compression socks to XL.  Patient states his bilateral anterior leg pain was so bad he could not walk recently.  Assessment & Plan: Visit Diagnoses:  1. Foot drop, bilateral     Plan: Recommend resuming his AFO braces.  Patient may require new braces.  Follow-Up Instructions: Return if symptoms worsen or fail to improve.   Ortho Exam  Patient is alert, oriented, no adenopathy, well-dressed, normal affect, normal respiratory effort. Examination the knee joints are not tender to palpation no venous ulcers.  There is no effusion in either knee.  He does have persistent foot drop bilaterally.  Patient has pain to palpation of the anterior compartment.  Most likely his pain is due to muscle weakness with the anterior compartment having insufficient strength to raise his foot.  Imaging: No results found. No images are attached to the encounter.  Labs: Lab Results  Component Value Date   HGBA1C 8.0 (H) 11/24/2021   HGBA1C 6.6 (H) 05/16/2021   HGBA1C 6.9 (H) 01/24/2021   REPTSTATUS 11/04/2011 FINAL 11/04/2011   REPTSTATUS 11/07/2011 FINAL 11/04/2011   GRAMSTAIN  11/04/2011    FEW WBC PRESENT, PREDOMINANTLY PMN NO SQUAMOUS EPITHELIAL CELLS SEEN NO ORGANISMS SEEN   CULT NORMAL OROPHARYNGEAL FLORA 11/04/2011   LABORGA NO  GROWTH 03/01/2015     Lab Results  Component Value Date   ALBUMIN 4.2 11/24/2021   ALBUMIN 4.0 05/16/2021   ALBUMIN 4.3 01/24/2021    Lab Results  Component Value Date   MG 2.2 03/17/2011   MG 2.1 03/16/2011   No results found for: "VD25OH"  No results found for: "PREALBUMIN"    Latest Ref Rng & Units 11/24/2021    2:51 PM 05/16/2021   11:13 AM 01/24/2021   10:36 AM  CBC EXTENDED  WBC 4.0 - 10.5 K/uL 7.8  7.1  6.6   RBC 4.22 - 5.81 Mil/uL 4.20  4.25  4.38   Hemoglobin 13.0 - 17.0 g/dL 13.8  13.9  14.5   HCT 39.0 - 52.0 % 40.8  41.4  42.8   Platelets 150.0 - 400.0 K/uL 232.0  211.0  238.0   NEUT# 1.4 - 7.7 K/uL 4.1  3.2    Lymph# 0.7 - 4.0 K/uL 2.4  2.8       There is no height or weight on file to calculate BMI.  Orders:  No orders of the defined types were placed in this encounter.  No orders of the defined types were placed in this encounter.    Procedures: No procedures performed  Clinical Data: No additional findings.  ROS:  All other systems negative, except as noted in the HPI. Review of Systems  Objective: Vital Signs: There were no vitals taken for this visit.  Specialty Comments:  No specialty comments available.  PMFS History: Patient Active Problem List   Diagnosis Date Noted   Overweight 06/04/2021   Type 2 diabetes mellitus without complications ([place]) 17/40/8144   TB lung, latent 06/02/2021   Anemia 05/23/2021   Cellulitis 05/23/2021   Depression 05/23/2021   Constipation 08/16/2020   Heel spur, right 08/16/2020   Statin intolerance 02/11/2020   Change in bowel habits 08/05/2019   Blood in stool 08/05/2019   Urinary hesitancy 08/05/2019   Medicare annual wellness visit, subsequent 03/19/2016   Morbid obesity ([place]) 06/14/2015   Enlarged thyroid 03/07/2015   PPD positive 02/08/2014   Cerumen impaction 07/13/2013   Foot drop, bilateral 12/03/2012   HTN (hypertension) 10/20/2012   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    Abnormal liver enzymes 01/14/2012   Hyperlipidemia, mild 12/01/2011   OSA (obstructive sleep apnea) 12/01/2011   Vaping nicotine dependence, non-tobacco product 12/01/2011   Schizophrenia ([place]) 12/01/2011   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[place]) 11/12/2011   Schizoaffective disorder ([place]) 11/04/2011   Iron deficiency anemia 08/15/2011   Lupus anticoagulant positive 08/15/2011   Anxiety 08/15/2011   Past Medical History:  Diagnosis Date   Anemia    post operative   Antiphospholipid syndrome ([place]) 08/15/2011   Anxiety    Blood transfusion without reported diagnosis    x 2 per pt    Cellulitis    bilateral thighs   Cerumen impaction 07/13/2013   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[place])    Constipation in male    Depression    Diabetes ([place])    no medications    Elevated BP 10/20/2012   Enlarged thyroid 03/07/2015   H/O tobacco use, presenting hazards to health 12/01/2011   Quit October 2014    Hyperlipidemia    Lupus anticoagulant positive 08/15/2011   Medicare annual wellness visit, subsequent 03/19/2016   Obesity    Schizophrenia ([place])    Sleep apnea    wears cpap, no 02    Family History  Problem Relation Age of Onset   Diverticulosis Father    Stomach cancer Mother        stomach CA dx at 35   GER disease Mother    Cancer Mother        stomach   Colon cancer Neg Hx    Pancreatic cancer Neg Hx    Esophageal cancer Neg Hx    Colon polyps Neg Hx    Rectal cancer Neg Hx     Past Surgical History:  Procedure Laterality Date   COLONOSCOPY     eye surgery     b/l laser eye sx 2004   I & D EXTREMITY     left leg   LEG SURGERY     TONSILLECTOMY     WISDOM TOOTH EXTRACTION     Social History   Occupational History    Employer: [employer]    Comment: [employer]  Tobacco Use   Smoking status: Former    Packs/day: 1.00    Years: 1.00    Total pack years: 1.00    Types: Cigars, Cigarettes    Start date: 07/15/2012    Quit date: 04/25/2021    Years since quitting: 1.1    Smokeless tobacco: Former    Types: Chew   Tobacco comments:    4 cigars A DAY  Vaping Use   Vaping Use: Every day  Substance and Sexual Activity   Alcohol use: No   Drug use: No   Sexual activity: Not Currently

## 2022-07-10 ENCOUNTER — Ambulatory Visit
Admission: RE | Admit: 2022-07-10 | Discharge: 2022-07-10 | Disposition: A | Discharge status: Home / Self Care | Payer: Medicare Other | Source: Ambulatory Visit | Attending: Diagnostic Neuroimaging | Admitting: Diagnostic Neuroimaging

## 2022-07-10 ENCOUNTER — Other Ambulatory Visit: Payer: Self-pay | Admitting: Family Medicine

## 2022-07-10 DIAGNOSIS — R4689 Other symptoms and signs involving appearance and behavior: Secondary | ICD-10-CM

## 2022-07-10 DIAGNOSIS — F200 Paranoid schizophrenia: Secondary | ICD-10-CM | POA: Diagnosis not present

## 2022-07-10 DIAGNOSIS — R413 Other amnesia: Secondary | ICD-10-CM

## 2022-07-10 MED ORDER — GADOPICLENOL 0.5 MMOL/ML IV SOLN
10.0000 mL | Freq: Once | INTRAVENOUS | Status: AC | PRN
  Administered 2022-07-10: 10 mL via INTRAVENOUS

## 2022-07-11 ENCOUNTER — Other Ambulatory Visit: Payer: Self-pay

## 2022-07-11 ENCOUNTER — Telehealth: Payer: Self-pay | Admitting: Family Medicine

## 2022-07-11 NOTE — Telephone Encounter (Signed)
[facility] is calling to update the patient's pharmacy preference since they have had issues filling his medications.   New pharmacy: [facility]  [address] [place] [address] [phone]

## 2022-07-11 NOTE — Telephone Encounter (Signed)
Updated pt pharmacy

## 2022-07-14 ENCOUNTER — Other Ambulatory Visit: Payer: Self-pay

## 2022-07-14 ENCOUNTER — Emergency Department (HOSPITAL_COMMUNITY): Payer: Medicare Other

## 2022-07-14 ENCOUNTER — Encounter (HOSPITAL_COMMUNITY): Payer: Self-pay

## 2022-07-14 ENCOUNTER — Observation Stay (HOSPITAL_COMMUNITY)
Admission: EM | Admit: 2022-07-14 | Discharge: 2022-07-15 | Disposition: A | Discharge status: Home / Self Care | Payer: Medicare Other | Attending: Internal Medicine | Admitting: Internal Medicine

## 2022-07-14 ENCOUNTER — Observation Stay (HOSPITAL_BASED_OUTPATIENT_CLINIC_OR_DEPARTMENT_OTHER): Payer: Medicare Other

## 2022-07-14 DIAGNOSIS — R262 Difficulty in walking, not elsewhere classified: Secondary | ICD-10-CM | POA: Insufficient documentation

## 2022-07-14 DIAGNOSIS — Z7982 Long term (current) use of aspirin: Secondary | ICD-10-CM | POA: Insufficient documentation

## 2022-07-14 DIAGNOSIS — Z7984 Long term (current) use of oral hypoglycemic drugs: Secondary | ICD-10-CM | POA: Diagnosis not present

## 2022-07-14 DIAGNOSIS — R0902 Hypoxemia: Secondary | ICD-10-CM | POA: Diagnosis not present

## 2022-07-14 DIAGNOSIS — Z87891 Personal history of nicotine dependence: Secondary | ICD-10-CM | POA: Diagnosis not present

## 2022-07-14 DIAGNOSIS — G4733 Obstructive sleep apnea (adult) (pediatric): Secondary | ICD-10-CM | POA: Diagnosis present

## 2022-07-14 DIAGNOSIS — Z79899 Other long term (current) drug therapy: Secondary | ICD-10-CM | POA: Diagnosis not present

## 2022-07-14 DIAGNOSIS — F259 Schizoaffective disorder, unspecified: Secondary | ICD-10-CM | POA: Diagnosis present

## 2022-07-14 DIAGNOSIS — R55 Syncope and collapse: Principal | ICD-10-CM | POA: Diagnosis present

## 2022-07-14 DIAGNOSIS — K5900 Constipation, unspecified: Secondary | ICD-10-CM | POA: Diagnosis present

## 2022-07-14 DIAGNOSIS — R410 Disorientation, unspecified: Secondary | ICD-10-CM | POA: Diagnosis not present

## 2022-07-14 DIAGNOSIS — E7220 Disorder of urea cycle metabolism, unspecified: Secondary | ICD-10-CM | POA: Diagnosis present

## 2022-07-14 DIAGNOSIS — R531 Weakness: Secondary | ICD-10-CM | POA: Diagnosis not present

## 2022-07-14 DIAGNOSIS — I959 Hypotension, unspecified: Secondary | ICD-10-CM | POA: Diagnosis not present

## 2022-07-14 DIAGNOSIS — F32A Depression, unspecified: Secondary | ICD-10-CM | POA: Diagnosis present

## 2022-07-14 DIAGNOSIS — F419 Anxiety disorder, unspecified: Secondary | ICD-10-CM | POA: Diagnosis present

## 2022-07-14 DIAGNOSIS — R9431 Abnormal electrocardiogram [ECG] [EKG]: Secondary | ICD-10-CM | POA: Diagnosis not present

## 2022-07-14 DIAGNOSIS — I10 Essential (primary) hypertension: Secondary | ICD-10-CM | POA: Diagnosis not present

## 2022-07-14 DIAGNOSIS — E119 Type 2 diabetes mellitus without complications: Secondary | ICD-10-CM | POA: Diagnosis not present

## 2022-07-14 LAB — BLOOD GAS, VENOUS
Acid-base deficit: 4.8 mmol/L — ABNORMAL HIGH (ref 0.0–2.0)
Bicarbonate: 21.1 mmol/L (ref 20.0–28.0)
O2 Saturation: 89.3 %
Patient temperature: 37
pCO2, Ven: 41 mmHg — ABNORMAL LOW (ref 44–60)
pH, Ven: 7.32 (ref 7.25–7.43)
pO2, Ven: 63 mmHg — ABNORMAL HIGH (ref 32–45)

## 2022-07-14 LAB — COMPREHENSIVE METABOLIC PANEL
ALT: 27 U/L (ref 0–44)
AST: 23 U/L (ref 15–41)
Albumin: 3.7 g/dL (ref 3.5–5.0)
Alkaline Phosphatase: 41 U/L (ref 38–126)
Anion gap: 11 (ref 5–15)
BUN: 8 mg/dL (ref 6–20)
CO2: 20 mmol/L — ABNORMAL LOW (ref 22–32)
Calcium: 8.5 mg/dL — ABNORMAL LOW (ref 8.9–10.3)
Chloride: 110 mmol/L (ref 98–111)
Creatinine, Ser: 0.9 mg/dL (ref 0.61–1.24)
GFR, Estimated: >60 mL/min (ref >60)
Glucose, Bld: 117 mg/dL — ABNORMAL HIGH (ref 70–99)
Potassium: 3.8 mmol/L (ref 3.5–5.1)
Sodium: 141 mmol/L (ref 135–145)
Total Bilirubin: 0.4 mg/dL (ref 0.3–1.2)
Total Protein: 6.3 g/dL — ABNORMAL LOW (ref 6.5–8.1)

## 2022-07-14 LAB — GLUCOSE, CAPILLARY
Glucose-Capillary: 107 mg/dL — ABNORMAL HIGH (ref 70–99)
Glucose-Capillary: 137 mg/dL — ABNORMAL HIGH (ref 70–99)

## 2022-07-14 LAB — CBC WITH DIFFERENTIAL/PLATELET
Abs Immature Granulocytes: 0.04 10*3/uL (ref 0.00–0.07)
Basophils Absolute: 0.1 10*3/uL (ref 0.0–0.1)
Basophils Relative: 1 %
Eosinophils Absolute: 0.4 10*3/uL (ref 0.0–0.5)
Eosinophils Relative: 5 %
HCT: 41.7 % (ref 39.0–52.0)
Hemoglobin: 13.6 g/dL (ref 13.0–17.0)
Immature Granulocytes: 1 %
Lymphocytes Relative: 41 %
Lymphs Abs: 3.5 10*3/uL (ref 0.7–4.0)
MCH: 33.4 pg (ref 26.0–34.0)
MCHC: 32.6 g/dL (ref 30.0–36.0)
MCV: 102.5 fL — ABNORMAL HIGH (ref 80.0–100.0)
Monocytes Absolute: 0.7 10*3/uL (ref 0.1–1.0)
Monocytes Relative: 9 %
Neutro Abs: 3.4 10*3/uL (ref 1.7–7.7)
Neutrophils Relative %: 43 %
Platelets: 283 10*3/uL (ref 150–400)
RBC: 4.07 MIL/uL — ABNORMAL LOW (ref 4.22–5.81)
RDW: 12.9 % (ref 11.5–15.5)
WBC: 8 10*3/uL (ref 4.0–10.5)
nRBC: 0 % (ref 0.0–0.2)

## 2022-07-14 LAB — SALICYLATE LEVEL: Salicylate Lvl: <7 mg/dL — ABNORMAL LOW (ref 7.0–30.0)

## 2022-07-14 LAB — RAPID URINE DRUG SCREEN, HOSP PERFORMED
Amphetamines: NOT DETECTED
Barbiturates: NOT DETECTED
Benzodiazepines: NOT DETECTED
Cocaine: NOT DETECTED
Opiates: NOT DETECTED
Tetrahydrocannabinol: NOT DETECTED

## 2022-07-14 LAB — VALPROIC ACID LEVEL: Valproic Acid Lvl: 26 ug/mL — ABNORMAL LOW (ref 50.0–100.0)

## 2022-07-14 LAB — TSH: TSH: 1.721 u[IU]/mL (ref 0.350–4.500)

## 2022-07-14 LAB — ACETAMINOPHEN LEVEL: Acetaminophen (Tylenol), Serum: <10 ug/mL — ABNORMAL LOW (ref 10–30)

## 2022-07-14 LAB — AMMONIA: Ammonia: 98 umol/L — ABNORMAL HIGH (ref 9–35)

## 2022-07-14 LAB — MAGNESIUM: Magnesium: 2.2 mg/dL (ref 1.7–2.4)

## 2022-07-14 LAB — ETHANOL: Alcohol, Ethyl (B): 204 mg/dL — ABNORMAL HIGH (ref <10)

## 2022-07-14 LAB — CBG MONITORING, ED: Glucose-Capillary: 126 mg/dL — ABNORMAL HIGH (ref 70–99)

## 2022-07-14 LAB — PHOSPHORUS: Phosphorus: 3.3 mg/dL (ref 2.5–4.6)

## 2022-07-14 MED ORDER — LORATADINE 10 MG PO TABS
10.0000 mg | ORAL_TABLET | Freq: Every day | ORAL | Status: DC
  Administered 2022-07-14 – 2022-07-15 (×2): 10 mg via ORAL
  Filled 2022-07-14 (×2): qty 1

## 2022-07-14 MED ORDER — SODIUM CHLORIDE 0.9 % IV BOLUS
1000.0000 mL | Freq: Once | INTRAVENOUS | Status: AC
  Administered 2022-07-14: 1000 mL via INTRAVENOUS

## 2022-07-14 MED ORDER — ONDANSETRON HCL 4 MG/2ML IJ SOLN: 4.0000 mg | Freq: Four times a day (QID) | INTRAMUSCULAR | Status: DC | PRN

## 2022-07-14 MED ORDER — ONDANSETRON HCL 4 MG PO TABS: 4.0000 mg | ORAL_TABLET | Freq: Four times a day (QID) | ORAL | Status: DC | PRN

## 2022-07-14 MED ORDER — POLYETHYLENE GLYCOL 3350 17 G PO PACK
17.0000 g | PACK | Freq: Every day | ORAL | Status: DC
  Administered 2022-07-14: 17 g via ORAL
  Filled 2022-07-14: qty 1

## 2022-07-14 MED ORDER — LACTULOSE 10 GM/15ML PO SOLN
30.0000 g | Freq: Three times a day (TID) | ORAL | Status: DC
  Administered 2022-07-14 – 2022-07-15 (×3): 30 g via ORAL
  Filled 2022-07-14 (×3): qty 45

## 2022-07-14 MED ORDER — DIPHENHYDRAMINE HCL 50 MG/ML IJ SOLN
25.0000 mg | Freq: Once | INTRAMUSCULAR | Status: AC
  Administered 2022-07-14: 25 mg via INTRAVENOUS
  Filled 2022-07-14: qty 1

## 2022-07-14 MED ORDER — DIVALPROEX SODIUM ER 500 MG PO TB24
1000.0000 mg | ORAL_TABLET | Freq: Every day | ORAL | Status: DC
  Administered 2022-07-14: 1000 mg via ORAL
  Filled 2022-07-14: qty 2

## 2022-07-14 MED ORDER — MAGNESIUM SULFATE 2 GM/50ML IV SOLN
2.0000 g | Freq: Once | INTRAVENOUS | Status: AC
  Administered 2022-07-14: 2 g via INTRAVENOUS
  Filled 2022-07-14: qty 50

## 2022-07-14 MED ORDER — METFORMIN HCL 500 MG PO TABS
1000.0000 mg | ORAL_TABLET | Freq: Two times a day (BID) | ORAL | Status: DC
  Administered 2022-07-14 – 2022-07-15 (×2): 1000 mg via ORAL
  Filled 2022-07-14 (×2): qty 2

## 2022-07-14 MED ORDER — LISINOPRIL 2.5 MG PO TABS
2.5000 mg | ORAL_TABLET | Freq: Every day | ORAL | Status: DC
  Administered 2022-07-14 – 2022-07-15 (×2): 2.5 mg via ORAL
  Filled 2022-07-14 (×2): qty 1

## 2022-07-14 MED ORDER — DIVALPROEX SODIUM ER 500 MG PO TB24
500.0000 mg | ORAL_TABLET | Freq: Every day | ORAL | Status: DC
  Administered 2022-07-14 – 2022-07-15 (×2): 500 mg via ORAL
  Filled 2022-07-14 (×2): qty 1

## 2022-07-14 MED ORDER — EMPAGLIFLOZIN-METFORMIN HCL 12.5-1000 MG PO TABS: 1.0000 | ORAL_TABLET | Freq: Two times a day (BID) | ORAL | Status: DC

## 2022-07-14 MED ORDER — OLANZAPINE 5 MG PO TABS: 2.5000 mg | ORAL_TABLET | Freq: Every day | ORAL | Status: DC | PRN

## 2022-07-14 MED ORDER — LACTULOSE ENEMA: 300.0000 mL | Freq: Two times a day (BID) | ORAL | Status: DC | PRN

## 2022-07-14 MED ORDER — DIVALPROEX SODIUM ER 500 MG PO TB24: 500.0000 mg | ORAL_TABLET | ORAL | Status: DC

## 2022-07-14 MED ORDER — ROSUVASTATIN CALCIUM 20 MG PO TABS
10.0000 mg | ORAL_TABLET | Freq: Every day | ORAL | Status: DC
  Administered 2022-07-14 – 2022-07-15 (×2): 10 mg via ORAL
  Filled 2022-07-14 (×2): qty 1

## 2022-07-14 MED ORDER — EZETIMIBE 10 MG PO TABS
10.0000 mg | ORAL_TABLET | Freq: Every day | ORAL | Status: DC
  Administered 2022-07-14 – 2022-07-15 (×2): 10 mg via ORAL
  Filled 2022-07-14 (×2): qty 1

## 2022-07-14 MED ORDER — DEXTROSE-NACL 5-0.45 % IV SOLN: INTRAVENOUS | Status: AC

## 2022-07-14 MED ORDER — CLONAZEPAM 0.5 MG PO TABS
1.0000 mg | ORAL_TABLET | Freq: Two times a day (BID) | ORAL | Status: DC
  Administered 2022-07-14 – 2022-07-15 (×2): 1 mg via ORAL
  Filled 2022-07-14 (×2): qty 2

## 2022-07-14 MED ORDER — POLYETHYLENE GLYCOL 3350 17 GM/SCOOP PO POWD: 1.0000 | Freq: Every day | ORAL | Status: DC

## 2022-07-14 MED ORDER — EMPAGLIFLOZIN 25 MG PO TABS
25.0000 mg | ORAL_TABLET | Freq: Every day | ORAL | Status: DC
  Administered 2022-07-14 – 2022-07-15 (×2): 25 mg via ORAL
  Filled 2022-07-14 (×2): qty 1

## 2022-07-14 MED ORDER — FLUOXETINE HCL 20 MG PO CAPS
20.0000 mg | ORAL_CAPSULE | Freq: Every day | ORAL | Status: DC
  Administered 2022-07-14 – 2022-07-15 (×2): 20 mg via ORAL
  Filled 2022-07-14 (×2): qty 1

## 2022-07-14 MED ORDER — ASPIRIN 81 MG PO TBEC
81.0000 mg | DELAYED_RELEASE_TABLET | Freq: Every day | ORAL | Status: DC
  Administered 2022-07-14 – 2022-07-15 (×2): 81 mg via ORAL
  Filled 2022-07-14 (×2): qty 1

## 2022-07-14 NOTE — Progress Notes (Signed)
Carotid duplex has been completed.   Results can be found under chart review under CV PROC. 07/14/2022 4:44 PM Stanisha Lorenz RVT, RDMS

## 2022-07-14 NOTE — Progress Notes (Signed)
Pt refused cpap that was offered tonight. He said the last time he wore one was 2 years ago. Encouraged to let RN know if he changes his mind.

## 2022-07-14 NOTE — H&P (Signed)
History and Physical    Patient: Joseph [name] XBJ:[id number] DOB: [birth year] DOA: 07/14/2022 DOS: the patient was seen and examined on 07/14/2022 PCP: Mosie Lukes, MD  Patient coming from: Home  Chief Complaint:  Chief Complaint  Patient presents with   Loss of Consciousness   HPI: Joseph [name] is a 46 y.o. male with medical history significant of postoperative anemia, antiphospholipid syndrome, anxiety, depression, schizophrenia, cellulitis of both sites, compartment syndrome, cerumen impaction, hypertension, hyperlipidemia, former tobacco use, class II obesity, sleep apnea on CPAP, grade 1 diastolic dysfunction who was brought to the emergency department after he was found diaphoretic, pale and unresponsive after he was drinking mouthwash at bedtime.  He just started fluoxetine yesterday.  When interview, the patient does not remember much of what happened.  He stated that he has not been drinking alcohol prior to this.  He also denies cigarette smoking or recreational drug use. He denied fever, chills, rhinorrhea, sore throat, wheezing or hemoptysis.  No chest pain, palpitations, diaphoresis, PND, orthopnea or pitting edema of the lower extremities.  No abdominal pain, nausea, emesis, diarrhea, constipation, melena or hematochezia.  No flank pain, dysuria, frequency or hematuria.  No polyuria, polydipsia, polyphagia or blurred vision.   ED course: Initial vital signs were temperature 97.5 F, pulse 86, respirations 16, BP 116/83 mmHg O2 sat 96% on room air.  The patient was transiently hypoxic and required nasal cannula oxygen at 4 LPM for several hours.  He received Benadryl 25 mg IVP, normal saline 1000 mL bolus and 2 g of magnesium sulfate IVPB.  Lab work: UDS is negative.  CBC showed a white count of 8.0, hemoglobin 13.6 g/dL platelets 283.  Alcohol level was 308 and salicylate less than 7.0 mg/dL.  Acetaminophen was less than 10 mcg/mL.  Venous blood gas showed a PCO2 of 41  and PO2 of 63 mmHg.  Acid-based excess was 4.8 mmol/L.  The pH, bicarbonate and O2 sat were normal.  CMP showed a CO2 of 20 mmol/L, glucose of 117 and calcium of 8.5 mg/deciliter.  Total protein 6.3 g/dL.  The rest of the electrolytes, renal function and LFTs were normal.  Imaging: CT head without contrast showed no acute intracranial abnormalities.   Review of Systems: As mentioned in the history of present illness. All other systems reviewed and are negative.  Past Medical History:  Diagnosis Date   Anemia    post operative   Antiphospholipid syndrome ([place]) 08/15/2011   Anxiety    Blood transfusion without reported diagnosis    x 2 per pt    Cellulitis    bilateral thighs   Cerumen impaction 07/13/2013   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[place])    Constipation in male    Depression    Diabetes ([place])    no medications    Elevated BP 10/20/2012   Enlarged thyroid 03/07/2015   H/O tobacco use, presenting hazards to health 12/01/2011   Quit October 2014    Hyperlipidemia    Lupus anticoagulant positive 08/15/2011   Medicare annual wellness visit, subsequent 03/19/2016   Obesity    Schizophrenia ([place])    Sleep apnea    wears cpap, no 02   Past Surgical History:  Procedure Laterality Date   COLONOSCOPY     eye surgery     b/l laser eye sx 2004   I & D EXTREMITY     left leg   LEG SURGERY     TONSILLECTOMY     WISDOM  TOOTH EXTRACTION     Social History:  reports that he quit smoking about 14 months ago. His smoking use included cigars and cigarettes. He started smoking about 10 years ago. He has a 1.00 pack-year smoking history. He has quit using smokeless tobacco.  His smokeless tobacco use included chew. He reports that he does not drink alcohol and does not use drugs.  Allergies  Allergen Reactions   Haloperidol Other (See Comments)   Statins Other (See Comments)    ? Related to compartment syndrome   Aripiprazole Other (See Comments)   Haldol [Haloperidol Decanoate]     Painful, HA    Risperidone And Related     Risperdol per pt     Family History  Problem Relation Age of Onset   Diverticulosis Father    Stomach cancer Mother        stomach CA dx at 43   GER disease Mother    Cancer Mother        stomach   Colon cancer Neg Hx    Pancreatic cancer Neg Hx    Esophageal cancer Neg Hx    Colon polyps Neg Hx    Rectal cancer Neg Hx     Prior to Admission medications   Medication Sig Start Date End Date Taking? Authorizing Provider  acetaminophen (TYLENOL) 325 MG tablet 1 tab po every 8 hours as needed for pain Patient taking differently: Take 325 mg by mouth every 8 (eight) hours as needed for mild pain. 03/23/20  Yes Saguier, Percell Miller, PA-C  aspirin (ASPIR-LOW) 81 MG EC tablet TAKE ONE TABLET BY MOUTH DAILY Patient taking differently: Take 81 mg by mouth daily. 01/05/22  Yes Mosie Lukes, MD  clonazePAM (KLONOPIN) 1 MG tablet Take 1 mg by mouth 2 (two) times daily. 06/19/22  Yes [provider]  divalproex (DEPAKOTE ER) 500 MG 24 hr tablet Take 500 mg by mouth See admin instructions. Take 1 tablet in the morning and 2 tablets at bedtime 03/10/21  Yes [provider]  DOK 100 MG capsule TAKE ONE CAPSULE BY MOUTH TWICE A DAY Patient taking differently: Take 100 mg by mouth 2 (two) times daily. 05/01/22  Yes Mosie Lukes, MD  ezetimibe (ZETIA) 10 MG tablet Take 1 tablet (10 mg total) by mouth daily. 06/20/22 12/17/22 Yes Mosie Lukes, MD  FLUoxetine (PROZAC) 20 MG tablet Take 20 mg by mouth daily. 07/12/22  Yes [provider]  ibuprofen (ADVIL) 800 MG tablet TAKE 1 TABLET(800 MG) BY MOUTH EVERY 8 HOURS AS NEEDED Patient taking differently: Take 800 mg by mouth every 8 (eight) hours as needed for headache or fever. 04/12/22  Yes McDonald, Adam R, DPM  lisinopril (ZESTRIL) 2.5 MG tablet TAKE ONE TABLET BY MOUTH DAILY Patient taking differently: Take 2.5 mg by mouth daily. 06/01/22  Yes Mosie Lukes, MD  loratadine (CLARITIN) 10 MG tablet  TAKE 1 TABLET(10 MG) BY MOUTH DAILY Patient taking differently: Take 10 mg by mouth daily. 06/20/22  Yes Mosie Lukes, MD  Multiple Vitamins-Minerals (MULTIVITAMIN MEN) TABS TAKE 1 TABLET BY MOUTH EVERY DAY 09/20/21  Yes Mosie Lukes, MD  OLANZapine (ZYPREXA) 2.5 MG tablet Take 2.5 mg by mouth daily as needed. 03/18/21  Yes [provider]  polyethylene glycol powder (GLYCOLAX/MIRALAX) 17 GM/SCOOP powder TAKE 17 GRAMS BY MOUTH DAILY 07/11/22  Yes Mosie Lukes, MD  rosuvastatin (CRESTOR) 10 MG tablet Take 10 mg by mouth daily. 06/19/22  Yes [provider]  SYNJARDY 12.01-999 MG TABS Take 1 tablet by mouth 2 (two) times daily. 06/08/22  Yes [provider]  Wheat Dextrin (CLEAR SOLUBLE FIBER) POWD TAKE 1 DOSE BY MOUTH EVERY MORNING AND EVERY NIGHT AT BEDTIME 11/24/21  Yes Terrilyn Saver, NP  Lancets (ONETOUCH DELICA PLUS [id number]) [place] 1 each by Other route daily. Glucose 04/13/21   [provider]  [facility] VERIO test strip 1 each by Other route as needed for other (Glucose check). 04/13/21   [provider]    Physical Exam: Vitals:   07/14/22 0400 07/14/22 0425 07/14/22 0430 07/14/22 0543  BP: 121/68  98/66 112/69  Pulse: 89 95 97 81  Resp: '14 19 20 19  '$ Temp:      TempSrc:      SpO2: 95% (!) 79% 91% 100%   Physical Exam Vitals and nursing note reviewed.  Constitutional:      General: He is awake. He is not in acute distress.    Appearance: Normal appearance. He is obese.  HENT:     Head: Normocephalic.     Nose: No rhinorrhea.     Mouth/Throat:     Mouth: Mucous membranes are dry.  Eyes:     General: No scleral icterus.    Pupils: Pupils are equal, round, and reactive to light.  Neck:     Vascular: No JVD.  Cardiovascular:     Rate and Rhythm: Normal rate and regular rhythm.     Heart sounds: S1 normal and S2 normal.  Pulmonary:     Effort: Pulmonary effort is normal.     Breath sounds: Normal breath sounds. No wheezing,  rhonchi or rales.  Abdominal:     General: Bowel sounds are normal. There is no distension.     Palpations: Abdomen is soft.     Tenderness: There is no abdominal tenderness. There is no right CVA tenderness, left CVA tenderness or guarding.  Musculoskeletal:     Cervical back: Neck supple.     Right lower leg: No edema.     Left lower leg: No edema.  Lymphadenopathy:     Cervical: No cervical adenopathy.  Skin:    General: Skin is warm and dry.  Neurological:     General: No focal deficit present.     Mental Status: He is alert and oriented to person, place, and time.  Psychiatric:        Mood and Affect: Mood normal.        Behavior: Behavior is cooperative.   Data Reviewed:  There are no new results to review at this time.  09/05/2021 echocardiogram IMPRESSIONS    1. Left ventricular ejection fraction, by estimation, is 55 to 60%. The  left ventricle has normal function. Left ventricular endocardial border  not optimally defined to evaluate regional wall motion. There is mild  concentric left ventricular  hypertrophy. Left ventricular diastolic parameters are consistent with  Grade I diastolic dysfunction (impaired relaxation).   2. Right ventricular systolic function is normal. The right ventricular  size is normal.   3. The mitral valve is normal in structure. No evidence of mitral valve  regurgitation. No evidence of mitral stenosis.   4. The aortic valve is tricuspid. Aortic valve regurgitation is not  visualized. No aortic stenosis is present.   5. The inferior vena cava is normal in size with greater than 50%  respiratory variability, suggesting right atrial pressure of 3 mmHg.   Assessment and Plan: Principal Problem:   Syncope  Observation/telemetry. Correct hyperammonemia. Monitor and optimize electrolytes Check carotid Doppler. Check echocardiogram.  Active Problems:   Hyperammonemia (HCC) Started lactulose. Follow-up ammonia level in the morning.     OSA (obstructive sleep apnea) Continue CPAP at bedtime.    HTN (hypertension) Continue lisinopril 2.5 mg p.o. daily.    Constipation Continue MiraLAX daily. The patient is also on lactulose now.    Anxiety   Depression   Schizoaffective disorder Continue clonazepam 1 mg p.o. twice daily     Type 2 diabetes mellitus without complications (HCC) Carbohydrate modified diet. Continue metformin 1000 mg p.o. twice daily. Continue Jardiance 25 mg p.o. daily.      Advance Care Planning:   Code Status: Full Code   Consults:   Family Communication: His sister Lelon Frohlich was at bedside.  Severity of Illness: The appropriate patient status for this patient is OBSERVATION. Observation status is judged to be reasonable and necessary in order to provide the required intensity of service to ensure the patient's safety. The patient's presenting symptoms, physical exam findings, and initial radiographic and laboratory data in the context of their medical condition is felt to place them at decreased risk for further clinical deterioration. Furthermore, it is anticipated that the patient will be medically stable for discharge from the hospital within 2 midnights of admission.   Author: Reubin Milan, MD 07/14/2022 9:37 AM  For on call review [email or web address].   This document was prepared using Dragon voice recognition software and may contain some unintended transcription errors.
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
## 2022-07-14 NOTE — ED Triage Notes (Signed)
BIBA from group home for diaphoretic, pale, unresponsive to painful stimuli with bp 90/40, cbg-121. Ems reports when sitting patient up on stretcher woke up and asked where am I at what happened. Last thing patient remembers is drinking some mouth wash at bedtime.   Ems reports 58m NS given with bp improving. Pt started prozac yesterday   MRI on Monday for headache and confusion x1 month. Per ems no results yet.

## 2022-07-15 ENCOUNTER — Observation Stay (HOSPITAL_BASED_OUTPATIENT_CLINIC_OR_DEPARTMENT_OTHER): Payer: Medicare Other

## 2022-07-15 DIAGNOSIS — R55 Syncope and collapse: Secondary | ICD-10-CM

## 2022-07-15 LAB — ECHOCARDIOGRAM COMPLETE
AR max vel: 3.12 cm2
AV Area VTI: 3.2 cm2
AV Area mean vel: 2.96 cm2
AV Mean grad: 4.5 mmHg
AV Peak grad: 9 mmHg
Ao pk vel: 1.5 m/s
Area-P 1/2: 3.72 cm2
Calc EF: 60.7 %
Height: 73 in
S' Lateral: 4.2 cm
Single Plane A2C EF: 51.5 %
Single Plane A4C EF: 65.5 %
Weight: 4091.74 oz

## 2022-07-15 LAB — COMPREHENSIVE METABOLIC PANEL
ALT: 27 U/L (ref 0–44)
AST: 20 U/L (ref 15–41)
Albumin: 4.2 g/dL (ref 3.5–5.0)
Alkaline Phosphatase: 55 U/L (ref 38–126)
Anion gap: 7 (ref 5–15)
BUN: 16 mg/dL (ref 6–20)
CO2: 26 mmol/L (ref 22–32)
Calcium: 9.5 mg/dL (ref 8.9–10.3)
Chloride: 107 mmol/L (ref 98–111)
Creatinine, Ser: 0.84 mg/dL (ref 0.61–1.24)
GFR, Estimated: >60 mL/min (ref >60)
Glucose, Bld: 131 mg/dL — ABNORMAL HIGH (ref 70–99)
Potassium: 4.5 mmol/L (ref 3.5–5.1)
Sodium: 140 mmol/L (ref 135–145)
Total Bilirubin: 0.6 mg/dL (ref 0.3–1.2)
Total Protein: 7.1 g/dL (ref 6.5–8.1)

## 2022-07-15 LAB — AMMONIA: Ammonia: 42 umol/L — ABNORMAL HIGH (ref 9–35)

## 2022-07-15 LAB — VALPROIC ACID LEVEL: Valproic Acid Lvl: 41 ug/mL — ABNORMAL LOW (ref 50.0–100.0)

## 2022-07-15 LAB — PROTIME-INR
INR: 1 (ref 0.8–1.2)
Prothrombin Time: 12.6 seconds (ref 11.4–15.2)

## 2022-07-15 LAB — HEMOGLOBIN A1C
Hgb A1c MFr Bld: 6.6 % — ABNORMAL HIGH (ref 4.8–5.6)
Mean Plasma Glucose: 142.72 mg/dL

## 2022-07-15 LAB — HIV ANTIBODY (ROUTINE TESTING W REFLEX): HIV Screen 4th Generation wRfx: NONREACTIVE

## 2022-07-15 LAB — GLUCOSE, CAPILLARY
Glucose-Capillary: 158 mg/dL — ABNORMAL HIGH (ref 70–99)
Glucose-Capillary: 130 mg/dL — ABNORMAL HIGH (ref 70–99)

## 2022-07-15 MED ORDER — PERFLUTREN LIPID MICROSPHERE
1.0000 mL | INTRAVENOUS | Status: AC | PRN
  Administered 2022-07-15: 2 mL via INTRAVENOUS

## 2022-07-15 MED ORDER — LACTULOSE 10 GM/15ML PO SOLN: 30.0000 g | Freq: Two times a day (BID) | ORAL | 0 refills | Status: DC

## 2022-07-15 NOTE — Evaluation (Signed)
Physical Therapy Evaluation-1x Patient Details Name: Joseph [name] MRN: [id number] DOB: 20-Jul-1976 Today's Date: 07/15/2022  History of Present Illness  46 yo male admitted with syncope, hyperammonemia. Hx of post op anemia, anxiety, schizophrenia, DM, compartment syndrome, obesity, cellulitis  Clinical Impression  On eval, pt was Ind with mobility. He denied dizziness during session. No acute PT needs. 1x eval. Will sign off.        Recommendations for follow up therapy are one component of a multi-disciplinary discharge planning process, led by the attending physician.  Recommendations may be updated based on patient status, additional functional criteria and insurance authorization.  Follow Up Recommendations No PT follow up      Assistance Recommended at Discharge None  Patient can return home with the following       Equipment Recommendations None recommended by PT  Recommendations for Other Services       Functional Status Assessment Patient has not had a recent decline in their functional status     Precautions / Restrictions Precautions Precautions: None Restrictions Weight Bearing Restrictions: No      Mobility  Bed Mobility Overal bed mobility: Independent                  Transfers Overall transfer level: Independent                      Ambulation/Gait Ambulation/Gait assistance: Independent Gait Distance (Feet): 450 Feet Assistive device: None Gait Pattern/deviations: Step-through pattern       General Gait Details: No LOB. Pt denied dizziness. Tolerated distance well. Good gait speed.  Stairs            Wheelchair Mobility    Modified Rankin (Stroke Patients Only)       Balance Overall balance assessment: No apparent balance deficits (not formally assessed)                                           Pertinent Vitals/Pain Pain Assessment Pain Assessment: No/denies pain    Home Living  Family/patient expects to be discharged to:: Group home                        Prior Function Prior Level of Function : Independent/Modified Independent                     Hand Dominance        Extremity/Trunk Assessment   Upper Extremity Assessment Upper Extremity Assessment: Defer to OT evaluation    Lower Extremity Assessment Lower Extremity Assessment: Overall WFL for tasks assessed    Cervical / Trunk Assessment Cervical / Trunk Assessment: Normal  Communication   Communication: HOH  Cognition Arousal/Alertness: Awake/alert Behavior During Therapy: WFL for tasks assessed/performed Overall Cognitive Status: Within Functional Limits for tasks assessed                                          General Comments      Exercises     Assessment/Plan    PT Assessment Patient does not need any further PT services  PT Problem List         PT Treatment Interventions      PT Goals (Current goals  can be found in the Care Plan section)  Acute Rehab PT Goals Patient Stated Goal: per pt's father, plan is for back to group home PT Goal Formulation: All assessment and education complete, DC therapy    Frequency       Co-evaluation               AM-PAC PT "6 Clicks" Mobility  Outcome Measure Help needed turning from your back to your side while in a flat bed without using bedrails?: None Help needed moving from lying on your back to sitting on the side of a flat bed without using bedrails?: None Help needed moving to and from a bed to a chair (including a wheelchair)?: None Help needed standing up from a chair using your arms (e.g., wheelchair or bedside chair)?: None Help needed to walk in hospital room?: None Help needed climbing 3-5 steps with a railing? : None 6 Click Score: 24    End of Session Equipment Utilized During Treatment: Gait belt Activity Tolerance: Patient tolerated treatment well Patient left: in bed;with  call bell/phone within reach;with family/visitor present        Time: 0929-5747 PT Time Calculation (min) (ACUTE ONLY): 12 min   Charges:   PT Evaluation $PT Eval Low Complexity: [place], PT Acute Rehabilitation  Office: [phone] Pager: [phone]

## 2022-07-15 NOTE — Progress Notes (Signed)
Patient being discharged home with father who is legal guardian. PIV and tele removed per order. Discharge teaching completed and all questions answered. Personal belongs sent home with patient.

## 2022-07-15 NOTE — Plan of Care (Signed)

## 2022-07-15 NOTE — Progress Notes (Signed)
  Echocardiogram 2D Echocardiogram has been performed.  Joseph [name] 07/15/2022, 10:20 AM
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
## 2022-07-15 NOTE — Hospital Course (Addendum)
46 y.o.m w/ significant history of postoperative anemia, antiphospholipid syndrome, anxiety, depression, schizophrenia,hx of compartment syndrome,, hypertension, hyperlipidemia, former tobacco use, class II obesity, sleep apnea not using CPAP, grade 1 diastolic dysfunction admitted with working diagnosis of syncope/hyperammonemia.  Was found diaphoretic pale unresponsive after drinking mouthwash.  Chest started fluoxetine 1 day PTA. In ED unable to remember the events.He stated that he has not been drinking alcohol prior to this.  Denies drug use, no chest pain, palpitations, diaphoresis, PND, orthopnea or pitting edema of the lower extremities. abdominal pain, nausea, emesis, diarrhea, constipation, melena or hematochezia. In the ED-afebrile, stable bp, was transiently hypoxic and required nasal cannula oxygen at 4 LPM for several hours.  He received Benadryl 25 mg IVP, normal saline 1000 mL bolus and 2 g of magnesium sulfate IVPB. Labs: UDS is negative.   cbc stable, Alcohol level was 740 and salicylate less than 7.0 mg/dL. Acetaminophen was less than 10 mcg/mL.  Venous blood gas showed a PCO2 of 41 and PO2 of 63 mmHg. Acid-based excess was 4.8 mmol/L.  The pH, bicarbonate and O2 sat were normal.  CMP stable.  Nh3 98. Patient was admitted for observation due to episode of unresponsiveness, suspect toxic encephalopathy.  Work-up unremarkable at this time doing well alert awake oriented,mentation and mood at baseline.Orthostatic vitals negative.Echo is stable.He is being discharged back to group home in medically stable condition after discussing with his sister at bedside.

## 2022-07-17 ENCOUNTER — Telehealth: Payer: Self-pay | Admitting: Family Medicine

## 2022-07-17 ENCOUNTER — Telehealth: Payer: Self-pay | Admitting: Orthopedic Surgery

## 2022-07-17 MED ORDER — LACTULOSE 10 GM/15ML PO SOLN: 30.0000 g | Freq: Two times a day (BID) | ORAL | 0 refills | Status: DC

## 2022-07-17 NOTE — Telephone Encounter (Signed)
Pt's father called asking if pt need to wear brace all day. Please call pt's father Sevan senior at [phone].

## 2022-07-17 NOTE — Telephone Encounter (Signed)
Spoke with Anda Kraft, Program Director at [facility].  Patient (and Father) lost Lactulose, requesting refill. Medication pended.    Transition Care Management Follow-up Telephone Call Date of discharge and from where: 07/15/22, [facility].  How have you been since you were released from the hospital? Joellen Jersey states she has not seen pt, however the House Manager reports the patient "looks good and with it". Patient was discharged in Fathers care.  Any questions or concerns? No  Items Reviewed: Did the pt receive and understand the discharge instructions provided? Yes  Medications obtained and verified? No  Other? No  Any new allergies since your discharge? No  Dietary orders reviewed? No Do you have support at home? Yes   Home Care and Equipment/Supplies: Were home health services ordered? not applicable If so, what is the name of the agency? NA  Has the agency set up a time to come to the patient's home? not applicable Were any new equipment or medical supplies ordered?  No What is the name of the medical supply agency? NA Were you able to get the supplies/equipment? not applicable Do you have any questions related to the use of the equipment or supplies? No  Functional Questionnaire: (I = Independent and D = Dependent) ADLs: I  Bathing/Dressing- I  Meal Prep- I  Eating- I  Maintaining continence- I  Transferring/Ambulation- I  Managing Meds- I  Follow up appointments reviewed:  PCP Hospital f/u appt confirmed? Yes  Scheduled to see PCP on 07/25/22 @ 11:20. [facility] f/u appt confirmed? No   Are transportation arrangements needed? No  If their condition worsens, is the pt aware to call PCP or go to the Emergency Dept.? Yes Was the patient provided with contact information for the PCP's office or ED? Yes Was to pt encouraged to call back with questions or concerns? Yes

## 2022-07-17 NOTE — Telephone Encounter (Signed)
After pt was discharged from the hospital a family came and picked him up, but lost rx given at the hospital. They are hoping pcp is able to fill this.   [facility] [address] [place], [place] [address]

## 2022-07-18 ENCOUNTER — Telehealth: Payer: Self-pay | Admitting: Orthopedic Surgery

## 2022-07-18 NOTE — Telephone Encounter (Signed)
Rx pending MD signature.

## 2022-07-18 NOTE — Telephone Encounter (Signed)
Pt's father returned call about brace for pt. Please call Careers adviser at [phone].

## 2022-07-18 NOTE — Telephone Encounter (Signed)
Spoke with Beauregard at [facility] and will fax to them at [phone]

## 2022-07-18 NOTE — Telephone Encounter (Signed)
Pt's father returned call about brace for pt. Please call Careers adviser at [phone].

## 2022-07-18 NOTE — Telephone Encounter (Signed)
Katlyn called from [facility] group Home asking for a script for compression socks for pt. Please call Katlyn at [phone].

## 2022-07-18 NOTE — Telephone Encounter (Signed)
Called pt's father, not a good connection, he hung up. Will try again later.

## 2022-07-18 NOTE — Telephone Encounter (Signed)
SW pt's father he is informed that pt should wear his braces all day if he can.

## 2022-07-18 NOTE — Telephone Encounter (Signed)
Noted, see previous telephone message.

## 2022-07-24 ENCOUNTER — Other Ambulatory Visit: Payer: Self-pay

## 2022-07-24 MED ORDER — DOCUSATE SODIUM 100 MG PO CAPS: 100.0000 mg | ORAL_CAPSULE | Freq: Two times a day (BID) | ORAL | 0 refills | Status: DC

## 2022-07-25 ENCOUNTER — Ambulatory Visit (INDEPENDENT_AMBULATORY_CARE_PROVIDER_SITE_OTHER): Payer: Medicare Other | Admitting: Family Medicine

## 2022-07-25 ENCOUNTER — Telehealth: Payer: Self-pay | Admitting: *Deleted

## 2022-07-25 VITALS — BP 138/80 | HR 97 | Temp 98.0°F | Resp 16 | Ht 73.0 in | Wt 255.0 lb

## 2022-07-25 DIAGNOSIS — F1010 Alcohol abuse, uncomplicated: Secondary | ICD-10-CM | POA: Diagnosis not present

## 2022-07-25 DIAGNOSIS — E119 Type 2 diabetes mellitus without complications: Secondary | ICD-10-CM | POA: Diagnosis not present

## 2022-07-25 DIAGNOSIS — E7220 Disorder of urea cycle metabolism, unspecified: Secondary | ICD-10-CM

## 2022-07-25 DIAGNOSIS — F17200 Nicotine dependence, unspecified, uncomplicated: Secondary | ICD-10-CM

## 2022-07-25 DIAGNOSIS — R748 Abnormal levels of other serum enzymes: Secondary | ICD-10-CM

## 2022-07-25 DIAGNOSIS — I10 Essential (primary) hypertension: Secondary | ICD-10-CM

## 2022-07-25 DIAGNOSIS — Z789 Other specified health status: Secondary | ICD-10-CM

## 2022-07-25 LAB — LIPID PANEL
Cholesterol: 104 mg/dL (ref 0–200)
HDL: 47.7 mg/dL (ref >39.00)
LDL Cholesterol: 37 mg/dL (ref 0–99)
NonHDL: 56.17
Total CHOL/HDL Ratio: 2
Triglycerides: 97 mg/dL (ref 0.0–149.0)
VLDL: 19.4 mg/dL (ref 0.0–40.0)

## 2022-07-25 LAB — COMPREHENSIVE METABOLIC PANEL WITH GFR
ALT: 25 U/L (ref 0–53)
AST: 20 U/L (ref 0–37)
Albumin: 4.8 g/dL (ref 3.5–5.2)
Alkaline Phosphatase: 45 U/L (ref 39–117)
BUN: 13 mg/dL (ref 6–23)
CO2: 26 meq/L (ref 19–32)
Calcium: 10.1 mg/dL (ref 8.4–10.5)
Chloride: 104 meq/L (ref 96–112)
Creatinine, Ser: 0.89 mg/dL (ref 0.40–1.50)
GFR: 101.36 mL/min
Glucose, Bld: 87 mg/dL (ref 70–99)
Potassium: 5.2 meq/L — ABNORMAL HIGH (ref 3.5–5.1)
Sodium: 138 meq/L (ref 135–145)
Total Bilirubin: 0.4 mg/dL (ref 0.2–1.2)
Total Protein: 7.3 g/dL (ref 6.0–8.3)

## 2022-07-25 LAB — CBC
HCT: 44.8 % (ref 39.0–52.0)
Hemoglobin: 14.9 g/dL (ref 13.0–17.0)
MCHC: 33.2 g/dL (ref 30.0–36.0)
MCV: 100 fl (ref 78.0–100.0)
Platelets: 290 K/uL (ref 150.0–400.0)
RBC: 4.48 Mil/uL (ref 4.22–5.81)
RDW: 13.4 % (ref 11.5–15.5)
WBC: 8.3 K/uL (ref 4.0–10.5)

## 2022-07-25 LAB — TSH: TSH: 1.55 u[IU]/mL (ref 0.35–5.50)

## 2022-07-25 MED ORDER — ASPIRIN 81 MG PO TBEC: 81.0000 mg | DELAYED_RELEASE_TABLET | Freq: Every day | ORAL | 3 refills | Status: DC

## 2022-07-25 MED ORDER — DOCUSATE SODIUM 100 MG PO CAPS: 100.0000 mg | ORAL_CAPSULE | Freq: Two times a day (BID) | ORAL | 0 refills | Status: AC

## 2022-07-25 MED ORDER — LISINOPRIL 2.5 MG PO TABS: ORAL_TABLET | ORAL | 3 refills | Status: DC

## 2022-07-25 MED ORDER — MULTIVITAMIN MEN PO TABS: 1.0000 | ORAL_TABLET | Freq: Every day | ORAL | 3 refills | Status: DC

## 2022-07-25 MED ORDER — LORATADINE 10 MG PO TABS: ORAL_TABLET | ORAL | 3 refills | Status: DC

## 2022-07-25 MED ORDER — POLYETHYLENE GLYCOL 3350 17 GM/SCOOP PO POWD: ORAL | 5 refills | Status: DC

## 2022-07-25 MED ORDER — IBUPROFEN 400 MG PO TABS: 400.0000 mg | ORAL_TABLET | Freq: Three times a day (TID) | ORAL | 5 refills | Status: DC | PRN

## 2022-07-25 NOTE — Assessment & Plan Note (Signed)
Does not tolerate statins.
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
## 2022-07-25 NOTE — Assessment & Plan Note (Signed)
Recently admitted to hospital with altered level of consciousness after drinking mouthwash

## 2022-07-25 NOTE — Assessment & Plan Note (Signed)
He is vaping and smoking and says he is unwilling to quit. He is advised he should definitely not be doing both and to let us know if he is ready to attempt quitting.

## 2022-07-25 NOTE — Assessment & Plan Note (Signed)
Well controlled, no changes to meds. Encouraged heart healthy diet such as the DASH diet and exercise as tolerated.  °

## 2022-07-25 NOTE — Assessment & Plan Note (Signed)
hgba1c acceptable, minimize simple carbs. Increase exercise as tolerated. Continue current meds 

## 2022-07-25 NOTE — Telephone Encounter (Signed)
FYI:  Received call from [facility] lab stating ammonia level cannot be run due to specimen being hemolyzed.  Unsure why specimen was hemolyzed as venipuncture was not complicated or difficult to obtain and specimen was immediately placed on ice for STAT pick up. Notified Jeneen at group home and they will bring pt back for lab visit on Thursday at 1:45 to repeat test. Future order has been placed.

## 2022-07-25 NOTE — Patient Instructions (Signed)
Hepatic Encephalopathy  Hepatic encephalopathy is a change in brain function that includes changes in the ability to think and to use muscles. This condition happens when a person has advanced liver disease. When the liver is damaged, harmful substances (toxins) can build up in the body. Some of these toxins, such as ammonia, can harm the brain. The effects of the condition depend on the type of liver damage and how severe it is. In some cases, hepatic encephalopathy can be reversed. What are the causes? Certain things can trigger or worsen liver function, which can result in hepatic encephalopathy. These things include: Infection. Constipation. Taking certain medicines, such as benzodiazepines. Alcohol use. Bleeding into the intestinal tract. Imbalances in minerals (electrolytes) in the body. Dehydration. Hepatic encephalopathy can sometimes be reversed if these triggers are resolved. What increases the risk? You are at risk of developing this condition if you have advanced liver disease (cirrhosis). Conditions that can cause liver disease include: Infections in the liver, such as hepatitis C. Infections in the blood. Drinking a lot of alcohol over a long period of time. Taking certain medicines, including tranquilizers, diuretics, antidepressants, sleeping pills, or acetaminophen. Genetic diseases, such as Wilson's disease. What are the signs or symptoms? Symptoms may develop suddenly or may develop slowly and get worse gradually. Symptoms can range from mild to severe. Mild symptoms include: Mild confusion. Shortened attention span. Personality and mood changes. Anxiety and agitation. Drowsiness. Symptoms of worsening or severe hepatic encephalopathy include: Extreme confusion (disorientation). Slowed movement. Slurred speech. Extreme personality changes. Abnormal shaking or flapping of the hands (asterixis). Coma. How is this diagnosed? This condition may be diagnosed based  on: A physical exam. Your symptoms and medical history. Blood tests. These may be done to check levels of ammonia in your blood, measure how long it takes your blood to clot, or check for infection. Liver function tests. These may be done to check how well your liver is working. MRI and CT scans. These may be done to check for a brain disorder and to check for problems with your liver. Electroencephalogram (EEG). This test measures the electrical activity in your brain. How is this treated? The first step in treatment is to identify and treat the cause of your liver damage or triggering illness, if possible. The next step is taking medicine to lower the level of toxins in your body and prevent ammonia from building up. Treatment will depend on how severe your encephalopathy is, and may include: Medicine to lower your ammonia level (lactulose). Antibiotic medicine to reduce the amount of ammonia-producing bacteria in your gut. Close monitoring of your blood pressure, heart rate, breathing, and oxygen levels. Removal of fluid from your abdomen. Close monitoring of how you think, feel, and act (mental status). Changes to your diet. Liver transplant, in severe cases. Follow these instructions at home: Medicines Take over-the-counter and prescription medicines only as told by your health care provider. If you were prescribed an antibiotic medicine, take it as told by your health care provider. Do not stop using the antibiotic even if you start to feel better. Do not start taking any new medicines, including over-the-counter medicines, without first checking with your health care provider. Eating and drinking  Work with a dietitian or your health care provider to make sure you are getting the right balance of protein and minerals. Eat small meals throughout the day with a late-night snack of complex carbohydrates. Do not fast. Drink enough fluids to keep your urine pale yellow. Do  not drink  alcohol. General instructions Do not use drugs. Ask your health care provider if it is safe for you to drive. Keep all follow-up visits. This is important. Contact a health care provider if: You develop new symptoms. Your symptoms change or get worse. You have a fever or chills. You have persistent nausea, vomiting, or diarrhea. Get help right away if: You become very confused or drowsy. You vomit blood or material that looks like coffee grounds. Your stool is bloody, black, or looks like tar. Summary Hepatic encephalopathy is a change in brain function that includes changes in the ability to think and to use muscles. This condition happens when a person has advanced liver disease. Certain things can trigger or worsen hepatic encephalopathy. Hepatic encephalopathy can sometimes be reversed if these triggers are resolved. The first step in treatment is to identify and treat the cause of your liver damage or triggering illness, if possible. The next step is taking medicine to lower the level of toxins in your body and prevent ammonia from building up. Your treatment will depend on how severe your hepatic encephalopathy is. This information is not intended to replace advice given to you by your health care provider. Make sure you discuss any questions you have with your health care provider. Document Revised: 06/08/2020 Document Reviewed: 06/08/2020 Elsevier Patient Education  [place].

## 2022-07-25 NOTE — Assessment & Plan Note (Signed)
Will monitor
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
## 2022-07-25 NOTE — Addendum Note (Signed)
Addended by: Sharon Seller B on: 07/25/2022 03:32 PM   Modules accepted: Orders

## 2022-07-26 ENCOUNTER — Other Ambulatory Visit: Payer: Self-pay

## 2022-07-26 DIAGNOSIS — I10 Essential (primary) hypertension: Secondary | ICD-10-CM

## 2022-07-26 LAB — VALPROIC ACID LEVEL: Valproic Acid Lvl: 40.7 mg/L — ABNORMAL LOW (ref 50.0–100.0)

## 2022-07-27 ENCOUNTER — Other Ambulatory Visit: Payer: Medicare Other

## 2022-07-27 ENCOUNTER — Other Ambulatory Visit: Payer: Self-pay

## 2022-07-27 ENCOUNTER — Observation Stay (HOSPITAL_COMMUNITY)
Admission: EM | Admit: 2022-07-27 | Discharge: 2022-07-28 | Disposition: A | Discharge status: Home / Self Care | Payer: Medicare Other | Attending: Internal Medicine | Admitting: Internal Medicine

## 2022-07-27 ENCOUNTER — Emergency Department (HOSPITAL_COMMUNITY): Payer: Medicare Other

## 2022-07-27 ENCOUNTER — Encounter (HOSPITAL_COMMUNITY): Payer: Self-pay | Admitting: Emergency Medicine

## 2022-07-27 DIAGNOSIS — E119 Type 2 diabetes mellitus without complications: Secondary | ICD-10-CM

## 2022-07-27 DIAGNOSIS — G9341 Metabolic encephalopathy: Principal | ICD-10-CM | POA: Diagnosis present

## 2022-07-27 DIAGNOSIS — G9340 Encephalopathy, unspecified: Secondary | ICD-10-CM

## 2022-07-27 DIAGNOSIS — Z7982 Long term (current) use of aspirin: Secondary | ICD-10-CM | POA: Diagnosis not present

## 2022-07-27 DIAGNOSIS — Z87891 Personal history of nicotine dependence: Secondary | ICD-10-CM | POA: Diagnosis not present

## 2022-07-27 DIAGNOSIS — F32A Depression, unspecified: Secondary | ICD-10-CM | POA: Diagnosis present

## 2022-07-27 DIAGNOSIS — K5900 Constipation, unspecified: Secondary | ICD-10-CM | POA: Diagnosis present

## 2022-07-27 DIAGNOSIS — F259 Schizoaffective disorder, unspecified: Secondary | ICD-10-CM | POA: Diagnosis present

## 2022-07-27 DIAGNOSIS — I10 Essential (primary) hypertension: Secondary | ICD-10-CM | POA: Diagnosis present

## 2022-07-27 DIAGNOSIS — Z79899 Other long term (current) drug therapy: Secondary | ICD-10-CM | POA: Diagnosis not present

## 2022-07-27 DIAGNOSIS — R4781 Slurred speech: Secondary | ICD-10-CM | POA: Diagnosis not present

## 2022-07-27 DIAGNOSIS — Z7984 Long term (current) use of oral hypoglycemic drugs: Secondary | ICD-10-CM | POA: Insufficient documentation

## 2022-07-27 DIAGNOSIS — R000 Tachycardia, unspecified: Secondary | ICD-10-CM | POA: Diagnosis not present

## 2022-07-27 DIAGNOSIS — E785 Hyperlipidemia, unspecified: Secondary | ICD-10-CM | POA: Diagnosis present

## 2022-07-27 DIAGNOSIS — G4733 Obstructive sleep apnea (adult) (pediatric): Secondary | ICD-10-CM | POA: Diagnosis present

## 2022-07-27 DIAGNOSIS — R4182 Altered mental status, unspecified: Secondary | ICD-10-CM | POA: Diagnosis not present

## 2022-07-27 LAB — COMPREHENSIVE METABOLIC PANEL
ALT: 31 U/L (ref 0–44)
AST: 30 U/L (ref 15–41)
Albumin: 4.2 g/dL (ref 3.5–5.0)
Alkaline Phosphatase: 43 U/L (ref 38–126)
Anion gap: 9 (ref 5–15)
BUN: 10 mg/dL (ref 6–20)
CO2: 26 mmol/L (ref 22–32)
Calcium: 9.8 mg/dL (ref 8.9–10.3)
Chloride: 107 mmol/L (ref 98–111)
Creatinine, Ser: 0.86 mg/dL (ref 0.61–1.24)
GFR, Estimated: >60 mL/min (ref >60)
Glucose, Bld: 140 mg/dL — ABNORMAL HIGH (ref 70–99)
Potassium: 3.6 mmol/L (ref 3.5–5.1)
Sodium: 142 mmol/L (ref 135–145)
Total Bilirubin: 0.4 mg/dL (ref 0.3–1.2)
Total Protein: 7.4 g/dL (ref 6.5–8.1)

## 2022-07-27 LAB — BLOOD GAS, VENOUS
Acid-Base Excess: 5.5 mmol/L — ABNORMAL HIGH (ref 0.0–2.0)
Bicarbonate: 32.4 mmol/L — ABNORMAL HIGH (ref 20.0–28.0)
O2 Saturation: 44.1 %
Patient temperature: 37
pCO2, Ven: 56 mmHg (ref 44–60)
pH, Ven: 7.37 (ref 7.25–7.43)
pO2, Ven: 31 mmHg — CL (ref 32–45)

## 2022-07-27 LAB — CBC
HCT: 49.3 % (ref 39.0–52.0)
Hemoglobin: 16 g/dL (ref 13.0–17.0)
MCH: 33.1 pg (ref 26.0–34.0)
MCHC: 32.5 g/dL (ref 30.0–36.0)
MCV: 102.1 fL — ABNORMAL HIGH (ref 80.0–100.0)
Platelets: 278 10*3/uL (ref 150–400)
RBC: 4.83 MIL/uL (ref 4.22–5.81)
RDW: 12.9 % (ref 11.5–15.5)
WBC: 6.8 10*3/uL (ref 4.0–10.5)
nRBC: 0 % (ref 0.0–0.2)

## 2022-07-27 LAB — VALPROIC ACID LEVEL: Valproic Acid Lvl: 22 ug/mL — ABNORMAL LOW (ref 50.0–100.0)

## 2022-07-27 LAB — RAPID URINE DRUG SCREEN, HOSP PERFORMED
Amphetamines: NOT DETECTED
Barbiturates: NOT DETECTED
Benzodiazepines: NOT DETECTED
Cocaine: NOT DETECTED
Opiates: NOT DETECTED
Tetrahydrocannabinol: NOT DETECTED

## 2022-07-27 LAB — ACETAMINOPHEN LEVEL: Acetaminophen (Tylenol), Serum: <10 ug/mL — ABNORMAL LOW (ref 10–30)

## 2022-07-27 LAB — SALICYLATE LEVEL: Salicylate Lvl: <7 mg/dL — ABNORMAL LOW (ref 7.0–30.0)

## 2022-07-27 LAB — GLUCOSE, CAPILLARY: Glucose-Capillary: 123 mg/dL — ABNORMAL HIGH (ref 70–99)

## 2022-07-27 LAB — CBG MONITORING, ED: Glucose-Capillary: 124 mg/dL — ABNORMAL HIGH (ref 70–99)

## 2022-07-27 LAB — ETHANOL: Alcohol, Ethyl (B): <10 mg/dL (ref <10)

## 2022-07-27 LAB — AMMONIA: Ammonia: 14 umol/L (ref 9–35)

## 2022-07-27 MED ORDER — POTASSIUM CHLORIDE IN NACL 20-0.9 MEQ/L-% IV SOLN: INTRAVENOUS | Status: DC

## 2022-07-27 MED ORDER — ACETAMINOPHEN 650 MG RE SUPP: 650.0000 mg | Freq: Four times a day (QID) | RECTAL | Status: DC | PRN

## 2022-07-27 MED ORDER — ONDANSETRON HCL 4 MG/2ML IJ SOLN: 4.0000 mg | Freq: Four times a day (QID) | INTRAMUSCULAR | Status: DC | PRN

## 2022-07-27 MED ORDER — MELATONIN 5 MG PO TABS
5.0000 mg | ORAL_TABLET | Freq: Once | ORAL | Status: AC
  Administered 2022-07-27: 5 mg via ORAL
  Filled 2022-07-27: qty 1

## 2022-07-27 MED ORDER — ENOXAPARIN SODIUM 60 MG/0.6ML IJ SOSY
60.0000 mg | PREFILLED_SYRINGE | INTRAMUSCULAR | Status: DC
  Administered 2022-07-27: 60 mg via SUBCUTANEOUS
  Filled 2022-07-27: qty 0.6

## 2022-07-27 MED ORDER — ACETAMINOPHEN 325 MG PO TABS
650.0000 mg | ORAL_TABLET | Freq: Four times a day (QID) | ORAL | Status: DC | PRN
  Administered 2022-07-27 – 2022-07-28 (×2): 650 mg via ORAL
  Filled 2022-07-27 (×2): qty 2

## 2022-07-27 MED ORDER — CHLORHEXIDINE GLUCONATE CLOTH 2 % EX PADS
6.0000 | MEDICATED_PAD | Freq: Every day | CUTANEOUS | Status: DC
  Administered 2022-07-27: 6 via TOPICAL

## 2022-07-27 MED ORDER — ORAL CARE MOUTH RINSE: 15.0000 mL | OROMUCOSAL | Status: DC | PRN

## 2022-07-27 MED ORDER — POTASSIUM CHLORIDE IN NACL 20-0.9 MEQ/L-% IV SOLN
INTRAVENOUS | Status: AC
  Filled 2022-07-27 (×2): qty 1000

## 2022-07-27 MED ORDER — ONDANSETRON HCL 4 MG PO TABS: 4.0000 mg | ORAL_TABLET | Freq: Four times a day (QID) | ORAL | Status: DC | PRN

## 2022-07-27 MED ORDER — LACTATED RINGERS IV BOLUS
1000.0000 mL | Freq: Once | INTRAVENOUS | Status: AC
  Administered 2022-07-27: 1000 mL via INTRAVENOUS

## 2022-07-27 NOTE — ED Provider Notes (Signed)
[name] DEPT Provider Note   CSN: [id number] Arrival date & time: 07/27/22  0935     History History of hernia, diabetes, depression Chief Complaint  Patient presents with   Altered Mental Status    Joseph [name] is a 46 y.o. male.  47 y.o male with a PMH of diabetes, depression, lupus anticoagulant positive presents to the ED via EMS from [facility] for altered mental status.  Patient was recently seen in the emergency department for drinking nonalcoholic mouthwash, on today's visit patient was found going in and out of the bathroom where the nonalcoholic mouthwash was placed.  Patient is ANO x3.  He is not complaining of any pain although there is some redness noted to bilateral eyebrows. Acording to EMS report, patient was diagnosed with liver failure on his last visit and had to receive lactulose.  No further history obtained due to mental status.  The history is provided by the patient and medical records.  Altered Mental Status Presenting symptoms: behavior changes and lethargy   Severity:  Moderate Associated symptoms: weakness (generalized)   Associated symptoms: no abdominal pain, no fever, no nausea and no vomiting        Home Medications Prior to Admission medications   Medication Sig Start Date End Date Taking? Authorizing Provider  acetaminophen (TYLENOL) 325 MG tablet 1 tab po every 8 hours as needed for pain Patient taking differently: Take 325 mg by mouth every 8 (eight) hours as needed for mild pain. 03/23/20   Saguier, Percell Miller, PA-C  aspirin EC (ASPIR-LOW) 81 MG tablet Take 1 tablet (81 mg total) by mouth daily. Swallow whole. 07/25/22   Mosie Lukes, MD  clonazePAM (KLONOPIN) 1 MG tablet Take 1 mg by mouth 2 (two) times daily. 06/19/22   [provider]  divalproex (DEPAKOTE ER) 500 MG 24 hr tablet Take 500 mg by mouth See admin instructions. Take 1 tablet in the morning and 2 tablets at bedtime 03/10/21    [provider]  docusate sodium (COLACE) 100 MG capsule Take 1 capsule (100 mg total) by mouth 2 (two) times daily for 20 days. 07/25/22 08/14/22  Mosie Lukes, MD  ezetimibe (ZETIA) 10 MG tablet Take 1 tablet (10 mg total) by mouth daily. 06/20/22 12/17/22  Mosie Lukes, MD  FLUoxetine (PROZAC) 20 MG tablet Take 20 mg by mouth daily. 07/12/22   [provider]  ibuprofen (ADVIL) 400 MG tablet Take 1 tablet (400 mg total) by mouth every 8 (eight) hours as needed. 07/25/22   Mosie Lukes, MD  lactulose (CHRONULAC) 10 GM/15ML solution Take 45 mLs (30 g total) by mouth 2 (two) times daily. Hold if diarrhea 07/17/22   Mosie Lukes, MD  Lancets [facility] DELICA PLUS [id number]) [place] 1 each by Other route daily. Glucose 04/13/21   [provider]  lisinopril (ZESTRIL) 2.5 MG tablet TAKE ONE TABLET BY MOUTH DAILY 07/25/22   Mosie Lukes, MD  loratadine (CLARITIN) 10 MG tablet TAKE 1 TABLET(10 MG) BY MOUTH DAILY 07/25/22   Mosie Lukes, MD  Multiple Vitamins-Minerals (MULTIVITAMIN MEN) TABS Take 1 tablet by mouth daily. 07/25/22   Mosie Lukes, MD  OLANZapine (ZYPREXA) 2.5 MG tablet Take 2.5 mg by mouth daily as needed. 03/18/21   [provider]  Roma Schanz test strip 1 each by Other route as needed for other (Glucose check). 04/13/21   [provider]  polyethylene glycol powder (GLYCOLAX/MIRALAX) 17 GM/SCOOP powder  TAKE 17 GRAMS BY MOUTH DAILY 07/25/22   Mosie Lukes, MD  rosuvastatin (CRESTOR) 10 MG tablet Take 10 mg by mouth daily. 06/19/22   [provider]  SYNJARDY 12.01-999 MG TABS Take 1 tablet by mouth 2 (two) times daily. 06/08/22   [provider]  Wheat Dextrin (CLEAR SOLUBLE FIBER) POWD TAKE 1 DOSE BY MOUTH EVERY MORNING AND EVERY NIGHT AT BEDTIME 11/24/21   Terrilyn Saver, NP      Allergies    Haloperidol, Statins, Aripiprazole, Haldol [haloperidol decanoate], and Risperidone and related    Review of  Systems   Review of Systems  Constitutional:  Negative for fever.  Respiratory:  Negative for shortness of breath.   Cardiovascular:  Negative for chest pain.  Gastrointestinal:  Negative for abdominal pain, nausea and vomiting.  Genitourinary:  Negative for flank pain.  Musculoskeletal:  Negative for back pain.  Neurological:  Positive for weakness (generalized). Negative for speech difficulty.  All other systems reviewed and are negative.   Physical Exam Updated Vital Signs BP 112/83   Pulse 92   Temp (!) 97.5 F (36.4 C) (Oral)   Resp (!) 22   Ht '6\' 1"'$  (1.854 m)   Wt 115.7 kg   SpO2 99%   BMI 33.65 kg/m  Physical Exam Vitals and nursing note reviewed.  Constitutional:      Comments: Slow to response  HENT:     Head: Normocephalic.      Mouth/Throat:     Mouth: Mucous membranes are dry.  Eyes:     Pupils: Pupils are equal, round, and reactive to light.  Cardiovascular:     Rate and Rhythm: Normal rate.  Pulmonary:     Effort: Pulmonary effort is normal.     Breath sounds: No wheezing.  Abdominal:     General: Abdomen is flat.     Tenderness: There is no abdominal tenderness.     Comments: [place] is soft, nontender to palpation, no guarding.  Musculoskeletal:     Cervical back: Normal range of motion and neck supple.  Skin:    General: Skin is warm and dry.  Neurological:     Mental Status: He is alert and oriented to person, place, and time.     Motor: No weakness.     Comments: Not alert, slow to response but ANO x3.     ED Results / Procedures / Treatments   Labs (all labs ordered are listed, but only abnormal results are displayed) Labs Reviewed  COMPREHENSIVE METABOLIC PANEL - Abnormal; Notable for the following components:      Result Value   Glucose, Bld 140 (*)    All other components within normal limits  CBC - Abnormal; Notable for the following components:   MCV 102.1 (*)    All other components within normal limits  SALICYLATE LEVEL -  Abnormal; Notable for the following components:   Salicylate Lvl <5.6 (*)    All other components within normal limits  BLOOD GAS, VENOUS - Abnormal; Notable for the following components:   pO2, Ven 31 (*)    Bicarbonate 32.4 (*)    Acid-Base Excess 5.5 (*)    All other components within normal limits  VALPROIC ACID LEVEL - Abnormal; Notable for the following components:   Valproic Acid Lvl 22 (*)    All other components within normal limits  CBG MONITORING, ED - Abnormal; Notable for the following components:   Glucose-Capillary 124 (*)    All other  components within normal limits  RAPID URINE DRUG SCREEN, HOSP PERFORMED  ETHANOL  AMMONIA    EKG EKG Interpretation  Date/Time:  Thursday July 27 2022 09:54:08 EDT Ventricular Rate:  104 PR Interval:  139 QRS Duration: 90 QT Interval:  348 QTC Calculation: 458 R Axis:   -7 Text Interpretation: Sinus tachycardia Confirmed by Lajean Saver [phone]) on 07/27/2022 10:04:21 AM  Radiology CT HEAD WO CONTRAST (5MM)  Result Date: 07/27/2022 CLINICAL DATA:  Altered mental status. EXAM: CT HEAD WITHOUT CONTRAST TECHNIQUE: Contiguous axial images were obtained from the base of the skull through the vertex without intravenous contrast. RADIATION DOSE REDUCTION: This exam was performed according to the departmental dose-optimization program which includes automated exposure control, adjustment of the mA and/or kV according to patient size and/or use of iterative reconstruction technique. COMPARISON:  07/14/2022 FINDINGS: Examination degraded by patient motion. Brain: The ventricles are normal in size and configuration. No extra-axial fluid collections are identified. The gray-white differentiation is maintained. No CT findings for acute hemispheric infarction or intracranial hemorrhage. No mass lesions. The brainstem and cerebellum are normal. Vascular: No hyperdense vessels or obvious aneurysm. Skull: No acute skull fracture.  No bone lesion.  Sinuses/Orbits: The paranasal sinuses and mastoid air cells are clear. The globes are intact. Other: No scalp lesions, laceration or hematoma. IMPRESSION: 1. Examination degraded by patient motion. 2. No acute intracranial findings or mass lesions. Electronically Signed   By: Marijo Sanes M.D.   On: 07/27/2022 11:38   DG Chest 1 View  Result Date: 07/27/2022 CLINICAL DATA:  Altered mental status. EXAM: CHEST  1 VIEW COMPARISON:  August 16, 2020. FINDINGS: Mildly enlarged cardiac silhouette is noted. Both lungs are clear. The visualized skeletal structures are unremarkable. IMPRESSION: No active disease. Electronically Signed   By: Marijo Conception M.D.   On: 07/27/2022 10:31    Procedures Procedures    Medications Ordered in ED Medications  lactated ringers bolus 1,000 mL (1,000 mLs Intravenous New Bag/Given 07/27/22 1333)    ED Course/ Medical Decision Making/ A&P                           Medical Decision Making Amount and/or Complexity of Data Reviewed Labs: ordered. Radiology: ordered.  Risk Decision regarding hospitalization.    This patient presents to the ED for concern of AMS, this involves a number of treatment options, and is a complaint that carries with it a high risk of complications and morbidity.  The differential diagnosis includes overdose, trauma versus infection.    Co morbidities: Discussed in HPI   Brief History:  Patient with a prior history of schizophrenia brought in via EMS from [place] facility where he is seeking treatment for his mental health for altered mental status.  Patient's recent ED visit for consuming nonalcoholic mouthwash, he was witnessed going in and out of the bathroom today after his behavior was noted to be different.  He arrives somewhat lethargic, slow to response but alert and oriented x3.  He is not complaining of any pain at this time, he denies any trauma.  EMR reviewed including pt PMHx, past surgical history and past visits  to ER.   See HPI for more details   Lab Tests:  I ordered and independently interpreted labs.  The pertinent results include:    I personally reviewed all laboratory work and imaging. Metabolic panel without any acute abnormality specifically kidney function within normal limits and no significant  electrolyte abnormalities. CBC without leukocytosis or significant anemia. Ammonia level is normal, ethanol is within normal limit.  Salicylate level is less than 7.   Imaging Studies:  NAD. I personally reviewed all imaging studies and no acute abnormality found. I agree with radiology interpretation. CT of his head was obtained as patient began to have decrease in respiratory work of breathing, he was patient on nonrebreather by me.  CT of his head was without any acute findings to account for patient's new oxygen requirement.  Cardiac Monitoring:  The patient was maintained on a cardiac monitor.  I personally viewed and interpreted the cardiac monitored which showed an underlying rhythm of: Sinus tachycardia 104 EKG non-ischemic   Medicines ordered:  I ordered medication including bolus  for hydration Reevaluation of the patient after these medicines showed that the patient stayed the same I have reviewed the patients home medicines and have made adjustments as needed  Reevaluation:  After the interventions noted above I re-evaluated patient and found that they have :stayed the same   Social Determinants of Health:  The patient's social determinants of health were a factor in the care of this patient  Problem List / ED Course:  10:40 AM Spoke to Poison control and given all records, they recommended EKG, 4-hour monitoring.  Patient may have side effects including diarrhea after drinking nonalcoholic mouthwash. Patient here with complaints of altered mental status after drinking nonalcoholic mouthwash at [facility].  Spoke to poison control who recommends better.  Upon review of prior  records there is a visit from July 14, 2022 with the same kind of symptoms, after patient drink an entire bottle of nonalcoholic mouthwash. Vitals are stable on arrival, he did have episodes of gnarly respirations where his O2 saturations went to 84% and was placed on non rebreather, a CT of his head was obtained in order to rule out any intracranial pathology.  Extensive lab review including today's visit without any acute findings to his CBC or CMP.  Decrease in valproic acid.  He was given lactated Ringer's to help with hydration. Other history obtained from caregiver at the bedside who reports patient spent yesterday with his dad, its unclear whether he is taking additional medication from his father.  Patient denies this at this time, UDS is negative, the rest of his labs are normal but does not account for patient's change in mental status.  After discussing case with my attending Dr. Milon Dikes, I feel is best for patient to be monitored and return back to baseline. I do not feel his symptoms are coming from a nonalcoholic mouthwash.   Dispostion:  After consideration of the diagnostic results and the patients response to treatment, I feel that the patent would benefit from admission for further evaluation.  Spoke to Dr. Olevia Bowens who will admit patient for further management.    Portions of this note were generated with [employer]. Dictation errors may occur despite best attempts at proofreading.   Final Clinical Impression(s) / ED Diagnoses Final diagnoses:  Encephalopathy    Rx / DC Orders ED Discharge Orders     None         Janeece Fitting, PA-C 07/27/22 1422    Lajean Saver, MD 07/27/22 1452

## 2022-07-27 NOTE — ED Notes (Addendum)
O2 sats dropping to mid 80's, pt sleeping with mouth open. PA-C at bedside. Pt placed on non-rebreather

## 2022-07-27 NOTE — Progress Notes (Signed)
Patient refuses nocturnal CPAP. He states he has not used a CPAP at home in about 5 years. Equipment pulled from room. RN aware.

## 2022-07-27 NOTE — ED Notes (Signed)
ED TO INPATIENT HANDOFF REPORT  ED Nurse Name and Phone #: Lysle Dingwall Name/Age/Gender Joseph [name] 46 y.o. male Room/Bed: WA05/WA05  Code Status   Code Status: Full Code  Home/SNF/Other Group Home Patient oriented to: self, place, and time Is this baseline? Yes   Triage Complete: Triage complete  Chief Complaint Acute metabolic encephalopathy [W11.91]  Triage Note Pt arriving from [facility] for [place]. Staff from group home reports pt has drank a significant amount of [place] alcohol free mouthwash a couple weeks ago and was found to have high ammonia levels. Unable to confirm if pt did the same today but he is fairly lethargic.    Allergies Allergies  Allergen Reactions   Haloperidol Other (See Comments)   Statins Other (See Comments)    ? Related to compartment syndrome   Aripiprazole Other (See Comments)   Haldol [Haloperidol Decanoate]     Painful, HA   Risperidone And Related     Risperdol per pt     Level of Care/Admitting Diagnosis ED Disposition     ED Disposition  Admit   Condition  --   Comment  Hospital Area: [place] [[id number]]  Level of Care: Stepdown [14]  Admit to SDU based on following criteria: Severe physiological/psychological symptoms:  Any diagnosis requiring assessment & intervention at least every 4 hours on an ongoing basis to obtain desired patient outcomes including stability and rehabilitation  Admit to SDU based on following criteria: Respiratory Distress:  Frequent assessment and/or intervention to maintain adequate ventilation/respiration, pulmonary toilet, and respiratory treatment.  May place patient in observation at [facility] or [place] if equivalent level of care is available:: No  Covid Evaluation: Asymptomatic - no recent exposure (last 10 days) testing not required  Diagnosis: Acute metabolic encephalopathy [[id number]]  Admitting Physician: Reubin Milan [[id number]]  Attending Physician:  Reubin Milan [[id number]]          B Medical/Surgery History Past Medical History:  Diagnosis Date   Anemia    post operative   Antiphospholipid syndrome ([place]) 08/15/2011   Anxiety    Blood transfusion without reported diagnosis    x 2 per pt    Cellulitis    bilateral thighs   Cerumen impaction 07/13/2013   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[place])    Constipation in male    Depression    Diabetes ([place])    no medications    Elevated BP 10/20/2012   Enlarged thyroid 03/07/2015   H/O tobacco use, presenting hazards to health 12/01/2011   Quit October 2014    Hyperlipidemia    Lupus anticoagulant positive 08/15/2011   Medicare annual wellness visit, subsequent 03/19/2016   Obesity    Schizophrenia ([place])    Sleep apnea    wears cpap, no 02   Past Surgical History:  Procedure Laterality Date   COLONOSCOPY     eye surgery     b/l laser eye sx 2004   I & D EXTREMITY     left leg   LEG SURGERY     TONSILLECTOMY     WISDOM TOOTH EXTRACTION       A IV Location/Drains/Wounds Patient Lines/Drains/Airways Status     Active Line/Drains/Airways     Name Placement date Placement time Site Days   Peripheral IV 07/27/22 20 G Anterior;Left;Proximal Forearm 07/27/22  0955  Forearm  less than 1            Intake/Output Last 24 hours  Intake/Output  Summary (Last 24 hours) at 07/27/2022 1619 Last data filed at 07/27/2022 1030 Gross per 24 hour  Intake --  Output 550 ml  Net -550 ml    Labs/Imaging Results for orders placed or performed during the hospital encounter of 07/27/22 (from the past 48 hour(s))  CBG monitoring, ED     Status: Abnormal   Collection Time: 07/27/22  9:52 AM  Result Value Ref Range   Glucose-Capillary 124 (H) 70 - 99 mg/dL    Comment: Glucose reference range applies only to samples taken after fasting for at least 8 hours.  Valproic acid level     Status: Abnormal   Collection Time: 07/27/22  9:52 AM  Result Value Ref Range   Valproic Acid Lvl 22 (L)  50.0 - 100.0 ug/mL    Comment: Performed at [facility], [place] [address] [place], [place] [address]  Comprehensive metabolic panel     Status: Abnormal   Collection Time: 07/27/22  9:57 AM  Result Value Ref Range   Sodium 142 135 - 145 mmol/L   Potassium 3.6 3.5 - 5.1 mmol/L   Chloride 107 98 - 111 mmol/L   CO2 26 22 - 32 mmol/L   Glucose, Bld 140 (H) 70 - 99 mg/dL    Comment: Glucose reference range applies only to samples taken after fasting for at least 8 hours.   BUN 10 6 - 20 mg/dL   Creatinine, Ser 0.86 0.61 - 1.24 mg/dL   Calcium 9.8 8.9 - 10.3 mg/dL   Total Protein 7.4 6.5 - 8.1 g/dL   Albumin 4.2 3.5 - 5.0 g/dL   AST 30 15 - 41 U/L   ALT 31 0 - 44 U/L   Alkaline Phosphatase 43 38 - 126 U/L   Total Bilirubin 0.4 0.3 - 1.2 mg/dL   GFR, Estimated >60 >60 mL/min    Comment: (NOTE) Calculated using the CKD-EPI Creatinine Equation (2021)    Anion gap 9 5 - 15    Comment: Performed at [facility], [place] [address], [place], [place] [address]  CBC     Status: Abnormal   Collection Time: 07/27/22  9:57 AM  Result Value Ref Range   WBC 6.8 4.0 - 10.5 K/uL   RBC 4.83 4.22 - 5.81 MIL/uL   Hemoglobin 16.0 13.0 - 17.0 g/dL   HCT 49.3 39.0 - 52.0 %   MCV 102.1 (H) 80.0 - 100.0 fL   MCH 33.1 26.0 - 34.0 pg   MCHC 32.5 30.0 - 36.0 g/dL   RDW 12.9 11.5 - 15.5 %   Platelets 278 150 - 400 K/uL   nRBC 0.0 0.0 - 0.2 %    Comment: Performed at [facility], [place] [address], [place], [place] [address]  Ethanol     Status: None   Collection Time: 07/27/22  9:57 AM  Result Value Ref Range   Alcohol, Ethyl (B) <10 <10 mg/dL    Comment: (NOTE) Lowest detectable limit for serum alcohol is 10 mg/dL.  For medical purposes only. Performed at [facility], [place] [address], [place], [place] [address]   Salicylate level     Status: Abnormal   Collection Time: 07/27/22  9:57 AM  Result Value Ref Range   Salicylate  Lvl <3.2 (L) 7.0 - 30.0 mg/dL    Comment: Performed at [facility], [place] [address], [place],  [address]  Ammonia     Status: None   Collection Time:  07/27/22  9:57 AM  Result Value Ref Range   Ammonia 14 9 - 35 umol/L    Comment: Performed at [facility], [place] [address], [place], [place] [address]  Blood gas, venous (at [facility] and [facility], not at [facility])     Status: Abnormal   Collection Time: 07/27/22 10:08 AM  Result Value Ref Range   pH, Ven 7.37 7.25 - 7.43   pCO2, Ven 56 44 - 60 mmHg   pO2, Ven 31 (LL) 32 - 45 mmHg    Comment: CRITICAL RESULT CALLED TO, READ BACK BY AND VERIFIED WITH: Mervyn Skeeters, C. RN AT 1045 ON 07/27/2022 BY MECIAL J.    Bicarbonate 32.4 (H) 20.0 - 28.0 mmol/L   Acid-Base Excess 5.5 (H) 0.0 - 2.0 mmol/L   O2 Saturation 44.1 %   Patient temperature 37.0     Comment: Performed at [facility], [place] [address], [place], [place] [address]  Rapid urine drug screen (hospital performed)     Status: None   Collection Time: 07/27/22 10:34 AM  Result Value Ref Range   Opiates NONE DETECTED NONE DETECTED   Cocaine NONE DETECTED NONE DETECTED   Benzodiazepines NONE DETECTED NONE DETECTED   Amphetamines NONE DETECTED NONE DETECTED   Tetrahydrocannabinol NONE DETECTED NONE DETECTED   Barbiturates NONE DETECTED NONE DETECTED    Comment: (NOTE) DRUG SCREEN FOR MEDICAL PURPOSES ONLY.  IF CONFIRMATION IS NEEDED FOR ANY PURPOSE, NOTIFY LAB WITHIN 5 DAYS.  LOWEST DETECTABLE LIMITS FOR URINE DRUG SCREEN Drug Class                     Cutoff (ng/mL) Amphetamine and metabolites    1000 Barbiturate and metabolites    200 Benzodiazepine                 200 Opiates and metabolites        300 Cocaine and metabolites        300 THC                            50 Performed at [facility], [place] [address] [place], [place] [address]   Acetaminophen level     Status: Abnormal   Collection Time: 07/27/22  2:53  PM  Result Value Ref Range   Acetaminophen (Tylenol), Serum <10 (L) 10 - 30 ug/mL    Comment: (NOTE) Therapeutic concentrations vary significantly. A range of 10-30 ug/mL  may be an effective concentration for many patients. However, some  are best treated at concentrations outside of this range. Acetaminophen concentrations >150 ug/mL at 4 hours after ingestion  and >50 ug/mL at 12 hours after ingestion are often associated with  toxic reactions.  Performed at [facility], [place] [address] [place], [place] [address]    CT HEAD WO CONTRAST (5MM)  Result Date: 07/27/2022 CLINICAL DATA:  Altered mental status. EXAM: CT HEAD WITHOUT CONTRAST TECHNIQUE: Contiguous axial images were obtained from the base of the skull through the vertex without intravenous contrast. RADIATION DOSE REDUCTION: This exam was performed according to the departmental dose-optimization program which includes automated exposure control, adjustment of the mA and/or kV according to patient size and/or use of iterative reconstruction technique. COMPARISON:  07/14/2022 FINDINGS: Examination degraded by patient motion. Brain: The ventricles are normal in size and configuration. No extra-axial fluid collections are identified. The gray-white differentiation is maintained. No CT findings for acute  hemispheric infarction or intracranial hemorrhage. No mass lesions. The brainstem and cerebellum are normal. Vascular: No hyperdense vessels or obvious aneurysm. Skull: No acute skull fracture.  No bone lesion. Sinuses/Orbits: The paranasal sinuses and mastoid air cells are clear. The globes are intact. Other: No scalp lesions, laceration or hematoma. IMPRESSION: 1. Examination degraded by patient motion. 2. No acute intracranial findings or mass lesions. Electronically Signed   By: Marijo Sanes M.D.   On: 07/27/2022 11:38   DG Chest 1 View  Result Date: 07/27/2022 CLINICAL DATA:  Altered mental status. EXAM: CHEST  1  VIEW COMPARISON:  August 16, 2020. FINDINGS: Mildly enlarged cardiac silhouette is noted. Both lungs are clear. The visualized skeletal structures are unremarkable. IMPRESSION: No active disease. Electronically Signed   By: Marijo Conception M.D.   On: 07/27/2022 10:31    Pending Labs Unresulted Labs (From admission, onward)     Start     Ordered   08/03/22 0500  Creatinine, serum  (enoxaparin (LOVENOX)    CrCl >/= 30 ml/min)  Weekly,   R     Comments: while on enoxaparin therapy    07/27/22 1534   07/28/22 0500  CBC  Tomorrow morning,   R        07/27/22 1534   07/28/22 0500  Comprehensive metabolic panel  Tomorrow morning,   R        07/27/22 1534            Vitals/Pain Today's Vitals   07/27/22 1530 07/27/22 1545 07/27/22 1600 07/27/22 1615  BP: 107/69 111/78 119/84 105/72  Pulse: 80 80 86 77  Resp: '16 17 14 16  '$ Temp:      TempSrc:      SpO2: 98% 98% 99% 100%  Weight:      Height:        Isolation Precautions No active isolations  Medications Medications  0.9 % NaCl with KCl 20 mEq/ L  infusion (has no administration in time range)  lactated ringers bolus 1,000 mL (has no administration in time range)  enoxaparin (LOVENOX) injection 60 mg (has no administration in time range)  acetaminophen (TYLENOL) tablet 650 mg (has no administration in time range)    Or  acetaminophen (TYLENOL) suppository 650 mg (has no administration in time range)  ondansetron (ZOFRAN) tablet 4 mg (has no administration in time range)    Or  ondansetron (ZOFRAN) injection 4 mg (has no administration in time range)  lactated ringers bolus 1,000 mL (1,000 mLs Intravenous New Bag/Given 07/27/22 1333)    Mobility walks High fall risk   Focused Assessments     R Recommendations: See Admitting Provider Note  Report given to:   Additional Notes:

## 2022-07-27 NOTE — ED Triage Notes (Signed)
Pt arriving from [facility] for Joseph [name]. Staff from group home reports pt has drank a significant amount of [place] alcohol free mouthwash a couple weeks ago and was found to have high ammonia levels. Unable to confirm if pt did the same today but he is fairly lethargic.

## 2022-07-27 NOTE — Progress Notes (Addendum)
Family came in to visit patient and were concerned about the pt potentially missing doses of his home medications (medications non specified by family). This RN worked with pharmacy tech regarding the completion of the PTA medlist and placed copy of group home MAR into the pt's physical chart. Reached out to NP on call regarding families concerned about home medications. Will continue to monitor.

## 2022-07-27 NOTE — H&P (Signed)
History and Physical    Patient: Joseph [name] DOB: [birth year] DOA: 07/27/2022 DOS: the patient was seen and examined on 07/27/2022 PCP: Mosie Lukes, MD  Patient coming from: ALF/ILF  Chief Complaint:  Chief Complaint  Patient presents with   Altered Mental Status   HPI: Joseph [name] is a 46 y.o. male with medical history significant of postoperative anemia, antiphospholipid syndrome, anxiety, depression, schizophrenia, cellulitis of both sites, compartment syndrome, cerumen impaction, hypertension, hyperlipidemia, former tobacco use, class II obesity, sleep apnea on CPAP, grade 1 diastolic dysfunction who was admitted 12 days ago due to AMS with hyperammonemia and alcohol intoxication after drinking mouthwash.  Today he is being brought again to the ED from [facility] due to [place] after drinking a bottle of nonalcoholic mouthwash.  He is sleepy, but arousable.  He only answers simple questions.  He does not elaborate.   ED course: Initial vital signs were temperature 97.4 F, pulse 71, respiration 18, BP 129/92 mmHg and O2 sat 94% on room air.  Poison control was called and recommended 24-hour observation.  Lab work: His CBC showed a white count of 6.8, hemoglobin 16.0 g/dL with an MCV of 102.1 fL and platelets 278.  UDS is negative.  Venous blood gas showed a decrease CO2 at 31 mmHg and increased bicarbonate at 32.4 mmol/L.  Valproic acid was 22 mcg/mL.  CMP with a glucose of 140 mg deciliter, all other values are normal.  Unremarkable acetaminophen, ammonia, alcohol and salicylate levels.  Imaging: Portable 1 view chest radiograph with no active disease.  CT head with no acute intracranial findings or mass lesions.  CT head was degraded by patient motion though.  Review of Systems: As mentioned in the history of present illness. All other systems reviewed and are negative. Past Medical History:  Diagnosis Date   Anemia    post operative   Antiphospholipid  syndrome ([place]) 08/15/2011   Anxiety    Blood transfusion without reported diagnosis    x 2 per pt    Cellulitis    bilateral thighs   Cerumen impaction 07/13/2013   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[place])    Constipation in male    Depression    Diabetes ([place])    no medications    Elevated BP 10/20/2012   Enlarged thyroid 03/07/2015   H/O tobacco use, presenting hazards to health 12/01/2011   Quit October 2014    Hyperlipidemia    Lupus anticoagulant positive 08/15/2011   Medicare annual wellness visit, subsequent 03/19/2016   Obesity    Schizophrenia ([place])    Sleep apnea    wears cpap, no 02   Past Surgical History:  Procedure Laterality Date   COLONOSCOPY     eye surgery     b/l laser eye sx 2004   I & D EXTREMITY     left leg   LEG SURGERY     TONSILLECTOMY     WISDOM TOOTH EXTRACTION     Social History:  reports that he quit smoking about 15 months ago. His smoking use included cigars and cigarettes. He started smoking about 10 years ago. He has a 1.00 pack-year smoking history. He has quit using smokeless tobacco.  His smokeless tobacco use included chew. He reports that he does not drink alcohol and does not use drugs.  Allergies  Allergen Reactions   Haloperidol Other (See Comments)   Statins Other (See Comments)    ? Related to compartment syndrome   Aripiprazole Other (  See Comments)   Haldol [Haloperidol Decanoate]     Painful, HA   Risperidone And Related     Risperdol per pt     Family History  Problem Relation Age of Onset   Diverticulosis Father    Stomach cancer Mother        stomach CA dx at 15   GER disease Mother    Cancer Mother        stomach   Colon cancer Neg Hx    Pancreatic cancer Neg Hx    Esophageal cancer Neg Hx    Colon polyps Neg Hx    Rectal cancer Neg Hx     Prior to Admission medications   Medication Sig Start Date End Date Taking? Authorizing Provider  acetaminophen (TYLENOL) 325 MG tablet 1 tab po every 8 hours as needed for  pain Patient taking differently: Take 325 mg by mouth every 8 (eight) hours as needed for mild pain. 03/23/20   Saguier, Percell Miller, PA-C  aspirin EC (ASPIR-LOW) 81 MG tablet Take 1 tablet (81 mg total) by mouth daily. Swallow whole. 07/25/22   Mosie Lukes, MD  clonazePAM (KLONOPIN) 1 MG tablet Take 1 mg by mouth 2 (two) times daily. 06/19/22   [provider]  divalproex (DEPAKOTE ER) 500 MG 24 hr tablet Take 500 mg by mouth See admin instructions. Take 1 tablet in the morning and 2 tablets at bedtime 03/10/21   [provider]  docusate sodium (COLACE) 100 MG capsule Take 1 capsule (100 mg total) by mouth 2 (two) times daily for 20 days. 07/25/22 08/14/22  Mosie Lukes, MD  ezetimibe (ZETIA) 10 MG tablet Take 1 tablet (10 mg total) by mouth daily. 06/20/22 12/17/22  Mosie Lukes, MD  FLUoxetine (PROZAC) 20 MG tablet Take 20 mg by mouth daily. 07/12/22   [provider]  ibuprofen (ADVIL) 400 MG tablet Take 1 tablet (400 mg total) by mouth every 8 (eight) hours as needed. 07/25/22   Mosie Lukes, MD  lactulose (CHRONULAC) 10 GM/15ML solution Take 45 mLs (30 g total) by mouth 2 (two) times daily. Hold if diarrhea 07/17/22   Mosie Lukes, MD  Lancets [facility] DELICA PLUS [id number]) Tripp 1 each by Other route daily. Glucose 04/13/21   [provider]  lisinopril (ZESTRIL) 2.5 MG tablet TAKE ONE TABLET BY MOUTH DAILY 07/25/22   Mosie Lukes, MD  loratadine (CLARITIN) 10 MG tablet TAKE 1 TABLET(10 MG) BY MOUTH DAILY 07/25/22   Mosie Lukes, MD  Multiple Vitamins-Minerals (MULTIVITAMIN MEN) TABS Take 1 tablet by mouth daily. 07/25/22   Mosie Lukes, MD  OLANZapine (ZYPREXA) 2.5 MG tablet Take 2.5 mg by mouth daily as needed. 03/18/21   [provider]  Roma Schanz test strip 1 each by Other route as needed for other (Glucose check). 04/13/21   [provider]  polyethylene glycol powder (GLYCOLAX/MIRALAX) 17 GM/SCOOP powder TAKE 17  GRAMS BY MOUTH DAILY 07/25/22   Mosie Lukes, MD  rosuvastatin (CRESTOR) 10 MG tablet Take 10 mg by mouth daily. 06/19/22   [provider]  SYNJARDY 12.01-999 MG TABS Take 1 tablet by mouth 2 (two) times daily. 06/08/22   [provider]  Wheat Dextrin (CLEAR SOLUBLE FIBER) POWD TAKE 1 DOSE BY MOUTH EVERY MORNING AND EVERY NIGHT AT BEDTIME 11/24/21   Terrilyn Saver, NP    Physical Exam: Vitals:   07/27/22 1414 07/27/22 1415 07/27/22 1430 07/27/22 1445  BP:  114/77  103/76 103/80  Pulse:  87 86 85  Resp:  '17 17 19  '$ Temp: (!) 97.5 F (36.4 C)     TempSrc: Oral     SpO2:  99% 99% 98%  Weight:      Height:       Physical Exam Vitals and nursing note reviewed.  Constitutional:      General: He is sleeping.     Appearance: Normal appearance. He is obese.     Interventions: Face mask in place.  HENT:     Head: Normocephalic.     Nose: No rhinorrhea.     Mouth/Throat:     Mouth: Mucous membranes are dry.  Eyes:     General: No scleral icterus.    Pupils: Pupils are equal, round, and reactive to light.  Neck:     Vascular: No JVD.  Cardiovascular:     Rate and Rhythm: Normal rate and regular rhythm.     Heart sounds: S1 normal and S2 normal.  Pulmonary:     Effort: Pulmonary effort is normal.     Breath sounds: No wheezing, rhonchi or rales.  Abdominal:     General: Bowel sounds are normal. There is no distension.     Palpations: Abdomen is soft.     Tenderness: There is no abdominal tenderness. There is no guarding.  Musculoskeletal:     Cervical back: Neck supple.     Right lower leg: No edema.     Left lower leg: No edema.  Skin:    General: Skin is warm and dry.  Neurological:     General: No focal deficit present.     Mental Status: He is easily aroused. He is disoriented.  Psychiatric:        Mood and Affect: Mood normal.   Data Reviewed:  There are no new results to review at this time.  Assessment and Plan: Principal Problem:   Acute  metabolic encephalopathy Secondary to mouthwash ingestion. Questionable ingestion of "sleeping pills "from his father. Observation/stepdown. Frequent neurochecks. Continue IV fluids. Follow CBC and chemistry.  Active Problems:   Schizoaffective disorder (HCC)   Depression Hold clonazepam, Depakote, fluoxetine and olanzapine for now. Resume in a.m. once the patient is more alert.    Hyperlipidemia, mild On rosuvastatin.    OSA (obstructive sleep apnea) Continue CPAP at bedtime.    HTN (hypertension) Resume lisinopril in AM. Monitor blood pressure.    Constipation Continue MiraLAX daily. Continue docusate twice daily. Dulcolax as needed.    Type 2 diabetes mellitus without complications (HCC) Carbohydrate modified diet.   CBG every 6 hours while altered. CBG before meals and bedtime once eating a more alert.    Advance Care Planning:   Code Status: Full Code   Consults:   Family Communication:   Severity of Illness: The appropriate patient status for this patient is OBSERVATION. Observation status is judged to be reasonable and necessary in order to provide the required intensity of service to ensure the patient's safety. The patient's presenting symptoms, physical exam findings, and initial radiographic and laboratory data in the context of their medical condition is felt to place them at decreased risk for further clinical deterioration. Furthermore, it is anticipated that the patient will be medically stable for discharge from the hospital within 2 midnights of admission.   Author: Reubin Milan, MD 07/27/2022 3:34 PM  For on call review [email or web address].   This document was prepared using Systems analyst and may contain some  unintended transcription errors.

## 2022-07-28 DIAGNOSIS — G9341 Metabolic encephalopathy: Secondary | ICD-10-CM | POA: Diagnosis not present

## 2022-07-28 LAB — COMPREHENSIVE METABOLIC PANEL
ALT: 35 U/L (ref 0–44)
AST: 41 U/L (ref 15–41)
Albumin: 3.5 g/dL (ref 3.5–5.0)
Alkaline Phosphatase: 44 U/L (ref 38–126)
Anion gap: 8 (ref 5–15)
BUN: 15 mg/dL (ref 6–20)
CO2: 25 mmol/L (ref 22–32)
Calcium: 8.6 mg/dL — ABNORMAL LOW (ref 8.9–10.3)
Chloride: 106 mmol/L (ref 98–111)
Creatinine, Ser: 0.94 mg/dL (ref 0.61–1.24)
GFR, Estimated: >60 mL/min (ref >60)
Glucose, Bld: 171 mg/dL — ABNORMAL HIGH (ref 70–99)
Potassium: 4.2 mmol/L (ref 3.5–5.1)
Sodium: 139 mmol/L (ref 135–145)
Total Bilirubin: 0.4 mg/dL (ref 0.3–1.2)
Total Protein: 6 g/dL — ABNORMAL LOW (ref 6.5–8.1)

## 2022-07-28 LAB — CBC
HCT: 41.2 % (ref 39.0–52.0)
Hemoglobin: 13.4 g/dL (ref 13.0–17.0)
MCH: 33.2 pg (ref 26.0–34.0)
MCHC: 32.5 g/dL (ref 30.0–36.0)
MCV: 102 fL — ABNORMAL HIGH (ref 80.0–100.0)
Platelets: 252 10*3/uL (ref 150–400)
RBC: 4.04 MIL/uL — ABNORMAL LOW (ref 4.22–5.81)
RDW: 12.7 % (ref 11.5–15.5)
WBC: 7 10*3/uL (ref 4.0–10.5)
nRBC: 0 % (ref 0.0–0.2)

## 2022-07-28 MED ORDER — TRAMADOL HCL 50 MG PO TABS
50.0000 mg | ORAL_TABLET | Freq: Once | ORAL | Status: AC
  Administered 2022-07-28: 50 mg via ORAL
  Filled 2022-07-28: qty 1

## 2022-07-28 NOTE — Discharge Summary (Signed)
Physician Discharge Summary  Joseph [name] JKD:[id number] DOB: 1975-11-24 DOA: 07/27/2022  PCP: Joseph Lukes, MD  Admit date: 07/27/2022 Discharge date: 07/28/2022  Admitted From: Group Home Disposition:  Group Home  Discharge Condition:Stable CODE STATUS:FULL Diet recommendation: Heart Healthy   Brief/Interim Summary:  HPI: Joseph [name] is a 46 y.o. male with medical history significant of postoperative anemia, antiphospholipid syndrome, anxiety, depression, schizophrenia, cellulitis of both sites, compartment syndrome, cerumen impaction, hypertension, hyperlipidemia, former tobacco use, class II obesity, sleep apnea on CPAP, grade 1 diastolic dysfunction who was admitted 12 days ago due to AMS with hyperammonemia and alcohol intoxication after drinking mouthwash.  Today he is being brought again to the ED from [facility] due to [place] after drinking a bottle of nonalcoholic mouthwash.  He is sleepy, but arousable.  He only answers simple questions.  He does not elaborate.   ED course: Initial vital signs were temperature 97.4 F, pulse 71, respiration 18, BP 129/92 mmHg and O2 sat 94% on room air.  Poison control was called and recommended 24-hour observation.    Hospital course:  Patient remained hemodynamically stable.  This morning looks comfortable, vitals are stable, alert and oriented.  Denies any complaints.  He says he wants to walk on the hallway.  He is very interested to go back to his group home. Patient thoroughly examined at the bedside without any abnormal findings.  I did discuss with psychiatry about his presentation, psychiatry says he does not need to be consulted.  Psychiatry, follow-up with his psychiatrist as an outpatient. Patient is medically stable for discharge back to group home today.  We will continue his home medications.  Patient has been advised not to ingest mouthwash in the future  Discharge Diagnoses:  Principal Problem:   Acute metabolic  encephalopathy Active Problems:   Schizoaffective disorder (HCC)   Hyperlipidemia, mild   OSA (obstructive sleep apnea)   HTN (hypertension)   Constipation   Depression   Type 2 diabetes mellitus without complications [facility])    Discharge Instructions  Discharge Instructions     Diet general   Complete by: As directed    Discharge instructions   Complete by: As directed    1)Please follow with your PCP and psychiatry as an outpatient   Increase activity slowly   Complete by: As directed       Allergies as of 07/28/2022       Reactions   Haloperidol Other (See Comments)   Statins Other (See Comments)   ? Related to compartment syndrome   Aripiprazole Other (See Comments)   Haldol [haloperidol Decanoate]    Painful, HA   Risperidone And Related    Risperdol per pt         Medication List     STOP taking these medications    ibuprofen 400 MG tablet Commonly known as: ADVIL   lactulose 10 GM/15ML solution Commonly known as: CHRONULAC       TAKE these medications    acetaminophen 325 MG tablet Commonly known as: Tylenol 1 tab po every 8 hours as needed for pain What changed:  how much to take how to take this when to take this reasons to take this additional instructions   aspirin EC 81 MG tablet Commonly known as: Aspir-Low Take 1 tablet (81 mg total) by mouth daily. Swallow whole.   Clear Soluble Fiber Powd TAKE 1 DOSE BY MOUTH EVERY MORNING AND EVERY NIGHT AT BEDTIME What changed:  how much  to take how to take this when to take this additional instructions   clonazePAM 1 MG tablet Commonly known as: KLONOPIN Take 1 mg by mouth 2 (two) times daily.   divalproex 500 MG 24 hr tablet Commonly known as: DEPAKOTE ER Take 1,000 mg by mouth at bedtime.   docusate sodium 100 MG capsule Commonly known as: Colace Take 1 capsule (100 mg total) by mouth 2 (two) times daily for 20 days.   ezetimibe 10 MG tablet Commonly known as: ZETIA Take 1  tablet (10 mg total) by mouth daily.   FLUoxetine 20 MG tablet Commonly known as: PROZAC Take 20 mg by mouth at bedtime.   lisinopril 2.5 MG tablet Commonly known as: ZESTRIL TAKE ONE TABLET BY MOUTH DAILY What changed:  how much to take how to take this when to take this additional instructions   loratadine 10 MG tablet Commonly known as: CLARITIN TAKE 1 TABLET(10 MG) BY MOUTH DAILY What changed:  how much to take how to take this when to take this   Multivitamin Men Tabs Take 1 tablet by mouth daily.   OLANZapine 2.5 MG tablet Commonly known as: ZYPREXA Take 2.5 mg by mouth daily as needed.   OneTouch Delica Plus [id number] Misc 1 each by Other route daily. Glucose   OneTouch Verio test strip Generic drug: glucose blood 1 each by Other route as needed for other (Glucose check).   polyethylene glycol powder 17 GM/SCOOP powder Commonly known as: GLYCOLAX/MIRALAX TAKE 17 GRAMS BY MOUTH DAILY What changed:  how much to take how to take this when to take this additional instructions   rosuvastatin 10 MG tablet Commonly known as: CRESTOR Take 10 mg by mouth daily.   Synjardy 12.01-999 MG Tabs Generic drug: Empagliflozin-metFORMIN HCl Take 1 tablet by mouth 2 (two) times daily.        Follow-up Information     Joseph Lukes, MD. Schedule an appointment as soon as possible for a visit in 1 week(s).   Specialty: Family Medicine Contact information: [address] [place] [address] [phone]                Allergies  Allergen Reactions   Haloperidol Other (See Comments)   Statins Other (See Comments)    ? Related to compartment syndrome   Aripiprazole Other (See Comments)   Haldol [Haloperidol Decanoate]     Painful, HA   Risperidone And Related     Risperdol per pt     Consultations: None   Procedures/Studies: CT HEAD WO CONTRAST (5MM)  Result Date: 07/27/2022 CLINICAL DATA:  Altered mental status. EXAM:  CT HEAD WITHOUT CONTRAST TECHNIQUE: Contiguous axial images were obtained from the base of the skull through the vertex without intravenous contrast. RADIATION DOSE REDUCTION: This exam was performed according to the departmental dose-optimization program which includes automated exposure control, adjustment of the mA and/or kV according to patient size and/or use of iterative reconstruction technique. COMPARISON:  07/14/2022 FINDINGS: Examination degraded by patient motion. Brain: The ventricles are normal in size and configuration. No extra-axial fluid collections are identified. The gray-white differentiation is maintained. No CT findings for acute hemispheric infarction or intracranial hemorrhage. No mass lesions. The brainstem and cerebellum are normal. Vascular: No hyperdense vessels or obvious aneurysm. Skull: No acute skull fracture.  No bone lesion. Sinuses/Orbits: The paranasal sinuses and mastoid air cells are clear. The globes are intact. Other: No scalp lesions, laceration or hematoma. IMPRESSION: 1. Examination degraded  by patient motion. 2. No acute intracranial findings or mass lesions. Electronically Signed   By: Marijo Sanes M.D.   On: 07/27/2022 11:38   DG Chest 1 View  Result Date: 07/27/2022 CLINICAL DATA:  Altered mental status. EXAM: CHEST  1 VIEW COMPARISON:  August 16, 2020. FINDINGS: Mildly enlarged cardiac silhouette is noted. Both lungs are clear. The visualized skeletal structures are unremarkable. IMPRESSION: No active disease. Electronically Signed   By: Marijo Conception M.D.   On: 07/27/2022 10:31   ECHOCARDIOGRAM COMPLETE  Result Date: 07/15/2022    ECHOCARDIOGRAM REPORT   Patient Name:   ZEDDIE NJIE [name] Date of Exam: 07/15/2022 Medical Rec #:  [id number]          Height:       73.0 in Accession #:    [id number]         Weight:       255.7 lb Date of Birth:  08-02-1976           BSA:          2.389 m Patient Age:    4 years           BP:           112/52 mmHg Patient  Gender: M                  HR:           85 bpm. Exam Location:  Inpatient Procedure: 2D Echo and Intracardiac Opacification Agent Indications:    Syncope  History:        Patient has prior history of Echocardiogram examinations, most                 recent 09/05/2021. Signs/Symptoms:Syncope; Risk Factors:Diabetes                 and Hypertension.  Sonographer:    Harvie Junior Referring Phys: [id number] Solomon  Sonographer Comments: Technically difficult study due to poor echo windows and patient is obese. Image acquisition challenging due to patient body habitus. IMPRESSIONS  1. Left ventricular ejection fraction, by estimation, is 60 to 65%. The left ventricle has normal function. The left ventricle has no regional wall motion abnormalities. There is mild left ventricular hypertrophy. Left ventricular diastolic parameters were normal.  2. Right ventricular systolic function is normal. The right ventricular size is mildly enlarged. There is normal pulmonary artery systolic pressure. The estimated right ventricular systolic pressure is 57.0 mmHg.  3. The mitral valve is normal in structure. No evidence of mitral valve regurgitation. No evidence of mitral stenosis.  4. The aortic valve was not well visualized. Aortic valve regurgitation is not visualized. No aortic stenosis is present.  5. The inferior vena cava is dilated in size with >50% respiratory variability, suggesting right atrial pressure of 8 mmHg. FINDINGS  Left Ventricle: Left ventricular ejection fraction, by estimation, is 60 to 65%. The left ventricle has normal function. The left ventricle has no regional wall motion abnormalities. Definity contrast agent was given IV to delineate the left ventricular  endocardial borders. The left ventricular internal cavity size was normal in size. There is mild left ventricular hypertrophy. Left ventricular diastolic parameters were normal. Right Ventricle: The right ventricular size is mildly enlarged. No  increase in right ventricular wall thickness. Right ventricular systolic function is normal. There is normal pulmonary artery systolic pressure. The tricuspid regurgitant velocity is 1.93  m/s, and with an assumed right atrial  pressure of 8 mmHg, the estimated right ventricular systolic pressure is 73.7 mmHg. Left Atrium: Left atrial size was normal in size. Right Atrium: Right atrial size was normal in size. Pericardium: There is no evidence of pericardial effusion. Mitral Valve: The mitral valve is normal in structure. No evidence of mitral valve regurgitation. No evidence of mitral valve stenosis. Tricuspid Valve: The tricuspid valve is normal in structure. Tricuspid valve regurgitation is not demonstrated. Aortic Valve: The aortic valve was not well visualized. Aortic valve regurgitation is not visualized. No aortic stenosis is present. Aortic valve mean gradient measures 4.5 mmHg. Aortic valve peak gradient measures 9.0 mmHg. Aortic valve area, by VTI measures 3.20 cm. Pulmonic Valve: The pulmonic valve was not well visualized. Pulmonic valve regurgitation is not visualized. Aorta: The aortic root and ascending aorta are structurally normal, with no evidence of dilitation. Venous: The inferior vena cava is dilated in size with greater than 50% respiratory variability, suggesting right atrial pressure of 8 mmHg. IAS/Shunts: The interatrial septum was not well visualized.  LEFT VENTRICLE PLAX 2D LVIDd:         5.40 cm      Diastology LVIDs:         4.20 cm      LV e' medial:    9.57 cm/s LV PW:         1.10 cm      LV E/e' medial:  8.6 LV IVS:        1.10 cm      LV e' lateral:   10.90 cm/s LVOT diam:     2.10 cm      LV E/e' lateral: 7.6 LV SV:         81 LV SV Index:   34 LVOT Area:     3.46 cm  LV Volumes (MOD) LV vol d, MOD A2C: 61.2 ml LV vol d, MOD A4C: 141.0 ml LV vol s, MOD A2C: 29.7 ml LV vol s, MOD A4C: 48.6 ml LV SV MOD A2C:     31.5 ml LV SV MOD A4C:     141.0 ml LV SV MOD BP:      59.2 ml RIGHT  VENTRICLE RV Basal diam:  4.40 cm RV Mid diam:    3.40 cm RV S prime:     18.10 cm/s TAPSE (M-mode): 2.2 cm LEFT ATRIUM             Index        RIGHT ATRIUM           Index LA diam:        3.10 cm 1.30 cm/m   RA Area:     17.60 cm LA Vol (A2C):   56.9 ml 23.82 ml/m  RA Volume:   50.00 ml  20.93 ml/m LA Vol (A4C):   51.4 ml 21.52 ml/m LA Biplane Vol: 57.8 ml 24.20 ml/m  AORTIC VALVE                    PULMONIC VALVE AV Area (Vmax):    3.12 cm     PV Vmax:       1.25 m/s AV Area (Vmean):   2.96 cm     PV Peak grad:  6.2 mmHg AV Area (VTI):     3.20 cm AV Vmax:           150.00 cm/s AV Vmean:          99.350 cm/s AV VTI:  0.254 m AV Peak Grad:      9.0 mmHg AV Mean Grad:      4.5 mmHg LVOT Vmax:         135.00 cm/s LVOT Vmean:        84.800 cm/s LVOT VTI:          0.235 m LVOT/AV VTI ratio: 0.93  AORTA Ao Root diam: 3.30 cm Ao Asc diam:  3.30 cm MITRAL VALVE               TRICUSPID VALVE MV Area (PHT): 3.72 cm    TR Peak grad:   14.9 mmHg MV Decel Time: 204 msec    TR Vmax:        193.00 cm/s MV E velocity: 82.30 cm/s MV A velocity: 66.00 cm/s  SHUNTS MV E/A ratio:  1.25        Systemic VTI:  0.24 m                            Systemic Diam: 2.10 cm Oswaldo Milian MD Electronically signed by Oswaldo Milian MD Signature Date/Time: 07/15/2022/2:24:25 PM    Final    VAS US CAROTID  Result Date: 07/14/2022 Carotid Arterial Duplex Study Patient Name:  AHAMED HOFLAND [name]  Date of Exam:   07/14/2022 Medical Rec #: [id number]           Accession #:    [id number] Date of Birth: Aug 06, 1976            Patient Gender: M Patient Age:   44 years Exam Location:  [facility] Procedure:      VAS US CAROTID Referring Phys: Shanon Brow ORTIZ --------------------------------------------------------------------------------  Indications:      Syncope. Risk Factors:     Hypertension, hyperlipidemia, Diabetes, past history of                   smoking. Comparison Study: No previous exams Performing  Technologist: Jody Hill RVT, RDMS  Examination Guidelines: A complete evaluation includes B-mode imaging, spectral Doppler, color Doppler, and power Doppler as needed of all accessible portions of each vessel. Bilateral testing is considered an integral part of a complete examination. Limited examinations for reoccurring indications may be performed as noted.  Right Carotid Findings: +----------+--------+--------+--------+------------------+------------------+           PSV cm/sEDV cm/sStenosisPlaque DescriptionComments           +----------+--------+--------+--------+------------------+------------------+ CCA Prox  99      18                                                   +----------+--------+--------+--------+------------------+------------------+ CCA Distal97      25                                                   +----------+--------+--------+--------+------------------+------------------+ ICA Prox  55      15                                                   +----------+--------+--------+--------+------------------+------------------+  ICA Distal56      26                                intimal thickening +----------+--------+--------+--------+------------------+------------------+ ECA       105     23                                                   +----------+--------+--------+--------+------------------+------------------+ +----------+--------+-------+----------------+-------------------+           PSV cm/sEDV cmsDescribe        Arm Pressure (mmHG) +----------+--------+-------+----------------+-------------------+ Subclavian129     11     Multiphasic, WNL                    +----------+--------+-------+----------------+-------------------+ +---------+--------+--+--------+-+---------+ VertebralPSV cm/s33EDV cm/s9Antegrade +---------+--------+--+--------+-+---------+  Left Carotid Findings:  +----------+--------+--------+--------+------------------+--------+           PSV cm/sEDV cm/sStenosisPlaque DescriptionComments +----------+--------+--------+--------+------------------+--------+ CCA Prox  130     20                                         +----------+--------+--------+--------+------------------+--------+ CCA Distal94      25                                         +----------+--------+--------+--------+------------------+--------+ ICA Prox  44      10                                         +----------+--------+--------+--------+------------------+--------+ ICA Distal43      18                                         +----------+--------+--------+--------+------------------+--------+ ECA       78      21                                         +----------+--------+--------+--------+------------------+--------+ +----------+--------+--------+----------------+-------------------+           PSV cm/sEDV cm/sDescribe        Arm Pressure (mmHG) +----------+--------+--------+----------------+-------------------+ Subclavian102             Multiphasic, WNL                    +----------+--------+--------+----------------+-------------------+ +---------+--------+--+--------+--+---------+ VertebralPSV cm/s47EDV cm/s12Antegrade +---------+--------+--+--------+--+---------+   Summary: Right Carotid: The extracranial vessels were near-normal with only minimal wall                thickening or plaque. Left Carotid: The extracranial vessels were near-normal with only minimal wall               thickening or plaque. Vertebrals:  Bilateral vertebral arteries demonstrate antegrade flow. Subclavians: Normal flow hemodynamics were seen in bilateral subclavian              arteries. *See  table(s) above for measurements and observations.  Electronically signed by Servando Snare MD on 07/14/2022 at 5:25:46 PM.    Final    CT Head Wo Contrast  Result Date:  07/14/2022 CLINICAL DATA:  46 year old male with history of delirium. EXAM: CT HEAD WITHOUT CONTRAST TECHNIQUE: Contiguous axial images were obtained from the base of the skull through the vertex without intravenous contrast. RADIATION DOSE REDUCTION: This exam was performed according to the departmental dose-optimization program which includes automated exposure control, adjustment of the mA and/or kV according to patient size and/or use of iterative reconstruction technique. COMPARISON:  Brain MRI 07/10/2022. FINDINGS: Comment: Study is mildly limited by patient motion. Brain: No evidence of acute infarction, hemorrhage, hydrocephalus, extra-axial collection or mass lesion/mass effect. Vascular: No hyperdense vessel or unexpected calcification. Skull: Normal. Negative for fracture or focal lesion. Sinuses/Orbits: No acute finding. Other: None. IMPRESSION: 1. No acute intracranial abnormalities. Within the limitations of today's motion limited examination, the brain appears normal. Electronically Signed   By: Vinnie Langton M.D.   On: 07/14/2022 05:24   MR BRAIN W WO CONTRAST  Result Date: 07/11/2022 Table formatting from the original result was not included. [facility] NEUROIM[facility] REPORT STUDY DATE: 07/10/22 PATIENT NAME: TIGRAN HAYNIE [name] DOB: 1976-05-29 MRN: [id number] ORDERING CLINICIAN: Penni Bombard, MD CLINICAL HISTORY: 46 y.o. year old male with: 1. Spell of abnormal behavior  2. Memory loss   EXAM: MR BRAIN W WO CONTRAST TECHNIQUE: MRI of the brain with and without contrast was obtained utilizing multiplanar, multiecho pulse sequences. CONTRAST: Diagnostic Product Medications (last 72 hours)   Date/Time Action Medication Dose  07/10/22 1213 Contrast Given  gadopiclenol (VUEWAY) 0.5 MMOL/ML solution 10 mL 10 mL   COMPARISON: none IMAGING SITE: [facility] [place] [id number] FINDINGS: No abnormal  lesions are seen on diffusion-weighted views to suggest acute ischemia. The cortical sulci, fissures and cisterns are normal in size and appearance. Lateral, third and fourth ventricle are normal in size and appearance. No extra-axial fluid collections are seen. No evidence of mass effect or midline shift.  Few punctate foci of nonspecific gliosis.  No abnormal lesions are seen on post contrast views.  On sagittal views the posterior fossa, pituitary gland and corpus callosum are unremarkable. No evidence of intracranial hemorrhage on SWI views. The orbits and their contents, paranasal sinuses and calvarium are unremarkable.  Intracranial flow voids are present.   Unremarkable MRI brain with and without contrast.  No acute findings. INTERPRETING PHYSICIAN: Penni Bombard, MD Certified in Neurology, Neurophysiology and Neuroimaging [facility] [address], [place], [place] [address] [phone]     Subjective: Patient seen and examined at bedside today.  Hemodynamically stable for discharge  Discharge Exam: Vitals:   07/28/22 0800 07/28/22 0900  BP: (!) 148/93   Pulse: 88 92  Resp: (!) 25 (!) 21  Temp: 97.8 F (36.6 C)   SpO2: 97% 100%   Vitals:   07/28/22 0600 07/28/22 0700 07/28/22 0800 07/28/22 0900  BP: 127/74 (!) 137/90 (!) 148/93   Pulse: 71 76 88 92  Resp: (!) 24 14 (!) 25 (!) 21  Temp:   97.8 F (36.6 C)   TempSrc:   Oral   SpO2: 100% 96% 97% 100%  Weight:      Height:        General: Pt is alert, awake, not in acute distress Cardiovascular: RRR, S1/S2 +, no  rubs, no gallops Respiratory: CTA bilaterally, no wheezing, no rhonchi Abdominal: Soft, NT, ND, bowel sounds + Extremities: no edema, no cyanosis    The results of significant diagnostics from this hospitalization (including imaging, microbiology, ancillary and laboratory) are listed below for reference.     Microbiology: No results found for this or any previous visit (from the  past 240 hour(s)).   Labs: BNP (last 3 results) No results for input(s): "BNP" in the last 8760 hours. Basic Metabolic Panel: Recent Labs  Lab 07/25/22 1216 07/27/22 0957 07/28/22 0319  NA 138 142 139  K 5.2* 3.6 4.2  CL 104 107 106  CO2 '26 26 25  '$ GLUCOSE 87 140* 171*  BUN '13 10 15  '$ CREATININE 0.89 0.86 0.94  CALCIUM 10.1 9.8 8.6*   Liver Function Tests: Recent Labs  Lab 07/25/22 1216 07/27/22 0957 07/28/22 0319  AST 20 30 41  ALT 25 31 35  ALKPHOS 45 43 44  BILITOT 0.4 0.4 0.4  PROT 7.3 7.4 6.0*  ALBUMIN 4.8 4.2 3.5   No results for input(s): "LIPASE", "AMYLASE" in the last 168 hours. Recent Labs  Lab 07/27/22 0957  AMMONIA 14   CBC: Recent Labs  Lab 07/25/22 1216 07/27/22 0957 07/28/22 0319  WBC 8.3 6.8 7.0  HGB 14.9 16.0 13.4  HCT 44.8 49.3 41.2  MCV 100.0 102.1* 102.0*  PLT 290.0 278 252   Cardiac Enzymes: No results for input(s): "CKTOTAL", "CKMB", "CKMBINDEX", "TROPONINI" in the last 168 hours. BNP: Invalid input(s): "POCBNP" CBG: Recent Labs  Lab 07/27/22 0952 07/27/22 1715  GLUCAP 124* 123*   D-Dimer No results for input(s): "DDIMER" in the last 72 hours. Hgb A1c No results for input(s): "HGBA1C" in the last 72 hours. Lipid Profile Recent Labs    07/25/22 1216  CHOL 104  HDL 47.70  LDLCALC 37  TRIG 97.0  CHOLHDL 2   Thyroid function studies Recent Labs    07/25/22 1216  TSH 1.55   Anemia work up No results for input(s): "VITAMINB12", "FOLATE", "FERRITIN", "TIBC", "IRON", "RETICCTPCT" in the last 72 hours. Urinalysis    Component Value Date/Time   COLORURINE YELLOW 08/05/2019 0951   APPEARANCEUR CLEAR 08/05/2019 0951   LABSPEC 1.015 08/05/2019 0951   PHURINE 8.0 08/05/2019 0951   GLUCOSEU NEGATIVE 08/05/2019 0951   HGBUR NEGATIVE 08/05/2019 0951   BILIRUBINUR NEGATIVE 08/05/2019 0951   KETONESUR NEGATIVE 08/05/2019 0951   PROTEINUR NEG 02/03/2014 0900   UROBILINOGEN 0.2 08/05/2019 0951   NITRITE NEGATIVE  08/05/2019 0951   LEUKOCYTESUR NEGATIVE 08/05/2019 0951   Sepsis Labs Recent Labs  Lab 07/25/22 1216 07/27/22 0957 07/28/22 0319  WBC 8.3 6.8 7.0   Microbiology No results found for this or any previous visit (from the past 240 hour(s)).  Please note: You were cared for by a hospitalist during your hospital stay. Once you are discharged, your primary care physician will handle any further medical issues. Please note that NO REFILLS for any discharge medications will be authorized once you are discharged, as it is imperative that you return to your primary care physician (or establish a relationship with a primary care physician if you do not have one) for your post hospital discharge needs so that they can reassess your need for medications and monitor your lab values.    Time coordinating discharge: 40 minutes  SIGNED:   Shelly Coss, MD  [facility] 07/28/2022, 10:40 AM Pager [phone]  If 7PM-7AM, please contact night-coverage [email or web address] Password [email or web address]

## 2022-07-28 NOTE — TOC Transition Note (Signed)
Transition of Care [facility]) - CM/SW Discharge Note   Patient Details  Name: KERNEY HOPFENSPERGER MRN: [id number] Date of Birth: 02/06/1976  Transition of Care [facility]) CM/SW Contact:  Dessa Phi, RN Phone Number: 07/28/2022, 10:52 AM   Clinical Narrative: Referral for otpt SA resources-added to d/c instructions. TC Group Home rep [facility]-they will pick patient up once ready to transport back-they were informed to limit access to alcohol-they state they do not have alcohol at Group Home-this is likely from family per [place]. Nsg to check if patient has clothes & contact [facility] when ready for pick up tel#[phone]. No further CM needs.    Final next level of care: Group Home Barriers to Discharge: No Barriers Identified   Patient Goals and CMS Choice Patient states their goals for this hospitalization and ongoing recovery are::  (Group Home) CMS Medicare.gov Compare Post Acute Care list provided to:: Patient Represenative (must comment) (group Home rep [place]) Choice offered to / list presented to : NA  Discharge Placement                       Discharge Plan and Services   Discharge Planning Services: CM Consult Post Acute Care Choice: Resumption of Svcs/PTA Provider                               Social Determinants of Health (SDOH) Interventions     Readmission Risk Interventions     No data to display

## 2022-07-28 NOTE — Progress Notes (Signed)
This nurse spoke with the group home rep [place] [facility] about transportation. The patients family will transport the patient at time of discharge. Both parties are aware of discharge plans. Patient will return to group home later this afternoon. Father and sister are at bedside.

## 2022-07-28 NOTE — Telephone Encounter (Signed)
Opened in error

## 2022-08-02 DIAGNOSIS — F200 Paranoid schizophrenia: Secondary | ICD-10-CM | POA: Diagnosis not present

## 2022-08-04 ENCOUNTER — Other Ambulatory Visit: Payer: Self-pay

## 2022-08-04 ENCOUNTER — Telehealth: Payer: Self-pay | Admitting: Family Medicine

## 2022-08-04 MED ORDER — ACETAMINOPHEN 325 MG PO TABS: ORAL_TABLET | ORAL | 0 refills | Status: DC

## 2022-08-04 NOTE — Telephone Encounter (Signed)
Medication was sent

## 2022-08-04 NOTE — Telephone Encounter (Signed)
Medication: acetaminophen (TYLENOL) 325 MG tablet  Has the patient contacted their pharmacy? No.   Preferred Pharmacy:   [facility] [address], [place], [place] [address]

## 2022-08-08 ENCOUNTER — Other Ambulatory Visit (INDEPENDENT_AMBULATORY_CARE_PROVIDER_SITE_OTHER): Payer: Medicare Other

## 2022-08-08 DIAGNOSIS — I10 Essential (primary) hypertension: Secondary | ICD-10-CM

## 2022-08-08 DIAGNOSIS — F1010 Alcohol abuse, uncomplicated: Secondary | ICD-10-CM

## 2022-08-08 LAB — COMPREHENSIVE METABOLIC PANEL
ALT: 20 U/L (ref 0–53)
AST: 15 U/L (ref 0–37)
Albumin: 4.1 g/dL (ref 3.5–5.2)
Alkaline Phosphatase: 56 U/L (ref 39–117)
BUN: 16 mg/dL (ref 6–23)
CO2: 25 mEq/L (ref 19–32)
Calcium: 9 mg/dL (ref 8.4–10.5)
Chloride: 101 mEq/L (ref 96–112)
Creatinine, Ser: 1.03 mg/dL (ref 0.40–1.50)
GFR: 85.89 mL/min (ref >60.00)
Glucose, Bld: 247 mg/dL — ABNORMAL HIGH (ref 70–99)
Potassium: 4.6 mEq/L (ref 3.5–5.1)
Sodium: 136 mEq/L (ref 135–145)
Total Bilirubin: 0.1 mg/dL — ABNORMAL LOW (ref 0.2–1.2)
Total Protein: 6.3 g/dL (ref 6.0–8.3)

## 2022-08-08 LAB — AMMONIA: Ammonia: 48 umol/L — ABNORMAL HIGH (ref 11–35)

## 2022-08-09 ENCOUNTER — Other Ambulatory Visit: Payer: Self-pay

## 2022-08-09 MED ORDER — LACTULOSE 20 GM/30ML PO SOLN: ORAL | 0 refills | Status: DC

## 2022-08-09 NOTE — Addendum Note (Signed)
Addended by: Laure Kidney on: 08/09/2022 09:22 AM   Modules accepted: Orders

## 2022-08-11 ENCOUNTER — Telehealth: Payer: Self-pay | Admitting: Family Medicine

## 2022-08-11 NOTE — Telephone Encounter (Signed)
Patient's housing called stating they were unable to get the Lactulose medication for the patient due to the medication not having a dosage on it. They stated it needs to be sent back to the pharmacy with the dosage on it. Please advise.

## 2022-08-14 ENCOUNTER — Other Ambulatory Visit: Payer: Self-pay

## 2022-08-14 MED ORDER — LACTULOSE 20 GM/30ML PO SOLN: ORAL | 0 refills | Status: DC

## 2022-08-14 NOTE — Telephone Encounter (Signed)
Medication sent.

## 2022-08-16 ENCOUNTER — Other Ambulatory Visit: Payer: Medicare Other

## 2022-08-22 ENCOUNTER — Other Ambulatory Visit: Payer: Self-pay | Admitting: Family Medicine

## 2022-08-23 ENCOUNTER — Encounter: Payer: Self-pay | Admitting: *Deleted

## 2022-08-23 ENCOUNTER — Other Ambulatory Visit (INDEPENDENT_AMBULATORY_CARE_PROVIDER_SITE_OTHER): Payer: Medicare Other

## 2022-08-23 DIAGNOSIS — E7220 Disorder of urea cycle metabolism, unspecified: Secondary | ICD-10-CM

## 2022-08-23 LAB — AMMONIA: Ammonia: 29 umol/L (ref 11–35)

## 2022-08-24 ENCOUNTER — Other Ambulatory Visit: Payer: Self-pay | Admitting: Family Medicine

## 2022-08-28 ENCOUNTER — Telehealth: Payer: Self-pay | Admitting: *Deleted

## 2022-08-28 ENCOUNTER — Other Ambulatory Visit: Payer: Self-pay

## 2022-08-28 NOTE — Telephone Encounter (Addendum)
Kaylyn from home called and wanted to know if we can send in a prescription for docusate sodium to sent into [facility].  Need sig and qty.  They also need a note from Dr. Charlett Blake stating that patient can take OTC medications.  Note can be faxed to home at [phone].  Note completed and just need signature.  Lastly they wanted to know if patient still needed to take the lactulose.  Advised it was a 7 day supply that sent on 08/14/22 and should be completed by now.

## 2022-08-28 NOTE — Telephone Encounter (Signed)
Called pt  group home was advised and faxed letter

## 2022-08-31 ENCOUNTER — Other Ambulatory Visit: Payer: Self-pay

## 2022-08-31 ENCOUNTER — Telehealth: Payer: Self-pay | Admitting: Family Medicine

## 2022-08-31 NOTE — Telephone Encounter (Signed)
Prescription Request  08/31/2022  Is this a "Controlled Substance" medicine? No  LOV: 07/25/2022  What is the name of the medication or equipment?  Wheat Dextrin (CLEAR SOLUBLE FIBER) POWD   Have you contacted your pharmacy to request a refill? No   Which pharmacy would you like this sent to?  [id number] - [place], [address] [place] [address] Phone: [phone] Fax: [phone]    Patient notified that their request is being sent to the clinical staff for review and that they should receive a response within 2 business days.   Please advise at [place]

## 2022-09-01 ENCOUNTER — Other Ambulatory Visit: Payer: Self-pay

## 2022-09-01 ENCOUNTER — Other Ambulatory Visit: Payer: Self-pay | Admitting: Family Medicine

## 2022-09-01 DIAGNOSIS — K5900 Constipation, unspecified: Secondary | ICD-10-CM

## 2022-09-01 MED ORDER — CLEAR SOLUBLE FIBER PO POWD: 1.0000 | Freq: Two times a day (BID) | ORAL | 11 refills | Status: DC

## 2022-09-01 NOTE — Telephone Encounter (Signed)
Called them and sent to pharmacy

## 2022-09-04 ENCOUNTER — Other Ambulatory Visit: Payer: Self-pay

## 2022-09-04 ENCOUNTER — Telehealth: Payer: Self-pay | Admitting: Family Medicine

## 2022-09-04 NOTE — Telephone Encounter (Signed)
Rep from [facility] stated pt is wanting a stool softener capsule.   [facility] [address], [place], [place] [address]

## 2022-09-05 ENCOUNTER — Other Ambulatory Visit: Payer: Self-pay

## 2022-09-05 MED ORDER — DOCUSATE SODIUM 100 MG PO CAPS: 100.0000 mg | ORAL_CAPSULE | Freq: Two times a day (BID) | ORAL | 0 refills | Status: DC

## 2022-09-05 NOTE — Telephone Encounter (Signed)
Medication sent.

## 2022-09-06 ENCOUNTER — Telehealth: Payer: Self-pay | Admitting: Family Medicine

## 2022-09-06 ENCOUNTER — Other Ambulatory Visit: Payer: Self-pay | Admitting: Family Medicine

## 2022-09-06 DIAGNOSIS — G4733 Obstructive sleep apnea (adult) (pediatric): Secondary | ICD-10-CM

## 2022-09-06 NOTE — Telephone Encounter (Signed)
Pt's caregiver was wanting to know where to complete his sleep study. Did not see any order, please advise.

## 2022-09-07 ENCOUNTER — Other Ambulatory Visit: Payer: Self-pay

## 2022-09-07 ENCOUNTER — Telehealth: Payer: Self-pay | Admitting: Adult Health

## 2022-09-07 ENCOUNTER — Other Ambulatory Visit: Payer: Self-pay | Admitting: Family Medicine

## 2022-09-07 ENCOUNTER — Emergency Department (HOSPITAL_COMMUNITY)
Admission: EM | Admit: 2022-09-07 | Discharge: 2022-09-07 | Disposition: A | Discharge status: Other Acute Inpatient Hospital | Payer: Medicare Other | Attending: Emergency Medicine | Admitting: Emergency Medicine

## 2022-09-07 ENCOUNTER — Emergency Department (HOSPITAL_COMMUNITY): Payer: Medicare Other

## 2022-09-07 DIAGNOSIS — R404 Transient alteration of awareness: Secondary | ICD-10-CM | POA: Diagnosis not present

## 2022-09-07 DIAGNOSIS — Z87891 Personal history of nicotine dependence: Secondary | ICD-10-CM | POA: Insufficient documentation

## 2022-09-07 DIAGNOSIS — R61 Generalized hyperhidrosis: Secondary | ICD-10-CM | POA: Insufficient documentation

## 2022-09-07 DIAGNOSIS — I10 Essential (primary) hypertension: Secondary | ICD-10-CM | POA: Insufficient documentation

## 2022-09-07 DIAGNOSIS — E1165 Type 2 diabetes mellitus with hyperglycemia: Secondary | ICD-10-CM | POA: Insufficient documentation

## 2022-09-07 DIAGNOSIS — G4733 Obstructive sleep apnea (adult) (pediatric): Secondary | ICD-10-CM

## 2022-09-07 DIAGNOSIS — Z79899 Other long term (current) drug therapy: Secondary | ICD-10-CM | POA: Insufficient documentation

## 2022-09-07 DIAGNOSIS — E860 Dehydration: Secondary | ICD-10-CM | POA: Diagnosis not present

## 2022-09-07 DIAGNOSIS — K5900 Constipation, unspecified: Secondary | ICD-10-CM

## 2022-09-07 DIAGNOSIS — R42 Dizziness and giddiness: Secondary | ICD-10-CM | POA: Diagnosis not present

## 2022-09-07 DIAGNOSIS — Z7982 Long term (current) use of aspirin: Secondary | ICD-10-CM | POA: Insufficient documentation

## 2022-09-07 DIAGNOSIS — R410 Disorientation, unspecified: Secondary | ICD-10-CM | POA: Diagnosis not present

## 2022-09-07 LAB — CBC WITH DIFFERENTIAL/PLATELET
Abs Immature Granulocytes: 0.04 10*3/uL (ref 0.00–0.07)
Basophils Absolute: 0.1 10*3/uL (ref 0.0–0.1)
Basophils Relative: 1 %
Eosinophils Absolute: 0.2 10*3/uL (ref 0.0–0.5)
Eosinophils Relative: 3 %
HCT: 45.2 % (ref 39.0–52.0)
Hemoglobin: 15.2 g/dL (ref 13.0–17.0)
Immature Granulocytes: 1 %
Lymphocytes Relative: 30 %
Lymphs Abs: 2.5 10*3/uL (ref 0.7–4.0)
MCH: 32.9 pg (ref 26.0–34.0)
MCHC: 33.6 g/dL (ref 30.0–36.0)
MCV: 97.8 fL (ref 80.0–100.0)
Monocytes Absolute: 0.8 10*3/uL (ref 0.1–1.0)
Monocytes Relative: 10 %
Neutro Abs: 4.6 10*3/uL (ref 1.7–7.7)
Neutrophils Relative %: 55 %
Platelets: 265 10*3/uL (ref 150–400)
RBC: 4.62 MIL/uL (ref 4.22–5.81)
RDW: 12.1 % (ref 11.5–15.5)
WBC: 8.2 10*3/uL (ref 4.0–10.5)
nRBC: 0 % (ref 0.0–0.2)

## 2022-09-07 LAB — TROPONIN I (HIGH SENSITIVITY)
Troponin I (High Sensitivity): 3 ng/L (ref <18)
Troponin I (High Sensitivity): 3 ng/L (ref <18)

## 2022-09-07 LAB — BLOOD GAS, VENOUS
Acid-Base Excess: 3.4 mmol/L — ABNORMAL HIGH (ref 0.0–2.0)
Bicarbonate: 28.5 mmol/L — ABNORMAL HIGH (ref 20.0–28.0)
O2 Saturation: 91.7 %
Patient temperature: 37
pCO2, Ven: 44 mmHg (ref 44–60)
pH, Ven: 7.42 (ref 7.25–7.43)
pO2, Ven: 63 mmHg — ABNORMAL HIGH (ref 32–45)

## 2022-09-07 LAB — COMPREHENSIVE METABOLIC PANEL
ALT: 23 U/L (ref 0–44)
AST: 22 U/L (ref 15–41)
Albumin: 4.2 g/dL (ref 3.5–5.0)
Alkaline Phosphatase: 59 U/L (ref 38–126)
Anion gap: 8 (ref 5–15)
BUN: 14 mg/dL (ref 6–20)
CO2: 25 mmol/L (ref 22–32)
Calcium: 9.3 mg/dL (ref 8.9–10.3)
Chloride: 106 mmol/L (ref 98–111)
Creatinine, Ser: 0.81 mg/dL (ref 0.61–1.24)
GFR, Estimated: >60 mL/min (ref >60)
Glucose, Bld: 140 mg/dL — ABNORMAL HIGH (ref 70–99)
Potassium: 4.7 mmol/L (ref 3.5–5.1)
Sodium: 139 mmol/L (ref 135–145)
Total Bilirubin: 0.5 mg/dL (ref 0.3–1.2)
Total Protein: 7.8 g/dL (ref 6.5–8.1)

## 2022-09-07 LAB — LACTIC ACID, PLASMA
Lactic Acid, Venous: 1.5 mmol/L (ref 0.5–1.9)
Lactic Acid, Venous: 1.3 mmol/L (ref 0.5–1.9)

## 2022-09-07 LAB — RAPID URINE DRUG SCREEN, HOSP PERFORMED
Amphetamines: NOT DETECTED
Barbiturates: NOT DETECTED
Benzodiazepines: NOT DETECTED
Cocaine: NOT DETECTED
Opiates: NOT DETECTED
Tetrahydrocannabinol: NOT DETECTED

## 2022-09-07 LAB — URINALYSIS, ROUTINE W REFLEX MICROSCOPIC
Bacteria, UA: NONE SEEN
Bilirubin Urine: NEGATIVE
Glucose, UA: >=500 mg/dL — AB
Hgb urine dipstick: NEGATIVE
Ketones, ur: NEGATIVE mg/dL
Leukocytes,Ua: NEGATIVE
Nitrite: NEGATIVE
Protein, ur: NEGATIVE mg/dL
Specific Gravity, Urine: 1.003 — ABNORMAL LOW (ref 1.005–1.030)
pH: 7 (ref 5.0–8.0)

## 2022-09-07 LAB — ACETAMINOPHEN LEVEL: Acetaminophen (Tylenol), Serum: <10 ug/mL — ABNORMAL LOW (ref 10–30)

## 2022-09-07 LAB — TSH: TSH: 2.663 u[IU]/mL (ref 0.350–4.500)

## 2022-09-07 LAB — MAGNESIUM: Magnesium: 2.6 mg/dL — ABNORMAL HIGH (ref 1.7–2.4)

## 2022-09-07 LAB — SALICYLATE LEVEL: Salicylate Lvl: <7 mg/dL — ABNORMAL LOW (ref 7.0–30.0)

## 2022-09-07 MED ORDER — SODIUM CHLORIDE 0.9 % IV BOLUS
1000.0000 mL | Freq: Once | INTRAVENOUS | Status: AC
  Administered 2022-09-07: 1000 mL via INTRAVENOUS

## 2022-09-07 NOTE — ED Provider Notes (Addendum)
[name] DEPT Provider Note   CSN: [id number] Arrival date & time: 09/07/22  1404     History  Chief Complaint  Patient presents with   Dizziness    Joseph [name] is a 46 y.o. male.  Patient as above with significant medical history as below, including DM< htn, HLD, schizophrenia, antiphospholipid syndrome, depression who presents to the ED with complaint of dizziness. Pt from group home, group home staff at bedside Reports around 11am-12pm today he began to have a strange sensation that he is having trouble describing, reports feeling like he "cannot control his body"  Reports feeling abnormal but unable to elaborate what he specifically mean sby this Also having some dizziness/room spinning that has been constant. Not a/w nausea or vomiting, no headache or speech changes No numbness/weakness/ or tingling No falls or head injuries Feels like his vision is slightly "less sharp" than normal, no diplopia Denies similar sensation in the past Denies SI or HI, has taken extra doses of olanzapine last few days to help w/ his anxiety  No ingestion with intent for harm       Past Medical History:  Diagnosis Date   Anemia    post operative   Antiphospholipid syndrome ([place]) 08/15/2011   Anxiety    Blood transfusion without reported diagnosis    x 2 per pt    Cellulitis    bilateral thighs   Cerumen impaction 07/13/2013   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[place])    Constipation in male    Depression    Diabetes ([place])    no medications    Elevated BP 10/20/2012   Enlarged thyroid 03/07/2015   H/O tobacco use, presenting hazards to health 12/01/2011   Quit October 2014    Hyperlipidemia    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 No language interpreter was used.  Dizziness Associated symptoms: no chest pain, no headaches, no nausea, no palpitations, no shortness of breath and no vomiting        Home Medications Prior to Admission medications   Medication Sig Start Date End Date Taking? Authorizing Provider  docusate sodium (COLACE) 100 MG capsule Take 1 capsule (100 mg total) by mouth 2 (two) times daily. 09/05/22   Mosie Lukes, MD  acetaminophen (TYLENOL) 325 MG tablet 1 tab po every 8 hours as needed for pain 08/04/22   Mosie Lukes, MD  aspirin EC (ASPIR-LOW) 81 MG tablet Take 1 tablet (81 mg total) by mouth daily. Swallow whole. 07/25/22   Mosie Lukes, MD  clonazePAM (KLONOPIN) 1 MG tablet Take 1 mg by mouth 2 (two) times daily. 06/19/22   [provider]  divalproex (DEPAKOTE ER) 500 MG 24 hr tablet Take 1,000 mg by mouth at bedtime. 03/10/21   [provider]  ezetimibe (ZETIA) 10 MG tablet Take 1 tablet (10 mg total) by mouth daily. 06/20/22 12/17/22  Mosie Lukes, MD  FLUoxetine [facility])  20 MG tablet Take 20 mg by mouth at bedtime. 07/12/22   [provider]  Lancets (ONETOUCH DELICA PLUS [id number]) [place] 1 each by Other route daily. Glucose 04/13/21   [provider]  lisinopril (ZESTRIL) 2.5 MG tablet TAKE ONE TABLET BY MOUTH DAILY Patient taking differently: Take 2.5 mg by mouth daily. 07/25/22   Mosie Lukes, MD  loratadine (CLARITIN) 10 MG tablet TAKE 1 TABLET(10 MG) BY MOUTH DAILY Patient taking differently: Take 10 mg by mouth daily. TAKE 1 TABLET(10 MG) BY MOUTH DAILY 07/25/22   Mosie Lukes, MD  Multiple Vitamins-Minerals (MULTIVITAMIN MEN) TABS Take 1 tablet by mouth daily. 07/25/22   Mosie Lukes, MD  OLANZapine (ZYPREXA) 2.5 MG tablet Take 2.5 mg by mouth daily as needed. 03/18/21    [provider]  Roma Schanz test strip 1 each by Other route as needed for other (Glucose check). 04/13/21   [provider]  polyethylene glycol powder (GLYCOLAX/MIRALAX) 17 GM/SCOOP powder TAKE 17 GRAMS BY MOUTH DAILY Patient taking differently: Take 17 g by mouth daily. 07/25/22   Mosie Lukes, MD  rosuvastatin (CRESTOR) 10 MG tablet Take 10 mg by mouth daily. 06/19/22   [provider]  SYNJARDY 12.01-999 MG TABS Take 1 tablet by mouth 2 (two) times daily. 06/08/22   [provider]  Wheat Dextrin (CLEAR SOLUBLE FIBER) POWD Take 1 Dose by mouth in the morning and at bedtime. 09/01/22   Mosie Lukes, MD      Allergies    Haloperidol, Statins, Aripiprazole, Haldol [haloperidol decanoate], and Risperidone and related    Review of Systems   Review of Systems  Constitutional:  Positive for diaphoresis and fatigue. Negative for chills and fever.  HENT:  Negative for facial swelling and trouble swallowing.   Eyes:  Positive for visual disturbance. Negative for photophobia and pain.  Respiratory:  Negative for cough and shortness of breath.   Cardiovascular:  Negative for chest pain and palpitations.  Gastrointestinal:  Negative for abdominal pain, nausea and vomiting.  Endocrine: Negative for polydipsia and polyuria.  Genitourinary:  Negative for difficulty urinating and hematuria.  Musculoskeletal:  Negative for gait problem and joint swelling.  Skin:  Negative for pallor and rash.  Neurological:  Positive for dizziness. Negative for syncope and headaches.  Psychiatric/Behavioral:  Negative for agitation and confusion. The patient is nervous/anxious.     Physical Exam Updated Vital Signs BP (!) 121/110   Pulse 82   Temp 97.8 F (36.6 C)   Resp (!) 22   SpO2 95%  Physical Exam Vitals and nursing note reviewed.  Constitutional:      General: He is not in acute distress.    Appearance: Normal appearance. He is well-developed. He is obese.  He is diaphoretic.  HENT:     Head: Normocephalic and atraumatic.     Right Ear: External ear normal.     Left Ear: External ear normal.     Mouth/Throat:     Mouth: Mucous membranes are moist.  Eyes:     General: No visual field deficit or scleral icterus.    Extraocular Movements: Extraocular movements intact.     Pupils: Pupils are equal, round, and reactive to light.  Cardiovascular:     Rate and Rhythm: Normal rate and regular rhythm.     Pulses: Normal pulses.     Heart sounds: Normal heart sounds.  Pulmonary:     Effort: Pulmonary effort is normal. No respiratory distress.  Breath sounds: Normal breath sounds.  Abdominal:     General: Abdomen is flat.     Palpations: Abdomen is soft.     Tenderness: There is no abdominal tenderness.  Musculoskeletal:        General: Normal range of motion.     Cervical back: Full passive range of motion without pain and normal range of motion.     Right lower leg: No edema.     Left lower leg: No edema.  Skin:    General: Skin is warm.     Capillary Refill: Capillary refill takes less than 2 seconds.  Neurological:     Mental Status: He is alert and oriented to person, place, and time.     GCS: GCS eye subscore is 4. GCS verbal subscore is 5. GCS motor subscore is 6.     Cranial Nerves: Cranial nerves 2-12 are intact. No dysarthria or facial asymmetry.     Sensory: Sensation is intact.     Motor: Motor function is intact. No tremor.     Coordination: Coordination is intact. Coordination normal.     Comments: No clonus Strength 5/5 BLUE BLLE Gait not tested 2/2 pt safety  No visual field cut Sleepy but arousable     Psychiatric:        Attention and Perception: Attention normal.        Mood and Affect: Mood normal.        Behavior: Behavior normal. Behavior is cooperative.        Thought Content: Thought content does not include homicidal or suicidal ideation. Thought content does not include homicidal or suicidal plan.      Comments: sleepy     ED Results / Procedures / Treatments   Labs (all labs ordered are listed, but only abnormal results are displayed) Labs Reviewed  COMPREHENSIVE METABOLIC PANEL - Abnormal; Notable for the following components:      Result Value   Glucose, Bld 140 (*)    All other components within normal limits  BLOOD GAS, VENOUS - Abnormal; Notable for the following components:   pO2, Ven 63 (*)    Bicarbonate 28.5 (*)    Acid-Base Excess 3.4 (*)    All other components within normal limits  SALICYLATE LEVEL - Abnormal; Notable for the following components:   Salicylate Lvl <6.0 (*)    All other components within normal limits  ACETAMINOPHEN LEVEL - Abnormal; Notable for the following components:   Acetaminophen (Tylenol), Serum <10 (*)    All other components within normal limits  MAGNESIUM - Abnormal; Notable for the following components:   Magnesium 2.6 (*)    All other components within normal limits  URINALYSIS, ROUTINE W REFLEX MICROSCOPIC - Abnormal; Notable for the following components:   Color, Urine COLORLESS (*)    Specific Gravity, Urine 1.003 (*)    Glucose, UA >=500 (*)    All other components within normal limits  CBC WITH DIFFERENTIAL/PLATELET  RAPID URINE DRUG SCREEN, HOSP PERFORMED  LACTIC ACID, PLASMA  LACTIC ACID, PLASMA  TSH  TROPONIN I (HIGH SENSITIVITY)  TROPONIN I (HIGH SENSITIVITY)    EKG EKG Interpretation  Date/Time:  Thursday September 07 2022 18:28:58 EST Ventricular Rate:  84 PR Interval:  141 QRS Duration: 92 QT Interval:  389 QTC Calculation: 460 R Axis:   -4 Text Interpretation: Sinus rhythm Confirmed by Wynona Dove ([id number]) on 09/07/2022 7:56:57 PM  Radiology CT Head Wo Contrast  Result Date: 09/07/2022 CLINICAL DATA:  Delirium  EXAM: CT HEAD WITHOUT CONTRAST TECHNIQUE: Contiguous axial images were obtained from the base of the skull through the vertex without intravenous contrast. RADIATION DOSE REDUCTION: This exam was  performed according to the departmental dose-optimization program which includes automated exposure control, adjustment of the mA and/or kV according to patient size and/or use of iterative reconstruction technique. COMPARISON:  07/27/2022. FINDINGS: Brain: No evidence of acute infarction, hemorrhage, hydrocephalus, extra-axial collection or mass lesion/mass effect. Vascular: No hyperdense vessel or unexpected calcification. Skull: Normal. Negative for fracture or focal lesion. Sinuses/Orbits: No acute finding. IMPRESSION: No acute intracranial process. Electronically Signed   By: Sammie Bench M.D.   On: 09/07/2022 18:14   DG Chest Portable 1 View  Result Date: 09/07/2022 CLINICAL DATA:  Dizziness EXAM: PORTABLE CHEST 1 VIEW COMPARISON:  07/27/2022 FINDINGS: Stable cardiomediastinal contours. No focal airspace consolidation, pleural effusion, or pneumothorax. IMPRESSION: No active disease. Electronically Signed   By: Davina Poke D.O.   On: 09/07/2022 16:04    Procedures Procedures    Medications Ordered in ED Medications  sodium chloride 0.9 % bolus 1,000 mL (0 mLs Intravenous Stopped 09/07/22 1752)    ED Course/ Medical Decision Making/ A&P Clinical Course as of 09/07/22 2359  Thu Sep 07, 2022  1912 Symptoms improved [SG]    Clinical Course User Index [SG] Jeanell Sparrow, DO                           Medical Decision Making Amount and/or Complexity of Data Reviewed Labs: ordered. Radiology: ordered.   This patient presents to the ED with chief complaint(s) of dizziness with pertinent past medical history of as above, schizophrenia which further complicates the presenting complaint. The complaint involves an extensive differential diagnosis and also carries with it a high risk of complications and morbidity.    Differential diagnoses for altered mental status includes but is not exclusive to alcohol, illicit or prescription medications, intracranial pathology such as stroke,  intracerebral hemorrhage, fever or infectious causes including sepsis, hypoxemia, uremia, trauma, endocrine related disorders such as diabetes, hypoglycemia, thyroid-related diseases, etc.  . Serious etiologies were considered.   The initial plan is to screening labs/imaging No focal deficit on exam, suspicion for CVA is low, seems more metabolic at this point   Additional history obtained: Additional history obtained from nursing home/care facility Records reviewed previous admission documents and prior labs imaging and home meds  Independent labs interpretation:  The following labs were independently interpreted:  Mg is mild elev, given ivf CMP with mild elev glucose, not DKA VBG stable Salicylate/apap level wnl UA with glucosuria, no ketones Trop neg x2 Tsh wnl LA wnl  Independent visualization of imaging: - I independently visualized the following imaging with scope of interpretation limited to determining acute life threatening conditions related to emergency care: [place] CXR, which revealed no acute process  Cardiac monitoring was reviewed and interpreted by myself which shows NSR  Treatment and Reassessment: IVF Po chall >> feeling much better, feels his symptoms have resolved, requesting to go home  Consultation: - Consulted or discussed management/test interpretation w/ external professional: na  Consideration for admission or further workup: Admission was considered   Pt with dizziness/peculiar sensation that he cannot describe but reports it feels abnormal. Neuro non - focal, he is back to baseline at this time. Imaging and w/u is re-assurring. Possibly mild dehydration, hypermagnesemia as contributing factor. He is ambulatory w/ steady gait and tolerating PO. Back to normal  at this time. Stable for discharge  The patient improved significantly and was discharged in stable condition. Detailed discussions were had with the patient regarding current findings, and need  for close f/u with PCP or on call doctor. The patient has been instructed to return immediately if the symptoms worsen in any way for re-evaluation. Patient verbalized understanding and is in agreement with current care plan. All questions answered prior to discharge.    Social Determinants of health: Social History   Tobacco Use   Smoking status: Former    Packs/day: 1.00    Years: 1.00    Total pack years: 1.00    Types: Cigars, Cigarettes    Start date: 07/15/2012    Quit date: 04/25/2021    Years since quitting: 1.3   Smokeless tobacco: Former    Types: Chew   Tobacco comments:    4 cigars A DAY  Vaping Use   Vaping Use: Every day  Substance Use Topics   Alcohol use: No   Drug use: No            Final Clinical Impression(s) / ED Diagnoses Final diagnoses:  Hypermagnesemia  Transient alteration of awareness  Mild dehydration    Rx / DC Orders ED Discharge Orders     None         Jeanell Sparrow, DO 09/07/22 2359    Jeanell Sparrow, DO 09/07/22 2359

## 2022-09-07 NOTE — Telephone Encounter (Signed)
Okay please set up for a split-night sleep study

## 2022-09-07 NOTE — Discharge Instructions (Signed)
It was a pleasure caring for you today in the emergency department. ° °Please return to the emergency department for any worsening or worrisome symptoms. ° ° °

## 2022-09-07 NOTE — ED Triage Notes (Signed)
EMS reports from group home, c/o dizziness and blurred vision x 1 hour. No other complaints. Pt was lying when it started. EMS reports Orthostatic and NIH negative.  BP 112/78 HR 112 RR 12 Sp02 96 RA CBG 282

## 2022-09-07 NOTE — Telephone Encounter (Signed)
Pt's group home called asking if we had a referral for a sleep study. Adv last appt w/[place] we noted the following:   05/2022- We discussed getting a repeat sleep study to evaluate his severity of sleep apnea. Patient declines at this time. Patient says he actually feels pretty good.    Group Home and HIS contact # is as follows: [facility]) [phone].  She says they would like to do a sleep study now. Pls call to advise PT. TY

## 2022-09-07 NOTE — ED Notes (Signed)
Pt and staff verbalized understanding of discharge instructions. Pt ambulated from ED with steady gait. Staff to drive home

## 2022-09-07 NOTE — Telephone Encounter (Signed)
Called group home contact [place] and she states that patient is wanting to move forward with a sleep study.   Tammy what kind of sleep study would you like for this patient?  Please advise

## 2022-09-08 NOTE — Telephone Encounter (Signed)
Order for split night study was placed.

## 2022-09-11 ENCOUNTER — Telehealth: Payer: Self-pay | Admitting: Family Medicine

## 2022-09-11 NOTE — Telephone Encounter (Signed)
Kaitlyn with [facility], requesting refill   Medication: acetaminophen (TYLENOL) 325 MG tablet   Has the patient contacted their pharmacy? No.   Preferred Pharmacy (with phone number or street name):  [facility] #[id number] - [place], [place] [place], [place] [address] Phone: [phone]    Agent: Please be advised that RX refills may take up to 3 business days. We ask that you follow-up with your pharmacy.

## 2022-09-12 ENCOUNTER — Other Ambulatory Visit: Payer: Self-pay | Admitting: Family Medicine

## 2022-09-12 ENCOUNTER — Other Ambulatory Visit: Payer: Self-pay

## 2022-09-12 ENCOUNTER — Telehealth: Payer: Self-pay | Admitting: Family Medicine

## 2022-09-12 DIAGNOSIS — K5900 Constipation, unspecified: Secondary | ICD-10-CM

## 2022-09-12 MED ORDER — ONETOUCH DELICA PLUS LANCET30G MISC: 1.0000 | Freq: Every day | 3 refills | Status: DC

## 2022-09-12 MED ORDER — ONETOUCH VERIO VI STRP: 1.0000 | ORAL_STRIP | 3 refills | Status: DC | PRN

## 2022-09-12 MED ORDER — ACETAMINOPHEN 325 MG PO TABS: ORAL_TABLET | ORAL | 0 refills | Status: AC

## 2022-09-12 MED ORDER — CLEAR SOLUBLE FIBER PO POWD: 1.0000 | Freq: Two times a day (BID) | ORAL | 11 refills | Status: DC

## 2022-09-12 NOTE — Telephone Encounter (Signed)
Prescription Request  09/12/2022  Is this a "Controlled Substance" medicine? No  LOV: 07/25/2022  What is the name of the medication or equipment?   Wheat Dextrin (CLEAR SOLUBLE FIBER) POWD [[id number]]   ONETOUCH VERIO test strip [[id number]]   Have you contacted your pharmacy to request a refill? No   Which pharmacy would you like this sent to?  [facility] #[id number] - [place], [place] [address] Phone: [phone] Fax: [phone]    Patient notified that their request is being sent to the clinical staff for review and that they should receive a response within 2 business days.   Please advise at Mobile [phone] (mobile)

## 2022-09-12 NOTE — Telephone Encounter (Signed)
Medication sent.

## 2022-09-19 ENCOUNTER — Other Ambulatory Visit: Payer: Self-pay

## 2022-09-19 MED ORDER — ONETOUCH VERIO VI STRP: 1.0000 | ORAL_STRIP | 3 refills | Status: DC | PRN

## 2022-09-21 ENCOUNTER — Other Ambulatory Visit: Payer: Self-pay | Admitting: Family Medicine

## 2022-09-21 ENCOUNTER — Other Ambulatory Visit: Payer: Self-pay

## 2022-09-21 MED ORDER — ONETOUCH VERIO VI STRP: ORAL_STRIP | 12 refills | Status: DC

## 2022-09-26 ENCOUNTER — Telehealth: Payer: Self-pay | Admitting: Family Medicine

## 2022-09-26 ENCOUNTER — Other Ambulatory Visit: Payer: Self-pay

## 2022-09-26 DIAGNOSIS — E785 Hyperlipidemia, unspecified: Secondary | ICD-10-CM

## 2022-09-26 MED ORDER — EZETIMIBE 10 MG PO TABS: 10.0000 mg | ORAL_TABLET | Freq: Every day | ORAL | 1 refills | Status: DC

## 2022-09-26 NOTE — Telephone Encounter (Signed)
Medication sent.

## 2022-09-26 NOTE — Telephone Encounter (Signed)
Prescription Request  09/26/2022  Is this a "Controlled Substance" medicine? No  LOV: 07/25/2022  What is the name of the medication or equipment? ezetimibe (ZETIA) 10 MG tablet   Have you contacted your pharmacy to request a refill? No - [facility] is calling to request refill be sent to different pharmacy than before. Please see below.   Which pharmacy would you like this sent to?  [facility] #[id number] - [place], [place] [address] Phone: [phone] Fax: [phone]    Patient notified that their request is being sent to the clinical staff for review and that they should receive a response within 2 business days.   Please advise at [place]

## 2022-09-27 DIAGNOSIS — F419 Anxiety disorder, unspecified: Secondary | ICD-10-CM | POA: Diagnosis not present

## 2022-09-27 DIAGNOSIS — F200 Paranoid schizophrenia: Secondary | ICD-10-CM | POA: Diagnosis not present

## 2022-09-27 DIAGNOSIS — F331 Major depressive disorder, recurrent, moderate: Secondary | ICD-10-CM | POA: Diagnosis not present

## 2022-10-03 ENCOUNTER — Telehealth: Payer: Self-pay | Admitting: Family Medicine

## 2022-10-03 NOTE — Telephone Encounter (Signed)
Patient's father, Worth Kober, called to inform us that he will be sending [place] paperwork from the [facility] that needs to be filled out by the patient's PCP. He is elderly so he is going to mail it to [place]. He is requesting a copy to be sent to him and he is requesting that we send to [facility] directly. The information is listed on the form.

## 2022-10-09 ENCOUNTER — Telehealth: Payer: Self-pay | Admitting: Family Medicine

## 2022-10-09 NOTE — Telephone Encounter (Signed)
Patient mailed in [facility] packet.... The note he wrote looks like he wants it faxed or mailed??? I'm not sure  I placed the packet with the note in [facility] bin up front

## 2022-10-10 NOTE — Telephone Encounter (Signed)
Mailed paperwork and faxed back Dr.Blyth  Signed unable to clear to drive

## 2022-10-11 DIAGNOSIS — R051 Acute cough: Secondary | ICD-10-CM | POA: Diagnosis not present

## 2022-10-11 DIAGNOSIS — J101 Influenza due to other identified influenza virus with other respiratory manifestations: Secondary | ICD-10-CM | POA: Diagnosis not present

## 2022-10-11 DIAGNOSIS — M791 Myalgia, unspecified site: Secondary | ICD-10-CM | POA: Diagnosis not present

## 2022-10-25 ENCOUNTER — Other Ambulatory Visit: Payer: Self-pay | Admitting: Family Medicine

## 2022-10-25 DIAGNOSIS — F331 Major depressive disorder, recurrent, moderate: Secondary | ICD-10-CM | POA: Diagnosis not present

## 2022-10-25 DIAGNOSIS — F200 Paranoid schizophrenia: Secondary | ICD-10-CM | POA: Diagnosis not present

## 2022-10-25 DIAGNOSIS — F419 Anxiety disorder, unspecified: Secondary | ICD-10-CM | POA: Diagnosis not present

## 2022-10-26 ENCOUNTER — Telehealth: Payer: Self-pay

## 2022-10-26 NOTE — Telephone Encounter (Signed)
Caller Name Reine Just Phone Number [phone] Patient Name Joseph [name] Patient DOB [birth year] Call Type Message Only Information Provided Reason for Call Medication Question / Request Initial Comment Caller states she would like to speak to Dr. Frederik Pear nurse. She needs Dr. Charlett Blake to send patients medication to [facility] pharmacy. Additional Comment Office hours provided. Triage declined. Disp. Time Disposition Final User 10/26/2022 12:21:33 PM General Information Provided Yes Joseph [name] Call Closed By: Joseph [name] Transaction Date/Time: 10/26/2022 12:18:33 PM (ET)

## 2022-10-27 ENCOUNTER — Other Ambulatory Visit: Payer: Self-pay

## 2022-10-27 MED ORDER — MULTIVITAMIN PO TABS: 1.0000 | ORAL_TABLET | Freq: Every day | ORAL | 1 refills | Status: DC

## 2022-10-27 MED ORDER — MULTIVITAMIN MEN PO TABS: 1.0000 | ORAL_TABLET | Freq: Every day | ORAL | 3 refills | Status: DC

## 2022-10-27 NOTE — Telephone Encounter (Signed)
Called pt caregiver and medication was sent

## 2022-11-09 ENCOUNTER — Other Ambulatory Visit: Payer: Self-pay | Admitting: Family Medicine

## 2022-11-09 DIAGNOSIS — K5900 Constipation, unspecified: Secondary | ICD-10-CM

## 2022-11-10 ENCOUNTER — Other Ambulatory Visit: Payer: Self-pay

## 2022-11-10 ENCOUNTER — Telehealth: Payer: Self-pay | Admitting: Family Medicine

## 2022-11-10 DIAGNOSIS — K5900 Constipation, unspecified: Secondary | ICD-10-CM

## 2022-11-10 MED ORDER — CLEAR SOLUBLE FIBER PO POWD: 1.0000 | Freq: Two times a day (BID) | ORAL | 11 refills | Status: DC

## 2022-11-10 NOTE — Telephone Encounter (Signed)
Medication sent..

## 2022-11-10 NOTE — Telephone Encounter (Signed)
Prescription Request  11/10/2022  Is this a "Controlled Substance" medicine? No  LOV: 07/25/2022  What is the name of the medication or equipment?  Wheat Dextrin (CLEAR SOLUBLE FIBER) POWD   Have you contacted your pharmacy to request a refill? No   Which pharmacy would you like this sent to?  [facility] [id number] - [address], [address] [place] [address] Phone: [phone] Fax: [phone]    Patient notified that their request is being sent to the clinical staff for review and that they should receive a response within 2 business days.   Please advise at [place]

## 2022-11-15 ENCOUNTER — Other Ambulatory Visit: Payer: Self-pay

## 2022-11-15 ENCOUNTER — Telehealth: Payer: Self-pay | Admitting: Family Medicine

## 2022-11-15 DIAGNOSIS — K5900 Constipation, unspecified: Secondary | ICD-10-CM

## 2022-11-15 MED ORDER — CLEAR SOLUBLE FIBER PO POWD: 1.0000 | Freq: Two times a day (BID) | ORAL | 11 refills | Status: DC

## 2022-11-15 NOTE — Telephone Encounter (Signed)
Janeane, [employer], said she called [facility] and they only received the miralax powder not the Clear Soluble Fiber powder so she wants to see if that can be sent in. Please call her if any questions at [phone]

## 2022-11-15 NOTE — Telephone Encounter (Signed)
Medication sent..

## 2022-11-22 ENCOUNTER — Other Ambulatory Visit: Payer: Self-pay

## 2022-11-22 ENCOUNTER — Telehealth: Payer: Self-pay | Admitting: Family Medicine

## 2022-11-22 MED ORDER — DOCUSATE SODIUM 100 MG PO CAPS: ORAL_CAPSULE | ORAL | 2 refills | Status: DC

## 2022-11-22 NOTE — Assessment & Plan Note (Signed)
Well controlled, no changes to meds. Encouraged heart healthy diet such as the DASH diet and exercise as tolerated.  °

## 2022-11-22 NOTE — Assessment & Plan Note (Signed)
hgba1c acceptable, minimize simple carbs. Increase exercise as tolerated. Continue current meds 

## 2022-11-22 NOTE — Assessment & Plan Note (Signed)
Encourage heart healthy diet such as MIND or DASH diet, increase exercise, avoid trans fats, simple carbohydrates and processed foods, consider a krill or fish or flaxseed oil cap daily.  °

## 2022-11-22 NOTE — Assessment & Plan Note (Addendum)
Here today from his group home and appears to be doing better the home offers no concerns.

## 2022-11-22 NOTE — Telephone Encounter (Signed)
Joseph [name] also stated she need instructions on pt's test strips. Please Advise.  Prescription Request  11/22/2022  Is this a "Controlled Substance" medicine? No  LOV: 07/25/2022  What is the name of the medication or equipment?   docusate sodium (COLACE) 100 MG capsule [id number]   Have you contacted your pharmacy to request a refill? No   Which pharmacy would you like this sent to?  [facility] [id number] - [address], [address] [address] Phone: [phone] Fax: [phone]    Patient notified that their request is being sent to the clinical staff for review and that they should receive a response within 2 business days.   Please advise at Mobile [phone] (mobile)

## 2022-11-22 NOTE — Assessment & Plan Note (Signed)
Encouraged increased hydration and fiber in diet. Daily probiotics. If bowels not moving can use MOM 2 tbls po in 4 oz of warm prune juice by mouth every 2-3 days. If no results then repeat in 4 hours with  Dulcolax suppository pr, may repeat again in 4 more hours as needed. Seek care if symptoms worsen. Consider daily Miralax and/or Dulcolax if symptoms persist.  

## 2022-11-22 NOTE — Telephone Encounter (Signed)
Medication sent.

## 2022-11-23 ENCOUNTER — Ambulatory Visit (INDEPENDENT_AMBULATORY_CARE_PROVIDER_SITE_OTHER): Payer: Medicare Other | Admitting: Family Medicine

## 2022-11-23 VITALS — BP 120/64 | HR 84 | Temp 98.0°F | Resp 16 | Ht 73.0 in | Wt 275.8 lb

## 2022-11-23 DIAGNOSIS — F1010 Alcohol abuse, uncomplicated: Secondary | ICD-10-CM

## 2022-11-23 DIAGNOSIS — K5900 Constipation, unspecified: Secondary | ICD-10-CM

## 2022-11-23 DIAGNOSIS — E785 Hyperlipidemia, unspecified: Secondary | ICD-10-CM

## 2022-11-23 DIAGNOSIS — I10 Essential (primary) hypertension: Secondary | ICD-10-CM

## 2022-11-23 DIAGNOSIS — E119 Type 2 diabetes mellitus without complications: Secondary | ICD-10-CM

## 2022-11-23 MED ORDER — DOCUSATE SODIUM 100 MG PO CAPS: ORAL_CAPSULE | ORAL | 11 refills | Status: DC

## 2022-11-23 MED ORDER — ONETOUCH VERIO VI STRP: 1.0000 | ORAL_STRIP | Freq: Two times a day (BID) | 12 refills | Status: DC

## 2022-11-23 NOTE — Progress Notes (Signed)
Subjective:   By signing my name below, I, Joseph [name], attest that this documentation has been prepared under the direction and in the presence of Joseph Lukes, MD., 11/23/2022.   Patient ID: Joseph [name], male    DOB: [birth year], 47 y.o.   MRN: [id number]  Chief Complaint  Patient presents with   Follow-up    Follow up   HPI Patient is in today for an office visit and is accompanied by his caregiver. He denies CP/palpitations/SOB/HA/congestion/ fever/chills/GI or GU symptoms.  Endocrinology His last HGBA1C was elevated and his caregiver reports that he has an upcoming appointment with his endocrinologist next month. He checks his blood glucose twice daily. Lab Results  Component Value Date   HGBA1C 6.6 (H) 07/15/2022   Influenza He reports that last month he contracted Influenza, which caused him to have a fever and upset stomach. He has fully recovered.  Leg Pain Patient complains of bilateral lower leg aches and pain. He has not been walking daily due to the cold weather and suspects this is causing the pain. Denies CP/SOB.   Social History He has ceased drinking alcohol and smoking but states that he now vapes.  Supplements He continues taking multivitamins daily.  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[place])    Constipation in male    Depression    Diabetes ([place])    no medications    Elevated BP 10/20/2012   Enlarged thyroid 03/07/2015   H/O tobacco use, presenting hazards to health 12/01/2011   Quit October 2014    Hyperlipidemia    Lupus anticoagulant positive 08/15/2011   Medicare annual wellness visit, subsequent 03/19/2016   Obesity    Schizophrenia ([place])    Sleep apnea    wears cpap, no 02    Past Surgical  History:  Procedure Laterality Date   COLONOSCOPY     eye surgery     b/l laser eye sx 2004   I & D EXTREMITY     left leg   LEG SURGERY     TONSILLECTOMY     WISDOM TOOTH EXTRACTION      Family History  Problem Relation Age of Onset   Diverticulosis Father    Stomach cancer Mother        stomach CA dx at 82   GER disease Mother    Cancer Mother        stomach   Colon cancer Neg Hx    Pancreatic cancer Neg Hx    Esophageal cancer Neg Hx    Colon polyps Neg Hx    Rectal cancer Neg Hx     Social History   Socioeconomic History   Marital status: Single    Spouse name: Not on file   Number of children: 0   Years of education: Not on file   Highest education level: Not on file  Occupational History    Employer: [employer]    Comment: [employer]  Tobacco Use   Smoking status: Former    Packs/day: 1.00    Years: 1.00    Total pack years: 1.00    Types: Cigars, Cigarettes    Start date: 07/15/2012  Quit date: 04/25/2021    Years since quitting: 1.5   Smokeless tobacco: Former    Types: Chew   Tobacco comments:    4 cigars A DAY  Vaping Use   Vaping Use: Every day  Substance and Sexual Activity   Alcohol use: No   Drug use: No   Sexual activity: Not Currently  Other Topics Concern   Not on file  Social History Narrative   Lives in a [place] - his dad pays for it - Visits with Dad every Saturday and Sunday   Social Determinants of Health   Financial Resource Strain: Low Risk  (06/04/2021)   Overall Financial Resource Strain (CARDIA)    Difficulty of Paying Living Expenses: Not hard at all  Food Insecurity: No Food Insecurity (07/27/2022)   Hunger Vital Sign    Worried About Running Out of Food in the Last Year: Never true    Ran Out of Food in the Last Year: Never true  Transportation Needs: No Transportation Needs (07/27/2022)   PRAPARE - Hydrologist (Medical): No    Lack of Transportation (Non-Medical): No   Physical Activity: Sufficiently Active (06/04/2021)   Exercise Vital Sign    Days of Exercise per Week: 7 days    Minutes of Exercise per Session: 60 min  Stress: No Stress Concern Present (06/04/2021)   [place]    Feeling of Stress : Not at all  Social Connections: Moderately Integrated (06/04/2021)   Social Connection and Isolation Panel [NHANES]    Frequency of Communication with Friends and Family: More than three times a week    Frequency of Social Gatherings with Friends and Family: More than three times a week    Attends Religious Services: More than 4 times per year    Active Member of [employer] or Organizations: Yes    Attends Archivist Meetings: More than 4 times per year    Marital Status: Never married  Intimate Partner Violence: Not At Risk (07/27/2022)   Humiliation, Afraid, Rape, and Kick questionnaire    Fear of Current or Ex-Partner: No    Emotionally Abused: No    Physically Abused: No    Sexually Abused: No    Outpatient Medications Prior to Visit  Medication Sig Dispense Refill   acetaminophen (TYLENOL) 325 MG tablet 1 tab po every 8 hours as needed for pain 30 tablet 0   aspirin EC (ASPIR-LOW) 81 MG tablet Take 1 tablet (81 mg total) by mouth daily. Swallow whole. 90 tablet 3   clonazePAM (KLONOPIN) 1 MG tablet Take 1 mg by mouth 2 (two) times daily.     divalproex (DEPAKOTE ER) 500 MG 24 hr tablet Take 1,000 mg by mouth at bedtime.     ezetimibe (ZETIA) 10 MG tablet Take 1 tablet (10 mg total) by mouth daily. 90 tablet 1   FLUoxetine (PROZAC) 20 MG tablet Take 20 mg by mouth at bedtime.     Lancets (ONETOUCH DELICA PLUS Q000111Q) MISC 1 each by Other route daily. Glucose 90 each 3   lisinopril (ZESTRIL) 2.5 MG tablet TAKE ONE TABLET BY MOUTH DAILY (Patient taking differently: Take 2.5 mg by mouth daily.) 90 tablet 3   loratadine (CLARITIN) 10 MG tablet TAKE 1 TABLET(10 MG) BY MOUTH DAILY  (Patient taking differently: Take 10 mg by mouth daily. TAKE 1 TABLET(10 MG) BY MOUTH DAILY) 90 tablet 3   Multiple Vitamin (MULTIVITAMIN) TABS  Take 1 tablet by mouth daily. 90 tablet 1   OLANZapine (ZYPREXA) 2.5 MG tablet Take 2.5 mg by mouth daily as needed.     polyethylene glycol powder (GLYCOLAX/MIRALAX) 17 GM/SCOOP powder TAKE 17 GRAMS BY MOUTH DAILY (Patient taking differently: Take 17 g by mouth daily.) 238 g 5   rosuvastatin (CRESTOR) 10 MG tablet Take 10 mg by mouth daily.     SYNJARDY 12.01-999 MG TABS Take 1 tablet by mouth 2 (two) times daily.     Wheat Dextrin (CLEAR SOLUBLE FIBER) POWD Take 1 Dose by mouth in the morning and at bedtime. 477 g 11   docusate sodium (COLACE) 100 MG capsule TAKE 1 CAPSULE(100 MG) BY MOUTH TWICE DAILY 60 capsule 2   ONETOUCH VERIO test strip Check blood sugars once daily 100 each 12   No facility-administered medications prior to visit.    Allergies  Allergen Reactions   Haloperidol Other (See Comments)   Statins Other (See Comments)    ? Related to compartment syndrome   Aripiprazole Other (See Comments)   Haldol [Haloperidol Decanoate]     Painful, HA   Risperidone And Related     Risperdol per pt     Review of Systems  Constitutional:  Negative for chills and fever.  HENT:  Negative for congestion.   Respiratory:  Negative for shortness of breath.   Cardiovascular:  Negative for chest pain and palpitations.  Gastrointestinal:  Negative for abdominal pain, blood in stool, constipation, diarrhea, nausea and vomiting.  Genitourinary:  Negative for dysuria, frequency, hematuria and urgency.  Musculoskeletal:        (+) bilateral lower leg aches and pain.  Skin:           Neurological:  Negative for headaches.       Objective:    Physical Exam Constitutional:      General: He is not in acute distress.    Appearance: Normal appearance. He is normal weight. He is not ill-appearing.  HENT:     Head: Normocephalic and atraumatic.      Right Ear: External ear normal.     Left Ear: External ear normal.     Nose: Nose normal.     Mouth/Throat:     Mouth: Mucous membranes are moist.     Pharynx: Oropharynx is clear.  Eyes:     General:        Right eye: No discharge.        Left eye: No discharge.     Extraocular Movements: Extraocular movements intact.     Conjunctiva/sclera: Conjunctivae normal.     Pupils: Pupils are equal, round, and reactive to light.  Cardiovascular:     Rate and Rhythm: Normal rate and regular rhythm.     Pulses: Normal pulses.     Heart sounds: Normal heart sounds. No murmur heard.    No gallop.  Pulmonary:     Effort: Pulmonary effort is normal. No respiratory distress.     Breath sounds: Normal breath sounds. No wheezing or rales.  Abdominal:     General: Bowel sounds are normal.     Palpations: Abdomen is soft.     Tenderness: There is no abdominal tenderness. There is no guarding.  Musculoskeletal:        General: Normal range of motion.     Cervical back: Normal range of motion.     Right lower leg: No edema.     Left lower leg: No edema.  Skin:  General: Skin is warm and dry.  Neurological:     Mental Status: He is alert and oriented to person, place, and time.  Psychiatric:        Mood and Affect: Mood normal.        Behavior: Behavior normal.        Judgment: Judgment normal.     BP 120/64 (BP Location: Right Arm, Patient Position: Sitting, Cuff Size: Large)   Pulse 84   Temp 98 F (36.7 C) (Oral)   Resp 16   Ht '6\' 1"'$  (1.854 m)   Wt 275 lb 12.8 oz (125.1 kg)   SpO2 96%   BMI 36.39 kg/m  Wt Readings from Last 3 Encounters:  11/23/22 275 lb 12.8 oz (125.1 kg)  07/27/22 244 lb 14.9 oz (111.1 kg)  07/25/22 255 lb (115.7 kg)    Diabetic Foot Exam - Simple   No data filed    Lab Results  Component Value Date   WBC 8.2 09/07/2022   HGB 15.2 09/07/2022   HCT 45.2 09/07/2022   PLT 265 09/07/2022   GLUCOSE 140 (H) 09/07/2022   CHOL 104 07/25/2022   TRIG  97.0 07/25/2022   HDL 47.70 07/25/2022   LDLDIRECT 42.0 11/24/2021   LDLCALC 37 07/25/2022   ALT 23 09/07/2022   AST 22 09/07/2022   NA 139 09/07/2022   K 4.7 09/07/2022   CL 106 09/07/2022   CREATININE 0.81 09/07/2022   BUN 14 09/07/2022   CO2 25 09/07/2022   TSH 2.663 09/07/2022   PSA 0.31 08/05/2019   INR 1.0 07/15/2022   HGBA1C 6.6 (H) 07/15/2022   MICROALBUR 0.50 07/03/2013    Lab Results  Component Value Date   TSH 2.663 09/07/2022   Lab Results  Component Value Date   WBC 8.2 09/07/2022   HGB 15.2 09/07/2022   HCT 45.2 09/07/2022   MCV 97.8 09/07/2022   PLT 265 09/07/2022   Lab Results  Component Value Date   NA 139 09/07/2022   K 4.7 09/07/2022   CO2 25 09/07/2022   GLUCOSE 140 (H) 09/07/2022   BUN 14 09/07/2022   CREATININE 0.81 09/07/2022   BILITOT 0.5 09/07/2022   ALKPHOS 59 09/07/2022   AST 22 09/07/2022   ALT 23 09/07/2022   PROT 7.8 09/07/2022   ALBUMIN 4.2 09/07/2022   CALCIUM 9.3 09/07/2022   ANIONGAP 8 09/07/2022   GFR 85.89 08/08/2022   Lab Results  Component Value Date   CHOL 104 07/25/2022   Lab Results  Component Value Date   HDL 47.70 07/25/2022   Lab Results  Component Value Date   LDLCALC 37 07/25/2022   Lab Results  Component Value Date   TRIG 97.0 07/25/2022   Lab Results  Component Value Date   CHOLHDL 2 07/25/2022   Lab Results  Component Value Date   HGBA1C 6.6 (H) 07/15/2022      Assessment & Plan:  Alcohol Abuse: Patient has ceased drinking alcohol.  Constipation: Docusate refilled.  Diabetes Mellitus Type 2: HGBA1C will be checked with endocrinologist. Patient is checking his blood glucose twice daily.  Labs: Blood work today will check magnesium. Problem List Items Addressed This Visit     Alcohol abuse - Primary    Here today from his group home and appears to be doing better the home offers no concerns.       Constipation    Encouraged increased hydration and fiber in diet. Daily probiotics. If  bowels not moving can  use MOM 2 tbls po in 4 oz of warm prune juice by mouth every 2-3 days. If no results then repeat in 4 hours with  Dulcolax suppository pr, may repeat again in 4 more hours as needed. Seek care if symptoms worsen. Consider daily Miralax and/or Dulcolax if symptoms persist.        HTN (hypertension)    Well controlled, no changes to meds. Encouraged heart healthy diet such as the DASH diet and exercise as tolerated.        Hyperlipidemia, mild    Encourage heart healthy diet such as MIND or DASH diet, increase exercise, avoid trans fats, simple carbohydrates and processed foods, consider a krill or fish or flaxseed oil cap daily.       Type 2 diabetes mellitus without complications (HCC)    [id number] acceptable, minimize simple carbs. Increase exercise as tolerated. Continue current meds      Other Visit Diagnoses     Magnesium disorder       Relevant Orders   Magnesium      Meds ordered this encounter  Medications   docusate sodium (COLACE) 100 MG capsule    Sig: TAKE 1 CAPSULE(100 MG) BY MOUTH TWICE DAILY    Dispense:  60 capsule    Refill:  11   ONETOUCH VERIO test strip    Sig: 1 each by Other route in the morning and at bedtime.    Dispense:  100 each    Refill:  12    Dx: E11.9   I, Penni Homans, MD, personally preformed the services described in this documentation.  All medical record entries made by the scribe were at my direction and in my presence.  I have reviewed the chart and discharge instructions (if applicable) and agree that the record reflects my personal performance and is accurate and complete. 11/23/2022  I,Mohammed Iqbal,acting as a scribe for Penni Homans, MD.,have documented all relevant documentation on the behalf of Penni Homans, MD,as directed by  Penni Homans, MD while in the presence of Penni Homans, MD.  Penni Homans, MD

## 2022-12-06 DIAGNOSIS — E119 Type 2 diabetes mellitus without complications: Secondary | ICD-10-CM | POA: Diagnosis not present

## 2022-12-06 DIAGNOSIS — H5213 Myopia, bilateral: Secondary | ICD-10-CM | POA: Diagnosis not present

## 2022-12-07 DIAGNOSIS — E663 Overweight: Secondary | ICD-10-CM | POA: Diagnosis not present

## 2022-12-07 DIAGNOSIS — E119 Type 2 diabetes mellitus without complications: Secondary | ICD-10-CM | POA: Diagnosis not present

## 2022-12-07 DIAGNOSIS — E7800 Pure hypercholesterolemia, unspecified: Secondary | ICD-10-CM | POA: Diagnosis not present

## 2022-12-07 DIAGNOSIS — I10 Essential (primary) hypertension: Secondary | ICD-10-CM | POA: Diagnosis not present

## 2022-12-25 ENCOUNTER — Other Ambulatory Visit: Payer: Self-pay | Admitting: Family Medicine

## 2022-12-25 ENCOUNTER — Telehealth: Payer: Self-pay | Admitting: Family Medicine

## 2022-12-25 NOTE — Telephone Encounter (Signed)
Medication: loratadine (CLARITIN) 10 MG tablet  Has the patient contacted their pharmacy? No.   Preferred Pharmacy: [facility] [place], [address] [place], [place] [address] Phone: [phone]  Fax: [phone]

## 2022-12-25 NOTE — Telephone Encounter (Signed)
Refill sent.

## 2023-01-04 ENCOUNTER — Other Ambulatory Visit (HOSPITAL_BASED_OUTPATIENT_CLINIC_OR_DEPARTMENT_OTHER): Payer: Self-pay

## 2023-01-04 ENCOUNTER — Other Ambulatory Visit (HOSPITAL_COMMUNITY): Payer: Self-pay

## 2023-01-04 MED ORDER — CLONAZEPAM 1 MG PO TABS
1.0000 mg | ORAL_TABLET | Freq: Two times a day (BID) | ORAL | 0 refills | Status: DC
  Filled 2023-01-04 (×2): qty 60, 30d supply, fill #0

## 2023-01-16 ENCOUNTER — Other Ambulatory Visit: Payer: Self-pay

## 2023-01-16 MED ORDER — ONETOUCH DELICA PLUS LANCET30G MISC: 1.0000 | Freq: Every day | 3 refills | Status: AC

## 2023-01-16 MED ORDER — ONETOUCH VERIO VI STRP: 1.0000 | ORAL_STRIP | Freq: Two times a day (BID) | 12 refills | Status: AC

## 2023-01-17 DIAGNOSIS — F418 Other specified anxiety disorders: Secondary | ICD-10-CM | POA: Diagnosis not present

## 2023-01-17 DIAGNOSIS — F331 Major depressive disorder, recurrent, moderate: Secondary | ICD-10-CM | POA: Diagnosis not present

## 2023-01-17 DIAGNOSIS — F200 Paranoid schizophrenia: Secondary | ICD-10-CM | POA: Diagnosis not present

## 2023-01-19 ENCOUNTER — Other Ambulatory Visit: Payer: Self-pay | Admitting: Family Medicine

## 2023-01-19 ENCOUNTER — Other Ambulatory Visit: Payer: Self-pay

## 2023-01-19 ENCOUNTER — Telehealth: Payer: Self-pay | Admitting: Family Medicine

## 2023-01-19 DIAGNOSIS — K5900 Constipation, unspecified: Secondary | ICD-10-CM

## 2023-01-19 MED ORDER — CLEAR SOLUBLE FIBER PO POWD: 1.0000 | Freq: Two times a day (BID) | ORAL | 11 refills | Status: AC

## 2023-01-19 NOTE — Telephone Encounter (Signed)
[facility] called to request refill on patient's fiber powder.  Wheat Dextrin (CLEAR SOLUBLE FIBER) POWD   Please send to:   [facility] #[id number] [place], [place] - [address] AT [facility] [address], [place] [address] Phone: [phone]  Fax: [phone]

## 2023-01-19 NOTE — Telephone Encounter (Signed)
Medication sent.

## 2023-01-30 ENCOUNTER — Telehealth: Payer: Self-pay | Admitting: Family Medicine

## 2023-01-30 ENCOUNTER — Other Ambulatory Visit: Payer: Self-pay | Admitting: Family Medicine

## 2023-01-30 NOTE — Telephone Encounter (Signed)
Prescription Request  01/30/2023  Is this a "Controlled Substance" medicine? No  LOV: 11/23/2022  What is the name of the medication or equipment?aspirin EC (ASPIR-LOW) 81 MG tablet   Have you contacted your pharmacy to request a refill? No   Which pharmacy would you like this sent to?  [facility] #[id number] - [place], [place] - [address] AT [facility] OF [address] [place] [address] Phone: [phone] Fax: [phone]    Patient notified that their request is being sent to the clinical staff for review and that they should receive a response within 2 business days.   Please advise at Mobile [phone] (mobile)

## 2023-01-31 ENCOUNTER — Other Ambulatory Visit: Payer: Self-pay

## 2023-01-31 MED ORDER — ASPIRIN 81 MG PO TBEC: 81.0000 mg | DELAYED_RELEASE_TABLET | Freq: Every day | ORAL | 5 refills | Status: DC

## 2023-01-31 NOTE — Telephone Encounter (Signed)
Medication sent.

## 2023-02-22 ENCOUNTER — Encounter: Payer: Self-pay | Admitting: Family Medicine

## 2023-02-22 ENCOUNTER — Ambulatory Visit (HOSPITAL_BASED_OUTPATIENT_CLINIC_OR_DEPARTMENT_OTHER)
Admission: RE | Admit: 2023-02-22 | Discharge: 2023-02-22 | Disposition: A | Discharge status: Home / Self Care | Payer: Medicare Other | Source: Ambulatory Visit | Attending: Family Medicine | Admitting: Family Medicine

## 2023-02-22 ENCOUNTER — Ambulatory Visit (INDEPENDENT_AMBULATORY_CARE_PROVIDER_SITE_OTHER): Payer: Medicare Other | Admitting: Family Medicine

## 2023-02-22 VITALS — BP 100/70 | HR 99 | Temp 98.2°F | Resp 18 | Ht 73.0 in | Wt 272.2 lb

## 2023-02-22 DIAGNOSIS — M25572 Pain in left ankle and joints of left foot: Secondary | ICD-10-CM

## 2023-02-22 DIAGNOSIS — M25472 Effusion, left ankle: Secondary | ICD-10-CM | POA: Insufficient documentation

## 2023-02-22 DIAGNOSIS — R600 Localized edema: Secondary | ICD-10-CM | POA: Diagnosis not present

## 2023-02-22 MED ORDER — MELOXICAM 15 MG PO TABS: ORAL_TABLET | ORAL | 1 refills | Status: DC

## 2023-02-22 NOTE — Progress Notes (Signed)
Subjective:   By signing my name below, I, Joseph [name], attest that this documentation has been prepared under the direction and in the presence of Donato Schultz, DO 02/22/23   Patient ID: Joseph [name], male    DOB: 18-Apr-1976, 47 y.o.   MRN: [id number]  Chief Complaint  Patient presents with   Ankle Pain    Left, x2 weeks, no swelling, pain with walking and walking stairs. No falls or injuries.     Left Ankle Pain Patient is in today for left ankle pain since 01/29/23. Denies injury, increased activity. Worse when walking on stairs. Walks 1-2 times per day. Has not taken anything. History of bilateral leg surgery for compartment syndrome.  Past Medical History:  Diagnosis Date   Anemia    post operative   Antiphospholipid syndrome (HCC) 08/15/2011   Anxiety    Blood transfusion without reported diagnosis    x 2 per pt    Cellulitis    bilateral thighs   Cerumen impaction 07/13/2013   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HCC)    Constipation in male    Depression    Diabetes (HCC)    no medications    Elevated BP 10/20/2012   Enlarged thyroid 03/07/2015   H/O tobacco use, presenting hazards to health 12/01/2011   Quit October 2014    Hyperlipidemia    Lupus anticoagulant positive 08/15/2011   Medicare annual wellness visit, subsequent 03/19/2016   Obesity    Schizophrenia (HCC)    Sleep apnea    wears cpap, no 02    Past Surgical History:  Procedure Laterality Date   COLONOSCOPY     eye surgery     b/l laser eye sx 2004   I & D EXTREMITY     left leg   LEG SURGERY     TONSILLECTOMY     WISDOM TOOTH EXTRACTION      Family History  Problem Relation Age of Onset   Diverticulosis Father    Stomach cancer Mother        stomach CA dx at 60   GER disease Mother    Cancer Mother        stomach   Colon cancer Neg Hx    Pancreatic cancer Neg Hx    Esophageal cancer Neg Hx    Colon polyps Neg Hx    Rectal cancer Neg Hx     Social History   Socioeconomic  History   Marital status: Single    Spouse name: Not on file   Number of children: 0   Years of education: Not on file   Highest education level: Not on file  Occupational History    Employer: [employer]    Comment: [employer]  Tobacco Use   Smoking status: Former    Packs/day: 1.00    Years: 1.00    Additional pack years: 0.00    Total pack years: 1.00    Types: Cigars, Cigarettes    Start date: 07/15/2012    Quit date: 04/25/2021    Years since quitting: 1.8   Smokeless tobacco: Former    Types: Chew   Tobacco comments:    4 cigars A DAY  Vaping Use   Vaping Use: Every day  Substance and Sexual Activity   Alcohol use: No   Drug use: No   Sexual activity: Not Currently  Other Topics Concern   Not on file  Social History Narrative   Lives in a  Group Home - his dad pays for it - Visits with Dad every Saturday and Sunday   Social Determinants of Health   Financial Resource Strain: Low Risk  (06/04/2021)   Overall Financial Resource Strain (CARDIA)    Difficulty of Paying Living Expenses: Not hard at all  Food Insecurity: No Food Insecurity (07/27/2022)   Hunger Vital Sign    Worried About Running Out of Food in the Last Year: Never true    Ran Out of Food in the Last Year: Never true  Transportation Needs: No Transportation Needs (07/27/2022)   PRAPARE - Administrator, Civil Service (Medical): No    Lack of Transportation (Non-Medical): No  Physical Activity: Sufficiently Active (06/04/2021)   Exercise Vital Sign    Days of Exercise per Week: 7 days    Minutes of Exercise per Session: 60 min  Stress: No Stress Concern Present (06/04/2021)   [employer] of Occupational Health - Occupational Stress Questionnaire    Feeling of Stress : Not at all  Social Connections: Moderately Integrated (06/04/2021)   Social Connection and Isolation Panel [NHANES]    Frequency of Communication with Friends and Family: More than three times a week    Frequency of  Social Gatherings with Friends and Family: More than three times a week    Attends Religious Services: More than 4 times per year    Active Member of [employer] or Organizations: Yes    Attends Banker Meetings: More than 4 times per year    Marital Status: Never married  Intimate Partner Violence: Not At Risk (07/27/2022)   Humiliation, Afraid, Rape, and Kick questionnaire    Fear of Current or Ex-Partner: No    Emotionally Abused: No    Physically Abused: No    Sexually Abused: No    Outpatient Medications Prior to Visit  Medication Sig Dispense Refill   acetaminophen (TYLENOL) 325 MG tablet 1 tab po every 8 hours as needed for pain 30 tablet 0   aspirin EC (ASPIRIN LOW DOSE) 81 MG tablet Take 1 tablet (81 mg total) by mouth daily. 30 tablet 5   clonazePAM (KLONOPIN) 1 MG tablet Take 1 mg by mouth 2 (two) times daily.     clonazePAM (KLONOPIN) 1 MG tablet Take 1 tablet (1 mg total) by mouth 2 (two) times daily. 60 tablet 0   divalproex (DEPAKOTE ER) 500 MG 24 hr tablet Take 1,000 mg by mouth at bedtime.     docusate sodium (COLACE) 100 MG capsule TAKE 1 CAPSULE(100 MG) BY MOUTH TWICE DAILY 60 capsule 11   ezetimibe (ZETIA) 10 MG tablet Take 1 tablet (10 mg total) by mouth daily. 90 tablet 1   FLUoxetine (PROZAC) 20 MG tablet Take 20 mg by mouth at bedtime.     Lancets (ONETOUCH DELICA PLUS LANCET30G) MISC 1 each by Other route daily. Glucose 90 each 3   lisinopril (ZESTRIL) 2.5 MG tablet TAKE ONE TABLET BY MOUTH DAILY (Patient taking differently: Take 2.5 mg by mouth daily.) 90 tablet 3   loratadine (CLARITIN) 10 MG tablet Take 1 tablet (10 mg total) by mouth daily. TAKE 1 TABLET(10 MG) BY MOUTH DAILY 30 tablet 2   Multiple Vitamin (MULTIVITAMIN) TABS Take 1 tablet by mouth daily. 90 tablet 1   OLANZapine (ZYPREXA) 2.5 MG tablet Take 2.5 mg by mouth daily as needed.     ONETOUCH VERIO test strip 1 each by Other route in the morning and at bedtime. 100 each 12  polyethylene  glycol powder (GLYCOLAX/MIRALAX) 17 GM/SCOOP powder DISSOLVE 17 GRAMS IN LIQUID AND DRINK BY MOUTH DAILY 238 g 5   rosuvastatin (CRESTOR) 10 MG tablet Take 10 mg by mouth daily.     SYNJARDY 12.01-999 MG TABS Take 1 tablet by mouth 2 (two) times daily.     Wheat Dextrin (CLEAR SOLUBLE FIBER) POWD Take 1 Dose by mouth in the morning and at bedtime. 477 g 11   No facility-administered medications prior to visit.    Allergies  Allergen Reactions   Haloperidol Other (See Comments)   Statins Other (See Comments)    ? Related to compartment syndrome   Aripiprazole Other (See Comments)   Haldol [Haloperidol Decanoate]     Painful, HA   Risperidone And Related     Risperdol per pt     Review of Systems  Constitutional:  Negative for fever and malaise/fatigue.  HENT:  Negative for congestion.   Eyes:  Negative for blurred vision.  Respiratory:  Negative for cough and shortness of breath.   Cardiovascular:  Negative for chest pain, palpitations and leg swelling.  Gastrointestinal:  Negative for vomiting.  Musculoskeletal:  Positive for joint pain (Left ankle). Negative for back pain.  Skin:  Negative for rash.  Neurological:  Negative for loss of consciousness and headaches.       Objective:    Physical Exam Vitals and nursing note reviewed.  Constitutional:      Appearance: He is well-developed.  HENT:     Head: Normocephalic.  Eyes:     Conjunctiva/sclera: Conjunctivae normal.     Pupils: Pupils are equal, round, and reactive to light.  Pulmonary:     Effort: Pulmonary effort is normal.  Musculoskeletal:        General: Swelling and tenderness present. No deformity or signs of injury. Normal range of motion.     Cervical back: Normal range of motion.     Comments: Swelling, tenderness left ankle all compartments.  Skin:    General: Skin is warm and dry.  Neurological:     General: No focal deficit present.     Mental Status: He is alert and oriented to person, place, and  time.     Gait: Gait abnormal.  Psychiatric:        Behavior: Behavior normal.        Thought Content: Thought content normal.        Judgment: Judgment normal.     BP 100/70 (BP Location: Right Arm, Patient Position: Sitting, Cuff Size: Large)   Pulse 99   Temp 98.2 F (36.8 C) (Oral)   Resp 18   Ht 6\' 1"  (1.854 m)   Wt 272 lb 3.2 oz (123.5 kg)   SpO2 95%   BMI 35.91 kg/m  Wt Readings from Last 3 Encounters:  02/22/23 272 lb 3.2 oz (123.5 kg)  11/23/22 275 lb 12.8 oz (125.1 kg)  07/27/22 244 lb 14.9 oz (111.1 kg)       Assessment & Plan:  Pain and swelling of left ankle -     DG Ankle Complete Left; Future -     Meloxicam; 1/2 -1 po qd as needed  Dispense: 30 tablet; Refill: 1  Elevate, ankle brace  Ice  Check xray   I, Donato Schultz, DO, have reviewed all documentation for this visit. The documentation on 02/22/23 for the exam, diagnosis, procedures, and orders are all accurate and complete.   I,Alexander Ruley,acting as a Neurosurgeon for [employer]  R Zola Button, DO.,have documented all relevant documentation on the behalf of Donato Schultz, DO,as directed by  Donato Schultz, DO while in the presence of Donato Schultz, DO.

## 2023-03-06 ENCOUNTER — Telehealth: Payer: Self-pay | Admitting: Family Medicine

## 2023-03-06 ENCOUNTER — Other Ambulatory Visit: Payer: Self-pay

## 2023-03-06 MED ORDER — ONETOUCH VERIO W/DEVICE KIT: PACK | 0 refills | Status: AC

## 2023-03-06 NOTE — Telephone Encounter (Signed)
Blood sugar machine sent

## 2023-03-06 NOTE — Telephone Encounter (Signed)
[facility] is calling needing an rx for blood sugar reader sent to pharmacy. Please send to [facility] on [address]. Thank you

## 2023-03-09 DIAGNOSIS — E119 Type 2 diabetes mellitus without complications: Secondary | ICD-10-CM | POA: Diagnosis not present

## 2023-03-09 DIAGNOSIS — E7800 Pure hypercholesterolemia, unspecified: Secondary | ICD-10-CM | POA: Diagnosis not present

## 2023-03-09 DIAGNOSIS — E663 Overweight: Secondary | ICD-10-CM | POA: Diagnosis not present

## 2023-03-22 ENCOUNTER — Other Ambulatory Visit: Payer: Self-pay | Admitting: Family Medicine

## 2023-04-10 DIAGNOSIS — Z111 Encounter for screening for respiratory tuberculosis: Secondary | ICD-10-CM | POA: Diagnosis not present

## 2023-04-10 DIAGNOSIS — F200 Paranoid schizophrenia: Secondary | ICD-10-CM | POA: Diagnosis not present

## 2023-04-10 DIAGNOSIS — F418 Other specified anxiety disorders: Secondary | ICD-10-CM | POA: Diagnosis not present

## 2023-04-10 DIAGNOSIS — F331 Major depressive disorder, recurrent, moderate: Secondary | ICD-10-CM | POA: Diagnosis not present

## 2023-04-12 DIAGNOSIS — Z111 Encounter for screening for respiratory tuberculosis: Secondary | ICD-10-CM | POA: Diagnosis not present

## 2023-04-21 ENCOUNTER — Other Ambulatory Visit: Payer: Self-pay | Admitting: Family Medicine

## 2023-04-21 DIAGNOSIS — M25472 Effusion, left ankle: Secondary | ICD-10-CM

## 2023-04-24 ENCOUNTER — Telehealth: Payer: Self-pay | Admitting: Family Medicine

## 2023-04-24 NOTE — Telephone Encounter (Signed)
Pt dropped off paperwork to be filled out by PCP. Pt asked to be called when faxed. Forms placed in PCP's tray in front office.

## 2023-04-24 NOTE — Telephone Encounter (Signed)
Forms placed in providers bin to be sign.

## 2023-04-25 ENCOUNTER — Encounter (INDEPENDENT_AMBULATORY_CARE_PROVIDER_SITE_OTHER): Payer: Self-pay

## 2023-05-10 ENCOUNTER — Encounter (INDEPENDENT_AMBULATORY_CARE_PROVIDER_SITE_OTHER): Payer: Self-pay

## 2023-05-10 DIAGNOSIS — Z23 Encounter for immunization: Secondary | ICD-10-CM | POA: Diagnosis not present

## 2023-05-21 NOTE — Assessment & Plan Note (Signed)
Well controlled, no changes to meds. Encouraged heart healthy diet such as the DASH diet and exercise as tolerated.  °

## 2023-05-21 NOTE — Assessment & Plan Note (Signed)
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

## 2023-05-21 NOTE — Assessment & Plan Note (Signed)
Encourage heart healthy diet such as MIND or DASH diet, increase exercise, avoid trans fats, simple carbohydrates and processed foods, consider a krill or fish or flaxseed oil cap daily.  °

## 2023-05-21 NOTE — Assessment & Plan Note (Signed)
Encouraged increased hydration and fiber in diet. Daily probiotics. If bowels not moving can use MOM 2 tbls po in 4 oz of warm prune juice by mouth every 2-3 days. If no results then repeat in 4 hours with  Dulcolax suppository pr, may repeat again in 4 more hours as needed. Seek care if symptoms worsen. Consider daily Miralax and/or Dulcolax if symptoms persist.  

## 2023-05-21 NOTE — Assessment & Plan Note (Signed)
hgba1c acceptable, minimize simple carbs. Increase exercise as tolerated. Continue current meds 

## 2023-05-21 NOTE — Assessment & Plan Note (Signed)
Doing well at group home

## 2023-05-22 ENCOUNTER — Ambulatory Visit (INDEPENDENT_AMBULATORY_CARE_PROVIDER_SITE_OTHER): Payer: Medicare Other | Admitting: Family Medicine

## 2023-05-22 VITALS — BP 120/68 | HR 96 | Temp 98.0°F | Resp 16 | Ht 73.0 in | Wt 269.6 lb

## 2023-05-22 DIAGNOSIS — Z789 Other specified health status: Secondary | ICD-10-CM

## 2023-05-22 DIAGNOSIS — F419 Anxiety disorder, unspecified: Secondary | ICD-10-CM

## 2023-05-22 DIAGNOSIS — E785 Hyperlipidemia, unspecified: Secondary | ICD-10-CM | POA: Diagnosis not present

## 2023-05-22 DIAGNOSIS — I10 Essential (primary) hypertension: Secondary | ICD-10-CM

## 2023-05-22 DIAGNOSIS — E119 Type 2 diabetes mellitus without complications: Secondary | ICD-10-CM | POA: Diagnosis not present

## 2023-05-22 DIAGNOSIS — K5900 Constipation, unspecified: Secondary | ICD-10-CM | POA: Diagnosis not present

## 2023-05-22 LAB — CBC WITH DIFFERENTIAL/PLATELET
Basophils Absolute: 0 10*3/uL (ref 0.0–0.1)
Basophils Relative: 0.5 % (ref 0.0–3.0)
Eosinophils Absolute: 0.2 10*3/uL (ref 0.0–0.7)
Eosinophils Relative: 1.7 % (ref 0.0–5.0)
HCT: 46.5 % (ref 39.0–52.0)
Hemoglobin: 15.1 g/dL (ref 13.0–17.0)
Lymphocytes Relative: 26 % (ref 12.0–46.0)
Lymphs Abs: 2.4 10*3/uL (ref 0.7–4.0)
MCHC: 32.6 g/dL (ref 30.0–36.0)
MCV: 95.7 fl (ref 78.0–100.0)
Monocytes Absolute: 0.7 10*3/uL (ref 0.1–1.0)
Monocytes Relative: 7.7 % (ref 3.0–12.0)
Neutro Abs: 6 10*3/uL (ref 1.4–7.7)
Neutrophils Relative %: 64.1 % (ref 43.0–77.0)
Platelets: 255 10*3/uL (ref 150.0–400.0)
RBC: 4.86 Mil/uL (ref 4.22–5.81)
RDW: 12.7 % (ref 11.5–15.5)
WBC: 9.3 10*3/uL (ref 4.0–10.5)

## 2023-05-22 LAB — LIPID PANEL
Cholesterol: 104 mg/dL (ref 0–200)
HDL: 45.9 mg/dL (ref >39.00)
LDL Cholesterol: 26 mg/dL (ref 0–99)
NonHDL: 58.51
Total CHOL/HDL Ratio: 2
Triglycerides: 162 mg/dL — ABNORMAL HIGH (ref 0.0–149.0)
VLDL: 32.4 mg/dL (ref 0.0–40.0)

## 2023-05-22 LAB — MICROALBUMIN / CREATININE URINE RATIO
Creatinine,U: 7.9 mg/dL
Microalb Creat Ratio: 8.9 mg/g (ref 0.0–30.0)
Microalb, Ur: <0.7 mg/dL (ref 0.0–1.9)

## 2023-05-22 LAB — TSH: TSH: 1.98 u[IU]/mL (ref 0.35–5.50)

## 2023-05-23 LAB — COMPLETE METABOLIC PANEL WITH GFR
AG Ratio: 2 (calc) (ref 1.0–2.5)
ALT: 33 U/L (ref 9–46)
AST: 27 U/L (ref 10–40)
Albumin: 4.7 g/dL (ref 3.6–5.1)
Alkaline phosphatase (APISO): 65 U/L (ref 36–130)
BUN: 12 mg/dL (ref 7–25)
CO2: 22 mmol/L (ref 20–32)
Calcium: 10.3 mg/dL (ref 8.6–10.3)
Chloride: 101 mmol/L (ref 98–110)
Creat: 0.91 mg/dL (ref 0.60–1.29)
Globulin: 2.4 g/dL (ref 1.9–3.7)
Glucose, Bld: 130 mg/dL — ABNORMAL HIGH (ref 65–99)
Potassium: 4.3 mmol/L (ref 3.5–5.3)
Sodium: 137 mmol/L (ref 135–146)
Total Bilirubin: 0.4 mg/dL (ref 0.2–1.2)
Total Protein: 7.1 g/dL (ref 6.1–8.1)
eGFR: 103 mL/min/{1.73_m2} (ref >=60)

## 2023-05-28 ENCOUNTER — Encounter: Payer: Self-pay | Admitting: Family Medicine

## 2023-05-28 NOTE — Progress Notes (Signed)
Subjective:    Patient ID: Joseph [name], male    DOB: [birth year], 47 y.o.   MRN: [id number]  Chief Complaint  Patient presents with   Annual Exam    Annual Exam    HPI Discussed the use of AI scribe software for clinical note transcription with the patient, who gave verbal consent to proceed.  History of Present Illness   The patient, with a past medical history of vaping and alcohol use, presents for a routine check-up. They report good eating and sleeping habits and deny any recent health concerns. Despite the doctor's concerns about the potential long-term effects on lung health, the patient admits to continuing to vape. They express a willingness to consider reducing their vaping habits. The patient denies any current alcohol use and reports having abstained from alcohol for a significant period. They also report good control of their blood sugar levels under the care of an endocrinologist, Dr. Horald Pollen. The patient is due for a colonoscopy in 2031 and is recommended to receive a COVID booster shot in September.        Past Medical History:  Diagnosis Date   Anemia    post operative   Antiphospholipid syndrome (HCC) 08/15/2011   Anxiety    Blood transfusion without reported diagnosis    x 2 per pt    Cellulitis    bilateral thighs   Cerumen impaction 07/13/2013   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HCC)    Constipation in male    Depression    Diabetes (HCC)    no medications    Elevated BP 10/20/2012   Enlarged thyroid 03/07/2015   H/O tobacco use, presenting hazards to health 12/01/2011   Quit October 2014    Hyperlipidemia    Lupus anticoagulant positive 08/15/2011   Medicare annual wellness visit, subsequent 03/19/2016   Obesity    Schizophrenia (HCC)    Sleep apnea    wears cpap, no 02    Past Surgical History:  Procedure Laterality Date   COLONOSCOPY     eye surgery     b/l laser eye sx 2004   I & D EXTREMITY     left leg   LEG SURGERY     TONSILLECTOMY     WISDOM  TOOTH EXTRACTION      Family History  Problem Relation Age of Onset   Diverticulosis Father    Stomach cancer Mother        stomach CA dx at 44   GER disease Mother    Cancer Mother        stomach   Colon cancer Neg Hx    Pancreatic cancer Neg Hx    Esophageal cancer Neg Hx    Colon polyps Neg Hx    Rectal cancer Neg Hx     Social History   Socioeconomic History   Marital status: Single    Spouse name: Not on file   Number of children: 0   Years of education: Not on file   Highest education level: Not on file  Occupational History    Employer: [employer]    Comment: [employer]  Tobacco Use   Smoking status: Former    Current packs/day: 0.00    Average packs/day: 1 pack/day for 8.8 years (8.8 ttl pk-yrs)    Types: Cigars, Cigarettes    Start date: 07/15/2012    Quit date: 04/25/2021    Years since quitting: 2.0   Smokeless tobacco: Former    Types:  Chew   Tobacco comments:    4 cigars A DAY  Vaping Use   Vaping status: Every Day  Substance and Sexual Activity   Alcohol use: No   Drug use: No   Sexual activity: Not Currently  Other Topics Concern   Not on file  Social History Narrative   Lives in a Group Home - his dad pays for it - Visits with Dad every Saturday and Sunday   Social Determinants of Health   Financial Resource Strain: Low Risk  (06/04/2021)   Overall Financial Resource Strain (CARDIA)    Difficulty of Paying Living Expenses: Not hard at all  Food Insecurity: No Food Insecurity (07/27/2022)   Hunger Vital Sign    Worried About Running Out of Food in the Last Year: Never true    Ran Out of Food in the Last Year: Never true  Transportation Needs: No Transportation Needs (07/27/2022)   PRAPARE - Administrator, Civil Service (Medical): No    Lack of Transportation (Non-Medical): No  Physical Activity: Sufficiently Active (06/04/2021)   Exercise Vital Sign    Days of Exercise per Week: 7 days    Minutes of Exercise per Session:  60 min  Stress: No Stress Concern Present (06/04/2021)   [employer] of Occupational Health - Occupational Stress Questionnaire    Feeling of Stress : Not at all  Social Connections: Unknown (01/24/2022)   Received from [facility]   Social Network    Social Network: Not on file  Intimate Partner Violence: Not At Risk (07/27/2022)   Humiliation, Afraid, Rape, and Kick questionnaire    Fear of Current or Ex-Partner: No    Emotionally Abused: No    Physically Abused: No    Sexually Abused: No    Outpatient Medications Prior to Visit  Medication Sig Dispense Refill   acetaminophen (TYLENOL) 325 MG tablet 1 tab po every 8 hours as needed for pain 30 tablet 0   aspirin EC (ASPIRIN LOW DOSE) 81 MG tablet Take 1 tablet (81 mg total) by mouth daily. 30 tablet 5   Blood Glucose Monitoring Suppl (ONETOUCH VERIO) w/Device KIT Blood Glucose Monitoring Check once daily 1 kit 0   clonazePAM (KLONOPIN) 1 MG tablet Take 1 mg by mouth 2 (two) times daily.     clonazePAM (KLONOPIN) 1 MG tablet Take 1 tablet (1 mg total) by mouth 2 (two) times daily. 60 tablet 0   divalproex (DEPAKOTE ER) 500 MG 24 hr tablet Take 1,000 mg by mouth at bedtime.     docusate sodium (COLACE) 100 MG capsule TAKE 1 CAPSULE(100 MG) BY MOUTH TWICE DAILY 60 capsule 11   FLUoxetine (PROZAC) 20 MG tablet Take 20 mg by mouth at bedtime.     Lancets (ONETOUCH DELICA PLUS LANCET30G) MISC 1 each by Other route daily. Glucose 90 each 3   lisinopril (ZESTRIL) 2.5 MG tablet TAKE ONE TABLET BY MOUTH DAILY (Patient taking differently: Take 2.5 mg by mouth daily.) 90 tablet 3   loratadine (CLARITIN) 10 MG tablet Take 1 tablet (10 mg total) by mouth daily. 90 tablet 0   meloxicam (MOBIC) 15 MG tablet TAKE 1/2 TO 1 TABLET BY MOUTH EVERY DAY AS NEEDED 30 tablet 1   Multiple Vitamin (MULTIVITAMIN) TABS Take 1 tablet by mouth daily. 90 tablet 1   OLANZapine (ZYPREXA) 2.5 MG tablet Take 2.5 mg by mouth daily as needed.      ONETOUCH VERIO test strip 1 each by Other  route in the morning and at bedtime. 100 each 12   polyethylene glycol powder (GLYCOLAX/MIRALAX) 17 GM/SCOOP powder DISSOLVE 17 GRAMS IN LIQUID AND DRINK BY MOUTH DAILY 238 g 5   rosuvastatin (CRESTOR) 10 MG tablet Take 10 mg by mouth daily.     SYNJARDY 12.01-999 MG TABS Take 1 tablet by mouth 2 (two) times daily.     Wheat Dextrin (CLEAR SOLUBLE FIBER) POWD Take 1 Dose by mouth in the morning and at bedtime. 477 g 11   ezetimibe (ZETIA) 10 MG tablet Take 1 tablet (10 mg total) by mouth daily. 90 tablet 1   No facility-administered medications prior to visit.    Allergies  Allergen Reactions   Haloperidol Other (See Comments)   Statins Other (See Comments)    ? Related to compartment syndrome   Aripiprazole Other (See Comments)   Haldol [Haloperidol Decanoate]     Painful, HA   Risperidone And Related     Risperdol per pt     Review of Systems  Constitutional:  Negative for chills, fever and malaise/fatigue.  HENT:  Negative for congestion and hearing loss.   Eyes:  Negative for discharge.  Respiratory:  Negative for cough, sputum production and shortness of breath.   Cardiovascular:  Negative for chest pain, palpitations and leg swelling.  Gastrointestinal:  Negative for abdominal pain, blood in stool, constipation, diarrhea, heartburn, nausea and vomiting.  Genitourinary:  Negative for dysuria, frequency, hematuria and urgency.  Musculoskeletal:  Negative for back pain, falls and myalgias.  Skin:  Negative for rash.  Neurological:  Negative for dizziness, sensory change, loss of consciousness, weakness and headaches.  Endo/Heme/Allergies:  Negative for environmental allergies. Does not bruise/bleed easily.  Psychiatric/Behavioral:  Negative for depression and suicidal ideas. The patient is not nervous/anxious and does not have insomnia.        Objective:    Physical Exam Vitals reviewed.  Constitutional:      General: He is not  in acute distress.    Appearance: Normal appearance. He is not ill-appearing or diaphoretic.  HENT:     Head: Normocephalic and atraumatic.     Right Ear: Tympanic membrane, ear canal and external ear normal. There is no impacted cerumen.     Left Ear: Tympanic membrane, ear canal and external ear normal. There is no impacted cerumen.     Nose: Nose normal. No rhinorrhea.     Mouth/Throat:     Pharynx: Oropharynx is clear.  Eyes:     General: No scleral icterus.    Extraocular Movements: Extraocular movements intact.     Conjunctiva/sclera: Conjunctivae normal.     Pupils: Pupils are equal, round, and reactive to light.  Neck:     Thyroid: No thyroid mass or thyroid tenderness.  Cardiovascular:     Rate and Rhythm: Normal rate and regular rhythm.     Pulses: Normal pulses.     Heart sounds: Normal heart sounds. No murmur heard. Pulmonary:     Effort: Pulmonary effort is normal.     Breath sounds: Normal breath sounds. No wheezing.  Abdominal:     General: Bowel sounds are normal.     Palpations: Abdomen is soft. There is no mass.     Tenderness: There is no abdominal tenderness. There is no guarding.  Musculoskeletal:        General: No swelling. Normal range of motion.     Cervical back: Normal range of motion and neck supple. No rigidity.  Right lower leg: No edema.     Left lower leg: No edema.  Lymphadenopathy:     Cervical: No cervical adenopathy.  Skin:    General: Skin is warm and dry.     Findings: No rash.  Neurological:     General: No focal deficit present.     Mental Status: He is alert and oriented to person, place, and time.     Cranial Nerves: No cranial nerve deficit.     Deep Tendon Reflexes: Reflexes normal.  Psychiatric:        Mood and Affect: Mood normal.        Behavior: Behavior normal.     BP 120/68 (BP Location: Left Arm, Patient Position: Sitting, Cuff Size: Normal)   Pulse 96   Temp 98 F (36.7 C) (Oral)   Resp 16   Ht 6\' 1"  (1.854 m)    Wt 269 lb 9.6 oz (122.3 kg)   SpO2 100%   BMI 35.57 kg/m  Wt Readings from Last 3 Encounters:  05/22/23 269 lb 9.6 oz (122.3 kg)  02/22/23 272 lb 3.2 oz (123.5 kg)  11/23/22 275 lb 12.8 oz (125.1 kg)    Diabetic Foot Exam - Simple   No data filed    Lab Results  Component Value Date   WBC 9.3 05/22/2023   HGB 15.1 05/22/2023   HCT 46.5 05/22/2023   PLT 255.0 05/22/2023   GLUCOSE 130 (H) 05/22/2023   CHOL 104 05/22/2023   TRIG 162.0 (H) 05/22/2023   HDL 45.90 05/22/2023   LDLDIRECT 42.0 11/24/2021   LDLCALC 26 05/22/2023   ALT 33 05/22/2023   AST 27 05/22/2023   NA 137 05/22/2023   K 4.3 05/22/2023   CL 101 05/22/2023   CREATININE 0.91 05/22/2023   BUN 12 05/22/2023   CO2 22 05/22/2023   TSH 1.98 05/22/2023   PSA 0.31 08/05/2019   INR 1.0 07/15/2022   HGBA1C 6.6 (H) 07/15/2022   MICROALBUR <0.7 05/22/2023    Lab Results  Component Value Date   TSH 1.98 05/22/2023   Lab Results  Component Value Date   WBC 9.3 05/22/2023   HGB 15.1 05/22/2023   HCT 46.5 05/22/2023   MCV 95.7 05/22/2023   PLT 255.0 05/22/2023   Lab Results  Component Value Date   NA 137 05/22/2023   K 4.3 05/22/2023   CO2 22 05/22/2023   GLUCOSE 130 (H) 05/22/2023   BUN 12 05/22/2023   CREATININE 0.91 05/22/2023   BILITOT 0.4 05/22/2023   ALKPHOS 59 09/07/2022   AST 27 05/22/2023   ALT 33 05/22/2023   PROT 7.1 05/22/2023   ALBUMIN 4.2 09/07/2022   CALCIUM 10.3 05/22/2023   ANIONGAP 8 09/07/2022   EGFR 103 05/22/2023   GFR 85.89 08/08/2022   Lab Results  Component Value Date   CHOL 104 05/22/2023   Lab Results  Component Value Date   HDL 45.90 05/22/2023   Lab Results  Component Value Date   LDLCALC 26 05/22/2023   Lab Results  Component Value Date   TRIG 162.0 (H) 05/22/2023   Lab Results  Component Value Date   CHOLHDL 2 05/22/2023   Lab Results  Component Value Date   HGBA1C 6.6 (H) 07/15/2022       Assessment & Plan:  Anxiety Assessment &  Plan: Doing well at group home   Constipation, unspecified constipation type Assessment & Plan: Encouraged increased hydration and fiber in diet. Daily probiotics. If bowels not moving can  use MOM 2 tbls po in 4 oz of warm prune juice by mouth every 2-3 days. If no results then repeat in 4 hours with  Dulcolax suppository pr, may repeat again in 4 more hours as needed. Seek care if symptoms worsen. Consider daily Miralax and/or Dulcolax if symptoms persist.    Orders: -     CBC with Differential/Platelet  Hypertension, unspecified type Assessment & Plan: Well controlled, no changes to meds. Encouraged heart healthy diet such as the DASH diet and exercise as tolerated.     Hyperlipidemia, mild Assessment & Plan: Encourage heart healthy diet such as MIND or DASH diet, increase exercise, avoid trans fats, simple carbohydrates and processed foods, consider a krill or fish or flaxseed oil cap daily.   Orders: -     Lipid panel  Statin intolerance Assessment & Plan: Suffered compartment syndrome on statin  Orders: -     TSH  Type 2 diabetes mellitus without complication, unspecified whether long term insulin use (HCC) Assessment & Plan: hgba1c acceptable, minimize simple carbs. Increase exercise as tolerated. Continue current meds  Orders: -     TSH -     Microalbumin / creatinine urine ratio -     COMPLETE METABOLIC PANEL WITH eGFR    Assessment and Plan    Vaping Continued use despite potential long-term risks to lung health. Discussed the addictive nature of nicotine and the potential unknown risks of vaping. -Encouraged to gradually reduce vaping frequency.  Alcohol Use History of alcohol use, currently abstinent. -Encouraged to continue abstinence for optimal health and longevity.  Diabetes Managed by endocrinologist Dr. Lurene Shadow, recent readings reported as good. -Obtain last two clinic notes and last two hemoglobin A1cs from Dr. Lurene Shadow.  General Health  Maintenance -Order standard labs including cholesterol, kidney, liver panel, thyroid, and blood count. -Collect urine sample for routine analysis. -Recommended COVID booster in September at pharmacy. -Plan for shingles vaccination at next visit (age 40). -Schedule follow-up visit in six months.  Advanced Care Planning -Reach out to patient's father and sister to obtain existing healthcare power of attorney documents.  Colon Health Last colonoscopy in 2021 with recommendation for next in 2031. -No further action needed at this time.  Prostate Health No current urinary symptoms. -Plan to check PSA at next visit (age 38).         Danise Edge, MD

## 2023-06-08 ENCOUNTER — Ambulatory Visit (INDEPENDENT_AMBULATORY_CARE_PROVIDER_SITE_OTHER): Payer: Medicare Other | Admitting: *Deleted

## 2023-06-08 VITALS — BP 110/71 | HR 84 | Ht 73.0 in | Wt 267.8 lb

## 2023-06-08 DIAGNOSIS — Z0000 Encounter for general adult medical examination without abnormal findings: Secondary | ICD-10-CM

## 2023-06-08 NOTE — Patient Instructions (Signed)
[name] , Thank you for taking time to come for your Medicare Wellness Visit. I appreciate your ongoing commitment to your health goals. Please review the following plan we discussed and let me know if I can assist you in the future.      This is a list of the screening recommended for you and due dates:  Health Maintenance  Topic Date Due   Eye exam for diabetics  10/05/2018   Complete foot exam   11/25/2022   Hemoglobin A1C  01/14/2023   COVID-19 Vaccine (4 - 2023-24 season) 05/27/2023   Yearly kidney function blood test for diabetes  05/21/2024   Yearly kidney health urinalysis for diabetes  05/21/2024   Medicare Annual Wellness Visit  06/07/2024   DTaP/Tdap/Td vaccine (2 - Td or Tdap) 03/21/2028   Colon Cancer Screening  06/07/2030   Flu Shot  Completed   Hepatitis C Screening  Completed   HIV Screening  Completed   HPV Vaccine  Aged Out    Next appointment: Follow up in one year for your annual wellness visit.  Preventive Care 40-64 Years, Male Preventive care refers to lifestyle choices and visits with your health care provider that can promote health and wellness. What does preventive care include? A yearly physical exam. This is also called an annual well check. Dental exams once or twice a year. Routine eye exams. Ask your health care provider how often you should have your eyes checked. Personal lifestyle choices, including: Daily care of your teeth and gums. Regular physical activity. Eating a healthy diet. Avoiding tobacco and drug use. Limiting alcohol use. Practicing safe sex. Taking low-dose aspirin every day starting at age 17. What happens during an annual well check? The services and screenings done by your health care provider during your annual well check will depend on your age, overall health, lifestyle risk factors, and family history of disease. Counseling  Your health care provider may ask you questions about your: Alcohol use. Tobacco  use. Drug use. Emotional well-being. Home and relationship well-being. Sexual activity. Eating habits. Work and work Astronomer. Screening  You may have the following tests or measurements: Height, weight, and BMI. Blood pressure. Lipid and cholesterol levels. These may be checked every 5 years, or more frequently if you are over 77 years old. Skin check. Lung cancer screening. You may have this screening every year starting at age 70 if you have a 30-pack-year history of smoking and currently smoke or have quit within the past 15 years. Fecal occult blood test (FOBT) of the stool. You may have this test every year starting at age 73. Flexible sigmoidoscopy or colonoscopy. You may have a sigmoidoscopy every 5 years or a colonoscopy every 10 years starting at age 24. Prostate cancer screening. Recommendations will vary depending on your family history and other risks. Hepatitis C blood test. Hepatitis B blood test. Sexually transmitted disease (STD) testing. Diabetes screening. This is done by checking your blood sugar (glucose) after you have not eaten for a while (fasting). You may have this done every 1-3 years. Discuss your test results, treatment options, and if necessary, the need for more tests with your health care provider. Vaccines  Your health care provider may recommend certain vaccines, such as: Influenza vaccine. This is recommended every year. Tetanus, diphtheria, and acellular pertussis (Tdap, Td) vaccine. You may need a Td booster every 10 years. Zoster vaccine. You may need this after age 50. Pneumococcal 13-valent conjugate (PCV13) vaccine. You may need this  if you have certain conditions and have not been vaccinated. Pneumococcal polysaccharide (PPSV23) vaccine. You may need one or two doses if you smoke cigarettes or if you have certain conditions. Talk to your health care provider about which screenings and vaccines you need and how often you need them. This  information is not intended to replace advice given to you by your health care provider. Make sure you discuss any questions you have with your health care provider. Document Released: 10/08/2015 Document Revised: 05/31/2016 Document Reviewed: 07/13/2015 Elsevier Interactive Patient Education  2017 [employer].  Fall Prevention in the Home Falls can cause injuries. They can happen to people of all ages. There are many things you can do to make your home safe and to help prevent falls. What can I do on the outside of my home? Regularly fix the edges of walkways and driveways and fix any cracks. Remove anything that might make you trip as you walk through a door, such as a raised step or threshold. Trim any bushes or trees on the path to your home. Use bright outdoor lighting. Clear any walking paths of anything that might make someone trip, such as rocks or tools. Regularly check to see if handrails are loose or broken. Make sure that both sides of any steps have handrails. Any raised decks and porches should have guardrails on the edges. Have any leaves, snow, or ice cleared regularly. Use sand or salt on walking paths during winter. Clean up any spills in your garage right away. This includes oil or grease spills. What can I do in the bathroom? Use night lights. Install grab bars by the toilet and in the tub and shower. Do not use towel bars as grab bars. Use non-skid mats or decals in the tub or shower. If you need to sit down in the shower, use a plastic, non-slip stool. Keep the floor dry. Clean up any water that spills on the floor as soon as it happens. Remove soap buildup in the tub or shower regularly. Attach bath mats securely with double-sided non-slip rug tape. Do not have throw rugs and other things on the floor that can make you trip. What can I do in the bedroom? Use night lights. Make sure that you have a light by your bed that is easy to reach. Do not use any sheets or  blankets that are too big for your bed. They should not hang down onto the floor. Have a firm chair that has side arms. You can use this for support while you get dressed. Do not have throw rugs and other things on the floor that can make you trip. What can I do in the kitchen? Clean up any spills right away. Avoid walking on wet floors. Keep items that you use a lot in easy-to-reach places. If you need to reach something above you, use a strong step stool that has a grab bar. Keep electrical cords out of the way. Do not use floor polish or wax that makes floors slippery. If you must use wax, use non-skid floor wax. Do not have throw rugs and other things on the floor that can make you trip. What can I do with my stairs? Do not leave any items on the stairs. Make sure that there are handrails on both sides of the stairs and use them. Fix handrails that are broken or loose. Make sure that handrails are as long as the stairways. Check any carpeting to make sure that it  is firmly attached to the stairs. Fix any carpet that is loose or worn. Avoid having throw rugs at the top or bottom of the stairs. If you do have throw rugs, attach them to the floor with carpet tape. Make sure that you have a light switch at the top of the stairs and the bottom of the stairs. If you do not have them, ask someone to add them for you. What else can I do to help prevent falls? Wear shoes that: Do not have high heels. Have rubber bottoms. Are comfortable and fit you well. Are closed at the toe. Do not wear sandals. If you use a stepladder: Make sure that it is fully opened. Do not climb a closed stepladder. Make sure that both sides of the stepladder are locked into place. Ask someone to hold it for you, if possible. Clearly mark and make sure that you can see: Any grab bars or handrails. First and last steps. Where the edge of each step is. Use tools that help you move around (mobility aids) if they are  needed. These include: Canes. Walkers. Scooters. Crutches. Turn on the lights when you go into a dark area. Replace any light bulbs as soon as they burn out. Set up your furniture so you have a clear path. Avoid moving your furniture around. If any of your floors are uneven, fix them. If there are any pets around you, be aware of where they are. Review your medicines with your doctor. Some medicines can make you feel dizzy. This can increase your chance of falling. Ask your doctor what other things that you can do to help prevent falls. This information is not intended to replace advice given to you by your health care provider. Make sure you discuss any questions you have with your health care provider. Document Released: 07/08/2009 Document Revised: 02/17/2016 Document Reviewed: 10/16/2014 Elsevier Interactive Patient Education  2017 [employer].

## 2023-06-08 NOTE — Progress Notes (Signed)
Subjective:   Joseph [name] is a 47 y.o. male who presents for Medicare Annual/Subsequent preventive examination.  Visit Complete: In person   Cardiac Risk Factors include: diabetes mellitus;dyslipidemia;obesity (BMI >30kg/m2);male gender;hypertension     Objective:    Today's Vitals   06/08/23 1106  BP: 110/71  Pulse: 84  Weight: 267 lb 12.8 oz (121.5 kg)  Height: 6\' 1"  (1.854 m)   Body mass index is 35.33 kg/m.     06/08/2023   11:11 AM 09/07/2022    2:23 PM 07/27/2022    5:12 PM 07/27/2022    3:56 PM 07/14/2022    3:42 AM 06/06/2022   11:05 AM 06/04/2021   12:16 PM  Advanced Directives  Does Patient Have a Medical Advance Directive? No No  No No Yes Yes  Type of Engineer, drilling of [place];Living will  Copy of Healthcare Power of Attorney in Chart?      No - copy requested No - copy requested  Would patient like information on creating a medical advance directive? No - Patient declined No - Patient declined No - Patient declined  No - Patient declined      Current Medications (verified) Outpatient Encounter Medications as of 06/08/2023  Medication Sig   acetaminophen (TYLENOL) 325 MG tablet 1 tab po every 8 hours as needed for pain   aspirin EC (ASPIRIN LOW DOSE) 81 MG tablet Take 1 tablet (81 mg total) by mouth daily.   Blood Glucose Monitoring Suppl (ONETOUCH VERIO) w/Device KIT Blood Glucose Monitoring Check once daily   clonazePAM (KLONOPIN) 1 MG tablet Take 1 mg by mouth 2 (two) times daily.   clonazePAM (KLONOPIN) 1 MG tablet Take 1 tablet (1 mg total) by mouth 2 (two) times daily.   divalproex (DEPAKOTE ER) 500 MG 24 hr tablet Take 1,000 mg by mouth at bedtime.   docusate sodium (COLACE) 100 MG capsule TAKE 1 CAPSULE(100 MG) BY MOUTH TWICE DAILY   ezetimibe (ZETIA) 10 MG tablet Take 1 tablet (10 mg total) by mouth daily.   FLUoxetine (PROZAC) 20 MG tablet Take 20 mg by mouth at bedtime.   Lancets  (ONETOUCH DELICA PLUS LANCET30G) MISC 1 each by Other route daily. Glucose   lisinopril (ZESTRIL) 2.5 MG tablet TAKE ONE TABLET BY MOUTH DAILY (Patient taking differently: Take 2.5 mg by mouth daily.)   loratadine (CLARITIN) 10 MG tablet Take 1 tablet (10 mg total) by mouth daily.   meloxicam (MOBIC) 15 MG tablet TAKE 1/2 TO 1 TABLET BY MOUTH EVERY DAY AS NEEDED   Multiple Vitamin (MULTIVITAMIN) TABS Take 1 tablet by mouth daily.   OLANZapine (ZYPREXA) 2.5 MG tablet Take 2.5 mg by mouth daily as needed.   ONETOUCH VERIO test strip 1 each by Other route in the morning and at bedtime.   polyethylene glycol powder (GLYCOLAX/MIRALAX) 17 GM/SCOOP powder DISSOLVE 17 GRAMS IN LIQUID AND DRINK BY MOUTH DAILY   rosuvastatin (CRESTOR) 10 MG tablet Take 10 mg by mouth daily.   SYNJARDY 12.01-999 MG TABS Take 1 tablet by mouth 2 (two) times daily.   Wheat Dextrin (CLEAR SOLUBLE FIBER) POWD Take 1 Dose by mouth in the morning and at bedtime.   No facility-administered encounter medications on file as of 06/08/2023.    Allergies (verified) Haloperidol, Statins, Aripiprazole, Haldol [haloperidol decanoate], and Risperidone and related   History: Past Medical History:  Diagnosis Date   Anemia    post operative  Antiphospholipid syndrome (HCC) 08/15/2011   Anxiety    Blood transfusion without reported diagnosis    x 2 per pt    Cellulitis    bilateral thighs   Cerumen impaction 07/13/2013   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HCC)    Constipation in male    Depression    Diabetes (HCC)    no medications    Elevated BP 10/20/2012   Enlarged thyroid 03/07/2015   H/O tobacco use, presenting hazards to health 12/01/2011   Quit October 2014    Hyperlipidemia    Lupus anticoagulant positive 08/15/2011   Medicare annual wellness visit, subsequent 03/19/2016   Obesity    Schizophrenia (HCC)    Sleep apnea    wears cpap, no 02   Past Surgical History:  Procedure Laterality Date   COLONOSCOPY     eye surgery      b/l laser eye sx 2004   I & D EXTREMITY     left leg   LEG SURGERY     TONSILLECTOMY     WISDOM TOOTH EXTRACTION     Family History  Problem Relation Age of Onset   Diverticulosis Father    Stomach cancer Mother        stomach CA dx at 40   GER disease Mother    Cancer Mother        stomach   Colon cancer Neg Hx    Pancreatic cancer Neg Hx    Esophageal cancer Neg Hx    Colon polyps Neg Hx    Rectal cancer Neg Hx    Social History   Socioeconomic History   Marital status: Single    Spouse name: Not on file   Number of children: 0   Years of education: Not on file   Highest education level: Not on file  Occupational History    Employer: [employer]    Comment: [employer]  Tobacco Use   Smoking status: Former    Current packs/day: 0.00    Average packs/day: 1 pack/day for 8.8 years (8.8 ttl pk-yrs)    Types: Cigars, Cigarettes    Start date: 07/15/2012    Quit date: 04/25/2021    Years since quitting: 2.1   Smokeless tobacco: Former    Types: Chew   Tobacco comments:    4 cigars A DAY        Now vaping  Vaping Use   Vaping status: Every Day  Substance and Sexual Activity   Alcohol use: No   Drug use: No   Sexual activity: Not Currently  Other Topics Concern   Not on file  Social History Narrative   Lives in a Group Home - his dad pays for it - Visits with Dad every Saturday and Sunday   Social Determinants of Health   Financial Resource Strain: Low Risk  (06/08/2023)   Overall Financial Resource Strain (CARDIA)    Difficulty of Paying Living Expenses: Not hard at all  Food Insecurity: No Food Insecurity (06/08/2023)   Hunger Vital Sign    Worried About Running Out of Food in the Last Year: Never true    Ran Out of Food in the Last Year: Never true  Transportation Needs: No Transportation Needs (06/08/2023)   PRAPARE - Administrator, Civil Service (Medical): No    Lack of Transportation (Non-Medical): No  Physical Activity:  Sufficiently Active (06/08/2023)   Exercise Vital Sign    Days of Exercise per Week: 7 days  Minutes of Exercise per Session: 40 min  Stress: No Stress Concern Present (06/08/2023)   [employer] of Occupational Health - Occupational Stress Questionnaire    Feeling of Stress : Not at all  Social Connections: Moderately Isolated (06/08/2023)   Social Connection and Isolation Panel [NHANES]    Frequency of Communication with Friends and Family: More than three times a week    Frequency of Social Gatherings with Friends and Family: Three times a week    Attends Religious Services: More than 4 times per year    Active Member of Clubs or Organizations: No    Attends Banker Meetings: Never    Marital Status: Never married    Tobacco Counseling Counseling given: Not Answered Tobacco comments: 4 cigars A DAY  Now vaping   Clinical Intake:  Pre-visit preparation completed: Yes  Pain : No/denies pain  BMI - recorded: 35.33 Nutritional Status: BMI > 30  Obese Nutritional Risks: None Diabetes: Yes CBG done?: No Did pt. bring in CBG monitor from home?: No  How often do you need to have someone help you when you read instructions, pamphlets, or other written materials from your doctor or pharmacy?: 2 - Rarely  Interpreter Needed?: No  Information entered by :: Donne Anon, CMA   Activities of Daily Living    06/08/2023   11:08 AM 07/27/2022    4:01 PM  In your present state of health, do you have any difficulty performing the following activities:  Hearing? 0   Vision? 0   Difficulty concentrating or making decisions? 0   Walking or climbing stairs? 0   Dressing or bathing? 0   Doing errands, shopping? 1 0  Comment  pt states he drives  Preparing Food and eating ? N   Using the Toilet? N   In the past six months, have you accidently leaked urine? N   Do you have problems with loss of bowel control? N   Managing your Medications? Y   Managing your  Finances? Y   Housekeeping or managing your Housekeeping? N     Patient Care Team: Bradd Canary, MD as PCP - General (Family Medicine) Dorisann Frames, MD as Consulting Physician (Endocrinology) Ellis Savage, NP as Nurse Practitioner Hilarie Fredrickson, MD as Consulting Physician (Gastroenterology) Lilian Kapur Rachelle Hora, DPM as Consulting Physician (Podiatry)  Indicate any recent Medical Services you may have received from other than [facility] providers in the past year (date may be approximate).     Assessment:   This is a routine wellness examination for [facility].  Hearing/Vision screen No results found.   Goals Addressed   None    Depression Screen    06/08/2023   11:14 AM 05/22/2023   11:28 AM 11/23/2022   11:10 AM 07/25/2022   11:43 AM 06/06/2022   11:06 AM 11/24/2021    2:12 PM 06/04/2021   12:17 PM  PHQ 2/9 Scores  PHQ - 2 Score 0 0 0 2 0 0 0  PHQ- 9 Score  0 0 11  0     Fall Risk    06/08/2023   11:11 AM 05/22/2023   11:28 AM 11/23/2022   11:10 AM 07/25/2022   11:42 AM 06/06/2022   11:05 AM  Fall Risk   Falls in the past year? 0 0 0 1 0  Number falls in past yr: 0 0 0 0 1  Injury with Fall? 0 0 0 0 0  Risk for fall due to : No  Fall Risks    No Fall Risks  Follow up Falls evaluation completed Falls evaluation completed Falls evaluation completed Falls evaluation completed Falls evaluation completed    MEDICARE RISK AT HOME: Medicare Risk at Home Any stairs in or around the home?: Yes If so, are there any without handrails?: No Home free of loose throw rugs in walkways, pet beds, electrical cords, etc?: Yes Adequate lighting in your home to reduce risk of falls?: Yes Life alert?: No Use of a cane, walker or w/c?: No Grab bars in the bathroom?: Yes Shower chair or bench in shower?: No Elevated toilet seat or a handicapped toilet?: No  TIMED UP AND GO:  Was the test performed?  Yes  Length of time to ambulate 10 feet: 6 sec Gait steady and fast without use of  assistive device    Cognitive Function:    05/25/2022    1:23 PM 03/19/2018   10:39 AM  MMSE - Mini Mental State Exam  Not completed:  Refused  Orientation to time 3   Orientation to Place 4   Registration 3   Attention/ Calculation 5   Recall 2   Language- name 2 objects 2   Language- repeat 1   Language- follow 3 step command 3   Language- read & follow direction 1   Write a sentence 1   Copy design 1   Total score 26         06/08/2023   11:15 AM 06/06/2022   11:10 AM  6CIT Screen  What Year? 0 points 0 points  What month? 0 points 3 points  What time? 0 points 0 points  Count back from 20 0 points 0 points  Months in reverse 0 points 4 points  Repeat phrase 10 points 6 points  Total Score 10 points 13 points    Immunizations Immunization History  Administered Date(s) Administered   Influenza Split 06/15/2017, 06/07/2019, 05/16/2020   Influenza Whole 06/01/2013   Influenza,inj,Quad PF,6+ Mos 06/09/2014, 06/15/2017, 05/17/2018, 06/06/2022, 05/25/2023   Influenza-Unspecified 05/11/2015, 06/07/2016, 07/05/2021   Moderna Sars-Covid-2 Vaccination 11/17/2019, 12/16/2019, 09/08/2020   PPD Test 01/04/2012, 04/10/2023   Pneumococcal Polysaccharide-23 11/04/2011   Tdap 03/21/2018    TDAP status: Up to date  Flu Vaccine status: Up to date  Pneumococcal vaccine status: Up to date  Covid-19 vaccine status: Information provided on how to obtain vaccines.   Qualifies for Shingles Vaccine? No    Screening Tests Health Maintenance  Topic Date Due   OPHTHALMOLOGY EXAM  10/05/2018   FOOT EXAM  11/25/2022   HEMOGLOBIN A1C  01/14/2023   COVID-19 Vaccine (4 - 2023-24 season) 05/27/2023   Medicare Annual Wellness (AWV)  06/07/2023   Diabetic kidney evaluation - eGFR measurement  05/21/2024   Diabetic kidney evaluation - Urine ACR  05/21/2024   DTaP/Tdap/Td (2 - Td or Tdap) 03/21/2028   Colonoscopy  06/07/2030   INFLUENZA VACCINE  Completed   Hepatitis C Screening   Completed   HIV Screening  Completed   HPV VACCINES  Aged Out    Health Maintenance  Health Maintenance Due  Topic Date Due   OPHTHALMOLOGY EXAM  10/05/2018   FOOT EXAM  11/25/2022   HEMOGLOBIN A1C  01/14/2023   COVID-19 Vaccine (4 - 2023-24 season) 05/27/2023   Medicare Annual Wellness (AWV)  06/07/2023    Colorectal cancer screening: Type of screening: Colonoscopy. Completed 06/07/20. Repeat every 10 years  Lung Cancer Screening: (Low Dose CT Chest recommended if Age 60-80 years, 45  pack-year currently smoking OR have quit w/in 15years.) does not qualify.   Additional Screening:  Hepatitis C Screening: does qualify; Completed 03/16/11  Vision Screening: Recommended annual ophthalmology exams for early detection of glaucoma and other disorders of the eye. Is the patient up to date with their annual eye exam?  Yes  Who is the provider or what is the name of the office in which the patient attends annual eye exams? [facility] If pt is not established with a provider, would they like to be referred to a provider to establish care? No .   Dental Screening: Recommended annual dental exams for proper oral hygiene  Diabetic Foot Exam: Diabetic Foot Exam: Overdue, Pt has been advised about the importance in completing this exam. Pt is scheduled for diabetic foot exam on n/a.  Community Resource Referral / Chronic Care Management: CRR required this visit?  No   CCM required this visit?  No     Plan:     I have personally reviewed and noted the following in the patient's chart:   Medical and social history Use of alcohol, tobacco or illicit drugs  Current medications and supplements including opioid prescriptions. Patient is not currently taking opioid prescriptions. Functional ability and status Nutritional status Physical activity Advanced directives List of other physicians Hospitalizations, surgeries, and ER visits in previous 12 months Vitals Screenings to include  cognitive, depression, and falls Referrals and appointments  In addition, I have reviewed and discussed with patient certain preventive protocols, quality metrics, and best practice recommendations. A written personalized care plan for preventive services as well as general preventive health recommendations were provided to patient.     Donne Anon, CMA   06/08/2023   After Visit Summary: sent to mychart  Nurse Notes: None

## 2023-06-20 ENCOUNTER — Other Ambulatory Visit: Payer: Self-pay | Admitting: Family Medicine

## 2023-06-20 DIAGNOSIS — M25472 Effusion, left ankle: Secondary | ICD-10-CM

## 2023-06-21 ENCOUNTER — Other Ambulatory Visit: Payer: Self-pay | Admitting: Family Medicine

## 2023-06-26 ENCOUNTER — Other Ambulatory Visit: Payer: Self-pay | Admitting: Family Medicine

## 2023-06-26 DIAGNOSIS — E785 Hyperlipidemia, unspecified: Secondary | ICD-10-CM

## 2023-06-28 DIAGNOSIS — F200 Paranoid schizophrenia: Secondary | ICD-10-CM | POA: Diagnosis not present

## 2023-06-28 DIAGNOSIS — F418 Other specified anxiety disorders: Secondary | ICD-10-CM | POA: Diagnosis not present

## 2023-06-28 DIAGNOSIS — F331 Major depressive disorder, recurrent, moderate: Secondary | ICD-10-CM | POA: Diagnosis not present

## 2023-07-18 ENCOUNTER — Telehealth: Payer: Self-pay | Admitting: Family Medicine

## 2023-07-18 NOTE — Telephone Encounter (Signed)
Kaitlyn (Group Home Director) called to request a copy of pts last CPE and CPE labs to be sent to them. Not sure if this can be facilitated as she is not on pt DPR but there is one Janan [place] surgeon) who is. Please Advise. Info can be sent to the following fax number:  F: [phone]

## 2023-07-18 NOTE — Telephone Encounter (Signed)
Patients CPE and labs have been faxed to group home

## 2023-07-27 ENCOUNTER — Telehealth: Payer: Self-pay | Admitting: Family Medicine

## 2023-07-27 MED ORDER — LORATADINE 10 MG PO TABS: ORAL_TABLET | ORAL | 0 refills | Status: DC

## 2023-07-27 MED ORDER — POLYETHYLENE GLYCOL 3350 17 GM/SCOOP PO POWD: ORAL | 5 refills | Status: DC

## 2023-07-27 NOTE — Telephone Encounter (Signed)
Prescription Request  07/27/2023  Is this a "Controlled Substance" medicine? No  LOV: 05/22/2023  What is the name of the medication or equipment?   loratadine (CLARITIN) 10 MG tablet   polyethylene glycol powder (GLYCOLAX/MIRALAX) 17 GM/SCOOP powder  Have you contacted your pharmacy to request a refill? No   Which pharmacy would you like this sent to?  [id number] - [place], [place] - [address] AT [facility] OF [address] [place] [address] Phone: [phone] Fax: [phone]    Patient notified that their request is being sent to the clinical staff for review and that they should receive a response within 2 business days.   Please advise at Mobile [phone] (mobile)

## 2023-07-27 NOTE — Telephone Encounter (Signed)
Rx sent 

## 2023-07-27 NOTE — Addendum Note (Signed)
Addended by: Marian Sorrow D on: 07/27/2023 02:00 PM   Modules accepted: Orders

## 2023-08-27 ENCOUNTER — Telehealth: Payer: Self-pay | Admitting: Family Medicine

## 2023-08-27 NOTE — Telephone Encounter (Signed)
Patient's father called to see if the patient is already on a weight loss medication and if not, can be put on one. Pt's father said he believes he should be on the DPR ( he is listed on the back page of DPR) and would like a call back to advise if pt is on a weight loss medication already.

## 2023-08-27 NOTE — Telephone Encounter (Signed)
Looked through the medication list and didn't see a weight loss medication

## 2023-08-28 DIAGNOSIS — E119 Type 2 diabetes mellitus without complications: Secondary | ICD-10-CM | POA: Diagnosis not present

## 2023-08-28 DIAGNOSIS — E7800 Pure hypercholesterolemia, unspecified: Secondary | ICD-10-CM | POA: Diagnosis not present

## 2023-08-29 DIAGNOSIS — F200 Paranoid schizophrenia: Secondary | ICD-10-CM | POA: Diagnosis not present

## 2023-08-29 DIAGNOSIS — F419 Anxiety disorder, unspecified: Secondary | ICD-10-CM | POA: Diagnosis not present

## 2023-08-29 NOTE — Telephone Encounter (Signed)
Spoke with group home manager and the weight loss issue can be discussed at February appointment

## 2023-09-04 DIAGNOSIS — E119 Type 2 diabetes mellitus without complications: Secondary | ICD-10-CM | POA: Diagnosis not present

## 2023-09-04 DIAGNOSIS — E7800 Pure hypercholesterolemia, unspecified: Secondary | ICD-10-CM | POA: Diagnosis not present

## 2023-09-04 DIAGNOSIS — I10 Essential (primary) hypertension: Secondary | ICD-10-CM | POA: Diagnosis not present

## 2023-09-04 DIAGNOSIS — E663 Overweight: Secondary | ICD-10-CM | POA: Diagnosis not present

## 2023-09-05 DIAGNOSIS — F200 Paranoid schizophrenia: Secondary | ICD-10-CM | POA: Diagnosis not present

## 2023-09-05 DIAGNOSIS — F418 Other specified anxiety disorders: Secondary | ICD-10-CM | POA: Diagnosis not present

## 2023-09-05 DIAGNOSIS — F331 Major depressive disorder, recurrent, moderate: Secondary | ICD-10-CM | POA: Diagnosis not present

## 2023-09-23 ENCOUNTER — Other Ambulatory Visit: Payer: Self-pay | Admitting: Family Medicine

## 2023-10-17 DIAGNOSIS — F209 Schizophrenia, unspecified: Secondary | ICD-10-CM | POA: Diagnosis not present

## 2023-10-27 ENCOUNTER — Other Ambulatory Visit: Payer: Self-pay | Admitting: Family Medicine

## 2023-11-22 ENCOUNTER — Ambulatory Visit: Payer: Medicare Other | Admitting: Family Medicine

## 2023-11-23 ENCOUNTER — Other Ambulatory Visit: Payer: Self-pay | Admitting: Family Medicine

## 2023-12-06 ENCOUNTER — Other Ambulatory Visit: Payer: Self-pay | Admitting: Family Medicine

## 2023-12-07 DIAGNOSIS — E119 Type 2 diabetes mellitus without complications: Secondary | ICD-10-CM | POA: Diagnosis not present

## 2023-12-07 DIAGNOSIS — H40053 Ocular hypertension, bilateral: Secondary | ICD-10-CM | POA: Diagnosis not present

## 2023-12-07 DIAGNOSIS — H5213 Myopia, bilateral: Secondary | ICD-10-CM | POA: Diagnosis not present

## 2023-12-07 LAB — HM DIABETES EYE EXAM

## 2023-12-18 ENCOUNTER — Other Ambulatory Visit: Payer: Self-pay | Admitting: Family Medicine

## 2023-12-18 DIAGNOSIS — E785 Hyperlipidemia, unspecified: Secondary | ICD-10-CM

## 2023-12-26 DIAGNOSIS — I10 Essential (primary) hypertension: Secondary | ICD-10-CM | POA: Diagnosis not present

## 2024-01-09 ENCOUNTER — Other Ambulatory Visit: Payer: Self-pay | Admitting: Family Medicine

## 2024-01-11 ENCOUNTER — Other Ambulatory Visit: Payer: Self-pay | Admitting: Family Medicine

## 2024-03-04 DIAGNOSIS — E7800 Pure hypercholesterolemia, unspecified: Secondary | ICD-10-CM | POA: Diagnosis not present

## 2024-03-04 DIAGNOSIS — E119 Type 2 diabetes mellitus without complications: Secondary | ICD-10-CM | POA: Diagnosis not present

## 2024-03-04 LAB — HEMOGLOBIN A1C: Hemoglobin A1C: 9.4

## 2024-03-17 ENCOUNTER — Other Ambulatory Visit: Payer: Self-pay | Admitting: Family Medicine

## 2024-03-20 ENCOUNTER — Other Ambulatory Visit: Payer: Self-pay | Admitting: Family Medicine

## 2024-03-26 DIAGNOSIS — E663 Overweight: Secondary | ICD-10-CM | POA: Diagnosis not present

## 2024-03-26 DIAGNOSIS — E119 Type 2 diabetes mellitus without complications: Secondary | ICD-10-CM | POA: Diagnosis not present

## 2024-03-26 DIAGNOSIS — I10 Essential (primary) hypertension: Secondary | ICD-10-CM | POA: Diagnosis not present

## 2024-03-26 DIAGNOSIS — E7800 Pure hypercholesterolemia, unspecified: Secondary | ICD-10-CM | POA: Diagnosis not present

## 2024-03-27 ENCOUNTER — Other Ambulatory Visit: Payer: Self-pay | Admitting: Family Medicine

## 2024-03-27 DIAGNOSIS — E785 Hyperlipidemia, unspecified: Secondary | ICD-10-CM

## 2024-03-31 ENCOUNTER — Ambulatory Visit: Payer: Medicare Other | Admitting: Family Medicine

## 2024-04-02 ENCOUNTER — Ambulatory Visit (HOSPITAL_COMMUNITY)
Admission: EM | Admit: 2024-04-02 | Discharge: 2024-04-02 | Disposition: A | Discharge status: Home / Self Care | Source: Ambulatory Visit | Attending: Psychiatry | Admitting: Psychiatry

## 2024-04-02 DIAGNOSIS — R42 Dizziness and giddiness: Secondary | ICD-10-CM | POA: Insufficient documentation

## 2024-04-02 DIAGNOSIS — F17290 Nicotine dependence, other tobacco product, uncomplicated: Secondary | ICD-10-CM | POA: Diagnosis not present

## 2024-04-02 DIAGNOSIS — R440 Auditory hallucinations: Secondary | ICD-10-CM | POA: Diagnosis present

## 2024-04-02 DIAGNOSIS — F209 Schizophrenia, unspecified: Secondary | ICD-10-CM | POA: Diagnosis not present

## 2024-04-02 DIAGNOSIS — Z9181 History of falling: Secondary | ICD-10-CM | POA: Diagnosis not present

## 2024-04-02 MED ORDER — CLONAZEPAM 1 MG PO TABS: 1.0000 mg | ORAL_TABLET | Freq: Two times a day (BID) | ORAL | Status: DC

## 2024-04-02 MED ORDER — OLANZAPINE 2.5 MG PO TABS: 2.5000 mg | ORAL_TABLET | Freq: Every day | ORAL | Status: DC | PRN

## 2024-04-02 MED ORDER — OLANZAPINE 20 MG PO TABS: 30.0000 mg | ORAL_TABLET | Freq: Every day | ORAL | Status: DC

## 2024-04-02 NOTE — ED Provider Notes (Signed)
 Behavioral Health Urgent Care Medical Screening Exam  Patient Name: Joseph [name] MRN: [id number] Date of Evaluation: 04/02/24 Chief Complaint:   Diagnosis:  Final diagnoses:  Schizophrenia, unspecified type (HCC)    History of Present illness: Joseph [name] is a 48 y.o. male.  Patient is interviewed with his daytime caretaker, [name], who has a list of patient's home medications in the room.  Patient says that he has been hearing a lot of voices that talk a lot about God which have been interfering with his daily life.  He takes olanzapine  40 mg nightly which helps and also has olanzapine  2.5 mg twice daily as needed for voices during the day.  However, the PRN medication has not been helping very much.  Patient says that these voices do not make much sense, and typically come on an the afternoon.  He denies paranoia and visual hallucinations.  He denies hearing voices at present. Caretaker reports that patient will be restless and get in the way of doing things with his father, like walking on the beach.  The voices are non-command and come from the inside of his head.  He knows that they are not real.  Patient said that yesterday he felt like I did not want to be alive because the voices made him frustrated, however he and [name] confirm that there was no acute safety risk. Denies plan and intent. He is able to contract for safety. No history of suicidal ideation or suicide attempt. Would be able to tell [name] or his father if such thoughts occur. He saw his outpatient psychiatrist a month ago, who did not make any medication changes at that time.  Would like to be set up with a new outpatient provider an be seen within a month.  Discussed various options, including walk-in at [facility] versus being set up with an appointment. Will follow with Stephanie Chien, PGY-3 at 7/14 @100  pm.   Denies down or depressed mood. Denies anxious feelings. Denies symptoms of mania. Endorses nicotine use (a vape  pen every two days) but denies all oterh substance use. Denis Paranoia and Visual hallucinations. Counselled to call [phone] or return to [facility] if he felt that his mental health threatened his life or the lives of others.   Flowsheet Row ED from 04/02/2024 in [facility] ED from 09/07/2022 in [facility] Department at [facility] ED to Hosp-Admission (Discharged) from 07/27/2022 in [facility]STE[facility]OWN  C-SSRS RISK CATEGORY No Risk No Risk No Risk    Psychiatric Specialty Exam  Presentation  General Appearance:Casual; Disheveled  Eye Contact:Fair  Speech:Clear and Coherent  Speech Volume:Normal   Mood and Affect  Mood:Depressed  Affect:Flat; Non-Congruent   Thought Process  Thought Processes:Coherent  Descriptions of Associations:Intact  Orientation:Full (Time, Place and Person)  Thought Content:Logical    Hallucinations:Auditory None at present, but these hallucinations have been saying weird stuff about god  Ideas of Reference:None  Suicidal Thoughts:Yes, Passive (Patient says that he felt like not being alive yesterday because the voices were annoying him. Contracted for safety. No history of SI. [name], daytime caretaker, also present, is not concerned for patient's safety.) Without Access to Means; Without Intent; Without Plan; Without Means to Carry Out  Homicidal Thoughts:No   Sensorium  Memory:Immediate Fair  Judgment:Good  Insight:Good   Executive Functions  Concentration:Good  Attention Span:Good  Recall:Good  Fund of Knowledge:Poor  Language:Fair   Psychomotor Activity  Psychomotor Activity:Normal   Assets  Assets:Desire for Improvement; Financial Resources/Insurance; Location manager; Social Support; Resilience   Sleep  Sleep:Fair  Number of hours: No data recorded  Physical Exam: Physical Exam Vitals reviewed.  Constitutional:      General: He is not in  acute distress.    Appearance: He is obese. He is not ill-appearing.  Pulmonary:     Effort: Pulmonary effort is normal. No respiratory distress.    Review of Systems  Neurological:  Positive for dizziness.       On standing, advised to take it slow during the day.  All other systems reviewed and are negative.  Blood pressure (!) 138/100, pulse 96, temperature 98.8 F (37.1 C), temperature source Oral, resp. rate 20, SpO2 99%. There is no height or weight on file to calculate BMI.  Musculoskeletal: Strength & Muscle Tone: within normal limits Gait & Station: normal Patient leans: N/A   [facility] MSE Discharge Disposition for Follow up and Recommendations: Based on my evaluation the patient does not appear to have an emergency medical condition and can be discharged with resources and follow up care in outpatient services for Medication Management  Changed olanzapine  40 mg at bedtime to 10 mg qam + 30 mg qpm. Continue 2.5 mg BID PRN for schizophrenia. Advised patient to take his time standing up and sitting down to avoid orthostasis and prevent falls in the setting of a new AM dosing.   Patient has follow up with Stephanie Chien, PGY-3 on 7/14 @ 1 pm at [facility].   Kyliana Standen, MD 04/02/2024, 4:24 PM
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
## 2024-04-02 NOTE — Discharge Instructions (Addendum)
 Good afternoon, Joseph [name]!  It was very nice to meet you! I hope you feel better soon. I've adjusted the way the medications should be taken in this paperwork.   Please call [facility] (phone number below) in order to set up an appointment with a psychiatrist. If it's more than a month from now, you can always show up at the [facility] building (2nd floor) early in the morning (around 6:45) and hopefully get a walk-in slot and be seen by an outpatient psychiatrist that same day (around 8:00.  I've also added some other options for outpatient services down below if you think there would be a better fit.   If you are worried about the voices, or they get worse or scary, please tell someone! If its an emergency and you feel like you're going to hurt yourself, please tell [name] or your dad or call [phone] and they can send someone to help you.  All best wishes, [name], MD [facility] Psychiatry Residency, PGY-2  Outpatient Services for Therapy and Medication Management for Medicaid  In case of an urgent crisis, you may contact the Mobile Crisis Unit with [facility] at [phone].        [facility] [address] SECOND FLOOR [place], [place] [address] [phone] OUTPATIENT Walk-in information: Please note, all walk-ins are first come & first serve, with limited number of availability.  Please note that to be eligible for services you must bring: ID or a piece of mail with your name [facility] address  Therapist for therapy:  Monday & Wednesdays: Please ARRIVE at 6:45 AM for registration Will START at 8:00 AM Every 1st & 2nd Friday of the month: Please ARRIVE at 10:15 AM for registration Will START at 1 PM - 5 PM  Psychiatrist for medication management: Monday - Friday:  Please ARRIVE at 6:45 AM for registration Will START at 8:00 AM  Regretfully, due to limited availability, please be aware that you may not been seen on the same day as  walk-in. Please consider making an appoint or try again. Thank you for your patience and understanding.    [facility] [address] [place], [place], [address] [phone] phone  [facility] [address] [place], [place], [address] [phone] phone [phone] phone ([address], Anthem/Elevance, [address], [place], [address], [address], Healthy [place], [place], [place], [address], [employer], [place], UHC, [employer], [place], Out of Network)  [employer], [place] [address] [place], [place], [address] [phone] phone ([place], Anthem/Elevance, [employer]SC[employer] Options/Carelon, BCBS, [address], [place], [place], [place], [place], [employer], [place], [place], [place], [facility])  [employer] [address] [place], [place], [address] [phone] phone (Medicaid, ask about other insurance)  [facility] [address], [address] [place], [place], [address] [phone] phone [phone] fax ([address], [place] , [place], [place], [place] Health Choice, UHC, [employer], Self-Pay)  Sarah Lempka [address] [place], [place], [address] [phone] phone ([address] Anthem/Elevance, [address], [address], [place], [employer], [place], [place], [place], [employer], [place], [place], [place], [facility])  [facility] - 6-8 MONTH WAIT FOR THERAPY; SOONER FOR MEDICATION MANAGEMENT [address], [address] [place], [place], [address] [phone] phone ([address] AmeriHealth [address] - [place], [address], [place], [place], Friday Health Plans, [address], BCBS Healthy [place], [place], [address], [place], [place], [place], [place], Tricare, UHC, [employer]co Corporation, [place])  [facility] [address] [place], [place], [address] [phone] phone  [facility] [address], [address] [place], [place], [address] [phone] phone   [facility] [address], [address] [place], [place], [address] [phone] phone  [facility] - THERAPY ONLY [address]  [place], [place], [address] [phone] phone  [facility], [place] [address], [place], [address] [phone] phone  [facility] [address] [place], [place], [address] [phone] phone [phone] fax  [facility] [address] [place], [place], [address] [phone] phone [phone] fax  [facility] [address] [place], [place], [address] [phone] phone  Janit Griffins [address] [place], [place], [address]  [phone] phone  [facility]  (Adults Only) [address] [place], [place], [address]  [phone] phone [phone] fax   Follow-up recommendations:  Activity:  Normal, as tolerated Diet:  Per PCP recommendation  Patient is instructed prior to discharge to: Take all medications as prescribed by his mental healthcare provider. Report any adverse effects and/or reactions from the medicines to his outpatient provider promptly. Patient has been instructed & cautioned: To not engage in alcohol and or illegal drug use while on prescription medicines.  In the event of worsening symptoms, patient is instructed to call the crisis hotline at [phone], 911 and or go to the nearest ED for appropriate evaluation and treatment of symptoms. To follow-up with his primary care provider for your other medical issues, concerns and or health care needs.

## 2024-04-04 NOTE — Progress Notes (Unsigned)
 Psychiatric Initial Adult Assessment  Patient Identification: Joseph [name] MRN:  [id number] Date of Evaluation:  04/07/2024 Referral Source: [facility]  Assessment:  Joseph [name] is a 48 y.o. male with a history of schizophrenia who presents in person to [facility] for initial evaluation of schizophrenia.  Patient reports breakthrough intermittent auditory hallucinations in the past 6 months of multiple voices speaking about God. He denies command auditory hallucinations. Per patient and collateral in the office there appears to be some benefit from current PRN zyprexa  but not sufficient control of his hallucinations. There are no command auditory hallucinations or increased agitation or aggression. Given patient is already on a dose of zyprexa  greater than FDA max with continued auditory hallucinations discussed keeping his current regimen the same at this time and working on decreasing his maintenance zyprexa  and increasing his PRN zyprexa  in future visits. Will also follow-up on his depakote  level and in the future work on decreasing his klonopin . Patient has support from family and caretaker in managing his medications. Also discussed with patient tobacco cessation. Discussed NRT but patient declined at this time.   Risk Assessment: A suicide and violence risk assessment was performed as part of this evaluation. There patient is deemed to be at chronic elevated risk for self-harm/suicide given the following factors: history of schizophrenia. These risk factors are mitigated by the following factors: lack of active SI/HI, no known access to weapons or firearms, motivation for treatment, supportive family, sense of responsibility to family and social supports, presence of a significant relationship, presence of an available support system, current treatment compliance, safe housing, support system in agreement with treatment recommendations, and presence of a safety plan with  follow-up care. The patient is deemed to be at chronic elevated risk for violence given the following factors: N/A. These risk factors are mitigated by the following factors: no known history of violence towards others and connectedness to family. There is no acute risk for suicide or violence at this time. The patient was educated about relevant modifiable risk factors including following recommendations for treatment of psychiatric illness and abstaining from substance abuse.   While future psychiatric events cannot be accurately predicted, the patient does not currently require  acute inpatient psychiatric care and does not currently meet [place]  involuntary commitment criteria.    Plan: # Schizophrenia  Past medication trials: haldol, risperdal, clozaril - reports had intense pain as an allergic reaction to all medications Status of problem: ongoing Interventions: -- continue zyprexa  10mg  AM, 30mg  PM for auditory hallucinations, will consider decrease in future visits   -- continue zyprexa  2.5 BID PRN for increased auditory hallucinations -- continue depakote  1000mg  at bedtime for additional mood stabilization   F/u depakote  level  --continue klonopin  1mg  BID for additional mood stabilization, can consider decrease in future visits   # History of depression Past medication trials: unknown Status of problem: resolved  Interventions: -- continue prozac  20mg  daily  #Tobacco use disorder --continue to encourage cessation --Declined NRT   Patient was given contact information for behavioral health clinic and was instructed to call 911 for emergencies.   Return to care in  Future Appointments  Date Time Provider Department Center  04/28/2024  2:00 PM Graham Krabbe, MD [facility] None   Patient was given contact information for behavioral health clinic and was instructed to call 911 for emergencies.    Patient and plan of care will be discussed with the Attending MD ,Dr. Carvin,  who agrees  with the above statement and plan.   Subjective:  Chief Complaint: there are times when he says he is hearing things   History of Present Illness:   Most recent labs: 04/2023 CMP wnl. Lipid panel with elevated TG, otherwise wnl. CBC wnl. A1c wnl. TSH wnl. EKG 08/2022: sinus rhythm, Qtc 460.  Labs: CareEverywhere 03/05/2024 - CMP Cr, AST/ALT wnl. Lipid panel LDL 63, total cholesterol 865. TSH wnl. A1c 9.4.  Sleep study with OSA 08/2013 Seen at [facility] 03/2024: Hearing voices that talk about God --medication change: zyprexa  40mg  at bedtime to 10mg  AM, 30mg  at bedtime   Patient presents with his dad [place] and caretaker [name].   They report they feel that the zyprexa  2.5mg  PRN and it takes a day for it to work. Reports the zyprexa  does not help as much. Reports increased worry due to auditory hallucinations once every 4 days.   Reports vaping this morning. Declines NRT.   Initially states schizophrenia was diagnosed 10 years ago, later on reports that he was having symptoms since he was 48 years old. He lived in a group home [facility]) for 20 years prior to moving back home.  Reports hearing multiple voices, reports used to be mumbling, reports over the last 6 months have been hearing more what they've been saying, reports saying a lot of stuff about God, reporting saying all kinds of confusing stuff, stating that he has to learn about something related to God but doesn't know what they're talking about. Denies command auditory hallucination. Reports the voices are intermittent, not hearing anything currently but occasionally will get worse and reports it occurs randomly during the daytime. Denies triggers. Denies hallucinations at night. Patient reports feels like when he gets the additional olanzapine  and the voices decrease significantly. Caretaker and dad feels like it doesn't work because he is still staring at the ceiling. They deny that he has increased agitation. Reports one episode  every 4-5 days, where he is looking to the ceiling. Does not have visual hallucinations. Denies increased sleepiness with the zyprexa  during the daytime. Denies other side effects to the medications including symptoms of EPS.  Denies depressive symptoms. Denies increased anxiety. Denies manic/hypomanic symptoms. Reports sleep has been great, will try to go to bed by 8pm. Denies issues with appetite. Reports that he is compliant with medication regimen which currently consists of klonopin  1mg  BID, depakote  1000mg  ER at bedtime, prozac  20mg  at bedtime, zyprexa  10mg  AM, 30mg  at bedtime zyprexa  2.5 BID PRN. Discussed will continue current medication regimen but will work on goal of decreasing his maintenance regimen and increasing his PRN medication. Discussed metabolic side effects of zyprexa . Discussed will obtain depakote  level.    PHQ-9: 0 GAD-7: 3 (feeling nervous, worrying too much, trouble relaxing)   PCP: [facility] Clarice [facility]   Past Psychiatric History:  Diagnoses: alcohol abuse, schizoaffective disorder/schizophrenia Medication trials:   Current: klonopin  1mg  BID, depakote  1000mg  ER at bedtime, prozac  20mg  at bedtime, zyprexa  10mg  AM, 30mg  at bedtime zyprexa  2.5 BID PRN   Past: haldol, risperdal, clozaril - reports had intense pain as an allergic reaction, anafanil   Previous psychiatrist/therapist: Dr. Shirline 08/2023 Hospitalizations: none in chart review, [facility] at [facility] 03/2024 by Dr. Rollene Suicide attempts: denies  SIB: denies  Hx of violence towards others: denies  Current access to guns: denies  Hx of trauma/abuse: denies   Substance Abuse History in the last 12 months:  Yes.   UDS, PDMP PDMP klonopin  60 tabs 30  days filled 03/26/24, other rx for klonopin  1mg  BID UDS neg 08/2022. Prior alc 07/2022 <10, 06/2022 - 204  (Frequency, quantity, last use, impact) Alcohol: denies  Tobacco:  Vape pen every day, reports thinking about quitting since they've stopped  making his vape Cannabis: denies   Past Medical History:  Past Medical History:  Diagnosis Date   Anemia    post operative   Antiphospholipid syndrome (HCC) 08/15/2011   Anxiety    Blood transfusion without reported diagnosis    x 2 per pt    Cellulitis    bilateral thighs   Cerumen impaction 07/13/2013   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HCC)    Constipation in male    Depression    Diabetes (HCC)    no medications    Elevated BP 10/20/2012   Enlarged thyroid  03/07/2015   H/O tobacco use, presenting hazards to health 12/01/2011   Quit October 2014    Hyperlipidemia    Lupus anticoagulant positive 08/15/2011   Medicare annual wellness visit, subsequent 03/19/2016   Obesity    Schizophrenia (HCC)    Sleep apnea    wears cpap, no 02   HTN OSA on CPAP  Iron deficiency  Past Surgical History:  Procedure Laterality Date   COLONOSCOPY     eye surgery     b/l laser eye sx 2004   I & D EXTREMITY     left leg   LEG SURGERY     TONSILLECTOMY     WISDOM TOOTH EXTRACTION     PCP: Dr. Andree [facility]  Medical Dx: HTN, OSA, HLD, DM  Family Psychiatric History: denies   Family History:  Family History  Problem Relation Age of Onset   Diverticulosis Father    Stomach cancer Mother        stomach CA dx at 79   GER disease Mother    Cancer Mother        stomach   Colon cancer Neg Hx    Pancreatic cancer Neg Hx    Esophageal cancer Neg Hx    Colon polyps Neg Hx    Rectal cancer Neg Hx     Social History:   Academic/Vocational:  Housing: live with dad since February, prior to that was living in [place] for 20 years as well as caretaker 7 days/week.  Income: gets disability income from medical conditions  Family: dad Howdy Support: dad and caretaker Children: none  Marital Status: never married  Has daytime caretaker [name]  Access to firearms: none Medication stockpile: none, [name] helps with medications  Social History   Socioeconomic History   Marital status:  Single    Spouse name: Not on file   Number of children: 0   Years of education: Not on file   Highest education level: Not on file  Occupational History    Employer: [employer]    Comment: [employer]  Tobacco Use   Smoking status: Former    Current packs/day: 0.00    Average packs/day: 1 pack/day for 8.8 years (8.8 ttl pk-yrs)    Types: Cigars, Cigarettes    Start date: 07/15/2012    Quit date: 04/25/2021    Years since quitting: 2.9   Smokeless tobacco: Former    Types: Chew   Tobacco comments:    4 cigars A DAY        Now vaping  Vaping Use   Vaping status: Every Day  Substance and Sexual Activity   Alcohol use: No  Drug use: No   Sexual activity: Not Currently  Other Topics Concern   Not on file  Social History Narrative   Lives in a Group Home - his dad pays for it - Visits with Dad every Saturday and Sunday   Social Drivers of Health   Financial Resource Strain: Low Risk  (06/08/2023)   Overall Financial Resource Strain (CARDIA)    Difficulty of Paying Living Expenses: Not hard at all  Food Insecurity: No Food Insecurity (06/08/2023)   Hunger Vital Sign    Worried About Running Out of Food in the Last Year: Never true    Ran Out of Food in the Last Year: Never true  Transportation Needs: No Transportation Needs (06/08/2023)   PRAPARE - Administrator, Civil Service (Medical): No    Lack of Transportation (Non-Medical): No  Physical Activity: Sufficiently Active (06/08/2023)   Exercise Vital Sign    Days of Exercise per Week: 7 days    Minutes of Exercise per Session: 40 min  Stress: No Stress Concern Present (06/08/2023)   [employer] of Occupational Health - Occupational Stress Questionnaire    Feeling of Stress : Not at all  Social Connections: Moderately Isolated (06/08/2023)   Social Connection and Isolation Panel    Frequency of Communication with Friends and Family: More than three times a week    Frequency of Social Gatherings with  Friends and Family: Three times a week    Attends Religious Services: More than 4 times per year    Active Member of Clubs or Organizations: No    Attends Banker Meetings: Never    Marital Status: Never married    Additional Social History: updated  Allergies:   Allergies  Allergen Reactions   Haloperidol Other (See Comments)   Statins Other (See Comments)    ? Related to compartment syndrome   Aripiprazole Other (See Comments)   Haldol [Haloperidol Decanoate]     Painful, HA   Risperidone And Paliperidone     Risperdol per pt     Current Medications: Current Outpatient Medications  Medication Sig Dispense Refill   OLANZapine  (ZYPREXA ) 10 MG tablet Take 1 tablet (10 mg total) by mouth daily. 30 tablet 0   acetaminophen  (TYLENOL ) 325 MG tablet 1 tab po every 8 hours as needed for pain 30 tablet 0   aspirin  EC (ASPIRIN  LOW DOSE) 81 MG tablet TAKE 1 TABLET BY MOUTH DAILY 90 tablet 1   Blood Glucose Monitoring Suppl (ONETOUCH VERIO) w/Device KIT Blood Glucose Monitoring Check once daily 1 kit 0   [START ON 04/26/2024] clonazePAM  (KLONOPIN ) 1 MG tablet Take 1 tablet (1 mg total) by mouth 2 (two) times daily. 60 tablet 0   divalproex  (DEPAKOTE  ER) 500 MG 24 hr tablet Take 2 tablets (1,000 mg total) by mouth at bedtime. 60 tablet 0   docusate sodium  (COLACE) 100 MG capsule TAKE 1 CAPSULE(100 MG) BY MOUTH TWICE DAILY 60 capsule 11   ezetimibe  (ZETIA ) 10 MG tablet Take 1 tablet (10 mg total) by mouth daily. 90 tablet 0   Fiber, Corn Dextrin, POWD TAKE 1 DOSE BY MOUTH IN THE MORNING AND AT AT BEDTIME 350 g 5   FLUoxetine  (PROZAC ) 20 MG tablet Take 1 tablet (20 mg total) by mouth at bedtime. 30 tablet 0   Lancets (ONETOUCH DELICA PLUS LANCET30G) MISC 1 each by Other route daily. Glucose 90 each 3   lisinopril  (ZESTRIL ) 2.5 MG tablet Take 1 tablet (2.5 mg total)  by mouth daily. 90 tablet 0   loratadine  (CLARITIN ) 10 MG tablet TAKE 1 TABLET(10 MG) BY MOUTH DAILY 90 tablet 1    meloxicam  (MOBIC ) 15 MG tablet TAKE 1/2 TO 1 TABLET BY MOUTH EVERY DAY AS NEEDED 30 tablet 1   Multiple Vitamin (MULTIVITAMIN) TABS Take 1 tablet by mouth daily. 90 tablet 1   OLANZapine  (ZYPREXA ) 2.5 MG tablet Take 1 tablet (2.5 mg total) by mouth 2 (two) times daily as needed. 60 tablet 0   OLANZapine  (ZYPREXA ) 20 MG tablet Take 1.5 tablets (30 mg total) by mouth at bedtime. 45 tablet 0   ONETOUCH VERIO test strip 1 each by Other route in the morning and at bedtime. 100 each 12   polyethylene glycol powder (GLYCOLAX /MIRALAX ) 17 GM/SCOOP powder Take 17 g by mouth daily. Mix in liquid 1530 g 1   rosuvastatin  (CRESTOR ) 10 MG tablet Take 10 mg by mouth daily.     SYNJARDY  12.01-999 MG TABS Take 1 tablet by mouth 2 (two) times daily.     Wheat Dextrin (CLEAR SOLUBLE FIBER) POWD Take 1 Dose by mouth in the morning and at bedtime. 477 g 11   No current facility-administered medications for this visit.    ROS: Review of Systems  Respiratory:  Negative for shortness of breath.   Cardiovascular:  Negative for chest pain.  Psychiatric/Behavioral:  Negative for suicidal ideas.    Objective:  Psychiatric Specialty Exam: Blood pressure 134/88, pulse 93, weight 294 lb 12.8 oz (133.7 kg).Body mass index is 38.89 kg/m.  General Appearance: Fairly Groomed, obese, wearing red polo  Eye Contact:  Fair  Speech:  Clear and Coherent  Volume:  Normal  Mood:  Euthymic  Affect:  Flat  Thought Content: no current hallucinations, reports intermittent auditory hallucinations  Suicidal Thoughts:  No  Homicidal Thoughts:  No  Thought Process:  Linear  Orientation:  Full (Time, Place, and Person)    Memory:  Grossly intact   Judgment:  Intact  Insight:  Present  Concentration:  Concentration: Fair  Recall:  not formally assessed   Fund of Knowledge: Fair  Language: Fair  Psychomotor Activity:  Normal  Akathisia:  No  AIMS (if indicated): not done  Assets:  Engineer, maintenance Social  Support  ADL's:  Intact  Cognition: WNL  Sleep:  Good   PE: General: well-appearing; no acute distress  Pulm: no increased work of breathing on room air  Strength & Muscle Tone: within normal limits Neuro: no focal neurological deficits observed  Gait & Station: normal  Metabolic Disorder Labs: Lab Results  Component Value Date   HGBA1C 9.4 03/04/2024   MPG 142.72 07/15/2022   MPG 123 (H) 12/31/2012   No results found for: PROLACTIN Lab Results  Component Value Date   CHOL 104 05/22/2023   TRIG 162.0 (H) 05/22/2023   HDL 45.90 05/22/2023   CHOLHDL 2 05/22/2023   VLDL 32.4 05/22/2023   LDLCALC 26 05/22/2023   LDLCALC 37 07/25/2022   Lab Results  Component Value Date   TSH 1.98 05/22/2023    Therapeutic Level Labs: No results found for: LITHIUM No results found for: CBMZ Lab Results  Component Value Date   VALPROATE 22 (L) 07/27/2022    Screenings:  GAD-7    Flowsheet Row Office Visit from 05/22/2023 in [facility] Office Visit from 11/23/2022 in [facility] at [facility]  Total GAD-7 Score 0 0   Mini-Mental  Flowsheet Row Office Visit from 05/25/2022 in [facility]  Total Score (max 30 points ) 26   PHQ2-9    Flowsheet Row Clinical Support from 06/08/2023 in [facility] at [facility] Office Visit from 05/22/2023 in [facility] at [facility] Office Visit from 11/23/2022 in [facility] at [facility] Office Visit from 07/25/2022 in [facility] Care at [facility] Clinical Support from 06/06/2022 in [facility] at [facility]  PHQ-2 Total Score 0 0 0 2 0  PHQ-9 Total Score -- 0 0 11 --   Flowsheet Row ED from 04/02/2024 in [facility] ED from 09/07/2022 in [facility] Emergency  Department at [facility] ED to Hosp-Admission (Discharged) from 07/27/2022 in [facility]ICU/STEPDOWN  C-SSRS RISK CATEGORY No Risk No Risk No Risk    Collaboration of Care: Collaboration of Care: Medication Management AEB Dr. Carvin  Patient/Guardian was advised Release of Information must be obtained prior to any record release in order to collaborate their care with an outside provider. Patient/Guardian was advised if they have not already done so to contact the registration department to sign all necessary forms in order for us  to release information regarding their care.   Consent: Patient/Guardian gives verbal consent for treatment and assignment of benefits for services provided during this visit. Patient/Guardian expressed understanding and agreed to proceed.   Ryen Rhames, MD, PGY-3 7/14/20254:53 PM

## 2024-04-07 ENCOUNTER — Ambulatory Visit (HOSPITAL_BASED_OUTPATIENT_CLINIC_OR_DEPARTMENT_OTHER): Admitting: Psychiatry

## 2024-04-07 ENCOUNTER — Encounter (HOSPITAL_COMMUNITY): Payer: Self-pay | Admitting: Psychiatry

## 2024-04-07 VITALS — BP 134/88 | HR 93 | Wt 294.8 lb

## 2024-04-07 DIAGNOSIS — F209 Schizophrenia, unspecified: Secondary | ICD-10-CM | POA: Diagnosis not present

## 2024-04-07 MED ORDER — OLANZAPINE 10 MG PO TABS: 10.0000 mg | ORAL_TABLET | Freq: Every day | ORAL | 0 refills | Status: DC

## 2024-04-07 MED ORDER — OLANZAPINE 2.5 MG PO TABS: 2.5000 mg | ORAL_TABLET | Freq: Two times a day (BID) | ORAL | 0 refills | Status: DC | PRN

## 2024-04-07 MED ORDER — CLONAZEPAM 1 MG PO TABS: 1.0000 mg | ORAL_TABLET | Freq: Two times a day (BID) | ORAL | 0 refills | Status: DC

## 2024-04-07 MED ORDER — FLUOXETINE HCL 20 MG PO TABS: 20.0000 mg | ORAL_TABLET | Freq: Every day | ORAL | 0 refills | Status: DC

## 2024-04-07 MED ORDER — DIVALPROEX SODIUM ER 500 MG PO TB24: 1000.0000 mg | ORAL_TABLET | Freq: Every day | ORAL | 0 refills | Status: DC

## 2024-04-07 MED ORDER — OLANZAPINE 20 MG PO TABS
30.0000 mg | ORAL_TABLET | Freq: Every day | ORAL | 0 refills | Status: DC
Start: 2024-04-07 — End: 2024-04-28

## 2024-04-08 ENCOUNTER — Ambulatory Visit (HOSPITAL_COMMUNITY): Payer: Self-pay | Admitting: Psychiatry

## 2024-04-08 ENCOUNTER — Other Ambulatory Visit (HOSPITAL_COMMUNITY): Payer: Self-pay

## 2024-04-08 ENCOUNTER — Telehealth (HOSPITAL_COMMUNITY): Payer: Self-pay

## 2024-04-08 LAB — VALPROIC ACID LEVEL: Valproic Acid Lvl: 31 ug/mL — ABNORMAL LOW (ref 50–100)

## 2024-04-08 NOTE — Telephone Encounter (Signed)
 Patients dad called - I received verbal permission from the patient to speak to his father. The father is concerned that the patient has been sleeping too much and after googleing the medications, he believes it is the Depakote . I advised patient I would speak with the provider. I will note that he is not at theraputic levels for the Depakote  and he takes Zyprexa  during the day, which will make him sleepy until he gets used to it. Please review and advise, thank you

## 2024-04-08 NOTE — Addendum Note (Signed)
 Addended by: CARVIN CROCK on: 04/08/2024 08:29 AM   Modules accepted: Level of Service

## 2024-04-09 NOTE — Telephone Encounter (Signed)
 I called patient's dad back and discussed our plan from visit yesterday. He reports he noticed patient has been taking afternoon naps but had not noticed that this was an acute change from our visit yesterday. He states that he did not notice increased napping time with PRN zyrepxa. We discussed the subtherapeutic depakote  level, that we would continue our plan as outlined yesterday and we will consider tapering his zyprexa  in the future. He expressed appreciation for the call and the office staff.

## 2024-04-19 ENCOUNTER — Other Ambulatory Visit: Payer: Self-pay | Admitting: Family Medicine

## 2024-04-21 ENCOUNTER — Encounter: Payer: Self-pay | Admitting: *Deleted

## 2024-04-24 DIAGNOSIS — E663 Overweight: Secondary | ICD-10-CM | POA: Diagnosis not present

## 2024-04-24 DIAGNOSIS — I10 Essential (primary) hypertension: Secondary | ICD-10-CM | POA: Diagnosis not present

## 2024-04-24 DIAGNOSIS — E7800 Pure hypercholesterolemia, unspecified: Secondary | ICD-10-CM | POA: Diagnosis not present

## 2024-04-24 DIAGNOSIS — E119 Type 2 diabetes mellitus without complications: Secondary | ICD-10-CM | POA: Diagnosis not present

## 2024-04-25 NOTE — Progress Notes (Signed)
 BH MD Outpatient Progress Note  04/28/2024 5:25 PM CHAU SAWIN [name]  MRN:  [id number]  Assessment:  Joseph [name] presents for follow-up evaluation. Today, 04/28/24, patient reports that he is doing well. He reports continued hallucinations though he notes that these have decreased. He denies any command auditory hallucinations. We discussed plan to decrease zyprexa  given he is on supratherapeutic dose as well as his increased somnolence and both patient and father were in agreement with plan. Differential for his increased somnolence includes medication effect (zyprexa , depakote , klonopin ) vs. untreated OSA vs. Primary psychological insomnia. Patient, caregiver, and dad are aware of crisis resources if patient's hallucinations increase and he is unable to keep himself safe including presenting to [facility] (which they did previously) as well as emergency services. Otherwise the PRN zyprexa  appears to be working well for the patient. Patient also started wegovy for weight loss. Discussed will keep other medications the same.   Identifying Information: Joseph [name] is a 48 y.o. male with a history of schizophrenia who is an established patient with [facility] for management of medications.  Risk Assessment: An assessment of suicide and violence risk factors was performed as part of this evaluation and is not significantly changed from the last visit.             While future psychiatric events cannot be accurately predicted, the patient does not currently require acute inpatient psychiatric care and does not currently meet [place]  involuntary commitment criteria.          Plan:  # Schizophrenia  Past medication trials: haldol, risperdal, clozaril - reports had intense pain as an allergic reaction to all medications Status of problem: ongoing Interventions: -- decrease zyprexa  10mg  AM, 20mg  PM for auditory hallucinations, will consider additional decrease in  future visits    02/2024 A1c 9.4, Lipid panel with elevated cholesterol -- continue zyprexa  2.5 BID PRN for increased auditory hallucinations -- continue depakote  1000mg  at bedtime for additional mood stabilization              03/2024 depakote  level 31  --continue klonopin  1mg  BID for additional mood stabilization, can consider decrease in future visits   # History of depression Past medication trials: unknown Status of problem: resolved  Interventions: -- continue prozac  20mg  daily  #Tobacco use disorder --continue to encourage cessation --Declined NRT  Return to care in:  Future Appointments  Date Time Provider Department Center  06/04/2024  2:00 PM Graham Krabbe, MD [facility] None   Health maintenance:  PCP: Dr. Andree [facility]  Medical Dx: HTN, OSA, HLD, DM  Prior Labs:  04/2023 CMP wnl. Lipid panel with elevated TG, otherwise wnl. CBC wnl. A1c wnl. TSH wnl. EKG 08/2022: sinus rhythm, Qtc 460.  Labs: CareEverywhere 03/05/2024 - CMP Cr, AST/ALT wnl. Lipid panel LDL 63, total cholesterol 865. TSH wnl. A1c 9.4. 06/2022 MRI brain wnl. 05/2022 EEG wnl.  Sleep study with OSA 08/2013  Patient was given contact information for behavioral health clinic and was instructed to call 911 for emergencies.   Patient and plan of care will be discussed with the Attending MD ,Dr. Carvin, who agrees with the above statement and plan.   Subjective:  Chief Complaint: No chief complaint on file.  Interval History:  Labs: CMP, CBC, UDS, PDMP PDMP klonopin  60 tabs 30 days filled 03/26/24 Interval notes: Received telephone call from patient's dad regarding increased somnolence Pre-charting: problem list, meds, prior encounters, no shows  Patient presents with father Joseph [name] and caretaker [name]. Reports no voices today. Reports hearing the voices several times a day every day, all throughout the day. Reports voices have become softer. Reports have become less frequent, about once an hour.  Reports still hearing a lot of voices saying stuff about God. Reports when he plays the guitar and concentrates on other things then he doesn't hear the voices. Reports just saying stuff about God, again denies command hallucinations Reports intermittently having difficulty sleeping at night. Discussed OSA diagnosis. Reports he does not use CPAP at night. Feels like too cumbersome. Reports he did not notice improved sleep with using CPAP machine for 7-8 years, didn't feel comfortable with it. Reports that he is sleeping during the day. Reports once or twice a week will sleep for an hour or two. Pretty much every day sleeping until 12pm. Reports going to bed around 8pm, sleeping at 10pm, then will wake up in the morning and then go back to sleep after his medication. Reports this week did not take zyprexa  PRN. He has lost weight with the wegovy injection. Reports he was using PRN 4-5 times per week and this weekend did not use PRN at all. Reports primary care doctor stated he needed a driving test. Asked about DMV form, discussed that we can take a look at the form together and caretaker will plan to bring at next visit. Reports decreased appetite, now taking the wegovy injection. Reports feels like mood is great, state voices are less than before. Reports that he is still vaping every day. Reports he is using the vape when he feels anxious. Reports he does not crave the nicotine and again declines NRT. They feel that the PRN works well. Discussed again FDA max of zyprexa  is 20mg  and given improvement we will continue to work on decreasing his maintenance zyprexa . They were in agreement.  PHQ-9: 3 (1 feeling down, 2 sleeping too much)  GAD-7: 2 (2 for feeling nervous, 1 for worrying too much)   Visit Diagnosis:    ICD-10-CM   1. History of depression  Z86.59 FLUoxetine  (PROZAC ) 20 MG tablet    2. Schizophrenia, unspecified type (HCC)  F20.9 OLANZapine  (ZYPREXA ) 2.5 MG tablet    OLANZapine  (ZYPREXA ) 10 MG  tablet    FLUoxetine  (PROZAC ) 20 MG tablet    clonazePAM  (KLONOPIN ) 1 MG tablet    divalproex  (DEPAKOTE  ER) 500 MG 24 hr tablet    OLANZapine  (ZYPREXA ) 20 MG tablet    3. Tobacco use disorder  F17.200       Past Psychiatric History:  Diagnoses: alcohol abuse, schizoaffective disorder/schizophrenia Medication trials:              Current: klonopin  1mg  BID, depakote  1000mg  ER at bedtime, prozac  20mg  at bedtime, zyprexa  10mg  AM, 30mg  at bedtime zyprexa  2.5 BID PRN              Past: haldol, risperdal, clozaril - reports had intense pain as an allergic reaction, anafanil   Previous psychiatrist/therapist: Dr. Shirline 08/2023 Hospitalizations: none in chart review, seen at [facility] 03/2024 by Dr. Rollene Suicide attempts: denies  SIB: denies  Hx of violence towards others: denies  Current access to guns: denies  Hx of trauma/abuse: denies   Substance Use History:  PDMP klonopin  60 tabs 30 days filled 03/26/24, other rx for klonopin  1mg  BID UDS neg 08/2022. Prior alc 07/2022 <10, 06/2022 - 204  Alcohol: denies  Tobacco:   Vape pen every day, reports thinking about  quitting since they've stopped making his vape Cannabis: denies   Past Medical History:  Past Medical History:  Diagnosis Date   Anemia    post operative   Antiphospholipid syndrome (HCC) 08/15/2011   Anxiety    Blood transfusion without reported diagnosis    x 2 per pt    Cellulitis    bilateral thighs   Cerumen impaction 07/13/2013   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HCC)    Constipation in male    Depression    Diabetes (HCC)    no medications    Elevated BP 10/20/2012   Enlarged thyroid  03/07/2015   H/O tobacco use, presenting hazards to health 12/01/2011   Quit October 2014    Hyperlipidemia    Lupus anticoagulant positive 08/15/2011   Medicare annual wellness visit, subsequent 03/19/2016   Obesity    Schizophrenia (HCC)    Sleep apnea    wears cpap, no 02    Past Surgical History:  Procedure Laterality Date   COLONOSCOPY      eye surgery     b/l laser eye sx 2004   I & D EXTREMITY     left leg   LEG SURGERY     TONSILLECTOMY     WISDOM TOOTH EXTRACTION     Family Psychiatric History: denies   Family History:  Family History  Problem Relation Age of Onset   Diverticulosis Father    Stomach cancer Mother        stomach CA dx at 85   GER disease Mother    Cancer Mother        stomach   Colon cancer Neg Hx    Pancreatic cancer Neg Hx    Esophageal cancer Neg Hx    Colon polyps Neg Hx    Rectal cancer Neg Hx     Social History:  Academic/Vocational:  Housing: live with dad since February, prior to that was living in [place] for 20 years as well as caretaker 7 days/week.  Income: gets disability income from medical conditions  Family: dad Howdy Support: dad and caretaker Children: none  Marital Status: never married  Has daytime caretaker [name]   Access to firearms: none Medication stockpile: none, [name] helps with medications  Substance Use History:   Social History   Socioeconomic History   Marital status: Single    Spouse name: Not on file   Number of children: 0   Years of education: Not on file   Highest education level: Not on file  Occupational History    Employer: [employer]    Comment: [employer]  Tobacco Use   Smoking status: Former    Current packs/day: 0.00    Average packs/day: 1 pack/day for 8.8 years (8.8 ttl pk-yrs)    Types: Cigars, Cigarettes    Start date: 07/15/2012    Quit date: 04/25/2021    Years since quitting: 3.0   Smokeless tobacco: Former    Types: Chew   Tobacco comments:    4 cigars A DAY        Now vaping  Vaping Use   Vaping status: Every Day  Substance and Sexual Activity   Alcohol use: No   Drug use: No   Sexual activity: Not Currently  Other Topics Concern   Not on file  Social History Narrative   Lives in a Group Home - his dad pays for it - Visits with Dad every Saturday and Sunday   Social Drivers of [employer]  Resource Strain: Low Risk  (06/08/2023)   Overall Financial Resource Strain (CARDIA)    Difficulty of Paying Living Expenses: Not hard at all  Food Insecurity: No Food Insecurity (06/08/2023)   Hunger Vital Sign    Worried About Running Out of Food in the Last Year: Never true    Ran Out of Food in the Last Year: Never true  Transportation Needs: No Transportation Needs (06/08/2023)   PRAPARE - Administrator, Civil Service (Medical): No    Lack of Transportation (Non-Medical): No  Physical Activity: Sufficiently Active (06/08/2023)   Exercise Vital Sign    Days of Exercise per Week: 7 days    Minutes of Exercise per Session: 40 min  Stress: No Stress Concern Present (06/08/2023)   [employer] of Occupational Health - Occupational Stress Questionnaire    Feeling of Stress : Not at all  Social Connections: Moderately Isolated (06/08/2023)   Social Connection and Isolation Panel    Frequency of Communication with Friends and Family: More than three times a week    Frequency of Social Gatherings with Friends and Family: Three times a week    Attends Religious Services: More than 4 times per year    Active Member of Clubs or Organizations: No    Attends Banker Meetings: Never    Marital Status: Never married    Allergies:  Allergies  Allergen Reactions   Haloperidol Other (See Comments)   Statins Other (See Comments)    ? Related to compartment syndrome   Aripiprazole Other (See Comments)   Haldol [Haloperidol Decanoate]     Painful, HA   Risperidone And Paliperidone     Risperdol per pt     Current Medications: Current Outpatient Medications  Medication Sig Dispense Refill   OLANZapine  (ZYPREXA ) 20 MG tablet Take 1 tablet (20 mg total) by mouth at bedtime. 30 tablet 1   acetaminophen  (TYLENOL ) 325 MG tablet 1 tab po every 8 hours as needed for pain 30 tablet 0   aspirin  EC (ASPIRIN  LOW DOSE) 81 MG tablet TAKE 1 TABLET BY MOUTH DAILY 90 tablet 1    Blood Glucose Monitoring Suppl (ONETOUCH VERIO) w/Device KIT Blood Glucose Monitoring Check once daily 1 kit 0   clonazePAM  (KLONOPIN ) 1 MG tablet Take 1 tablet (1 mg total) by mouth 2 (two) times daily. 74 tablet 0   divalproex  (DEPAKOTE  ER) 500 MG 24 hr tablet Take 2 tablets (1,000 mg total) by mouth at bedtime. 60 tablet 1   docusate sodium  (COLACE) 100 MG capsule TAKE 1 CAPSULE(100 MG) BY MOUTH TWICE DAILY 60 capsule 11   ezetimibe  (ZETIA ) 10 MG tablet Take 1 tablet (10 mg total) by mouth daily. 90 tablet 0   Fiber, Corn Dextrin, POWD TAKE 1 DOSE BY MOUTH IN THE MORNING AND AT AT BEDTIME 350 g 5   FLUoxetine  (PROZAC ) 20 MG tablet Take 1 tablet (20 mg total) by mouth at bedtime. 30 tablet 1   Lancets (ONETOUCH DELICA PLUS LANCET30G) MISC 1 each by Other route daily. Glucose 90 each 3   lisinopril  (ZESTRIL ) 2.5 MG tablet Take 1 tablet (2.5 mg total) by mouth daily. 90 tablet 0   loratadine  (CLARITIN ) 10 MG tablet TAKE 1 TABLET(10 MG) BY MOUTH DAILY 90 tablet 1   meloxicam  (MOBIC ) 15 MG tablet TAKE 1/2 TO 1 TABLET BY MOUTH EVERY DAY AS NEEDED 30 tablet 1   Multiple Vitamin (MULTIVITAMIN) TABS TAKE 1 TABLET BY MOUTH DAILY 90 tablet 0  OLANZapine  (ZYPREXA ) 10 MG tablet Take 1 tablet (10 mg total) by mouth daily. 30 tablet 1   OLANZapine  (ZYPREXA ) 2.5 MG tablet Take 1 tablet (2.5 mg total) by mouth 2 (two) times daily as needed. 60 tablet 1   ONETOUCH VERIO test strip 1 each by Other route in the morning and at bedtime. 100 each 12   polyethylene glycol powder (GLYCOLAX /MIRALAX ) 17 GM/SCOOP powder Take 17 g by mouth daily. Mix in liquid 1530 g 1   rosuvastatin  (CRESTOR ) 10 MG tablet Take 10 mg by mouth daily.     SYNJARDY  12.01-999 MG TABS Take 1 tablet by mouth 2 (two) times daily.     Wheat Dextrin (CLEAR SOLUBLE FIBER) POWD Take 1 Dose by mouth in the morning and at bedtime. 477 g 11   No current facility-administered medications for this visit.    ROS: Review of Systems Respiratory:   Negative for shortness of breath.   Cardiovascular:  Negative for chest pain.  Gastrointestinal:  Negative for abdominal pain, constipation, diarrhea, nausea and vomiting.  Neurological:  Negative for headaches.   Objective:  Psychiatric Specialty Exam: Blood pressure 134/78, pulse 97, weight 284 lb 12.8 oz (129.2 kg).Body mass index is 37.57 kg/m.  General Appearance: Casual, wearing glasses and tshirt  Eye Contact:  Good  Speech:  Clear and Coherent  Volume:  Normal  Mood:  Euthymic  Affect:  Appropriate  Thought Content: Logical   Suicidal Thoughts:  No  Homicidal Thoughts:  No  Thought Process:  Coherent  Orientation:  Full (Time, Place, and Person)    Memory: Grossly intact   Judgment:  Intact  Insight:  Present  Concentration:  Concentration: Fair  Recall: not formally assessed   Fund of Knowledge: Fair  Language: Fair  Psychomotor Activity:  Normal  Akathisia:  No  AIMS (if indicated): not done  Assets:  Communication Skills Desire for Improvement Financial Resources/Insurance Housing Social Support Talents/Skills  ADL's:  Intact  Cognition: WNL  Sleep:  Fair   PE: General: well-appearing; no acute distress  Pulm: no increased work of breathing on room air  Strength & Muscle Tone: within normal limits Neuro: no focal neurological deficits observed  Gait & Station: normal  Metabolic Disorder Labs: Lab Results  Component Value Date   HGBA1C 9.4 03/04/2024   MPG 142.72 07/15/2022   MPG 123 (H) 12/31/2012   No results found for: PROLACTIN Lab Results  Component Value Date   CHOL 104 05/22/2023   TRIG 162.0 (H) 05/22/2023   HDL 45.90 05/22/2023   CHOLHDL 2 05/22/2023   VLDL 32.4 05/22/2023   LDLCALC 26 05/22/2023   LDLCALC 37 07/25/2022   Lab Results  Component Value Date   TSH 1.98 05/22/2023   TSH 2.663 09/07/2022    Therapeutic Level Labs: No results found for: LITHIUM Lab Results  Component Value Date   VALPROATE 31 (L) 04/07/2024    VALPROATE 22 (L) 07/27/2022   No results found for: CBMZ  Screenings:  GAD-7    Flowsheet Row Office Visit from 05/22/2023 in [facility] at [facility] Office Visit from 11/23/2022 in [facility] at [facility]  Total GAD-7 Score 0 0   Mini-Mental    Flowsheet Row Office Visit from 05/25/2022 in [facility]  Total Score (max 30 points ) 26   PHQ2-9    Flowsheet Row Clinical Support from 06/08/2023 in [facility] Care at [facility]  Point Office Visit from 05/22/2023 in [facility] at [facility] Office Visit from 11/23/2022 in [facility] Primary Care at [facility] Office Visit from 07/25/2022 in [facility] Primary Care at [facility] Clinical Support from 06/06/2022 in [facility] Primary Care at [facility]  PHQ-2 Total Score 0 0 0 2 0  PHQ-9 Total Score -- 0 0 11 --   Flowsheet Row ED from 04/02/2024 in [facility] ED from 09/07/2022 in [facility] Emergency Department at [facility] ED to Hosp-Admission (Discharged) from 07/27/2022 in [facility]ICU/STEPDOWN  C-SSRS RISK CATEGORY No Risk No Risk No Risk    Collaboration of Care: Collaboration of Care: Medication Management AEB Dr. Carvin  Patient/Guardian was advised Release of Information must be obtained prior to any record release in order to collaborate their care with an outside provider. Patient/Guardian was advised if they have not already done so to contact the registration department to sign all necessary forms in order for us  to release information regarding their care.   Consent: Patient/Guardian gives verbal consent for treatment and assignment of benefits for services provided during this visit. Patient/Guardian expressed understanding and agreed to proceed.   Janitza Revuelta, MD, PGY-3 04/28/2024, 5:25 PM

## 2024-04-28 ENCOUNTER — Encounter (HOSPITAL_COMMUNITY): Payer: Self-pay | Admitting: Psychiatry

## 2024-04-28 ENCOUNTER — Ambulatory Visit (HOSPITAL_BASED_OUTPATIENT_CLINIC_OR_DEPARTMENT_OTHER): Admitting: Psychiatry

## 2024-04-28 VITALS — BP 134/78 | HR 97 | Wt 284.8 lb

## 2024-04-28 DIAGNOSIS — Z8659 Personal history of other mental and behavioral disorders: Secondary | ICD-10-CM | POA: Diagnosis not present

## 2024-04-28 DIAGNOSIS — F209 Schizophrenia, unspecified: Secondary | ICD-10-CM

## 2024-04-28 DIAGNOSIS — F17200 Nicotine dependence, unspecified, uncomplicated: Secondary | ICD-10-CM

## 2024-04-28 MED ORDER — FLUOXETINE HCL 20 MG PO TABS: 20.0000 mg | ORAL_TABLET | Freq: Every day | ORAL | 1 refills | Status: DC

## 2024-04-28 MED ORDER — CLONAZEPAM 1 MG PO TABS: 1.0000 mg | ORAL_TABLET | Freq: Two times a day (BID) | ORAL | 0 refills | Status: DC

## 2024-04-28 MED ORDER — DIVALPROEX SODIUM ER 500 MG PO TB24: 1000.0000 mg | ORAL_TABLET | Freq: Every day | ORAL | 1 refills | Status: DC

## 2024-04-28 MED ORDER — OLANZAPINE 20 MG PO TABS: 20.0000 mg | ORAL_TABLET | Freq: Every day | ORAL | 1 refills | Status: DC

## 2024-04-28 MED ORDER — OLANZAPINE 10 MG PO TABS: 10.0000 mg | ORAL_TABLET | Freq: Every day | ORAL | 1 refills | Status: DC

## 2024-04-28 MED ORDER — OLANZAPINE 2.5 MG PO TABS: 2.5000 mg | ORAL_TABLET | Freq: Two times a day (BID) | ORAL | 1 refills | Status: DC | PRN

## 2024-04-29 NOTE — Addendum Note (Signed)
 Addended by: CARVIN CROCK on: 04/29/2024 09:29 AM   Modules accepted: Level of Service

## 2024-05-05 ENCOUNTER — Telehealth (HOSPITAL_COMMUNITY): Payer: Self-pay | Admitting: *Deleted

## 2024-05-05 NOTE — Telephone Encounter (Signed)
 Pt's father, Jayvan Mcshan, called with concerns about pt's current mental status. Father says that pt is still experiencing AVH and paranoia and is concerned his medication is not strong enough. Does not sound as though AVh are command in nature. Writer advised that pt is on Zyprexa  20 mg which is a significant dose and can have Zyprexa  2.5 mg and klonopin  0.5 mg PRN. Father continued to ask to speak to you about stronger medication. Father is listed as contact however I do not see a current ROI in chart fyi. Please review.

## 2024-05-06 ENCOUNTER — Telehealth (HOSPITAL_COMMUNITY): Payer: Self-pay | Admitting: *Deleted

## 2024-05-06 NOTE — Telephone Encounter (Signed)
 Writer received t/c from pt's father regarding medication regime. Father continues to state that pt has AVH a couple of times per week and that pt cannot sleep during these episodes. Mr. Knope asked if pt can take Tylenol  or Ibuprofen  at HS instead of the Alprazolam. Writer advised that she does not see Xanax on pt med list but that there is script for Clonazepam  1 mg @ [facility] [place]. Father stated he did not know about that script. This nurse advised that pt take BID as ordered and brief med ed provided. He does not seem to understand Zyprexa  2.5 mg to 5 mg PRN dose despite multiple attempts to educate. Mr. Schweer stated that he will probably call this nurse tomorrow. FYI.

## 2024-05-12 ENCOUNTER — Telehealth (HOSPITAL_COMMUNITY): Payer: Self-pay

## 2024-05-12 NOTE — Telephone Encounter (Signed)
 Patient's Father called in and left a voicemail. Father is concerned that patient is sleeping a lot and is there any changes in medications that can be made.

## 2024-05-14 DIAGNOSIS — E7800 Pure hypercholesterolemia, unspecified: Secondary | ICD-10-CM | POA: Diagnosis not present

## 2024-05-14 DIAGNOSIS — E119 Type 2 diabetes mellitus without complications: Secondary | ICD-10-CM | POA: Diagnosis not present

## 2024-05-14 DIAGNOSIS — I10 Essential (primary) hypertension: Secondary | ICD-10-CM | POA: Diagnosis not present

## 2024-05-20 ENCOUNTER — Telehealth (HOSPITAL_COMMUNITY): Payer: Self-pay | Admitting: *Deleted

## 2024-05-20 NOTE — Telephone Encounter (Signed)
 Writer spoke with pt's father, Damany Eastman, who called with concerns that pt is not sleeping and to clarify Olanzapine  doses and times. This was reviewed with Mr. Goza who then put his driver on the phone to explain pt bx. They describe that pt is pacing all night as well as looking at the ceiling. Writer asked if pt seems to be in pain or has said he feels stiff, rigid, especially in his neck and arms.  They answered no but described that pt can't stop looking at the ceiling. They said that the Olanzapine  2.5 mg is not doing anything when pt has an episode. Writer advised taking the Klonopin  1 mg bid  as ordered. They said that he is taking that but still having episodes which they describe as pacing, cannot sit still, fixated on the ceiling. Please review and advise.

## 2024-05-21 ENCOUNTER — Other Ambulatory Visit (HOSPITAL_COMMUNITY): Payer: Self-pay | Admitting: Psychiatry

## 2024-05-21 DIAGNOSIS — F209 Schizophrenia, unspecified: Secondary | ICD-10-CM

## 2024-05-21 MED ORDER — HYDROXYZINE HCL 25 MG PO TABS: 25.0000 mg | ORAL_TABLET | Freq: Every evening | ORAL | 0 refills | Status: DC | PRN

## 2024-05-21 NOTE — Progress Notes (Signed)
 Patient's father called the front desk regarding concerns for his olanzapine  doses and the episodes that the patient has every 5 days where he is having difficulty sleeping and having increased hallucinations. He mentions that there are days when patient is able to be calmed down with 2.5mg  PRN but then there are days when he goes up to 10mg  PRN and it doesn't work. I called back again and clarified that he could increase to 5mg  BID PRN for the zyprexa . Given the difficulties with sleep on those days even with increased zyprexa , I also sent in hydroxyzine  25mg  at bedtime PRN to help with sleep and anxiety during the episodes. He expressed appreciation for the call.

## 2024-05-22 NOTE — Telephone Encounter (Signed)
 Thank you :)

## 2024-05-30 NOTE — Progress Notes (Signed)
 BH MD Outpatient Progress Note  06/02/2024 5:36 PM GOLDMAN BIRCHALL III  MRN:  [id number]  Assessment:  Kayla JONETTA Ferries III presents for follow-up evaluation. Today, 06/02/24, patient reports that he is doing well.  He reports decrease in his hallucinations for the past 2 weeks and caretaker notes that he has only had 1 episode a week.  He reports difficulty with sleep onset for the past 2 days.  Can trial over-the-counter melatonin.  They report benefit from the hydroxyzine  as needed for anxiety. We discussed can use hydroxyzine  up to twice a day for increased anxiety.  We discussed a plan regarding as needed medications for his anxiety and when he has increased hallucinations.  We continued to titrate down on his Zyprexa  to a total of 25 mg from 30 mg.  We increased his as needed Zyprexa  from 2.5 to 5 mg.  Instructions were written down for patient's caretaker and father.  We discussed expectations regarding interim phone calls.  We discussed will not fill out the driving form and they can reach out to neurologist if they want the neurologic portion of the driving form filled out.  Plan to follow-up again in 3 weeks.  Identifying Information: SMARAN GAUS III is a 48 y.o. male with a history of schizophrenia who is an established patient with [facility] for management of medications.  Risk Assessment: An assessment of suicide and violence risk factors was performed as part of this evaluation and is not significantly changed from the last visit.             While future psychiatric events cannot be accurately predicted, the patient does not currently require acute inpatient psychiatric care and does not currently meet [facility]  involuntary commitment criteria.          Plan:  # Schizophrenia  Past medication trials: haldol, risperdal, clozaril - reports had intense pain as an allergic reaction to all medications Status of problem: ongoing Interventions: -- decrease  zyprexa  5mg  AM, 20mg  PM for auditory hallucinations, will consider additional decrease in future visits    02/2024 A1c 9.4, Lipid panel with elevated cholesterol -- increase zyprexa  5mg  BID PRN for increased auditory hallucinations -- continue depakote  1000mg  at bedtime for additional mood stabilization              03/2024 depakote  level 31  --continue klonopin  1mg  BID for additional mood stabilization, can consider decrease in future visits  --start hydroxyzine  25mg  BID PRN for insomnia, anxiety  -- Can also give over-the-counter melatonin for sleep  Instructions for increased anxiety and hallucinations:  Give hydroxyzine  25mg  PRN  Give zyprexa  2.5mg -5mg  PRN  Wait at least 4 hours prior to giving additional zyprexa  2.5-5 mg PRN  # History of depression Past medication trials: unknown Status of problem: resolved  Interventions: -- continue prozac  20mg  daily  #Tobacco use disorder --continue to encourage cessation --Declined NRT  Return to care in:  Future Appointments  Date Time Provider Department Center  06/23/2024  1:30 PM Graham Krabbe, MD [facility] None   Health maintenance:  PCP: [facility]  Medical Dx: HTN, OSA, HLD, DM  Prior Labs:  04/2023 CMP wnl. Lipid panel with elevated TG, otherwise wnl. CBC wnl. A1c wnl. TSH wnl. EKG 08/2022: sinus rhythm, Qtc 460.  Labs: CareEverywhere 03/05/2024 - CMP Cr, AST/ALT wnl. Lipid panel LDL 63, total cholesterol 865. TSH wnl. A1c 9.4. 06/2022 MRI brain wnl. 05/2022 EEG wnl.  Sleep study with OSA  08/2013  Patient was given contact information for behavioral health clinic and was instructed to call 911 for emergencies.   Patient and plan of care will be discussed with the Attending MD  who agrees with the above statement and plan.   Subjective:  Chief Complaint: No chief complaint on file.  Interval History:  Labs: CMP, CBC, UDS, PDMP PDMP klonopin  60 tabs 30 days filled 05/26/24 from Dr. Shirline Interval notes:  Received telephone call from patient's dad regarding increased somnolence, then increased times when patient is staring up at ceiling  Patient presents with father [name] and caretaker [name].  She patient reports that he feels better.  He reports that he has not had his enemy episodes.  Caretaker [name] also reports that patient has had only 1 episode a week for the past 2 weeks.  Dad reports that patient has been sleeping a lot during the day and resting in the afternoon.  They report that they feel like the hydroxyzine  has helped him relax and sleep.  They report that he sleeps between 9 PM to 6 AM.  He report no issue with appetite.  Reports that he is still vaping but is using about half than he used to.  We discussed the frequency of phone call since the last visit.  We discussed the importance of keeping a log of patient's behavior to discuss at the next visit and if there are concerns about patient's safety to bring him to the urgent care but otherwise we will attempt to address concerns of medication changes at the visit.  We discussed and wrote down instructions for as needed medications including using the hydroxyzine  first-line and then using up to 5 mg of the Zyprexa  as needed and waiting up to 4 hours before giving additional Zyprexa .  We discussed continuing to decrease the morning Zyprexa  to 5 mg.  Patient's father, patient and caretaker were in agreement with the plan.  They also brought up the need for physician input on patient's ability to drive.  The form that they provided to this provider was more consistent with a neurologic evaluation.  Discussed that they could reach out to neurologist if they wanted this portion of the form to be completed.  Visit Diagnosis:    ICD-10-CM   1. Tobacco use disorder  F17.200     2. Schizophrenia, unspecified type (HCC)  F20.9 OLANZapine  (ZYPREXA ) 5 MG tablet    OLANZapine  (ZYPREXA ) 2.5 MG tablet    3. History of depression  Z86.59        Past  Psychiatric History:  Diagnoses: alcohol abuse, schizoaffective disorder/schizophrenia Medication trials:              Current: klonopin  1mg  BID, depakote  1000mg  ER at bedtime, prozac  20mg  at bedtime, zyprexa  10mg  AM, 30mg  at bedtime zyprexa  2.5 BID PRN              Past: haldol, risperdal, clozaril - reports had intense pain as an allergic reaction, anafanil   Previous psychiatrist/therapist: Dr. Shirline 08/2023 Hospitalizations: none in chart review, seen at [facility] 03/2024 by Dr. Rollene Suicide attempts: denies  SIB: denies  Hx of violence towards others: denies  Current access to guns: denies  Hx of trauma/abuse: denies   Substance Use History:  PDMP klonopin  60 tabs 30 days filled 03/26/24, other rx for klonopin  1mg  BID UDS neg 08/2022. Prior alc 07/2022 <10, 06/2022 - 204  Alcohol: denies  Tobacco:   Vape pen every day, reports thinking about quitting  since they've stopped making his vape Cannabis: denies   Past Medical History:  Past Medical History:  Diagnosis Date   Anemia    post operative   Antiphospholipid syndrome (HCC) 08/15/2011   Anxiety    Blood transfusion without reported diagnosis    x 2 per pt    Cellulitis    bilateral thighs   Cerumen impaction 07/13/2013   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HCC)    Constipation in male    Depression    Diabetes (HCC)    no medications    Elevated BP 10/20/2012   Enlarged thyroid  03/07/2015   H/O tobacco use, presenting hazards to health 12/01/2011   Quit October 2014    Hyperlipidemia    Lupus anticoagulant positive 08/15/2011   Medicare annual wellness visit, subsequent 03/19/2016   Obesity    Schizophrenia (HCC)    Sleep apnea    wears cpap, no 02    Past Surgical History:  Procedure Laterality Date   COLONOSCOPY     eye surgery     b/l laser eye sx 2004   I & D EXTREMITY     left leg   LEG SURGERY     TONSILLECTOMY     WISDOM TOOTH EXTRACTION     Family Psychiatric History: denies   Family History:  Family History   Problem Relation Age of Onset   Diverticulosis Father    Stomach cancer Mother        stomach CA dx at 24   GER disease Mother    Cancer Mother        stomach   Colon cancer Neg Hx    Pancreatic cancer Neg Hx    Esophageal cancer Neg Hx    Colon polyps Neg Hx    Rectal cancer Neg Hx     Social History:  Academic/Vocational:  Housing: live with dad since February, prior to that was living in [place] for 20 years as well as caretaker 7 days/week.  Income: gets disability income from medical conditions  Family: dad Howdy Support: dad and caretaker Children: none  Marital Status: never married  Has daytime caretaker [name]   Access to firearms: none Medication stockpile: none, [name] helps with medications  Substance Use History:   Social History   Socioeconomic History   Marital status: Single    Spouse name: Not on file   Number of children: 0   Years of education: Not on file   Highest education level: Not on file  Occupational History    Employer: [employer]    Comment: [employer]  Tobacco Use   Smoking status: Former    Current packs/day: 0.00    Average packs/day: 1 pack/day for 8.8 years (8.8 ttl pk-yrs)    Types: Cigars, Cigarettes    Start date: 07/15/2012    Quit date: 04/25/2021    Years since quitting: 3.1   Smokeless tobacco: Former    Types: Chew   Tobacco comments:    4 cigars A DAY        Now vaping  Vaping Use   Vaping status: Every Day  Substance and Sexual Activity   Alcohol use: No   Drug use: No   Sexual activity: Not Currently  Other Topics Concern   Not on file  Social History Narrative   Lives in a Group Home - his dad pays for it - Visits with Dad every Saturday and Sunday   Social Drivers of [employer]  Strain: Low Risk  (06/08/2023)   Overall Financial Resource Strain (CARDIA)    Difficulty of Paying Living Expenses: Not hard at all  Food Insecurity: No Food Insecurity (06/08/2023)   Hunger Vital Sign     Worried About Running Out of Food in the Last Year: Never true    Ran Out of Food in the Last Year: Never true  Transportation Needs: No Transportation Needs (06/08/2023)   PRAPARE - Administrator, Civil Service (Medical): No    Lack of Transportation (Non-Medical): No  Physical Activity: Sufficiently Active (06/08/2023)   Exercise Vital Sign    Days of Exercise per Week: 7 days    Minutes of Exercise per Session: 40 min  Stress: No Stress Concern Present (06/08/2023)   [employer] of Occupational Health - Occupational Stress Questionnaire    Feeling of Stress : Not at all  Social Connections: Moderately Isolated (06/08/2023)   Social Connection and Isolation Panel    Frequency of Communication with Friends and Family: More than three times a week    Frequency of Social Gatherings with Friends and Family: Three times a week    Attends Religious Services: More than 4 times per year    Active Member of Clubs or Organizations: No    Attends Banker Meetings: Never    Marital Status: Never married    Allergies:  Allergies  Allergen Reactions   Haloperidol Other (See Comments)   Statins Other (See Comments)    ? Related to compartment syndrome   Aripiprazole Other (See Comments)   Haldol [Haloperidol Decanoate]     Painful, HA   Risperidone And Paliperidone     Risperdol per pt     Current Medications: Current Outpatient Medications  Medication Sig Dispense Refill   hydrOXYzine  (ATARAX ) 25 MG tablet Take 1 tablet (25 mg total) by mouth 2 (two) times daily as needed for anxiety. 60 tablet 0   OLANZapine  (ZYPREXA ) 2.5 MG tablet Take 1-2 tablets (2.5-5 mg total) by mouth 2 (two) times daily as needed for up to 20 days (for increased auditory hallucinations). 80 tablet 0   OLANZapine  (ZYPREXA ) 5 MG tablet Take 1 tablet (5 mg total) by mouth daily. 30 tablet 0   acetaminophen  (TYLENOL ) 325 MG tablet 1 tab po every 8 hours as needed for pain 30 tablet 0    aspirin  EC (ASPIRIN  LOW DOSE) 81 MG tablet TAKE 1 TABLET BY MOUTH DAILY 90 tablet 1   Blood Glucose Monitoring Suppl (ONETOUCH VERIO) w/Device KIT Blood Glucose Monitoring Check once daily 1 kit 0   clonazePAM  (KLONOPIN ) 1 MG tablet Take 1 tablet (1 mg total) by mouth 2 (two) times daily. 74 tablet 0   divalproex  (DEPAKOTE  ER) 500 MG 24 hr tablet Take 2 tablets (1,000 mg total) by mouth at bedtime. 60 tablet 1   docusate sodium  (COLACE) 100 MG capsule TAKE 1 CAPSULE(100 MG) BY MOUTH TWICE DAILY 60 capsule 11   ezetimibe  (ZETIA ) 10 MG tablet Take 1 tablet (10 mg total) by mouth daily. 90 tablet 0   Fiber, Corn Dextrin, POWD TAKE 1 DOSE BY MOUTH IN THE MORNING AND AT AT BEDTIME 350 g 5   FLUoxetine  (PROZAC ) 20 MG tablet Take 1 tablet (20 mg total) by mouth at bedtime. 30 tablet 1   Lancets (ONETOUCH DELICA PLUS LANCET30G) MISC 1 each by Other route daily. Glucose 90 each 3   lisinopril  (ZESTRIL ) 2.5 MG tablet Take 1 tablet (2.5 mg total) by mouth  daily. 90 tablet 0   loratadine  (CLARITIN ) 10 MG tablet TAKE 1 TABLET(10 MG) BY MOUTH DAILY 90 tablet 1   meloxicam  (MOBIC ) 15 MG tablet TAKE 1/2 TO 1 TABLET BY MOUTH EVERY DAY AS NEEDED 30 tablet 1   Multiple Vitamin (MULTIVITAMIN) TABS TAKE 1 TABLET BY MOUTH DAILY 90 tablet 0   OLANZapine  (ZYPREXA ) 20 MG tablet Take 1 tablet (20 mg total) by mouth at bedtime. 30 tablet 1   ONETOUCH VERIO test strip 1 each by Other route in the morning and at bedtime. 100 each 12   polyethylene glycol powder (GLYCOLAX /MIRALAX ) 17 GM/SCOOP powder Take 17 g by mouth daily. Mix in liquid 1530 g 1   rosuvastatin  (CRESTOR ) 10 MG tablet Take 10 mg by mouth daily.     SYNJARDY  12.01-999 MG TABS Take 1 tablet by mouth 2 (two) times daily.     Wheat Dextrin (CLEAR SOLUBLE FIBER) POWD Take 1 Dose by mouth in the morning and at bedtime. 477 g 11   No current facility-administered medications for this visit.    ROS: Review of Systems Respiratory:  Negative for shortness of  breath.   Cardiovascular:  Negative for chest pain.  Gastrointestinal:  Negative for abdominal pain, constipation, diarrhea, nausea and vomiting.  Neurological:  Negative for headaches.   Objective:  Psychiatric Specialty Exam: There were no vitals taken for this visit.There is no height or weight on file to calculate BMI.  General Appearance: Casual, wearing glasses and tshirt  Eye Contact:  Good  Speech:  Clear and Coherent  Volume:  Normal  Mood:  Euthymic  Affect:  Appropriate  Thought Content: Logical   Suicidal Thoughts:  No  Homicidal Thoughts:  No  Thought Process:  Coherent  Orientation:  Full (Time, Place, and Person)    Memory: Grossly intact   Judgment:  Intact  Insight:  Present  Concentration:  Concentration: Fair  Recall: not formally assessed   Fund of Knowledge: Fair  Language: Fair  Psychomotor Activity:  Normal  Akathisia:  No  AIMS (if indicated): not done  Assets:  Communication Skills Desire for Improvement Financial Resources/Insurance Housing Social Support Talents/Skills  ADL's:  Intact  Cognition: WNL  Sleep:  Fair   PE: General: well-appearing; no acute distress  Pulm: no increased work of breathing on room air  Strength & Muscle Tone: within normal limits Neuro: no focal neurological deficits observed  Gait & Station: normal  Metabolic Disorder Labs: Lab Results  Component Value Date   HGBA1C 9.4 03/04/2024   MPG 142.72 07/15/2022   MPG 123 (H) 12/31/2012   No results found for: PROLACTIN Lab Results  Component Value Date   CHOL 104 05/22/2023   TRIG 162.0 (H) 05/22/2023   HDL 45.90 05/22/2023   CHOLHDL 2 05/22/2023   VLDL 32.4 05/22/2023   LDLCALC 26 05/22/2023   LDLCALC 37 07/25/2022   Lab Results  Component Value Date   TSH 1.98 05/22/2023   TSH 2.663 09/07/2022    Therapeutic Level Labs: No results found for: LITHIUM Lab Results  Component Value Date   VALPROATE 31 (L) 04/07/2024   VALPROATE 22 (L)  07/27/2022   No results found for: CBMZ  Screenings:  GAD-7    Flowsheet Row Office Visit from 05/22/2023 in [facility] at [facility] Office Visit from 11/23/2022 in [facility] at [facility]  Total GAD-7 Score 0 0   Mini-Mental    Flowsheet Row Office Visit  from 05/25/2022 in [facility]  Total Score (max 30 points ) 26   PHQ2-9    Flowsheet Row Clinical Support from 06/08/2023 in [facility] Care at [facility] Office Visit from 05/22/2023 in [facility] at [facility] Office Visit from 11/23/2022 in [facility] Care at [facility] Office Visit from 07/25/2022 in [facility] at [facility] Clinical Support from 06/06/2022 in [facility] Care at [facility]  PHQ-2 Total Score 0 0 0 2 0  PHQ-9 Total Score -- 0 0 11 --   Flowsheet Row ED from 04/02/2024 in [facility] ED from 09/07/2022 in [facility] Emergency Department at [facility] ED to Hosp-Admission (Discharged) from 07/27/2022 in [facility]STE[facility]OWN  C-SSRS RISK CATEGORY No Risk No Risk No Risk    Collaboration of Care: Collaboration of Care: Medication Management AEB attending psychiatrist  Patient/Guardian was advised Release of Information must be obtained prior to any record release in order to collaborate their care with an outside provider. Patient/Guardian was advised if they have not already done so to contact the registration department to sign all necessary forms in order for us  to release information regarding their care.   Consent: Patient/Guardian gives verbal consent for treatment and assignment of benefits for services provided during this visit. Patient/Guardian expressed understanding and agreed to proceed.   Corean Minor, MD,  PGY-3 06/02/2024, 5:36 PM

## 2024-06-02 ENCOUNTER — Ambulatory Visit (HOSPITAL_BASED_OUTPATIENT_CLINIC_OR_DEPARTMENT_OTHER): Admitting: Psychiatry

## 2024-06-02 DIAGNOSIS — F209 Schizophrenia, unspecified: Secondary | ICD-10-CM

## 2024-06-02 DIAGNOSIS — F17200 Nicotine dependence, unspecified, uncomplicated: Secondary | ICD-10-CM | POA: Diagnosis not present

## 2024-06-02 DIAGNOSIS — Z8659 Personal history of other mental and behavioral disorders: Secondary | ICD-10-CM | POA: Diagnosis not present

## 2024-06-02 MED ORDER — OLANZAPINE 2.5 MG PO TABS: 2.5000 mg | ORAL_TABLET | Freq: Two times a day (BID) | ORAL | 0 refills | Status: DC | PRN

## 2024-06-02 MED ORDER — OLANZAPINE 5 MG PO TABS: 5.0000 mg | ORAL_TABLET | Freq: Every day | ORAL | 0 refills | Status: DC

## 2024-06-02 MED ORDER — HYDROXYZINE HCL 25 MG PO TABS: 25.0000 mg | ORAL_TABLET | Freq: Two times a day (BID) | ORAL | 0 refills | Status: DC | PRN

## 2024-06-03 NOTE — Addendum Note (Signed)
 Addended by: CARVIN CROCK on: 06/03/2024 08:01 AM   Modules accepted: Level of Service

## 2024-06-04 ENCOUNTER — Ambulatory Visit (HOSPITAL_COMMUNITY): Admitting: Psychiatry

## 2024-06-05 DIAGNOSIS — E7800 Pure hypercholesterolemia, unspecified: Secondary | ICD-10-CM | POA: Diagnosis not present

## 2024-06-05 DIAGNOSIS — E119 Type 2 diabetes mellitus without complications: Secondary | ICD-10-CM | POA: Diagnosis not present

## 2024-06-07 ENCOUNTER — Other Ambulatory Visit: Payer: Self-pay | Admitting: Family Medicine

## 2024-06-12 DIAGNOSIS — E7800 Pure hypercholesterolemia, unspecified: Secondary | ICD-10-CM | POA: Diagnosis not present

## 2024-06-12 DIAGNOSIS — E663 Overweight: Secondary | ICD-10-CM | POA: Diagnosis not present

## 2024-06-12 DIAGNOSIS — I10 Essential (primary) hypertension: Secondary | ICD-10-CM | POA: Diagnosis not present

## 2024-06-12 DIAGNOSIS — Z23 Encounter for immunization: Secondary | ICD-10-CM | POA: Diagnosis not present

## 2024-06-12 DIAGNOSIS — E119 Type 2 diabetes mellitus without complications: Secondary | ICD-10-CM | POA: Diagnosis not present

## 2024-06-13 DIAGNOSIS — H40053 Ocular hypertension, bilateral: Secondary | ICD-10-CM | POA: Diagnosis not present

## 2024-06-14 ENCOUNTER — Other Ambulatory Visit: Payer: Self-pay | Admitting: Family Medicine

## 2024-06-14 DIAGNOSIS — E785 Hyperlipidemia, unspecified: Secondary | ICD-10-CM

## 2024-06-16 ENCOUNTER — Ambulatory Visit: Payer: Self-pay

## 2024-06-16 NOTE — Telephone Encounter (Signed)
 Third attempt: several rings followed by this message, I'm sorry, your call cannot be completed at this time, please hang up and try your call again later  Message routed to [facility] for follow up  opied from CRM #[id number]. Topic: Clinical - Medical Advice >> Jun 16, 2024 11:04 AM Delon HERO wrote: Reason for CRM: Caregiver is calling to report that the patient that the wax in the patient's ear with dificulty earing with hearing aids in both ears. Wanting advice

## 2024-06-16 NOTE — Progress Notes (Signed)
 BH MD Outpatient Progress Note  06/23/2024 2:38 PM Joseph [name]  MRN:  [id number]  Assessment:  Joseph [name] presents for follow-up evaluation. Today, 06/23/24, patient reports increased anxiety that he is unable to determine the cause of. Patient has only had 1 episode of psychosis since his last visit with this provider.  We discussed increasing his hydroxyzine  to TID PRN for patient's anxiety. Patient and father's caretaker will be leaving the country and we set up an appointment for when she returns. Besides the increase in hydroxyzine , we discussed continuing the same medication regimen and patient, caretaker, and father were in agreement. Patient's physical [name] has improved with starting GLP-1 and associated weight loss and improvement in his A1c.   Identifying Information: Joseph [name] is a 48 y.o. male with a history of schizophrenia who is an established patient with Joseph [name] for management of medications.  Risk Assessment: An assessment of suicide and violence risk factors was performed as part of this evaluation and is not significantly changed from the last visit.             While future psychiatric events cannot be accurately predicted, the patient does not currently require acute inpatient psychiatric care and does not currently meet [place]  involuntary commitment criteria.          Plan:  # Schizophrenia  Past medication trials: haldol, risperdal, clozaril - reports had intense pain as an allergic reaction to all medications Status of problem: ongoing Interventions: --continue zyprexa  5mg  AM, 20mg  PM for auditory hallucinations, will consider additional decrease in future visits    05/2024 A1c 7.6, Lipid panel wnl, TSH wnl.  -- continue zyprexa  5mg  BID PRN for increased auditory hallucinations -- continue depakote  1000mg  at bedtime for additional mood stabilization              03/2024 depakote  level 31  --continue klonopin   1mg  BID for additional mood stabilization, will consider decrease in future visits  --increase hydroxyzine  25mg  TID PRN for insomnia, anxiety  -- Can also give over-the-counter melatonin for sleep  Instructions for increased anxiety:  Give hydroxyzine  25mg  TID PRN  Instructions for increased hallucinations:   Give hydroxyzine  PRN  Give zyprexa  2.5mg -5mg  PRN  Wait at least 4 hours prior to giving additional zyprexa  2.5-5 mg PRN  # History of depression Past medication trials: unknown Status of problem: resolved  Interventions: -- continue prozac  20mg  daily  #Tobacco use disorder --continue to encourage cessation --Declined NRT  Return to care in:  Future Appointments  Date Time Provider Department Center  07/21/2024  3:30 PM Joseph Krabbe, MD [facility] None   [name] maintenance:  PCP: Dr. [name]  Medical Dx: HTN, OSA, HLD, DM  Prior Labs:  04/2023 CMP wnl. Lipid panel with elevated TG, otherwise wnl. CBC wnl. A1c wnl. TSH wnl. EKG 08/2022: sinus rhythm, Qtc 460.  Labs: CareEverywhere 03/05/2024 - CMP Cr, AST/ALT wnl. Lipid panel LDL 63, total cholesterol 865. TSH wnl. A1c 9.4. 06/2022 MRI brain wnl. 05/2022 EEG wnl.  Sleep study with OSA 08/2013  Patient was given contact information for behavioral [name] clinic and was instructed to call 911 for emergencies.   Patient and plan of care will be discussed with the Attending MD  who agrees with the above statement and plan.   Subjective:  Chief Complaint: No chief complaint on file.  Interval History:  Labs: CMP, CBC, UDS, PDMP PDMP klonopin  60 tabs 30 days filled  05/26/24 from Dr. Shirline Interval notes: saw PCP, labs reviewed   Patient presents with father Joseph [name] and caretaker Joseph [name]. We reviewed log of episodes from last visit. We found that patient has been using the olanzapine  PRN for anxiety and not necessarily for psychotic episodes. We discussed using hydroxyzine  PRN for anxiety instead. Caretaker and  father report benefit from hydroxyzine  for patient's anxiety. On review of patient behavior log, we determined that patient only had one episode of psychosis from last visit. They report no issues with sleep and report benefit from hydroxyzine . He report no issue with appetite. Patient reports increased anxiety in the AM when he first wakes up but does not know what he feels anxious about. He reports this has been going on for the past week. Reports he had a similar episode around a year ago that resolved on its own. He reports he continues to exercise and walk 1-3 blocks a day. He reports weight loss from GLP-1 medication and improvement in his A1c.   Visit Diagnosis:    ICD-10-CM   1. Schizophrenia, unspecified type (HCC)  F20.9 clonazePAM  (KLONOPIN ) 1 MG tablet    divalproex  (DEPAKOTE  ER) 500 MG 24 hr tablet    hydrOXYzine  (ATARAX ) 25 MG tablet    OLANZapine  (ZYPREXA ) 20 MG tablet    OLANZapine  (ZYPREXA ) 2.5 MG tablet    OLANZapine  (ZYPREXA ) 5 MG tablet    FLUoxetine  (PROZAC ) 20 MG tablet    2. History of depression  Z86.59 FLUoxetine  (PROZAC ) 20 MG tablet      Past Psychiatric History:  Diagnoses: alcohol abuse, schizoaffective disorder/schizophrenia Medication trials:              Current: klonopin  1mg  BID, depakote  1000mg  ER at bedtime, prozac  20mg  at bedtime, zyprexa  10mg  AM, 30mg  at bedtime zyprexa  2.5 BID PRN              Past: haldol, risperdal, clozaril - reports had intense pain as an allergic reaction, anafanil   Previous psychiatrist/therapist: Dr. Shirline 08/2023 Hospitalizations: none in chart review, seen at [facility] 03/2024 by Dr. Rollene Suicide attempts: denies  SIB: denies  Hx of violence towards others: denies  Current access to guns: denies  Hx of trauma/abuse: denies   Substance Use History:  PDMP klonopin  60 tabs 30 days filled 03/26/24, other rx for klonopin  1mg  BID UDS neg 08/2022. Prior alc 07/2022 <10, 06/2022 - 204  Alcohol: denies  Tobacco:   Vape pen every  day, reports thinking about quitting since they've stopped making his vape Cannabis: denies   Past Medical History:  Past Medical History:  Diagnosis Date   Anemia    post operative   Antiphospholipid syndrome 08/15/2011   Anxiety    Blood transfusion without reported diagnosis    x 2 per pt    Cellulitis    bilateral thighs   Cerumen impaction 07/13/2013   Compartment syndrome    Constipation in male    Depression    Diabetes (HCC)    no medications    Elevated BP 10/20/2012   Enlarged thyroid  03/07/2015   H/O tobacco use, presenting hazards to [name] 12/01/2011   Quit October 2014    Hyperlipidemia    Lupus anticoagulant positive 08/15/2011   Medicare annual wellness visit, subsequent 03/19/2016   Obesity    Schizophrenia (HCC)    Sleep apnea    wears cpap, no 02    Past Surgical History:  Procedure Laterality Date   COLONOSCOPY     eye surgery  b/l laser eye sx 2004   I & D EXTREMITY     left leg   LEG SURGERY     TONSILLECTOMY     WISDOM TOOTH EXTRACTION     Family Psychiatric History: denies   Family History:  Family History  Problem Relation Age of Onset   Diverticulosis Father    Stomach cancer Mother        stomach CA dx at 22   GER disease Mother    Cancer Mother        stomach   Colon cancer Neg Hx    Pancreatic cancer Neg Hx    Esophageal cancer Neg Hx    Colon polyps Neg Hx    Rectal cancer Neg Hx     Social History:  Academic/Vocational:  Housing: live with dad since February, prior to that was living in [place] for 20 years as well as caretaker 7 days/week.  Income: gets disability income from medical conditions  Family: dad Howdy Support: dad and caretaker Children: none  Marital Status: never married  Has daytime caretaker [name]   Access to firearms: none Medication stockpile: none, [name] helps with medications  Substance Use History:   Social History   Socioeconomic History   Marital status: Single    Spouse name: Not on  file   Number of children: 0   Years of education: Not on file   Highest education level: Not on file  Occupational History    Employer: [employer]    Comment: [employer]  Tobacco Use   Smoking status: Former    Current packs/day: 0.00    Average packs/day: 1 pack/day for 8.8 years (8.8 ttl pk-yrs)    Types: Cigars, Cigarettes    Start date: 07/15/2012    Quit date: 04/25/2021    Years since quitting: 3.1   Smokeless tobacco: Former    Types: Chew   Tobacco comments:    4 cigars A DAY        Now vaping  Vaping Use   Vaping status: Every Day  Substance and Sexual Activity   Alcohol use: No   Drug use: No   Sexual activity: Not Currently  Other Topics Concern   Not on file  Social History Narrative   Lives in a Group Home - his dad pays for it - Visits with Dad every Saturday and Sunday   Social Drivers of [name]   Financial Resource Strain: Low Risk  (06/08/2023)   Overall Financial Resource Strain (CARDIA)    Difficulty of Paying Living Expenses: Not hard at all  Food Insecurity: No Food Insecurity (06/08/2023)   Hunger Vital Sign    Worried About Running Out of Food in the Last Year: Never true    Ran Out of Food in the Last Year: Never true  Transportation Needs: No Transportation Needs (06/08/2023)   PRAPARE - Administrator, Civil Service (Medical): No    Lack of Transportation (Non-Medical): No  Physical Activity: Sufficiently Active (06/08/2023)   Exercise Vital Sign    Days of Exercise per Week: 7 days    Minutes of Exercise per Session: 40 min  Stress: No Stress Concern Present (06/08/2023)   [employer] of Occupational [name] - Occupational Stress Questionnaire    Feeling of Stress : Not at all  Social Connections: Moderately Isolated (06/08/2023)   Social Connection and Isolation Panel    Frequency of Communication with Friends and Family: More than three times a  week    Frequency of Social Gatherings with Friends and Family: Three  times a week    Attends Religious Services: More than 4 times per year    Active Member of Clubs or Organizations: No    Attends Banker Meetings: Never    Marital Status: Never married    Allergies:  Allergies  Allergen Reactions   Haloperidol Other (See Comments)   Statins Other (See Comments)    ? Related to compartment syndrome   Aripiprazole Other (See Comments)   Haldol [Haloperidol Decanoate]     Painful, HA   Risperidone And Paliperidone     Risperdol per pt     Current Medications: Current Outpatient Medications  Medication Sig Dispense Refill   hydrOXYzine  (ATARAX ) 25 MG tablet Take 1 tablet (25 mg total) by mouth 3 (three) times daily as needed for anxiety. 90 tablet 2   acetaminophen  (TYLENOL ) 325 MG tablet 1 tab po every 8 hours as needed for pain 30 tablet 0   aspirin  EC (ASPIRIN  LOW DOSE) 81 MG tablet TAKE 1 TABLET BY MOUTH DAILY 90 tablet 1   Blood Glucose Monitoring Suppl (ONETOUCH VERIO) w/Device KIT Blood Glucose Monitoring Check once daily 1 kit 0   clonazePAM  (KLONOPIN ) 1 MG tablet Take 1 tablet (1 mg total) by mouth 2 (two) times daily. 60 tablet 0   divalproex  (DEPAKOTE  ER) 500 MG 24 hr tablet Take 2 tablets (1,000 mg total) by mouth at bedtime. 60 tablet 2   docusate sodium  (COLACE) 100 MG capsule TAKE 1 CAPSULE(100 MG) BY MOUTH TWICE DAILY 60 capsule 11   ezetimibe  (ZETIA ) 10 MG tablet TAKE 1 TABLET(10 MG) BY MOUTH DAILY 30 tablet 0   Fiber, Corn Dextrin, POWD TAKE 1 DOSE BY MOUTH IN THE MORNING AND AT AT BEDTIME 350 g 5   FLUoxetine  (PROZAC ) 20 MG tablet Take 1 tablet (20 mg total) by mouth at bedtime. 30 tablet 2   Lancets (ONETOUCH DELICA PLUS LANCET30G) MISC 1 each by Other route daily. Glucose 90 each 3   lisinopril  (ZESTRIL ) 2.5 MG tablet TAKE 1 TABLET(2.5 MG) BY MOUTH DAILY 30 tablet 0   loratadine  (CLARITIN ) 10 MG tablet TAKE 1 TABLET(10 MG) BY MOUTH DAILY 90 tablet 1   meloxicam  (MOBIC ) 15 MG tablet TAKE 1/2 TO 1 TABLET BY MOUTH EVERY  DAY AS NEEDED 30 tablet 1   Multiple Vitamin (MULTIVITAMIN) TABS TAKE 1 TABLET BY MOUTH DAILY 90 tablet 0   OLANZapine  (ZYPREXA ) 2.5 MG tablet Take 1-2 tablets (2.5-5 mg total) by mouth 2 (two) times daily as needed (for increased auditory hallucinations). 90 tablet 1   OLANZapine  (ZYPREXA ) 20 MG tablet Take 1 tablet (20 mg total) by mouth at bedtime. 30 tablet 1   OLANZapine  (ZYPREXA ) 5 MG tablet Take 1 tablet (5 mg total) by mouth daily. 30 tablet 0   ONETOUCH VERIO test strip 1 each by Other route in the morning and at bedtime. 100 each 12   polyethylene glycol powder (GLYCOLAX /MIRALAX ) 17 GM/SCOOP powder Take 17 g by mouth daily. Needs appt 238 g 0   rosuvastatin  (CRESTOR ) 10 MG tablet Take 10 mg by mouth daily.     SYNJARDY  12.01-999 MG TABS Take 1 tablet by mouth 2 (two) times daily.     Wheat Dextrin (CLEAR SOLUBLE FIBER) POWD Take 1 Dose by mouth in the morning and at bedtime. 477 g 11   No current facility-administered medications for this visit.    ROS: Review of Systems Respiratory:  Negative for shortness of breath.   Cardiovascular:  Negative for chest pain.  Gastrointestinal:  Negative for abdominal pain, constipation, diarrhea, nausea and vomiting.  Neurological:  Negative for headaches.   Objective:  Psychiatric Specialty Exam: Blood pressure 127/87, pulse 100, weight 269 lb (122 kg).Body mass index is 35.49 kg/m.  General Appearance: Casual, wearing glasses and tshirt  Eye Contact:  Good  Speech:  Clear and Coherent  Volume:  Normal  Mood:  Anxious  Affect:  Appropriate  Thought Content: Logical   Suicidal Thoughts:  No  Homicidal Thoughts:  No  Thought Process:  Coherent  Orientation:  Full (Time, Place, and Person)    Memory: Grossly intact   Judgment:  Poor  Insight:  Shallow  Concentration:  Concentration: Fair  Recall: not formally assessed   Fund of Knowledge: Fair  Language: Fair  Psychomotor Activity:  Normal  Akathisia:  No  AIMS (if  indicated): not done  Assets:  Communication Skills Desire for Improvement Financial Resources/Insurance Housing Social Support Talents/Skills  ADL's:  Intact  Cognition: WNL  Sleep:  Fair   PE: General: well-appearing; no acute distress  Pulm: no increased work of breathing on room air  Strength & Muscle Tone: within normal limits Neuro: no focal neurological deficits observed  Gait & Station: normal  Metabolic Disorder Labs: Lab Results  Component Value Date   HGBA1C 9.4 03/04/2024   MPG 142.72 07/15/2022   MPG 123 (H) 12/31/2012   No results found for: PROLACTIN Lab Results  Component Value Date   CHOL 104 05/22/2023   TRIG 162.0 (H) 05/22/2023   HDL 45.90 05/22/2023   CHOLHDL 2 05/22/2023   VLDL 32.4 05/22/2023   LDLCALC 26 05/22/2023   LDLCALC 37 07/25/2022   Lab Results  Component Value Date   TSH 1.98 05/22/2023   TSH 2.663 09/07/2022    Therapeutic Level Labs: No results found for: LITHIUM Lab Results  Component Value Date   VALPROATE 31 (L) 04/07/2024   VALPROATE 22 (L) 07/27/2022   No results found for: CBMZ  Screenings:  GAD-7    Flowsheet Row Office Visit from 05/22/2023 in [facility] at [facility] Office Visit from 11/23/2022 in [name] at [facility] [name]  Total GAD-7 Score 0 0   Mini-Mental    Flowsheet Row Office Visit from 05/25/2022 in [name]  Total Score (max 30 points ) 26   PHQ2-9    Flowsheet Row Clinical Support from 06/08/2023 in [facility] at [facility] Office Visit from 05/22/2023 in [facility] at [facility] Office Visit from 11/23/2022 in [facility] at [facility] Office Visit from 07/25/2022 in [facility] at [facility] Clinical Support from 06/06/2022 in [facility] Primary Care at [facility]  PHQ-2 Total Score 0 0 0 2 0  PHQ-9 Total Score -- 0 0 11 --   Flowsheet Row ED from 04/02/2024 in [facility] ED from 09/07/2022 in [facility] Emergency Department at [facility] ED to Hosp-Admission (Discharged) from 07/27/2022 in [facility]STEPDOWN  C-SSRS RISK CATEGORY No Risk No Risk No Risk    Collaboration of Care: Collaboration of Care: Medication Management AEB attending psychiatrist  Patient/Guardian was advised Release of Information must be obtained prior to any record release in order to collaborate their care  with an outside provider. Patient/Guardian was advised if they have not already done so to contact the registration department to sign all necessary forms in order for us  to release information regarding their care.   Consent: Patient/Guardian gives verbal consent for treatment and assignment of benefits for services provided during this visit. Patient/Guardian expressed understanding and agreed to proceed.   Corean Minor, MD, PGY-3 06/23/2024, 2:38 PM

## 2024-06-16 NOTE — Telephone Encounter (Signed)
 This RN made second attempt to triage patient. No answer, unable to LVM. Routing for additional attempts.

## 2024-06-16 NOTE — Telephone Encounter (Signed)
 First attempt: several rings followed by this message, I'm sorry, your call cannot be completed at this time, please hang up and try your call again later  Copied from CRM #[id number]. Topic: Clinical - Medical Advice >> Jun 16, 2024 11:04 AM Delon HERO wrote: Reason for CRM: Caregiver is calling to report that the patient that the wax in the patient's ear with dificulty earing with hearing aids in both ears. Wanting advice

## 2024-06-17 NOTE — Telephone Encounter (Addendum)
 Tried Radiation protection practitioner at [phone] but no answer and no voicemail.  Pt can make an appt to get ear looked at.

## 2024-06-20 ENCOUNTER — Encounter: Payer: Self-pay | Admitting: *Deleted

## 2024-06-20 NOTE — Telephone Encounter (Signed)
 Unable to reach letter sent

## 2024-06-23 ENCOUNTER — Ambulatory Visit (HOSPITAL_COMMUNITY): Admitting: Psychiatry

## 2024-06-23 VITALS — BP 127/87 | HR 100 | Wt 269.0 lb

## 2024-06-23 DIAGNOSIS — F17200 Nicotine dependence, unspecified, uncomplicated: Secondary | ICD-10-CM | POA: Diagnosis not present

## 2024-06-23 DIAGNOSIS — Z8659 Personal history of other mental and behavioral disorders: Secondary | ICD-10-CM | POA: Diagnosis not present

## 2024-06-23 DIAGNOSIS — F209 Schizophrenia, unspecified: Secondary | ICD-10-CM | POA: Diagnosis not present

## 2024-06-23 MED ORDER — OLANZAPINE 20 MG PO TABS: 20.0000 mg | ORAL_TABLET | Freq: Every day | ORAL | 1 refills | Status: DC

## 2024-06-23 MED ORDER — HYDROXYZINE HCL 25 MG PO TABS: 25.0000 mg | ORAL_TABLET | Freq: Three times a day (TID) | ORAL | 2 refills | Status: DC | PRN

## 2024-06-23 MED ORDER — OLANZAPINE 2.5 MG PO TABS: 2.5000 mg | ORAL_TABLET | Freq: Two times a day (BID) | ORAL | 1 refills | Status: DC | PRN

## 2024-06-23 MED ORDER — OLANZAPINE 5 MG PO TABS: 5.0000 mg | ORAL_TABLET | Freq: Every day | ORAL | 0 refills | Status: DC

## 2024-06-23 MED ORDER — FLUOXETINE HCL 20 MG PO TABS: 20.0000 mg | ORAL_TABLET | Freq: Every day | ORAL | 2 refills | Status: DC

## 2024-06-23 MED ORDER — DIVALPROEX SODIUM ER 500 MG PO TB24: 1000.0000 mg | ORAL_TABLET | Freq: Every day | ORAL | 2 refills | Status: DC

## 2024-06-23 MED ORDER — CLONAZEPAM 1 MG PO TABS: 1.0000 mg | ORAL_TABLET | Freq: Two times a day (BID) | ORAL | 0 refills | Status: DC

## 2024-06-23 NOTE — Addendum Note (Signed)
 Addended by: CARVIN CROCK on: 06/23/2024 03:55 PM   Modules accepted: Level of Service

## 2024-07-17 NOTE — Progress Notes (Signed)
 BH MD Outpatient Progress Note  07/21/2024 4:26 PM Joseph [name]  MRN:  [id number]  Assessment:  Joseph [name] presents for follow-up evaluation with caretaker [name]. Today, 07/21/24, patient reports decreased frequency of his voices. Patient has had 2 episode of psychosis since his last visit with this provider which resolved with zyprexa  and hydroxyzine . Caregiver reports that his voices are mainly in the afternoon and possibly correlated with increase in caffeine. We discussed continuing to work on decreasing his zyprexa  and stopping his AM dose. Will continue to monitor for increased frequency of the voices. Otherwise we will continue same medication regimen. AIMS score 0 at visit today. Patient had recent fall after falling asleep at the toilet in the middle of the night, encouraged to follow-up with PCP regarding residual rib pain. Patient also noted to have some drooling at the end of visit today, will continue to monitor.   Identifying Information: Joseph [name] is a 48 y.o. male with a history of schizophrenia who is an established patient with [facility] for management of medications.  Risk Assessment: An assessment of suicide and violence risk factors was performed as part of this evaluation and is not significantly changed from the last visit.             While future psychiatric events cannot be accurately predicted, the patient does not currently require acute inpatient psychiatric care and does not currently meet [facility]  involuntary commitment criteria.          Plan:  # Schizophrenia  Past medication trials: haldol, risperdal, clozaril - reports had intense pain as an allergic reaction to all medications Status of problem: ongoing Interventions: --stop zyprexa  5mg  AM, continue zyprexa  20mg  PM for auditory hallucinations  05/2024 A1c 7.6, Lipid panel wnl, TSH wnl.  -- continue zyprexa  5mg  BID PRN for increased auditory hallucinations --  continue depakote  1000mg  at bedtime for additional mood stabilization              03/2024 depakote  level 31  --continue klonopin  1mg  BID for additional mood stabilization, will consider decrease in future visits  --continue hydroxyzine  25mg  TID PRN for insomnia, anxiety  -- Can also give over-the-counter melatonin for sleep  Instructions for increased anxiety:  Give hydroxyzine  25mg  TID PRN  Instructions for increased hallucinations:   Give hydroxyzine  PRN  Give zyprexa  2.5mg -5mg  PRN  Wait at least 4 hours prior to giving additional zyprexa  2.5-5 mg PRN  # History of depression Past medication trials: unknown Status of problem: resolved  Interventions: -- continue prozac  20mg  daily  #Tobacco use disorder --continue to encourage cessation --Declined NRT  Return to care in:  Future Appointments  Date Time Provider Department Center  07/23/2024  1:20 PM Wheeler Harlene CROME, NP [id number] [id number]   Health maintenance:  PCP: Dr. Andree Wooster [facility]  Medical Dx: HTN, OSA, HLD, DM  Prior Labs:  04/2023 CMP wnl. Lipid panel with elevated TG, otherwise wnl. CBC wnl. A1c wnl. TSH wnl. EKG 08/2022: sinus rhythm, Qtc 460.  Labs: CareEverywhere 03/05/2024 - CMP Cr, AST/ALT wnl. Lipid panel LDL 63, total cholesterol 865. TSH wnl. A1c 9.4. 06/2022 MRI brain wnl. 05/2022 EEG wnl.  Sleep study with OSA 08/2013  Patient was given contact information for behavioral health clinic and was instructed to call 911 for emergencies.   Patient and plan of care will be discussed with the Attending MD  who agrees with the above statement and plan.  Subjective:  Chief Complaint: No chief complaint on file.  Interval History:  PDMP klonopin  60 tabs 30 days filled 06/25/24 from this provider  Patient presents with caretaker [name]. Had an episode last Saturday, had one of the hydroxyzine  and then one of the zyprexa , then took the bedtime pills and felt better. Reports using the hydroxyzine  maybe  once every 2 or 3 weeks. He reports that he has felt better over the past week or 2, reports that the voices have decreased, reports not as frequent. [name] reports the voices occur more likely after lunch. He has been reading comic books, he also started working with a community peer once a week. Patient reports mood is great. Patient reports going to bed around 8pm, then lying there until 11pm, getting up around 10am. Discussed if he is unable to fall asleep to try to walk around to decrease his association with bed and wakefulness. Patient reports good appetite. Patient reports stressors include in the past when hearing voices. Patient also recently fell in the middle of the night after falling asleep on toilet. Discussed follow-up with PCP regarding residual rib pain. Patient reports adherence with medications. Patient reports no side effects. Patient reports continued substance use with vaping. Continued to encourage cessation. Patient denies SI/HI. Reports last time heard voice was earlier today, reports stating about God.   Visit Diagnosis:    ICD-10-CM   1. Schizophrenia, unspecified type (HCC)  F20.9 clonazePAM  (KLONOPIN ) 1 MG tablet    2. History of depression  Z86.59     3. Tobacco use disorder  F17.200        Past Psychiatric History:  Diagnoses: alcohol abuse, schizoaffective disorder/schizophrenia Medication trials:              Current: klonopin  1mg  BID, depakote  1000mg  ER at bedtime, prozac  20mg  at bedtime, zyprexa  10mg  AM, 30mg  at bedtime zyprexa  2.5 BID PRN              Past: haldol, risperdal, clozaril - reports had intense pain as an allergic reaction, anafanil   Previous psychiatrist/therapist: Dr. Shirline 08/2023 Hospitalizations: none in chart review, seen at [facility] 03/2024 by Dr. Rollene Suicide attempts: denies  SIB: denies  Hx of violence towards others: denies  Current access to guns: denies  Hx of trauma/abuse: denies   Substance Use History:  PDMP klonopin  60 tabs 30  days filled 03/26/24, other rx for klonopin  1mg  BID UDS neg 08/2022. Prior alc 07/2022 <10, 06/2022 - 204  Alcohol: denies  Tobacco:   Vape pen every day, reports thinking about quitting since they've stopped making his vape Cannabis: denies   Past Medical History:  Past Medical History:  Diagnosis Date   Anemia    post operative   Antiphospholipid syndrome 08/15/2011   Anxiety    Blood transfusion without reported diagnosis    x 2 per pt    Cellulitis    bilateral thighs   Cerumen impaction 07/13/2013   Compartment syndrome    Constipation in male    Depression    Diabetes (HCC)    no medications    Elevated BP 10/20/2012   Enlarged thyroid  03/07/2015   H/O tobacco use, presenting hazards to health 12/01/2011   Quit October 2014    Hyperlipidemia    Lupus anticoagulant positive 08/15/2011   Medicare annual wellness visit, subsequent 03/19/2016   Obesity    Schizophrenia (HCC)    Sleep apnea    wears cpap, no 02    Past Surgical  History:  Procedure Laterality Date   COLONOSCOPY     eye surgery     b/l laser eye sx 2004   I & D EXTREMITY     left leg   LEG SURGERY     TONSILLECTOMY     WISDOM TOOTH EXTRACTION     Family Psychiatric History: denies   Family History:  Family History  Problem Relation Age of Onset   Diverticulosis Father    Stomach cancer Mother        stomach CA dx at 50   GER disease Mother    Cancer Mother        stomach   Colon cancer Neg Hx    Pancreatic cancer Neg Hx    Esophageal cancer Neg Hx    Colon polyps Neg Hx    Rectal cancer Neg Hx     Social History:  Academic/Vocational:  Housing: live with dad since February, prior to that was living in [place] for 20 years as well as caretaker 7 days/week.  Income: gets disability income from medical conditions  Family: dad Howdy Support: dad and caretaker Children: none  Marital Status: never married  Has daytime caretaker [name]   Access to firearms: none Medication stockpile: none,  [name] helps with medications  Substance Use History:   Social History   Socioeconomic History   Marital status: Single    Spouse name: Not on file   Number of children: 0   Years of education: Not on file   Highest education level: Not on file  Occupational History    Employer: [employer]    Comment: [employer]  Tobacco Use   Smoking status: Former    Current packs/day: 0.00    Average packs/day: 1 pack/day for 8.8 years (8.8 ttl pk-yrs)    Types: Cigars, Cigarettes    Start date: 07/15/2012    Quit date: 04/25/2021    Years since quitting: 3.2   Smokeless tobacco: Former    Types: Chew   Tobacco comments:    4 cigars A DAY        Now vaping  Vaping Use   Vaping status: Every Day  Substance and Sexual Activity   Alcohol use: No   Drug use: No   Sexual activity: Not Currently  Other Topics Concern   Not on file  Social History Narrative   Lives in a Group Home - his dad pays for it - Visits with Dad every Saturday and Sunday   Social Drivers of Health   Financial Resource Strain: Low Risk  (06/08/2023)   Overall Financial Resource Strain (CARDIA)    Difficulty of Paying Living Expenses: Not hard at all  Food Insecurity: No Food Insecurity (06/08/2023)   Hunger Vital Sign    Worried About Running Out of Food in the Last Year: Never true    Ran Out of Food in the Last Year: Never true  Transportation Needs: No Transportation Needs (06/08/2023)   PRAPARE - Administrator, Civil Service (Medical): No    Lack of Transportation (Non-Medical): No  Physical Activity: Sufficiently Active (06/08/2023)   Exercise Vital Sign    Days of Exercise per Week: 7 days    Minutes of Exercise per Session: 40 min  Stress: No Stress Concern Present (06/08/2023)   [employer] of Occupational Health - Occupational Stress Questionnaire    Feeling of Stress : Not at all  Social Connections: Moderately Isolated (06/08/2023)   Social Connection  and Isolation Panel     Frequency of Communication with Friends and Family: More than three times a week    Frequency of Social Gatherings with Friends and Family: Three times a week    Attends Religious Services: More than 4 times per year    Active Member of Clubs or Organizations: No    Attends Banker Meetings: Never    Marital Status: Never married    Allergies:  Allergies  Allergen Reactions   Haloperidol Other (See Comments)   Statins Other (See Comments)    ? Related to compartment syndrome   Aripiprazole Other (See Comments)   Haldol [Haloperidol Decanoate]     Painful, HA   Risperidone And Paliperidone     Risperdol per pt     Current Medications: Current Outpatient Medications  Medication Sig Dispense Refill   acetaminophen  (TYLENOL ) 325 MG tablet 1 tab po every 8 hours as needed for pain 30 tablet 0   aspirin  EC (ASPIRIN  LOW DOSE) 81 MG tablet Take 1 tablet (81 mg total) by mouth daily. 90 tablet 0   Blood Glucose Monitoring Suppl (ONETOUCH VERIO) w/Device KIT Blood Glucose Monitoring Check once daily 1 kit 0   clonazePAM  (KLONOPIN ) 1 MG tablet Take 1 tablet (1 mg total) by mouth 2 (two) times daily. 60 tablet 0   divalproex  (DEPAKOTE  ER) 500 MG 24 hr tablet Take 2 tablets (1,000 mg total) by mouth at bedtime. 60 tablet 2   docusate sodium  (COLACE) 100 MG capsule TAKE 1 CAPSULE(100 MG) BY MOUTH TWICE DAILY 60 capsule 11   ezetimibe  (ZETIA ) 10 MG tablet TAKE 1 TABLET(10 MG) BY MOUTH DAILY 30 tablet 0   Fiber, Corn Dextrin, POWD TAKE 1 DOSE BY MOUTH IN THE MORNING AND AT AT BEDTIME 350 g 5   FLUoxetine  (PROZAC ) 20 MG tablet Take 1 tablet (20 mg total) by mouth at bedtime. 30 tablet 2   hydrOXYzine  (ATARAX ) 25 MG tablet Take 1 tablet (25 mg total) by mouth 3 (three) times daily as needed for anxiety. 90 tablet 2   Lancets (ONETOUCH DELICA PLUS LANCET30G) MISC 1 each by Other route daily. Glucose 90 each 3   lisinopril  (ZESTRIL ) 2.5 MG tablet TAKE 1 TABLET(2.5 MG) BY MOUTH DAILY 30  tablet 0   loratadine  (CLARITIN ) 10 MG tablet TAKE 1 TABLET(10 MG) BY MOUTH DAILY 90 tablet 1   meloxicam  (MOBIC ) 15 MG tablet TAKE 1/2 TO 1 TABLET BY MOUTH EVERY DAY AS NEEDED 30 tablet 1   Multiple Vitamin (MULTIVITAMIN) TABS Take 1 tablet by mouth daily. 90 tablet 0   OLANZapine  (ZYPREXA ) 2.5 MG tablet Take 1-2 tablets (2.5-5 mg total) by mouth 2 (two) times daily as needed (for increased auditory hallucinations). 90 tablet 1   OLANZapine  (ZYPREXA ) 20 MG tablet Take 1 tablet (20 mg total) by mouth at bedtime. 30 tablet 1   ONETOUCH VERIO test strip 1 each by Other route in the morning and at bedtime. 100 each 12   polyethylene glycol powder (GLYCOLAX /MIRALAX ) 17 GM/SCOOP powder Take 17 g by mouth daily. Needs appt 238 g 0   rosuvastatin  (CRESTOR ) 10 MG tablet Take 10 mg by mouth daily.     SYNJARDY  12.01-999 MG TABS Take 1 tablet by mouth 2 (two) times daily.     Wheat Dextrin (CLEAR SOLUBLE FIBER) POWD Take 1 Dose by mouth in the morning and at bedtime. 477 g 11   No current facility-administered medications for this visit.    ROS: Review of  Systems Respiratory:  Negative for shortness of breath.   Cardiovascular:  Negative for chest pain.  Gastrointestinal:  Negative for abdominal pain, constipation, diarrhea, nausea and vomiting.  Neurological:  Negative for headaches.  MSK: positive for soreness around mid-sternum  Objective:  Psychiatric Specialty Exam: There were no vitals taken for this visit.There is no height or weight on file to calculate BMI.  General Appearance: Casual, wearing glasses and tshirt  Eye Contact:  Good  Speech:  Clear and Coherent  Volume:  Normal  Mood:  Doing better  Affect:  Appropriate  Thought Content: Logical, has intermittent voices    Suicidal Thoughts:  No  Homicidal Thoughts:  No  Thought Process:  Coherent  Orientation:  Full (Time, Place, and Person)    Memory: Grossly intact   Judgment:  Poor  Insight:  Shallow  Concentration:   Concentration: Fair  Recall: not formally assessed   Fund of Knowledge: Fair  Language: Fair  Psychomotor Activity:  Normal  Akathisia:  No  AIMS (if indicated): 0 (06/2024)  Assets:  Communication Skills Desire for Improvement Financial Resources/Insurance Housing Social Support Talents/Skills  ADL's:  Intact  Cognition: WNL  Sleep:  Fair   PE: General: well-appearing; no acute distress  Pulm: no increased work of breathing on room air  Strength & Muscle Tone: within normal limits Neuro: no focal neurological deficits observed  Gait & Station: normal  Metabolic Disorder Labs: Lab Results  Component Value Date   HGBA1C 9.4 03/04/2024   MPG 142.72 07/15/2022   MPG 123 (H) 12/31/2012   No results found for: PROLACTIN Lab Results  Component Value Date   CHOL 104 05/22/2023   TRIG 162.0 (H) 05/22/2023   HDL 45.90 05/22/2023   CHOLHDL 2 05/22/2023   VLDL 32.4 05/22/2023   LDLCALC 26 05/22/2023   LDLCALC 37 07/25/2022   Lab Results  Component Value Date   TSH 1.98 05/22/2023   TSH 2.663 09/07/2022    Therapeutic Level Labs: No results found for: LITHIUM Lab Results  Component Value Date   VALPROATE 31 (L) 04/07/2024   VALPROATE 22 (L) 07/27/2022   No results found for: CBMZ  Screenings:  GAD-7    Flowsheet Row Office Visit from 05/22/2023 in [facility] at [facility] Office Visit from 11/23/2022 in [facility] at [facility]  Total GAD-7 Score 0 0   Mini-Mental    Flowsheet Row Office Visit from 05/25/2022 in [facility]  Total Score (max 30 points ) 26   PHQ2-9    Flowsheet Row Clinical Support from 06/08/2023 in [facility] at [facility] Office Visit from 05/22/2023 in [facility] Care at [facility] Office Visit from 11/23/2022 in [facility] Care at [facility] Office Visit from  07/25/2022 in [facility] Primary Care at [facility] Clinical Support from 06/06/2022 in [facility] Primary Care at [facility]  PHQ-2 Total Score 0 0 0 2 0  PHQ-9 Total Score -- 0 0 11 --   Flowsheet Row ED from 04/02/2024 in [facility] ED from 09/07/2022 in [facility] Emergency Department at [facility] ED to Hosp-Admission (Discharged) from 07/27/2022 in [facility]STE[facility]  C-SSRS RISK CATEGORY No Risk No Risk No Risk    Collaboration of Care: Collaboration of Care: Medication Management AEB attending psychiatrist  Patient/Guardian was advised  Release of Information must be obtained prior to any record release in order to collaborate their care with an outside provider. Patient/Guardian was advised if they have not already done so to contact the registration department to sign all necessary forms in order for us  to release information regarding their care.   Consent: Patient/Guardian gives verbal consent for treatment and assignment of benefits for services provided during this visit. Patient/Guardian expressed understanding and agreed to proceed.   Corean Minor, MD, PGY-3 07/21/2024, 4:26 PM

## 2024-07-21 ENCOUNTER — Ambulatory Visit (HOSPITAL_COMMUNITY): Admitting: Psychiatry

## 2024-07-21 ENCOUNTER — Telehealth: Payer: Self-pay | Admitting: Family Medicine

## 2024-07-21 DIAGNOSIS — F209 Schizophrenia, unspecified: Secondary | ICD-10-CM | POA: Diagnosis not present

## 2024-07-21 DIAGNOSIS — F17200 Nicotine dependence, unspecified, uncomplicated: Secondary | ICD-10-CM

## 2024-07-21 DIAGNOSIS — Z8659 Personal history of other mental and behavioral disorders: Secondary | ICD-10-CM | POA: Diagnosis not present

## 2024-07-21 MED ORDER — CLONAZEPAM 1 MG PO TABS: 1.0000 mg | ORAL_TABLET | Freq: Two times a day (BID) | ORAL | 0 refills | Status: DC

## 2024-07-21 NOTE — Addendum Note (Signed)
 Addended by: CARVIN CROCK on: 07/21/2024 04:40 PM   Modules accepted: Level of Service

## 2024-07-21 NOTE — Telephone Encounter (Signed)
Appt scheduled for this Thursday.

## 2024-07-21 NOTE — Telephone Encounter (Signed)
 Overdue for appt. Thersia- will you try and call Pt to get him scheduled w/ Harlene GRADE please?

## 2024-07-22 ENCOUNTER — Encounter: Payer: Self-pay | Admitting: Student

## 2024-07-22 NOTE — Progress Notes (Deleted)
 Subjective:     Patient ID: Joseph [name], male    DOB: 1976-06-11, 48 y.o.   MRN: [id number]  No chief complaint on file.   HPI  Discussed the use of AI scribe software for clinical note transcription with the patient, who gave verbal consent to proceed.   Lives in group home setting  Follows with: Behavioral health, endocrinology  Schizophrenia, depression-follows with behavioral health, Dr. Stephanie Chien MD Medications: Olanzapine ; Depakote ; Klonopin ; fluoxetine  20 mg HS; hydroxyzine  25 mg as needed;  HTN-rosuvastatin  10 mg daily; lisinopril  2.5 mg daily  HLD-Zetia  10 mg daily  DM Type 2- Follow with endocrinology- who?? Synjardy  12.5- 100 mg BID (SLGT2 and metformin )  Patient denies fever, chills, SOB, CP, palpitations, dyspnea, edema, HA, vision changes, N/V/D, abdominal pain, urinary symptoms, rash, weight changes, and recent illness or hospitalizations.   History of Present Illness              Health Maintenance Due  Topic Date Due   Hepatitis B Vaccines 19-59 Average Risk (1 of 3 - 19+ 3-dose series) Never done   Pneumococcal Vaccine: 50+ Years (2 of 2 - PCV) 11/03/2012   Diabetic kidney evaluation - Urine ACR  07/03/2014   Zoster Vaccines- Shingrix  (1 of 2) Never done   Diabetic kidney evaluation - eGFR measurement  05/21/2024   Medicare Annual Wellness (AWV)  06/07/2024    Past Medical History:  Diagnosis Date   Anemia    post operative   Antiphospholipid syndrome 08/15/2011   Anxiety    Blood transfusion without reported diagnosis    x 2 per pt    Cellulitis    bilateral thighs   Cerumen impaction 07/13/2013   Compartment syndrome    Constipation in male    Depression    Diabetes (HCC)    no medications    Elevated BP 10/20/2012   Enlarged thyroid  03/07/2015   H/O tobacco use, presenting hazards to health 12/01/2011   Quit October 2014    Hyperlipidemia    Lupus anticoagulant positive 08/15/2011   Medicare annual wellness visit,  subsequent 03/19/2016   Obesity    Schizophrenia (HCC)    Sleep apnea    wears cpap, no 02   Type 2 diabetes mellitus (HCC)     Past Surgical History:  Procedure Laterality Date   COLONOSCOPY     eye surgery     b/l laser eye sx 2004   I & D EXTREMITY     left leg   LEG SURGERY     TONSILLECTOMY     WISDOM TOOTH EXTRACTION      Family History  Problem Relation Age of Onset   Diverticulosis Father    Stomach cancer Mother        stomach CA dx at 45   GER disease Mother    Cancer Mother        stomach   Colon cancer Neg Hx    Pancreatic cancer Neg Hx    Esophageal cancer Neg Hx    Colon polyps Neg Hx    Rectal cancer Neg Hx     Social History   Socioeconomic History   Marital status: Single    Spouse name: Not on file   Number of children: 0   Years of education: Not on file   Highest education level: Not on file  Occupational History    Employer: [employer]    Comment: [employer]  Tobacco Use  Smoking status: Former    Current packs/day: 0.00    Average packs/day: 1 pack/day for 8.8 years (8.8 ttl pk-yrs)    Types: Cigars, Cigarettes    Start date: 07/15/2012    Quit date: 04/25/2021    Years since quitting: 3.2   Smokeless tobacco: Former    Types: Chew   Tobacco comments:    4 cigars A DAY        Now vaping  Vaping Use   Vaping status: Every Day  Substance and Sexual Activity   Alcohol use: No   Drug use: No   Sexual activity: Not Currently  Other Topics Concern   Not on file  Social History Narrative   Lives in a Group Home - his dad pays for it - Visits with Dad every Saturday and Sunday   Social Drivers of Health   Financial Resource Strain: Low Risk  (06/08/2023)   Overall Financial Resource Strain (CARDIA)    Difficulty of Paying Living Expenses: Not hard at all  Food Insecurity: No Food Insecurity (06/08/2023)   Hunger Vital Sign    Worried About Running Out of Food in the Last Year: Never true    Ran Out of Food in the Last  Year: Never true  Transportation Needs: No Transportation Needs (06/08/2023)   PRAPARE - Administrator, Civil Service (Medical): No    Lack of Transportation (Non-Medical): No  Physical Activity: Sufficiently Active (06/08/2023)   Exercise Vital Sign    Days of Exercise per Week: 7 days    Minutes of Exercise per Session: 40 min  Stress: No Stress Concern Present (06/08/2023)   [employer] of Occupational Health - Occupational Stress Questionnaire    Feeling of Stress : Not at all  Social Connections: Moderately Isolated (06/08/2023)   Social Connection and Isolation Panel    Frequency of Communication with Friends and Family: More than three times a week    Frequency of Social Gatherings with Friends and Family: Three times a week    Attends Religious Services: More than 4 times per year    Active Member of Clubs or Organizations: No    Attends Banker Meetings: Never    Marital Status: Never married  Intimate Partner Violence: Not At Risk (06/08/2023)   Humiliation, Afraid, Rape, and Kick questionnaire    Fear of Current or Ex-Partner: No    Emotionally Abused: No    Physically Abused: No    Sexually Abused: No    Outpatient Medications Prior to Visit  Medication Sig Dispense Refill   acetaminophen  (TYLENOL ) 325 MG tablet 1 tab po every 8 hours as needed for pain 30 tablet 0   aspirin  EC (ASPIRIN  LOW DOSE) 81 MG tablet Take 1 tablet (81 mg total) by mouth daily. 90 tablet 0   Blood Glucose Monitoring Suppl (ONETOUCH VERIO) w/Device KIT Blood Glucose Monitoring Check once daily 1 kit 0   clonazePAM  (KLONOPIN ) 1 MG tablet Take 1 tablet (1 mg total) by mouth 2 (two) times daily. 60 tablet 0   divalproex  (DEPAKOTE  ER) 500 MG 24 hr tablet Take 2 tablets (1,000 mg total) by mouth at bedtime. 60 tablet 2   docusate sodium  (COLACE) 100 MG capsule TAKE 1 CAPSULE(100 MG) BY MOUTH TWICE DAILY 60 capsule 11   ezetimibe  (ZETIA ) 10 MG tablet TAKE 1 TABLET(10 MG) BY  MOUTH DAILY 30 tablet 0   Fiber, Corn Dextrin, POWD TAKE 1 DOSE BY MOUTH IN THE MORNING AND AT  AT BEDTIME 350 g 5   FLUoxetine  (PROZAC ) 20 MG tablet Take 1 tablet (20 mg total) by mouth at bedtime. 30 tablet 2   hydrOXYzine  (ATARAX ) 25 MG tablet Take 1 tablet (25 mg total) by mouth 3 (three) times daily as needed for anxiety. 90 tablet 2   Lancets (ONETOUCH DELICA PLUS LANCET30G) MISC 1 each by Other route daily. Glucose 90 each 3   lisinopril  (ZESTRIL ) 2.5 MG tablet TAKE 1 TABLET(2.5 MG) BY MOUTH DAILY 30 tablet 0   loratadine  (CLARITIN ) 10 MG tablet TAKE 1 TABLET(10 MG) BY MOUTH DAILY 90 tablet 1   meloxicam  (MOBIC ) 15 MG tablet TAKE 1/2 TO 1 TABLET BY MOUTH EVERY DAY AS NEEDED 30 tablet 1   Multiple Vitamin (MULTIVITAMIN) TABS Take 1 tablet by mouth daily. 90 tablet 0   OLANZapine  (ZYPREXA ) 2.5 MG tablet Take 1-2 tablets (2.5-5 mg total) by mouth 2 (two) times daily as needed (for increased auditory hallucinations). 90 tablet 1   OLANZapine  (ZYPREXA ) 20 MG tablet Take 1 tablet (20 mg total) by mouth at bedtime. 30 tablet 1   ONETOUCH VERIO test strip 1 each by Other route in the morning and at bedtime. 100 each 12   polyethylene glycol powder (GLYCOLAX /MIRALAX ) 17 GM/SCOOP powder Take 17 g by mouth daily. Needs appt 238 g 0   rosuvastatin  (CRESTOR ) 10 MG tablet Take 10 mg by mouth daily.     SYNJARDY  12.01-999 MG TABS Take 1 tablet by mouth 2 (two) times daily.     Wheat Dextrin (CLEAR SOLUBLE FIBER) POWD Take 1 Dose by mouth in the morning and at bedtime. 477 g 11   No facility-administered medications prior to visit.    Allergies  Allergen Reactions   Haloperidol Other (See Comments)   Statins Other (See Comments)    ? Related to compartment syndrome   Aripiprazole Other (See Comments)   Haldol [Haloperidol Decanoate]     Painful, HA   Risperidone And Paliperidone     Risperdol per pt     ROS    See HPI Objective:    Physical Exam  General: No acute distress. Awake and  conversant. +obese Eyes: Normal conjunctiva, anicteric. Round symmetric pupils.  ENT: Hearing grossly intact. No nasal discharge.  Neck: Neck is supple. No masses or thyromegaly.  Respiratory: CTAB. Respirations are non-labored. No wheezing.  Skin: Warm. No rashes or ulcers.  Psych: Alert and oriented. Cooperative, Appropriate mood and affect, Normal judgment.  CV: RRR. No murmur. No lower extremity edema.  MSK: Normal ambulation. No clubbing or cyanosis.  Neuro:  CN II-XII grossly normal.    There were no vitals taken for this visit. Wt Readings from Last 3 Encounters:  06/08/23 267 lb 12.8 oz (121.5 kg)  05/22/23 269 lb 9.6 oz (122.3 kg)  02/22/23 272 lb 3.2 oz (123.5 kg)       Assessment & Plan:   Problem List Items Addressed This Visit     HTN (hypertension)   Well controlled, no changes to meds. Encouraged heart healthy diet such as the DASH diet and exercise as tolerated.        Hyperlipidemia, mild   Tolerating Statin; Has had Hx of statin intolerances but doing well on current medications.  Encourage heart healthy diet such as MIND or DASH diet, increase exercise, avoid trans fats, simple carbohydrates and processed foods, consider a krill or fish or flaxseed oil cap daily.         Schizophrenia (HCC) - Primary  Follows with Behavioral Health, stable.      Type 2 diabetes mellitus (HCC)   hgba1c acceptable, minimize simple carbs. Increase exercise as tolerated. Continue current meds  Follows with Endocrinology.      Other Visit Diagnoses       Screening for malignant neoplasm of prostate           I am having Joseph CHARM Ferries [name] maintain his Synjardy , rosuvastatin , acetaminophen , OneTouch Delica Plus Lancet30G, OneTouch Verio, Clear Soluble Fiber, OneTouch Verio, meloxicam , Fiber (Corn Dextrin), docusate sodium , loratadine , polyethylene glycol powder, lisinopril , ezetimibe , divalproex , hydrOXYzine , OLANZapine , OLANZapine , FLUoxetine , Multivitamin, aspirin  EC,  and clonazePAM .  No orders of the defined types were placed in this encounter.

## 2024-07-22 NOTE — Assessment & Plan Note (Deleted)
 Tolerating Statin; Has had Hx of statin intolerances but doing well on current medications.  Encourage heart healthy diet such as MIND or DASH diet, increase exercise, avoid trans fats, simple carbohydrates and processed foods, consider a krill or fish or flaxseed oil cap daily.

## 2024-07-22 NOTE — Assessment & Plan Note (Deleted)
 Well controlled, no changes to meds. Encouraged heart healthy diet such as the DASH diet and exercise as tolerated.

## 2024-07-22 NOTE — Assessment & Plan Note (Deleted)
 Follows with Behavioral Health, stable.

## 2024-07-22 NOTE — Assessment & Plan Note (Deleted)
 hgba1c acceptable, minimize simple carbs. Increase exercise as tolerated. Continue current meds  Follows with Endocrinology.

## 2024-07-23 ENCOUNTER — Ambulatory Visit: Admitting: Student

## 2024-07-23 DIAGNOSIS — E785 Hyperlipidemia, unspecified: Secondary | ICD-10-CM

## 2024-07-23 DIAGNOSIS — E119 Type 2 diabetes mellitus without complications: Secondary | ICD-10-CM

## 2024-07-23 DIAGNOSIS — F209 Schizophrenia, unspecified: Secondary | ICD-10-CM

## 2024-07-23 DIAGNOSIS — I10 Essential (primary) hypertension: Secondary | ICD-10-CM

## 2024-07-23 DIAGNOSIS — Z125 Encounter for screening for malignant neoplasm of prostate: Secondary | ICD-10-CM

## 2024-07-24 ENCOUNTER — Other Ambulatory Visit (HOSPITAL_COMMUNITY): Payer: Self-pay | Admitting: Psychiatry

## 2024-07-24 ENCOUNTER — Telehealth (HOSPITAL_COMMUNITY): Payer: Self-pay | Admitting: Psychiatry

## 2024-07-24 MED ORDER — OLANZAPINE 5 MG PO TABS: 2.5000 mg | ORAL_TABLET | Freq: Two times a day (BID) | ORAL | 0 refills | Status: DC | PRN

## 2024-07-24 NOTE — Telephone Encounter (Signed)
 Received fax from pharmacy regarding drug change request and prior auth needed for olanzapine  2.5mg . Called pharmacy who stated that the insurance would not cover olanzapine  2.5mg  at the max dose of 4 times daily PRN. Discussed changing prescription to olanzapine  5mg  0.5-1 tab per day. Sent this in to pharmacy however their insurance checking system was down this morning. Pharmacist stated they would run the prescription later in the day and would reach out if issues. Reports if no issues with the olanzapine  5mg  PRN, they will cancel the 2.5mg  script. Discussed with pharmacy to only give patient one of the scripts for PRN olanzapine  medication.

## 2024-07-31 DIAGNOSIS — W19XXXA Unspecified fall, initial encounter: Secondary | ICD-10-CM | POA: Diagnosis not present

## 2024-07-31 DIAGNOSIS — R0789 Other chest pain: Secondary | ICD-10-CM | POA: Diagnosis not present

## 2024-08-08 NOTE — Progress Notes (Signed)
 BH MD Outpatient Progress Note  08/11/2024 4:38 PM AHMED INNISS [name]  MRN:  [id number]  Assessment:  [name] presents for follow-up evaluation with caretaker Joseph [name]. Today, 08/11/24, patient reports no increase in hallucinations with decrease of zyprexa  from last visit. Patient reports one episode of psychosis since his last visit with this provider which resolved with zyprexa  and hydroxyzine . Caregiver and father report that he has had episodes around once every 5 days. No medication side effects and appears to be doing well with the PRN medication. We discussed continuing his same zyprexa  dose and working on decreasing his klonopin . We discussed decreasing his klonopin  by 0.25mg  in the AM. Will monitor for increase in his anxiety with the decreased klonopin . Otherwise we will continue same medication regimen.   Identifying Information: Joseph [name] is a 48 y.o. male with a history of schizophrenia who is an established patient with [name] for management of medications.  Risk Assessment: An assessment of suicide and violence risk factors was performed as part of this evaluation and is not significantly changed from the last visit.             While future psychiatric events cannot be accurately predicted, the patient does not currently require acute inpatient psychiatric care and does not currently meet [place]  involuntary commitment criteria.          Plan:  # Schizophrenia  Past medication trials: haldol, risperdal, clozaril - reports had intense pain as an allergic reaction to all medications Status of problem: ongoing Interventions: --continue zyprexa  20mg  PM for auditory hallucinations  05/2024 A1c 7.6, Lipid panel wnl, TSH wnl.  -- continue zyprexa  5mg  BID PRN for increased auditory hallucinations -- continue depakote  1000mg  at bedtime for additional mood stabilization              03/2024 depakote  level 31  --decrease klonopin  0.75mg   AM, 1mg  QHS for additional mood stabilization, will consider further decrease in future visits  --continue hydroxyzine  25mg  TID PRN for insomnia, anxiety  -- Can also give over-the-counter melatonin for sleep  Instructions for increased anxiety:  Give hydroxyzine  25mg  TID PRN  Instructions for increased hallucinations:   Give hydroxyzine  PRN  Give zyprexa  2.5mg -5mg  PRN  Wait at least 4 hours prior to giving additional zyprexa  2.5-5 mg PRN  # History of depression Past medication trials: unknown Status of problem: resolved  Interventions: -- continue prozac  20mg  daily  #Tobacco use disorder --continue to encourage cessation --Declined NRT  Return to care in:  Future Appointments  Date Time Provider Department [name]  10/06/2024  2:30 PM Joseph Krabbe, MD [facility] None   Health maintenance:  PCP: [name]  [name] Dx: HTN, OSA, HLD, DM  Prior Labs:  04/2023 CMP wnl. Lipid panel with elevated TG, otherwise wnl. CBC wnl. A1c wnl. TSH wnl. EKG 08/2022: sinus rhythm, Qtc 460.  Labs: CareEverywhere 03/05/2024 - CMP Cr, AST/ALT wnl. Lipid panel LDL 63, total cholesterol 865. TSH wnl. A1c 9.4. 06/2022 MRI brain wnl. 05/2022 EEG wnl.  Sleep study with OSA 08/2013  Patient was given contact information for behavioral health clinic and was instructed to call 911 for emergencies.   Patient and plan of care will be discussed with the Attending MD  who agrees with the above statement and plan.   Subjective:  Chief Complaint: No chief complaint on file.  Interval History:  PDMP klonopin  60 tabs 30 days filled 07/24/24 from this provider  Patient presents with his father and caretaker Joseph [name]. Reports mood is doing well, reports two nights ago was hearing a lot of voices, hearing multiple voices saying a lot of stuff about God. He reports only one episode since the last visit, caretaker and father report that he has episodes once every 5-7 days and he will receive the  hydroxyzine , zyprexa , and hydroxyzine  and then he will improve. Reports he got the hydroxyzine  and zyprexa  and the voices went away. He has also been more active reading magazines. Patient reports getting good sleep, still not using any machine going to bed around 8:30pm and waking up around 10am.  Patient reports good appetite, reports he has lost weight with the medication. Patient reports stressors include none. Patient reports adherence with medications. Patient reports no side effects. Patient reports continued vaping as substance use. Patient denies current SI/HI/AVH.   Visit Diagnosis:    ICD-10-CM   1. Schizophrenia, unspecified type (HCC)  F20.9 FLUoxetine  (PROZAC ) 20 MG tablet    hydrOXYzine  (ATARAX ) 25 MG tablet    OLANZapine  (ZYPREXA ) 20 MG tablet    divalproex  (DEPAKOTE  ER) 500 MG 24 hr tablet    2. History of depression  Z86.59 FLUoxetine  (PROZAC ) 20 MG tablet    3. Tobacco use disorder  F17.200       Past Psychiatric History:  Diagnoses: alcohol abuse, schizoaffective disorder/schizophrenia Medication trials:              Current: klonopin  1mg  BID, depakote  1000mg  ER at bedtime, prozac  20mg  at bedtime, zyprexa  10mg  AM, 30mg  at bedtime zyprexa  2.5 BID PRN              Past: haldol, risperdal, clozaril - reports had intense pain as an allergic reaction, anafanil   Previous psychiatrist/therapist: Dr. Shirline 08/2023 Hospitalizations: none in chart review, seen at [name] 03/2024 by Joseph [name] Suicide attempts: denies  SIB: denies  Hx of violence towards others: denies  Current access to guns: denies  Hx of trauma/abuse: denies   Substance Use History:  PDMP klonopin  60 tabs 30 days filled 03/26/24, other rx for klonopin  1mg  BID UDS neg 08/2022. Prior alc 07/2022 <10, 06/2022 - 204  Alcohol: denies  Tobacco:   Vape pen every day, reports thinking about quitting since they've stopped making his vape Cannabis: denies   Past [name] History:  Past [name] History:  Diagnosis  Date   Anemia    post operative   Antiphospholipid syndrome 08/15/2011   Anxiety    Blood transfusion without reported diagnosis    x 2 per pt    Cellulitis    bilateral thighs   Cerumen impaction 07/13/2013   Compartment syndrome    Constipation in male    Depression    Diabetes (HCC)    no medications    Elevated BP 10/20/2012   Enlarged thyroid  03/07/2015   H/O tobacco use, presenting hazards to health 12/01/2011   Quit October 2014    Hyperlipidemia    Lupus anticoagulant positive 08/15/2011   Medicare annual wellness visit, subsequent 03/19/2016   Obesity    Schizophrenia (HCC)    Sleep apnea    wears cpap, no 02   Type 2 diabetes mellitus (HCC)     Past Surgical History:  Procedure Laterality Date   COLONOSCOPY     eye surgery     b/l laser eye sx 2004   I & D EXTREMITY     left leg   LEG SURGERY     TONSILLECTOMY  WISDOM TOOTH EXTRACTION     Family Psychiatric History: denies   Family History:  Family History  Problem Relation Age of Onset   Diverticulosis Father    Stomach cancer Mother        stomach CA dx at 21   GER disease Mother    Cancer Mother        stomach   Colon cancer Neg Hx    Pancreatic cancer Neg Hx    Esophageal cancer Neg Hx    Colon polyps Neg Hx    Rectal cancer Neg Hx     Social History:  Academic/Vocational:  Housing: live with dad since February, prior to that was living in [place] for 20 years as well as caretaker 7 days/week.  Income: gets disability income from [name] conditions  Family: dad Howdy Support: dad and caretaker Children: none  Marital Status: never married  Has daytime caretaker Joseph [name]   Access to firearms: none Medication stockpile: none, Joseph [name] helps with medications  Substance Use History:   Social History   Socioeconomic History   Marital status: Single    Spouse name: Not on file   Number of children: 0   Years of education: Not on file   Highest education level: Not on file   Occupational History    Employer: [employer]    Comment: [employer]  Tobacco Use   Smoking status: Former    Current packs/day: 0.00    Average packs/day: 1 pack/day for 8.8 years (8.8 ttl pk-yrs)    Types: Cigars, Cigarettes    Start date: 07/15/2012    Quit date: 04/25/2021    Years since quitting: 3.2   Smokeless tobacco: Former    Types: Chew   Tobacco comments:    4 cigars A DAY        Now vaping  Vaping Use   Vaping status: Every Day  Substance and Sexual Activity   Alcohol use: No   Drug use: No   Sexual activity: Not Currently  Other Topics Concern   Not on file  Social History Narrative   Lives in a Group Home - his dad pays for it - Visits with Dad every Saturday and Sunday   Social Drivers of Health   Financial Resource Strain: Low Risk  (06/08/2023)   Overall Financial Resource Strain (CARDIA)    Difficulty of Paying Living Expenses: Not hard at all  Food Insecurity: No Food Insecurity (06/08/2023)   Hunger Vital Sign    Worried About Running Out of Food in the Last Year: Never true    Ran Out of Food in the Last Year: Never true  Transportation Needs: No Transportation Needs (06/08/2023)   PRAPARE - Administrator, Civil Service ([name]): No    Lack of Transportation (Non-[name]): No  Physical Activity: Sufficiently Active (06/08/2023)   Exercise Vital Sign    Days of Exercise per Week: 7 days    Minutes of Exercise per Session: 40 min  Stress: No Stress Concern Present (06/08/2023)   [employer] of Occupational Health - Occupational Stress Questionnaire    Feeling of Stress : Not at all  Social Connections: Moderately Isolated (06/08/2023)   Social Connection and Isolation Panel    Frequency of Communication with Friends and Family: More than three times a week    Frequency of Social Gatherings with Friends and Family: Three times a week    Attends Religious Services: More than 4 times per year  Active Member of Clubs or  Organizations: No    Attends Banker Meetings: Never    Marital Status: Never married    Allergies:  Allergies  Allergen Reactions   Haloperidol Other (See Comments)   Statins Other (See Comments)    ? Related to compartment syndrome   Aripiprazole Other (See Comments)   Haldol [Haloperidol Decanoate]     Painful, HA   Risperidone And Paliperidone     Risperdol per pt     Current Medications: Current Outpatient Medications  Medication Sig Dispense Refill   [START ON 09/10/2024] clonazePAM  (KLONOPIN ) 0.5 MG tablet Take 1.5 tablets (0.75 mg total) by mouth daily. 45 tablet 0   [START ON 09/10/2024] clonazePAM  (KLONOPIN ) 0.5 MG tablet Take 1.5 tablets (0.75 mg total) by mouth daily. 45 tablet 0   clonazePAM  (KLONOPIN ) 1 MG tablet Take 1 tablet (1 mg total) by mouth at bedtime. 30 tablet 0   [START ON 09/10/2024] clonazePAM  (KLONOPIN ) 1 MG tablet Take 1 tablet (1 mg total) by mouth at bedtime. 30 tablet 0   acetaminophen  (TYLENOL ) 325 MG tablet 1 tab po every 8 hours as needed for pain 30 tablet 0   aspirin  EC (ASPIRIN  LOW DOSE) 81 MG tablet Take 1 tablet (81 mg total) by mouth daily. 90 tablet 0   Blood Glucose Monitoring Suppl (ONETOUCH VERIO) w/Device KIT Blood Glucose Monitoring Check once daily 1 kit 0   divalproex  (DEPAKOTE  ER) 500 MG 24 hr tablet Take 2 tablets (1,000 mg total) by mouth at bedtime. 60 tablet 2   docusate sodium  (COLACE) 100 MG capsule TAKE 1 CAPSULE(100 MG) BY MOUTH TWICE DAILY 60 capsule 11   ezetimibe  (ZETIA ) 10 MG tablet TAKE 1 TABLET(10 MG) BY MOUTH DAILY 30 tablet 0   Fiber, Corn Dextrin, POWD TAKE 1 DOSE BY MOUTH IN THE MORNING AND AT AT BEDTIME 350 g 5   FLUoxetine  (PROZAC ) 20 MG tablet Take 1 tablet (20 mg total) by mouth at bedtime. 30 tablet 2   hydrOXYzine  (ATARAX ) 25 MG tablet Take 1 tablet (25 mg total) by mouth 3 (three) times daily as needed for anxiety. 90 tablet 2   Lancets (ONETOUCH DELICA PLUS LANCET30G) MISC 1 each by Other route  daily. Glucose 90 each 3   lisinopril  (ZESTRIL ) 2.5 MG tablet TAKE 1 TABLET(2.5 MG) BY MOUTH DAILY 30 tablet 0   loratadine  (CLARITIN ) 10 MG tablet TAKE 1 TABLET(10 MG) BY MOUTH DAILY 90 tablet 1   meloxicam  (MOBIC ) 15 MG tablet TAKE 1/2 TO 1 TABLET BY MOUTH EVERY DAY AS NEEDED 30 tablet 1   Multiple Vitamin (MULTIVITAMIN) TABS Take 1 tablet by mouth daily. 90 tablet 0   OLANZapine  (ZYPREXA ) 20 MG tablet Take 1 tablet (20 mg total) by mouth at bedtime. 30 tablet 1   OLANZapine  (ZYPREXA ) 5 MG tablet Take 0.5-1 tablets (2.5-5 mg total) by mouth 2 (two) times daily as needed (for increased auditory hallucinations). 30 tablet 0   ONETOUCH VERIO test strip 1 each by Other route in the morning and at bedtime. 100 each 12   polyethylene glycol powder (GLYCOLAX /MIRALAX ) 17 GM/SCOOP powder Take 17 g by mouth daily. Needs appt 238 g 0   rosuvastatin  (CRESTOR ) 10 MG tablet Take 10 mg by mouth daily.     SYNJARDY  12.01-999 MG TABS Take 1 tablet by mouth 2 (two) times daily.     Wheat Dextrin (CLEAR SOLUBLE FIBER) POWD Take 1 Dose by mouth in the morning and at bedtime. 477 g  11   No current facility-administered medications for this visit.    ROS: Review of Systems Respiratory:  Negative for shortness of breath.   Cardiovascular:  Negative for chest pain.  Gastrointestinal:  Negative for abdominal pain, constipation, diarrhea, nausea and vomiting.  Neurological:  Negative for headaches.  MSK: positive for soreness around mid-sternum  Objective:  Psychiatric Specialty Exam: There were no vitals taken for this visit.There is no height or weight on file to calculate BMI.  General Appearance: Casual, wearing glasses and tshirt  Eye Contact:  Good  Speech:  Clear and Coherent  Volume:  Normal  Mood:  About the same  Affect:  Appropriate  Thought Content: Logical, has intermittent voices    Suicidal Thoughts:  No  Homicidal Thoughts:  No  Thought Process:  Coherent  Orientation:  Full (Time,  Place, and Person)    Memory: Grossly intact   Judgment:  Poor  Insight:  Shallow  Concentration:  Concentration: Fair  Recall: not formally assessed   Fund of Knowledge: Fair  Language: Fair  Psychomotor Activity:  Normal  Akathisia:  No  AIMS (if indicated): 0 (06/2024)  Assets:  Communication Skills Desire for Improvement Financial Resources/Insurance Housing Social Support Talents/Skills  ADL's:  Intact  Cognition: WNL  Sleep:  Fair   PE: General: well-appearing; no acute distress  Pulm: no increased work of breathing on room air  Strength & Muscle Tone: within normal limits Neuro: no focal neurological deficits observed  Gait & Station: normal  Metabolic Disorder Labs: Lab Results  Component Value Date   HGBA1C 9.4 03/04/2024   MPG 142.72 07/15/2022   MPG 123 (H) 12/31/2012   No results found for: PROLACTIN Lab Results  Component Value Date   CHOL 104 05/22/2023   TRIG 162.0 (H) 05/22/2023   HDL 45.90 05/22/2023   CHOLHDL 2 05/22/2023   VLDL 32.4 05/22/2023   LDLCALC 26 05/22/2023   LDLCALC 37 07/25/2022   Lab Results  Component Value Date   TSH 1.98 05/22/2023   TSH 2.663 09/07/2022    Therapeutic Level Labs: No results found for: LITHIUM Lab Results  Component Value Date   VALPROATE 31 (L) 04/07/2024   VALPROATE 22 (L) 07/27/2022   No results found for: CBMZ  Screenings:  GAD-7    Flowsheet Row Office Visit from 05/22/2023 [name] Office Visit from 11/23/2022 in [name]  Total GAD-7 Score 0 0   Mini-Mental    Flowsheet Row Office Visit from 05/25/2022 in [facility]  Total Score (max 30 points ) 26   PHQ2-9    Flowsheet Row Clinical Support from 06/08/2023 in [facility] Office Visit from 05/22/2023 in [name]  Office Visit from 11/23/2022 in [name] Office Visit from 07/25/2022 in [name] Clinical Support from 06/06/2022 in [facility] at [facility]  PHQ-2 Total Score 0 0 0 2 0  PHQ-9 Total Score -- 0 0 11 --   Flowsheet Row ED from 04/02/2024 in [name] ED from 09/07/2022 in [facility] at [name] to Hosp-Admission (Discharged) from 07/27/2022 in [facility]/STEPDOWN  C-SSRS RISK CATEGORY No Risk No Risk No Risk  Collaboration of Care: Collaboration of Care: Medication Management AEB attending psychiatrist  Patient/Guardian was advised Release of Information must be obtained prior to any record release in order to collaborate their care with an outside provider. Patient/Guardian was advised if they have not already done so to contact the registration department to sign all necessary forms in order for us  to release information regarding their care.   Consent: Patient/Guardian gives verbal consent for treatment and assignment of benefits for services provided during this visit. Patient/Guardian expressed understanding and agreed to proceed.   Corean Minor, MD, PGY-3 08/11/2024, 4:38 PM

## 2024-08-11 ENCOUNTER — Ambulatory Visit (HOSPITAL_BASED_OUTPATIENT_CLINIC_OR_DEPARTMENT_OTHER): Admitting: Psychiatry

## 2024-08-11 DIAGNOSIS — F17200 Nicotine dependence, unspecified, uncomplicated: Secondary | ICD-10-CM

## 2024-08-11 DIAGNOSIS — F209 Schizophrenia, unspecified: Secondary | ICD-10-CM

## 2024-08-11 DIAGNOSIS — Z8659 Personal history of other mental and behavioral disorders: Secondary | ICD-10-CM | POA: Diagnosis not present

## 2024-08-11 MED ORDER — CLONAZEPAM 0.5 MG PO TABS: 0.7500 mg | ORAL_TABLET | Freq: Every day | ORAL | 0 refills | Status: DC

## 2024-08-11 MED ORDER — CLONAZEPAM 1 MG PO TABS: 1.0000 mg | ORAL_TABLET | Freq: Every day | ORAL | 0 refills | Status: DC

## 2024-08-11 MED ORDER — DIVALPROEX SODIUM ER 500 MG PO TB24: 1000.0000 mg | ORAL_TABLET | Freq: Every day | ORAL | 2 refills | Status: DC

## 2024-08-11 MED ORDER — OLANZAPINE 20 MG PO TABS: 20.0000 mg | ORAL_TABLET | Freq: Every day | ORAL | 1 refills | Status: DC

## 2024-08-11 MED ORDER — OLANZAPINE 5 MG PO TABS: 2.5000 mg | ORAL_TABLET | Freq: Two times a day (BID) | ORAL | 0 refills | Status: DC | PRN

## 2024-08-11 MED ORDER — HYDROXYZINE HCL 25 MG PO TABS: 25.0000 mg | ORAL_TABLET | Freq: Three times a day (TID) | ORAL | 2 refills | Status: DC | PRN

## 2024-08-11 MED ORDER — FLUOXETINE HCL 20 MG PO TABS: 20.0000 mg | ORAL_TABLET | Freq: Every day | ORAL | 2 refills | Status: DC

## 2024-08-12 ENCOUNTER — Other Ambulatory Visit (HOSPITAL_COMMUNITY): Payer: Self-pay | Admitting: Psychiatry

## 2024-08-12 DIAGNOSIS — F209 Schizophrenia, unspecified: Secondary | ICD-10-CM

## 2024-08-12 NOTE — Addendum Note (Signed)
 Addended by: CARVIN CROCK on: 08/12/2024 07:59 AM   Modules accepted: Level of Service

## 2024-08-13 NOTE — Telephone Encounter (Signed)
 Patient requested a 90-day prescription from insurance.  Prescribed him once 90-day supply.

## 2024-08-20 ENCOUNTER — Telehealth (HOSPITAL_COMMUNITY): Payer: Self-pay | Admitting: *Deleted

## 2024-08-20 NOTE — Telephone Encounter (Signed)
 Thank you :)

## 2024-08-20 NOTE — Telephone Encounter (Signed)
 Pharmacy, [facility], requests clarification on Klonopin  orders. Thank you.

## 2024-08-20 NOTE — Telephone Encounter (Signed)
 Pt's father called with request to speak to you r/t pt sleeping a lot during the day, and seeming sluggish. Father appreciates you and your care and says he is not complaining by any means, just was wondering if this is possibly r/t to medication regime. He may be reached @ [phone]. Thanks.

## 2024-08-27 DIAGNOSIS — I10 Essential (primary) hypertension: Secondary | ICD-10-CM | POA: Diagnosis not present

## 2024-10-05 NOTE — Progress Notes (Unsigned)
 BH MD Outpatient Progress Note  10/06/2024 3:16 PM Joseph [name]  MRN:  [id number]  Assessment:  Joseph [name] presents for follow-up evaluation with caretaker [name]. Today, 10/06/2024, patient reports no changes with the decreased klonopin  however patient continues to report auditory hallucinations especially in the afternoon. He does not appear to be responding to internal stimuli and is able to answer questions though with limited insight during interview today. Thus, there is suspicion that increased anxiety is contributing to the hallucinations. We will continue with current PRN dosing of zyprexa  and will change the timing of his zyprexa  to 5mg  in the morning and 15mg  at bedtime. He appears to be taking additional 5mg  of zyprexa  every 6 days. No medication side effects and appears to be doing well with the PRN medication. Otherwise we will continue same medication regimen and emphasized the importance of waiting up to 4 hours prior to obtaining next dose of PRN zyprexa .   Identifying Information: Joseph [name] is a 49 y.o. male with a history of schizophrenia who is an established patient with [facility] for management of medications.  Risk Assessment: An assessment of suicide and violence risk factors was performed as part of this evaluation and is not significantly changed from the last visit.             While future psychiatric events cannot be accurately predicted, the patient does not currently require acute inpatient psychiatric care and does not currently meet [place]  involuntary commitment criteria.          Plan:  # Schizophrenia  Past medication trials: haldol, risperdal, clozaril - reports had intense pain as an allergic reaction to all medications Status of problem: ongoing Interventions: --change timing of zyprexa  5mg  AM, 15mg  PM for auditory hallucinations  05/2024 A1c 7.6, Lipid panel wnl, TSH wnl.  -- continue zyprexa  2.5mg -5 BID  PRN for increased auditory hallucinations -- continue depakote  1000mg  at bedtime for additional mood stabilization              03/2024 depakote  level 31  --continue klonopin  0.75mg  AM, 1mg  QHS for additional mood stabilization, will consider further decrease in future visits  --continue hydroxyzine  25mg  TID PRN for insomnia, anxiety  -- Can also give over-the-counter melatonin for sleep  Instructions for increased anxiety:  Give hydroxyzine  25mg  TID PRN  Instructions for increased hallucinations:   Give hydroxyzine  PRN  Give zyprexa  2.5mg -5mg  PRN  Wait at least 4 hours prior to giving additional zyprexa  2.5-5 mg PRN  # History of depression Past medication trials: unknown Status of problem: resolved  Interventions: -- continue prozac  20mg  daily  #Tobacco use disorder --continue to encourage cessation --Declined NRT  Return to care in:  Future Appointments  Date Time Provider Department Center  11/03/2024  1:00 PM Graham Krabbe, MD [facility] None   Health maintenance:  PCP: [facility]. [facility]  Medical Dx: HTN, OSA, HLD, DM  Prior Labs:  04/2023 CMP wnl. Lipid panel with elevated TG, otherwise wnl. CBC wnl. A1c wnl. TSH wnl. EKG 08/2022: sinus rhythm, Qtc 460.  Labs: CareEverywhere 03/05/2024 - CMP Cr, AST/ALT wnl. Lipid panel LDL 63, total cholesterol 865. TSH wnl. A1c 9.4. 06/2022 MRI brain wnl. 05/2022 EEG wnl.  Sleep study with OSA 08/2013  Patient was given contact information for behavioral health clinic and was instructed to call 911 for emergencies.   Patient and plan of care will be discussed with the Attending MD  who  agrees with the above statement and plan.   Subjective:  Chief Complaint: medication management  Interval History:  PDMP klonopin  1mg  60 tabs 30 days filled 09/20/24 from this provider  Patient asks about olanzapine  switching to the AM. He is taking additional 12 tabs of olanzapine  every 6 days. He reports waiting an hour between taking  an extra dose. Discussed waiting 4 hours prior to next dose. He hasn't had any severe episodes of hearing things since the last time that he saw me. He reports when he wakes up he feels really scared and then 20 min passes and he feels fine. Reports that this started around 2 months ago. He reports sometimes he has bad dreams. He reports that he will hear voices during the day, reports 3-4 times a week, reports it will usually start in the afternoon. Reports he will hear all of them at different times, reports will usually last around 1 hour but sometimes will last 2 hours. He reports the voices are less during the day compared to last visit. He hasn't noticed a change with decreased klonopin . He reports continued issues with sleep and staying up after going to bed. He reports using hydroxyzine  before going to bed, will use a few times a week and reports that it helps, reports using Zyquil and reports he has been using melatonin and reports that it is helpful. He reports continued vaping every day and will also puff on a cigar for 2 minutes. He reports no SI/HI.   Visit Diagnosis:    ICD-10-CM   1. Schizophrenia, unspecified type (HCC)  F20.9 clonazePAM  (KLONOPIN ) 1 MG tablet    clonazePAM  (KLONOPIN ) 0.5 MG tablet    divalproex  (DEPAKOTE  ER) 500 MG 24 hr tablet    FLUoxetine  (PROZAC ) 20 MG tablet    OLANZapine  (ZYPREXA ) 5 MG tablet    hydrOXYzine  (ATARAX ) 25 MG tablet    OLANZapine  (ZYPREXA ) 5 MG tablet    OLANZapine  (ZYPREXA ) 10 MG tablet    2. History of depression  Z86.59 FLUoxetine  (PROZAC ) 20 MG tablet    3. Tobacco use disorder  F17.200        Past Psychiatric History:  Diagnoses: alcohol abuse, schizoaffective disorder/schizophrenia Medication trials:              Current: klonopin  1mg  BID, depakote  1000mg  ER at bedtime, prozac  20mg  at bedtime, zyprexa  10mg  AM, 30mg  at bedtime zyprexa  2.5 BID PRN              Past: haldol, risperdal, clozaril - reports had intense pain as an allergic  reaction, anafanil   Previous psychiatrist/therapist: Dr. Shirline 08/2023 Hospitalizations: none in chart review, seen at [facility] 03/2024 by Dr. Rollene Suicide attempts: denies  SIB: denies  Hx of violence towards others: denies  Current access to guns: denies  Hx of trauma/abuse: denies   Substance Use History:  PDMP klonopin  60 tabs 30 days filled 03/26/24, other rx for klonopin  1mg  BID UDS neg 08/2022. Prior alc 07/2022 <10, 06/2022 - 204  Alcohol: denies  Tobacco:   Vape pen every day, reports thinking about quitting since they've stopped making his vape Cannabis: denies   Past Medical History:  Past Medical History:  Diagnosis Date   Anemia    post operative   Antiphospholipid syndrome 08/15/2011   Anxiety    Blood transfusion without reported diagnosis    x 2 per pt    Cellulitis    bilateral thighs   Cerumen impaction 07/13/2013   Compartment syndrome  Constipation in male    Depression    Diabetes (HCC)    no medications    Elevated BP 10/20/2012   Enlarged thyroid  03/07/2015   H/O tobacco use, presenting hazards to health 12/01/2011   Quit October 2014    Hyperlipidemia    Lupus anticoagulant positive 08/15/2011   Medicare annual wellness visit, subsequent 03/19/2016   Obesity    Schizophrenia (HCC)    Sleep apnea    wears cpap, no 02   Type 2 diabetes mellitus (HCC)     Past Surgical History:  Procedure Laterality Date   COLONOSCOPY     eye surgery     b/l laser eye sx 2004   I & D EXTREMITY     left leg   LEG SURGERY     TONSILLECTOMY     WISDOM TOOTH EXTRACTION     Family Psychiatric History: denies   Family History:  Family History  Problem Relation Age of Onset   Diverticulosis Father    Stomach cancer Mother        stomach CA dx at 57   GER disease Mother    Cancer Mother        stomach   Colon cancer Neg Hx    Pancreatic cancer Neg Hx    Esophageal cancer Neg Hx    Colon polyps Neg Hx    Rectal cancer Neg Hx     Social History:   Academic/Vocational:  Housing: live with dad since February, prior to that was living in [place] for 20 years as well as caretaker 7 days/week.  Income: gets disability income from medical conditions  Family: dad Howdy Support: dad and caretaker Children: none  Marital Status: never married  Has daytime caretaker [name]   Access to firearms: none Medication stockpile: none, [name] helps with medications  Substance Use History:   Social History   Socioeconomic History   Marital status: Single    Spouse name: Not on file   Number of children: 0   Years of education: Not on file   Highest education level: Not on file  Occupational History    Employer: [employer]    Comment: [employer]  Tobacco Use   Smoking status: Former    Current packs/day: 0.00    Average packs/day: 1 pack/day for 8.8 years (8.8 ttl pk-yrs)    Types: Cigars, Cigarettes    Start date: 07/15/2012    Quit date: 04/25/2021    Years since quitting: 3.4   Smokeless tobacco: Former    Types: Chew   Tobacco comments:    4 cigars A DAY        Now vaping  Vaping Use   Vaping status: Every Day  Substance and Sexual Activity   Alcohol use: No   Drug use: No   Sexual activity: Not Currently  Other Topics Concern   Not on file  Social History Narrative   Lives in a Group Home - his dad pays for it - Visits with Dad every Saturday and Sunday   Social Drivers of Health   Tobacco Use: Medium Risk (04/28/2024)   Patient History    Smoking Tobacco Use: Former    Smokeless Tobacco Use: Former    Passive Exposure: Not on Actuary Strain: Low Risk (06/08/2023)   Overall Financial Resource Strain (CARDIA)    Difficulty of Paying Living Expenses: Not hard at all  Food Insecurity: No Food Insecurity (06/08/2023)   Hunger Vital  Sign    Worried About Programme Researcher, Broadcasting/film/video in the Last Year: Never true    Ran Out of Food in the Last Year: Never true  Transportation Needs: No Transportation Needs  (06/08/2023)   PRAPARE - Administrator, Civil Service (Medical): No    Lack of Transportation (Non-Medical): No  Physical Activity: Sufficiently Active (06/08/2023)   Exercise Vital Sign    Days of Exercise per Week: 7 days    Minutes of Exercise per Session: 40 min  Stress: No Stress Concern Present (06/08/2023)   [employer] of Occupational Health - Occupational Stress Questionnaire    Feeling of Stress : Not at all  Social Connections: Moderately Isolated (06/08/2023)   Social Connection and Isolation Panel    Frequency of Communication with Friends and Family: More than three times a week    Frequency of Social Gatherings with Friends and Family: Three times a week    Attends Religious Services: More than 4 times per year    Active Member of Clubs or Organizations: No    Attends Banker Meetings: Never    Marital Status: Never married  Depression (PHQ2-9): Low Risk (06/08/2023)   Depression (PHQ2-9)    PHQ-2 Score: 0  Alcohol Screen: Low Risk (06/08/2023)   Alcohol Screen    Last Alcohol Screening Score (AUDIT): 0  Housing: Low Risk (06/08/2023)   Housing    Last Housing Risk Score: 0  Utilities: Not At Risk (06/08/2023)   AHC Utilities    Threatened with loss of utilities: No  Health Literacy: Adequate Health Literacy (06/08/2023)   B1300 Health Literacy    Frequency of need for help with medical instructions: Rarely    Allergies:  Allergies  Allergen Reactions   Haloperidol Other (See Comments)   Statins Other (See Comments)    ? Related to compartment syndrome   Aripiprazole Other (See Comments)   Haldol [Haloperidol Decanoate]     Painful, HA   Risperidone And Paliperidone     Risperdol per pt     Current Medications: Current Outpatient Medications  Medication Sig Dispense Refill   OLANZapine  (ZYPREXA ) 10 MG tablet Take 1.5 tablets (15 mg total) by mouth at bedtime. 45 tablet 2   OLANZapine  (ZYPREXA ) 5 MG tablet Take 1 tablet (5 mg  total) by mouth daily. 90 tablet 0   acetaminophen  (TYLENOL ) 325 MG tablet 1 tab po every 8 hours as needed for pain 30 tablet 0   aspirin  EC (ASPIRIN  LOW DOSE) 81 MG tablet Take 1 tablet (81 mg total) by mouth daily. 90 tablet 0   Blood Glucose Monitoring Suppl (ONETOUCH VERIO) w/Device KIT Blood Glucose Monitoring Check once daily 1 kit 0   clonazePAM  (KLONOPIN ) 0.5 MG tablet Take 1.5 tablets (0.75 mg total) by mouth daily. 45 tablet 0   clonazePAM  (KLONOPIN ) 1 MG tablet Take 1 tablet (1 mg total) by mouth at bedtime. 30 tablet 0   divalproex  (DEPAKOTE  ER) 500 MG 24 hr tablet Take 2 tablets (1,000 mg total) by mouth at bedtime. 180 tablet 0   docusate sodium  (COLACE) 100 MG capsule TAKE 1 CAPSULE(100 MG) BY MOUTH TWICE DAILY 60 capsule 11   ezetimibe  (ZETIA ) 10 MG tablet TAKE 1 TABLET(10 MG) BY MOUTH DAILY 30 tablet 0   Fiber, Corn Dextrin, POWD TAKE 1 DOSE BY MOUTH IN THE MORNING AND AT AT BEDTIME 350 g 5   FLUoxetine  (PROZAC ) 20 MG tablet Take 1 tablet (20 mg total) by mouth  at bedtime. 90 tablet 0   hydrOXYzine  (ATARAX ) 25 MG tablet Take 1 tablet (25 mg total) by mouth 3 (three) times daily as needed for anxiety. 90 tablet 2   Lancets (ONETOUCH DELICA PLUS LANCET30G) MISC 1 each by Other route daily. Glucose 90 each 3   lisinopril  (ZESTRIL ) 2.5 MG tablet TAKE 1 TABLET(2.5 MG) BY MOUTH DAILY 30 tablet 0   loratadine  (CLARITIN ) 10 MG tablet TAKE 1 TABLET(10 MG) BY MOUTH DAILY 90 tablet 1   meloxicam  (MOBIC ) 15 MG tablet TAKE 1/2 TO 1 TABLET BY MOUTH EVERY DAY AS NEEDED 30 tablet 1   Multiple Vitamin (MULTIVITAMIN) TABS Take 1 tablet by mouth daily. 90 tablet 0   OLANZapine  (ZYPREXA ) 5 MG tablet Take 0.5-1 tablets (2.5-5 mg total) by mouth 2 (two) times daily as needed (for increased auditory hallucinations). 60 tablet 0   ONETOUCH VERIO test strip 1 each by Other route in the morning and at bedtime. 100 each 12   polyethylene glycol powder (GLYCOLAX /MIRALAX ) 17 GM/SCOOP powder Take 17 g by  mouth daily. Needs appt 238 g 0   rosuvastatin  (CRESTOR ) 10 MG tablet Take 10 mg by mouth daily.     SYNJARDY  12.01-999 MG TABS Take 1 tablet by mouth 2 (two) times daily.     Wheat Dextrin (CLEAR SOLUBLE FIBER) POWD Take 1 Dose by mouth in the morning and at bedtime. 477 g 11   No current facility-administered medications for this visit.    ROS: Review of Systems Respiratory:  Negative for shortness of breath.   Cardiovascular:  Negative for chest pain.  Gastrointestinal:  Negative for abdominal pain, constipation, diarrhea, nausea and vomiting.  Neurological:  Negative for headaches.  MSK: positive for soreness around mid-sternum  Objective:  Psychiatric Specialty Exam: There were no vitals taken for this visit.There is no height or weight on file to calculate BMI.  General Appearance: Casual, wearing glasses and tshirt  Eye Contact:  Good  Speech:  Clear and Coherent  Volume:  Normal  Mood:  Feeling more anxious during the day  Affect:  Appropriate  Thought Content: Logical, has intermittent voices    Suicidal Thoughts:  No  Homicidal Thoughts:  No  Thought Process:  Coherent  Orientation:  Full (Time, Place, and Person)    Memory: Grossly intact   Judgment:  Poor  Insight:  Shallow  Concentration:  Concentration: Fair  Recall: not formally assessed   Fund of Knowledge: Fair  Language: Fair  Psychomotor Activity:  Normal  Akathisia:  No  AIMS (if indicated): 0 (06/2024)  Assets:  Communication Skills Desire for Improvement Financial Resources/Insurance Housing Social Support Talents/Skills  ADL's:  Intact  Cognition: WNL  Sleep:  Fair   PE: General: well-appearing; no acute distress  Pulm: no increased work of breathing on room air  Strength & Muscle Tone: within normal limits Neuro: no focal neurological deficits observed  Gait & Station: normal  Metabolic Disorder Labs: Lab Results  Component Value Date   HGBA1C 9.4 03/04/2024   MPG 142.72  07/15/2022   MPG 123 (H) 12/31/2012   No results found for: PROLACTIN Lab Results  Component Value Date   CHOL 104 05/22/2023   TRIG 162.0 (H) 05/22/2023   HDL 45.90 05/22/2023   CHOLHDL 2 05/22/2023   VLDL 32.4 05/22/2023   LDLCALC 26 05/22/2023   LDLCALC 37 07/25/2022   Lab Results  Component Value Date   TSH 1.98 05/22/2023   TSH 2.663 09/07/2022    Therapeutic Level  Labs: No results found for: LITHIUM Lab Results  Component Value Date   VALPROATE 31 (L) 04/07/2024   VALPROATE 22 (L) 07/27/2022   No results found for: CBMZ  Screenings:  GAD-7    Flowsheet Row Office Visit from 05/22/2023 in [facility] Office Visit from 11/23/2022 in [facility] at [facility]  Total GAD-7 Score 0 0   Mini-Mental    Flowsheet Row Office Visit from 05/25/2022 in [facility]  Total Score (max 30 points ) 26   PHQ2-9    Flowsheet Row Clinical Support from 06/08/2023 in [facility] at [facility] Office Visit from 05/22/2023 in [facility] at [facility] Office Visit from 11/23/2022 in [facility] Primary Care at [facility] Office Visit from 07/25/2022 in [facility] Primary Care at [facility] Clinical Support from 06/06/2022 in [facility] at [facility]  PHQ-2 Total Score 0 0 0 2 0  PHQ-9 Total Score -- 0 0 11 --   Flowsheet Row ED from 04/02/2024 in [facility] ED from 09/07/2022 in [facility] Emergency Department at [facility] ED to Hosp-Admission (Discharged) from 07/27/2022 in [facility]ICU/STEPDOWN  C-SSRS RISK CATEGORY No Risk No Risk No Risk    Collaboration of Care: Collaboration of Care: Medication Management AEB attending psychiatrist  Patient/Guardian was advised Release of Information  must be obtained prior to any record release in order to collaborate their care with an outside provider. Patient/Guardian was advised if they have not already done so to contact the registration department to sign all necessary forms in order for us  to release information regarding their care.   Consent: Patient/Guardian gives verbal consent for treatment and assignment of benefits for services provided during this visit. Patient/Guardian expressed understanding and agreed to proceed.   Corean Minor, MD, PGY-3 10/06/2024, 3:16 PM

## 2024-10-06 ENCOUNTER — Ambulatory Visit (HOSPITAL_COMMUNITY): Admitting: Psychiatry

## 2024-10-06 DIAGNOSIS — Z8659 Personal history of other mental and behavioral disorders: Secondary | ICD-10-CM | POA: Diagnosis not present

## 2024-10-06 DIAGNOSIS — F209 Schizophrenia, unspecified: Secondary | ICD-10-CM | POA: Diagnosis not present

## 2024-10-06 DIAGNOSIS — F17200 Nicotine dependence, unspecified, uncomplicated: Secondary | ICD-10-CM

## 2024-10-06 MED ORDER — DIVALPROEX SODIUM ER 500 MG PO TB24: 1000.0000 mg | ORAL_TABLET | Freq: Every evening | ORAL | 0 refills | Status: AC

## 2024-10-06 MED ORDER — OLANZAPINE 5 MG PO TABS: 5.0000 mg | ORAL_TABLET | Freq: Every day | ORAL | 0 refills | Status: AC

## 2024-10-06 MED ORDER — CLONAZEPAM 0.5 MG PO TABS: 0.7500 mg | ORAL_TABLET | Freq: Every day | ORAL | 0 refills | Status: AC

## 2024-10-06 MED ORDER — OLANZAPINE 10 MG PO TABS: 15.0000 mg | ORAL_TABLET | Freq: Every day | ORAL | 2 refills | Status: AC

## 2024-10-06 MED ORDER — CLONAZEPAM 1 MG PO TABS: 1.0000 mg | ORAL_TABLET | Freq: Every day | ORAL | 0 refills | Status: AC

## 2024-10-06 MED ORDER — OLANZAPINE 5 MG PO TABS: 2.5000 mg | ORAL_TABLET | Freq: Two times a day (BID) | ORAL | 0 refills | Status: AC | PRN

## 2024-10-06 MED ORDER — HYDROXYZINE HCL 25 MG PO TABS: 25.0000 mg | ORAL_TABLET | Freq: Three times a day (TID) | ORAL | 2 refills | Status: AC | PRN

## 2024-10-06 MED ORDER — FLUOXETINE HCL 20 MG PO TABS: 20.0000 mg | ORAL_TABLET | Freq: Every day | ORAL | 0 refills | Status: AC

## 2024-10-07 NOTE — Addendum Note (Signed)
 Addended by: CARVIN CROCK on: 10/07/2024 07:56 AM   Modules accepted: Level of Service

## 2024-10-10 ENCOUNTER — Telehealth (HOSPITAL_COMMUNITY): Payer: Self-pay | Admitting: Psychiatry

## 2024-10-10 NOTE — Telephone Encounter (Signed)
 Received fax from [facility] for drug change request for olanzapine  15mg  at bedtime. Called pharmacy who stated that a prior auth was required for olanzapine  to be dispensed twice a day. Pharmacist states that they resent this request.

## 2024-10-22 ENCOUNTER — Other Ambulatory Visit: Payer: Self-pay | Admitting: Family Medicine

## 2024-10-24 NOTE — Progress Notes (Unsigned)
 BH MD Outpatient Progress Note  10/24/2024 11:57 AM Joseph [name]  MRN:  [id number]  Assessment:  Joseph [name] presents for follow-up evaluation with caretaker [name]. Today, 10/24/24, patient reports no changes with the decreased klonopin  however patient continues to report auditory hallucinations especially in the afternoon. He does not appear to be responding to internal stimuli and is able to answer questions though with limited insight during interview today. Thus, there is suspicion that increased anxiety is contributing to the hallucinations. We will continue with current PRN dosing of zyprexa  and will change the timing of his zyprexa  to 5mg  in the morning and 15mg  at bedtime. He appears to be taking additional 5mg  of zyprexa  every 6 days. No medication side effects and appears to be doing well with the PRN medication. Otherwise we will continue same medication regimen and emphasized the importance of waiting up to 4 hours prior to obtaining next dose of PRN zyprexa .   Identifying Information: Joseph [name] is a 48 y.o. male with a history of schizophrenia who is an established patient with [facility] for management of medications.  Risk Assessment: An assessment of suicide and violence risk factors was performed as part of this evaluation and is not significantly changed from the last visit.             While future psychiatric events cannot be accurately predicted, the patient does not currently require acute inpatient psychiatric care and does not currently meet [place]  involuntary commitment criteria.          Plan:  # Schizophrenia  Past medication trials: haldol, risperdal, clozaril - reports had intense pain as an allergic reaction to all medications Status of problem: ongoing Interventions: --change timing of zyprexa  5mg  AM, 15mg  PM for auditory hallucinations  05/2024 A1c 7.6, Lipid panel wnl, TSH wnl.  -- continue zyprexa  2.5mg -5 BID  PRN for increased auditory hallucinations -- continue depakote  1000mg  at bedtime for additional mood stabilization              03/2024 depakote  level 31  --continue klonopin  0.75mg  AM, 1mg  QHS for additional mood stabilization, will consider further decrease in future visits  --continue hydroxyzine  25mg  TID PRN for insomnia, anxiety  -- Can also give over-the-counter melatonin for sleep  Instructions for increased anxiety:  Give hydroxyzine  25mg  TID PRN  Instructions for increased hallucinations:   Give hydroxyzine  PRN  Give zyprexa  2.5mg -5mg  PRN  Wait at least 4 hours prior to giving additional zyprexa  2.5-5 mg PRN  # History of depression Past medication trials: unknown Status of problem: resolved  Interventions: -- continue prozac  20mg  daily  #Tobacco use disorder --continue to encourage cessation --Declined NRT  Return to care in:  Future Appointments  Date Time Provider Department Center  11/03/2024  1:00 PM Joseph Krabbe, MD [facility] None   Health maintenance:  PCP: Dr. [facility]  Medical Dx: HTN, OSA, HLD, DM  Prior Labs:  04/2023 CMP wnl. Lipid panel with elevated TG, otherwise wnl. CBC wnl. A1c wnl. TSH wnl. EKG 08/2022: sinus rhythm, Qtc 460.  Labs: CareEverywhere 03/05/2024 - CMP Cr, AST/ALT wnl. Lipid panel LDL 63, total cholesterol 865. TSH wnl. A1c 9.4. 06/2022 MRI brain wnl. 05/2022 EEG wnl.  Sleep study with OSA 08/2013  Patient was given contact information for behavioral health clinic and was instructed to call 911 for emergencies.   Patient and plan of care will be discussed with the Attending MD  who  agrees with the above statement and plan.   Subjective:  Chief Complaint: medication management  Interval History:  PDMP klonopin  1mg  60 tabs 30 days filled 09/20/24 from this provider  Patient asks about olanzapine  switching to the AM. He is taking additional 12 tabs of olanzapine  every 6 days. He reports waiting an hour between taking  an extra dose. Discussed waiting 4 hours prior to next dose. He hasn't had any severe episodes of hearing things since the last time that he saw me. He reports when he wakes up he feels really scared and then 20 min passes and he feels fine. Reports that this started around 2 months ago. He reports sometimes he has bad dreams. He reports that he will hear voices during the day, reports 3-4 times a week, reports it will usually start in the afternoon. Reports he will hear all of them at different times, reports will usually last around 1 hour but sometimes will last 2 hours. He reports the voices are less during the day compared to last visit. He hasn't noticed a change with decreased klonopin . He reports continued issues with sleep and staying up after going to bed. He reports using hydroxyzine  before going to bed, will use a few times a week and reports that it helps, reports using Zyquil and reports he has been using melatonin and reports that it is helpful. He reports continued vaping every day and will also puff on a cigar for 2 minutes. He reports no SI/HI.   Visit Diagnosis:  No diagnosis found.    Past Psychiatric History:  Diagnoses: alcohol abuse, schizoaffective disorder/schizophrenia Medication trials:              Current: klonopin  1mg  BID, depakote  1000mg  ER at bedtime, prozac  20mg  at bedtime, zyprexa  10mg  AM, 30mg  at bedtime zyprexa  2.5 BID PRN              Past: haldol, risperdal, clozaril - reports had intense pain as an allergic reaction, anafanil   Previous psychiatrist/therapist: Dr. Shirline 08/2023 Hospitalizations: none in chart review, seen at [facility] 03/2024 by Dr. Rollene Suicide attempts: denies  SIB: denies  Hx of violence towards others: denies  Current access to guns: denies  Hx of trauma/abuse: denies   Substance Use History:  PDMP klonopin  60 tabs 30 days filled 03/26/24, other rx for klonopin  1mg  BID UDS neg 08/2022. Prior alc 07/2022 <10, 06/2022 - 204  Alcohol: denies   Tobacco:   Vape pen every day, reports thinking about quitting since they've stopped making his vape Cannabis: denies   Past Medical History:  Past Medical History:  Diagnosis Date   Anemia    post operative   Antiphospholipid syndrome 08/15/2011   Anxiety    Blood transfusion without reported diagnosis    x 2 per pt    Cellulitis    bilateral thighs   Cerumen impaction 07/13/2013   Compartment syndrome    Constipation in male    Depression    Diabetes (HCC)    no medications    Elevated BP 10/20/2012   Enlarged thyroid  03/07/2015   H/O tobacco use, presenting hazards to health 12/01/2011   Quit October 2014    Hyperlipidemia    Lupus anticoagulant positive 08/15/2011   Medicare annual wellness visit, subsequent 03/19/2016   Obesity    Schizophrenia (HCC)    Sleep apnea    wears cpap, no 02   Type 2 diabetes mellitus (HCC)     Past Surgical History:  Procedure Laterality Date   COLONOSCOPY     eye surgery     b/l laser eye sx 2004   I & D EXTREMITY     left leg   LEG SURGERY     TONSILLECTOMY     WISDOM TOOTH EXTRACTION     Family Psychiatric History: denies   Family History:  Family History  Problem Relation Age of Onset   Diverticulosis Father    Stomach cancer Mother        stomach CA dx at 24   GER disease Mother    Cancer Mother        stomach   Colon cancer Neg Hx    Pancreatic cancer Neg Hx    Esophageal cancer Neg Hx    Colon polyps Neg Hx    Rectal cancer Neg Hx     Social History:  Academic/Vocational:  Housing: live with dad since February, prior to that was living in [place] for 20 years as well as caretaker 7 days/week.  Income: gets disability income from medical conditions  Family: dad Howdy Support: dad and caretaker Children: none  Marital Status: never married  Has daytime caretaker [name]   Access to firearms: none Medication stockpile: none, [name] helps with medications  Substance Use History:   Social History    Socioeconomic History   Marital status: Single    Spouse name: Not on file   Number of children: 0   Years of education: Not on file   Highest education level: Not on file  Occupational History    Employer: [employer]    Comment: [employer]  Tobacco Use   Smoking status: Former    Current packs/day: 0.00    Average packs/day: 1 pack/day for 8.8 years (8.8 ttl pk-yrs)    Types: Cigars, Cigarettes    Start date: 07/15/2012    Quit date: 04/25/2021    Years since quitting: 3.5   Smokeless tobacco: Former    Types: Chew   Tobacco comments:    4 cigars A DAY        Now vaping  Vaping Use   Vaping status: Every Day  Substance and Sexual Activity   Alcohol use: No   Drug use: No   Sexual activity: Not Currently  Other Topics Concern   Not on file  Social History Narrative   Lives in a Group Home - his dad pays for it - Visits with Dad every Saturday and Sunday   Social Drivers of Health   Tobacco Use: Medium Risk (04/28/2024)   Patient History    Smoking Tobacco Use: Former    Smokeless Tobacco Use: Former    Passive Exposure: Not on Actuary Strain: Low Risk (06/08/2023)   Overall Financial Resource Strain (CARDIA)    Difficulty of Paying Living Expenses: Not hard at all  Food Insecurity: No Food Insecurity (06/08/2023)   Hunger Vital Sign    Worried About Running Out of Food in the Last Year: Never true    Ran Out of Food in the Last Year: Never true  Transportation Needs: No Transportation Needs (06/08/2023)   PRAPARE - Administrator, Civil Service (Medical): No    Lack of Transportation (Non-Medical): No  Physical Activity: Sufficiently Active (06/08/2023)   Exercise Vital Sign    Days of Exercise per Week: 7 days    Minutes of Exercise per Session: 40 min  Stress: No Stress Concern Present (06/08/2023)  [employer] of Occupational Health - Occupational Stress Questionnaire    Feeling of Stress : Not at all  Social  Connections: Moderately Isolated (06/08/2023)   Social Connection and Isolation Panel    Frequency of Communication with Friends and Family: More than three times a week    Frequency of Social Gatherings with Friends and Family: Three times a week    Attends Religious Services: More than 4 times per year    Active Member of Clubs or Organizations: No    Attends Banker Meetings: Never    Marital Status: Never married  Depression (PHQ2-9): Low Risk (06/08/2023)   Depression (PHQ2-9)    PHQ-2 Score: 0  Alcohol Screen: Low Risk (06/08/2023)   Alcohol Screen    Last Alcohol Screening Score (AUDIT): 0  Housing: Low Risk (06/08/2023)   Housing    Last Housing Risk Score: 0  Utilities: Not At Risk (06/08/2023)   AHC Utilities    Threatened with loss of utilities: No  Health Literacy: Adequate Health Literacy (06/08/2023)   B1300 Health Literacy    Frequency of need for help with medical instructions: Rarely    Allergies:  Allergies  Allergen Reactions   Haloperidol Other (See Comments)   Statins Other (See Comments)    ? Related to compartment syndrome   Aripiprazole Other (See Comments)   Haldol [Haloperidol Decanoate]     Painful, HA   Risperidone And Paliperidone     Risperdol per pt     Current Medications: Current Outpatient Medications  Medication Sig Dispense Refill   acetaminophen  (TYLENOL ) 325 MG tablet 1 tab po every 8 hours as needed for pain 30 tablet 0   aspirin  EC (ASPIRIN  LOW DOSE) 81 MG tablet Take 1 tablet (81 mg total) by mouth daily. 30 tablet 0   Blood Glucose Monitoring Suppl (ONETOUCH VERIO) w/Device KIT Blood Glucose Monitoring Check once daily 1 kit 0   clonazePAM  (KLONOPIN ) 0.5 MG tablet Take 1.5 tablets (0.75 mg total) by mouth daily. 45 tablet 0   clonazePAM  (KLONOPIN ) 1 MG tablet Take 1 tablet (1 mg total) by mouth at bedtime. 30 tablet 0   divalproex  (DEPAKOTE  ER) 500 MG 24 hr tablet Take 2 tablets (1,000 mg total) by mouth at bedtime. 180  tablet 0   docusate sodium  (COLACE) 100 MG capsule TAKE 1 CAPSULE(100 MG) BY MOUTH TWICE DAILY 60 capsule 11   ezetimibe  (ZETIA ) 10 MG tablet TAKE 1 TABLET(10 MG) BY MOUTH DAILY 30 tablet 0   Fiber, Corn Dextrin, POWD TAKE 1 DOSE BY MOUTH IN THE MORNING AND AT AT BEDTIME 350 g 5   FLUoxetine  (PROZAC ) 20 MG tablet Take 1 tablet (20 mg total) by mouth at bedtime. 90 tablet 0   hydrOXYzine  (ATARAX ) 25 MG tablet Take 1 tablet (25 mg total) by mouth 3 (three) times daily as needed for anxiety. 90 tablet 2   Lancets (ONETOUCH DELICA PLUS LANCET30G) MISC 1 each by Other route daily. Glucose 90 each 3   lisinopril  (ZESTRIL ) 2.5 MG tablet TAKE 1 TABLET(2.5 MG) BY MOUTH DAILY 30 tablet 0   loratadine  (CLARITIN ) 10 MG tablet TAKE 1 TABLET(10 MG) BY MOUTH DAILY 90 tablet 1   meloxicam  (MOBIC ) 15 MG tablet TAKE 1/2 TO 1 TABLET BY MOUTH EVERY DAY AS NEEDED 30 tablet 1   Multiple Vitamin (MULTIVITAMIN) TABS Take 1 tablet by mouth daily. 30 tablet 0   OLANZapine  (ZYPREXA ) 10 MG tablet Take 1.5 tablets (15 mg total) by mouth at bedtime.  45 tablet 2   OLANZapine  (ZYPREXA ) 5 MG tablet Take 0.5-1 tablets (2.5-5 mg total) by mouth 2 (two) times daily as needed (for increased auditory hallucinations). 60 tablet 0   OLANZapine  (ZYPREXA ) 5 MG tablet Take 1 tablet (5 mg total) by mouth daily. 90 tablet 0   ONETOUCH VERIO test strip 1 each by Other route in the morning and at bedtime. 100 each 12   polyethylene glycol powder (GLYCOLAX /MIRALAX ) 17 GM/SCOOP powder Take 17 g by mouth daily. Needs appt 238 g 0   rosuvastatin  (CRESTOR ) 10 MG tablet Take 10 mg by mouth daily.     SYNJARDY  12.01-999 MG TABS Take 1 tablet by mouth 2 (two) times daily.     Wheat Dextrin (CLEAR SOLUBLE FIBER) POWD Take 1 Dose by mouth in the morning and at bedtime. 477 g 11   No current facility-administered medications for this visit.    ROS: Review of Systems Respiratory:  Negative for shortness of breath.   Cardiovascular:  Negative for  chest pain.  Gastrointestinal:  Negative for abdominal pain, constipation, diarrhea, nausea and vomiting.  Neurological:  Negative for headaches.  MSK: positive for soreness around mid-sternum  Objective:  Psychiatric Specialty Exam: There were no vitals taken for this visit.There is no height or weight on file to calculate BMI.  General Appearance: Casual, wearing glasses and tshirt  Eye Contact:  Good  Speech:  Clear and Coherent  Volume:  Normal  Mood:  Feeling more anxious during the day  Affect:  Appropriate  Thought Content: Logical, has intermittent voices    Suicidal Thoughts:  No  Homicidal Thoughts:  No  Thought Process:  Coherent  Orientation:  Full (Time, Place, and Person)    Memory: Grossly intact   Judgment:  Poor  Insight:  Shallow  Concentration:  Concentration: Fair  Recall: not formally assessed   Fund of Knowledge: Fair  Language: Fair  Psychomotor Activity:  Normal  Akathisia:  No  AIMS (if indicated): 0 (06/2024)  Assets:  Communication Skills Desire for Improvement Financial Resources/Insurance Housing Social Support Talents/Skills  ADL's:  Intact  Cognition: WNL  Sleep:  Fair   PE: General: well-appearing; no acute distress  Pulm: no increased work of breathing on room air  Strength & Muscle Tone: within normal limits Neuro: no focal neurological deficits observed  Gait & Station: normal  Metabolic Disorder Labs: Lab Results  Component Value Date   HGBA1C 9.4 03/04/2024   MPG 142.72 07/15/2022   MPG 123 (H) 12/31/2012   No results found for: PROLACTIN Lab Results  Component Value Date   CHOL 104 05/22/2023   TRIG 162.0 (H) 05/22/2023   HDL 45.90 05/22/2023   CHOLHDL 2 05/22/2023   VLDL 32.4 05/22/2023   LDLCALC 26 05/22/2023   LDLCALC 37 07/25/2022   Lab Results  Component Value Date   TSH 1.98 05/22/2023   TSH 2.663 09/07/2022    Therapeutic Level Labs: No results found for: LITHIUM Lab Results  Component Value  Date   VALPROATE 31 (L) 04/07/2024   VALPROATE 22 (L) 07/27/2022   No results found for: CBMZ  Screenings:  GAD-7    Flowsheet Row Office Visit from 05/22/2023 in [facility] at [facility] Office Visit from 11/23/2022 in [facility] at [facility]  Total GAD-7 Score 0 0   Mini-Mental    Flowsheet Row Office Visit from 05/25/2022 in [facility]  Total Score (max 30  points ) 26   PHQ2-9    Flowsheet Row Clinical Support from 06/08/2023 in [facility] at [facility] Office Visit from 05/22/2023 in [facility] at [facility] Office Visit from 11/23/2022 in [facility] Primary Care at [facility] Office Visit from 07/25/2022 in [facility] Primary Care at [facility] Clinical Support from 06/06/2022 in [facility] Primary Care at [facility]  PHQ-2 Total Score 0 0 0 2 0  PHQ-9 Total Score -- 0 0 11 --   Flowsheet Row ED from 04/02/2024 in [facility] ED from 09/07/2022 in [facility] Emergency Department at [facility] ED to Hosp-Admission (Discharged) from 07/27/2022 in [facility]  C-SSRS RISK CATEGORY No Risk No Risk No Risk    Collaboration of Care: Collaboration of Care: Medication Management AEB attending psychiatrist  Patient/Guardian was advised Release of Information must be obtained prior to any record release in order to collaborate their care with an outside provider. Patient/Guardian was advised if they have not already done so to contact the registration department to sign all necessary forms in order for us  to release information regarding their care.   Consent: Patient/Guardian gives verbal consent for treatment and assignment of benefits for services provided during this visit. Patient/Guardian expressed  understanding and agreed to proceed.   Corean Minor, MD, PGY-3 10/24/2024, 11:57 AM

## 2024-11-03 ENCOUNTER — Ambulatory Visit (HOSPITAL_COMMUNITY): Admitting: Psychiatry
